# Patient Record
Sex: Female | Born: 1960 | Race: White | Marital: Single | Smoking: Current every day smoker
Health system: Southern US, Community
[De-identification: ages and names within clinical notes are randomized; demographics above are authoritative.]

## PROBLEM LIST (undated history)

## (undated) DIAGNOSIS — I252 Old myocardial infarction: Secondary | ICD-10-CM

## (undated) DIAGNOSIS — Z720 Tobacco use: Secondary | ICD-10-CM

## (undated) DIAGNOSIS — I351 Nonrheumatic aortic (valve) insufficiency: Secondary | ICD-10-CM

## (undated) DIAGNOSIS — E079 Disorder of thyroid, unspecified: Secondary | ICD-10-CM

## (undated) DIAGNOSIS — J439 Emphysema, unspecified: Secondary | ICD-10-CM

## (undated) DIAGNOSIS — M1990 Unspecified osteoarthritis, unspecified site: Secondary | ICD-10-CM

## (undated) DIAGNOSIS — I255 Ischemic cardiomyopathy: Secondary | ICD-10-CM

## (undated) DIAGNOSIS — R51 Headache: Secondary | ICD-10-CM

## (undated) DIAGNOSIS — E785 Hyperlipidemia, unspecified: Secondary | ICD-10-CM

## (undated) DIAGNOSIS — I739 Peripheral vascular disease, unspecified: Secondary | ICD-10-CM

## (undated) DIAGNOSIS — J189 Pneumonia, unspecified organism: Secondary | ICD-10-CM

## (undated) DIAGNOSIS — C3490 Malignant neoplasm of unspecified part of unspecified bronchus or lung: Secondary | ICD-10-CM

## (undated) DIAGNOSIS — R011 Cardiac murmur, unspecified: Secondary | ICD-10-CM

## (undated) DIAGNOSIS — J449 Chronic obstructive pulmonary disease, unspecified: Secondary | ICD-10-CM

## (undated) DIAGNOSIS — I2510 Atherosclerotic heart disease of native coronary artery without angina pectoris: Principal | ICD-10-CM

## (undated) HISTORY — DX: Atherosclerotic heart disease of native coronary artery without angina pectoris: I25.10

## (undated) HISTORY — DX: Cardiac murmur, unspecified: R01.1

## (undated) HISTORY — DX: Malignant neoplasm of unspecified part of unspecified bronchus or lung: C34.90

## (undated) HISTORY — PX: CORONARY ANGIOPLASTY WITH STENT PLACEMENT: SHX49

## (undated) HISTORY — PX: CHOLECYSTECTOMY: SHX55

## (undated) HISTORY — PX: SHOULDER SURGERY: SHX246

## (undated) HISTORY — DX: Peripheral vascular disease, unspecified: I73.9

## (undated) HISTORY — DX: Headache: R51

## (undated) HISTORY — DX: Tobacco use: Z72.0

## (undated) HISTORY — DX: Ischemic cardiomyopathy: I25.5

## (undated) HISTORY — PX: TUBAL LIGATION: SHX77

## (undated) HISTORY — DX: Old myocardial infarction: I25.2

## (undated) HISTORY — DX: Hyperlipidemia, unspecified: E78.5

## (undated) HISTORY — PX: OTHER SURGICAL HISTORY: SHX169

## (undated) HISTORY — DX: Disorder of thyroid, unspecified: E07.9

## (undated) HISTORY — DX: Nonrheumatic aortic (valve) insufficiency: I35.1

---

## 1998-11-10 ENCOUNTER — Other Ambulatory Visit: Admission: RE | Admit: 1998-11-10 | Discharge: 1998-11-10 | Payer: Self-pay | Admitting: Obstetrics & Gynecology

## 1998-12-30 ENCOUNTER — Other Ambulatory Visit: Admission: RE | Admit: 1998-12-30 | Discharge: 1998-12-30 | Payer: Self-pay | Admitting: Obstetrics & Gynecology

## 1999-06-03 ENCOUNTER — Emergency Department (HOSPITAL_COMMUNITY): Admission: EM | Admit: 1999-06-03 | Discharge: 1999-06-03 | Payer: Self-pay | Admitting: Emergency Medicine

## 2004-06-09 ENCOUNTER — Ambulatory Visit (HOSPITAL_BASED_OUTPATIENT_CLINIC_OR_DEPARTMENT_OTHER): Admission: RE | Admit: 2004-06-09 | Discharge: 2004-06-09 | Payer: Self-pay | Admitting: Orthopedic Surgery

## 2005-01-19 ENCOUNTER — Ambulatory Visit (HOSPITAL_COMMUNITY): Admission: RE | Admit: 2005-01-19 | Discharge: 2005-01-19 | Payer: Self-pay | Admitting: Orthopedic Surgery

## 2005-01-19 ENCOUNTER — Ambulatory Visit (HOSPITAL_BASED_OUTPATIENT_CLINIC_OR_DEPARTMENT_OTHER): Admission: RE | Admit: 2005-01-19 | Discharge: 2005-01-19 | Payer: Self-pay | Admitting: Orthopedic Surgery

## 2006-07-13 ENCOUNTER — Encounter: Admission: RE | Admit: 2006-07-13 | Discharge: 2006-07-13 | Payer: Self-pay | Admitting: Obstetrics and Gynecology

## 2007-10-15 ENCOUNTER — Inpatient Hospital Stay (HOSPITAL_COMMUNITY): Admission: EM | Admit: 2007-10-15 | Discharge: 2007-10-21 | Payer: Self-pay | Admitting: Family Medicine

## 2007-10-15 HISTORY — PX: CARDIAC CATHETERIZATION: SHX172

## 2007-10-17 ENCOUNTER — Encounter: Payer: Self-pay | Admitting: Cardiovascular Disease

## 2007-10-17 HISTORY — PX: TRANSTHORACIC ECHOCARDIOGRAM: SHX275

## 2007-12-29 DIAGNOSIS — I252 Old myocardial infarction: Secondary | ICD-10-CM

## 2007-12-29 HISTORY — DX: Old myocardial infarction: I25.2

## 2008-01-07 HISTORY — PX: US ECHOCARDIOGRAPHY: HXRAD669

## 2009-11-09 ENCOUNTER — Inpatient Hospital Stay (HOSPITAL_COMMUNITY): Admission: EM | Admit: 2009-11-09 | Discharge: 2009-11-09 | Payer: Self-pay | Admitting: Emergency Medicine

## 2009-11-09 ENCOUNTER — Ambulatory Visit: Payer: Self-pay | Admitting: Infectious Diseases

## 2009-11-09 LAB — CONVERTED CEMR LAB
Free T4: 0.92 ng/dL
HDL: 42 mg/dL
Hgb A1c MFr Bld: 6 %
TSH: 1.814 microintl units/mL

## 2009-11-23 ENCOUNTER — Encounter: Payer: Self-pay | Admitting: Internal Medicine

## 2009-11-24 ENCOUNTER — Ambulatory Visit: Payer: Self-pay | Admitting: Internal Medicine

## 2009-11-24 DIAGNOSIS — E785 Hyperlipidemia, unspecified: Secondary | ICD-10-CM | POA: Insufficient documentation

## 2009-11-24 DIAGNOSIS — I2589 Other forms of chronic ischemic heart disease: Secondary | ICD-10-CM

## 2009-11-24 DIAGNOSIS — F17200 Nicotine dependence, unspecified, uncomplicated: Secondary | ICD-10-CM | POA: Insufficient documentation

## 2009-11-24 DIAGNOSIS — R112 Nausea with vomiting, unspecified: Secondary | ICD-10-CM | POA: Insufficient documentation

## 2009-11-24 DIAGNOSIS — I2510 Atherosclerotic heart disease of native coronary artery without angina pectoris: Secondary | ICD-10-CM | POA: Insufficient documentation

## 2009-11-24 DIAGNOSIS — I5032 Chronic diastolic (congestive) heart failure: Secondary | ICD-10-CM | POA: Insufficient documentation

## 2009-11-24 DIAGNOSIS — R5382 Chronic fatigue, unspecified: Secondary | ICD-10-CM

## 2009-11-24 HISTORY — DX: Atherosclerotic heart disease of native coronary artery without angina pectoris: I25.10

## 2009-11-26 ENCOUNTER — Telehealth: Payer: Self-pay | Admitting: Licensed Clinical Social Worker

## 2009-12-25 ENCOUNTER — Ambulatory Visit: Payer: Self-pay | Admitting: Internal Medicine

## 2009-12-25 LAB — CONVERTED CEMR LAB
ALT: 15 units/L (ref 0–35)
AST: 23 units/L (ref 0–37)
Alkaline Phosphatase: 84 units/L (ref 39–117)
HDL: 46 mg/dL (ref >39)
Total CHOL/HDL Ratio: 3.6
Total Protein: 6.7 g/dL (ref 6.0–8.3)
Triglycerides: 58 mg/dL (ref <150)

## 2010-06-29 NOTE — Miscellaneous (Signed)
Summary: PATIENT CONSENT  PATIENT CONSENT   Imported By: Louretta Parma 12/17/2009 10:28:29  _____________________________________________________________________  External Attachment:    Type:   Image     Comment:   External Document

## 2010-06-29 NOTE — Progress Notes (Signed)
Summary: Soc. Work  Nurse, children's placed by: Soc. Work Call placed to: Patient Summary of Call: Called patient to schedule an appmt for smoking cessation counseling.  The patient told me that her mother had already purchased the 21 mg nicotine patches for her to start and she plans to quit by tomorrow.   She tells me that she thinks she can do this on her own and doesn't need to come in for counseling.  I shared several tips with her, explained how the patches are usually dosed (but instructed her to read the Equate directions carefully) and encouraged her to connect with the quitline.   I advised her that counseling and the quitline would double her chance of success. The patient was not very receptive to my help and doesn't plan to call the quitline.  She thanked me for calling and told me she has my card and information.

## 2010-06-29 NOTE — Assessment & Plan Note (Signed)
Summary: new hfu-per dr vega/cfb   Vital Signs:  Patient profile:   50 year old female Height:      65.5 inches (166.37 cm) Weight:      141.2 pounds (64.18 kg) BMI:     23.22 Temp:     97.3 degrees F (36.28 degrees C) oral Pulse rate:   77 / minute BP sitting:   118 / 69  (right arm)  Vitals Entered By: Stanton Kidney Ditzler RN (November 24, 2009 3:47 PM) Is Patient Diabetic? No Pain Assessment Patient in pain? yes     Location: chest Intensity: 3 Type: sharp Onset of pain  off and on since in hospital Nutritional Status BMI of 19 -24 = normal Nutritional Status Detail appetite good  Have you ever been in a relationship where you felt threatened, hurt or afraid?denies   Does patient need assistance? Functional Status Self care Ambulation Normal Comments New pt and HFU - legs feels like water, tired and n/v.   History of Present Illness: Sharon [name] is a 50 yo woman with PMH as outlined below.  She is here for HFU for chest pain.  She reports symptoms have been present for over 1 month.  Main symptom is tired/fatigue.  She works night shift 10pm-6am.  Also having nausea for the last week, it waxes and wanes.  She has not noticed any association with activities such as eating although she has vomited after eating in light of already having nausea.    Feels depressed most days of the week, doesn't sleep well (<6 hrs), decreased appetite, decreased energy.  Has interests and enjoys games on internet, able to concentrate, no guilt or SI/HI.    All other systems reviewed and negative.     Depression History:      The patient denies a depressed mood most of the day and a diminished interest in her usual daily activities.         Preventive Screening-Counseling & Management  Alcohol-Tobacco     Smoking Status: current     Smoking Cessation Counseling: yes     Packs/Day: 1 ppd      Drug Use:  no.    Current Medications (verified): 1)  Plavix 75 Mg Tabs (Clopidogrel Bisulfate) ....  Take 1 Tablet By Mouth Once A Day 2)  Simvastatin 40 Mg Tabs (Simvastatin) .... Take 1 Tab By Mouth At Bedtime 3)  Aspir-Low 81 Mg Tbec (Aspirin) .... Take 1 Tablet By Mouth Once A Day 4)  Fish Oil 1200 Mg Caps (Omega-3 Fatty Acids) .... Take 1 Tablet By Mouth Once A Day 5)  Niacin 500 Mg Tabs (Niacin) .... Take 1 Tablet By Mouth Once A Day 6)  Vitamin B-12 1000 Mcg Tabs (Cyanocobalamin) .... Take 1 Tablet By Mouth Once A Day  Allergies (verified): 1)  ! Codeine  Past History:  Past Medical History: Ischemic Cardiomyopathy      -EF 30-40% per LVgram 09/2007      -LVF moderately reduced per echo 09/2007      -LHC 09/2007:  s/p PCI with Promus DES x 2 and monorail to proximal LAD, totally occluded prox RCA with                                     collaterals.      -Dr. Elease Hashimoto:  cardiologist (f/u 12/03/09).  Hyperlipidemia      -LDL 201 10/2009  Tobacco  use      -interested in quiting 10/2009, going to attempt with nicotine patch  Past Surgical History: Laparoscopic cholecystectomy 2007 Multiple ectopic pregnancies requiring laparotomies Tubal ligation  Family History: Mother:  alive 21yo, DM, HTN, HLD Father:  deceased 77yo, esophageal CA 2/2 EtOH Older sister:  obesity with multiple med problems younger sister:  deceased 24yo, breast CA??? 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   Head:  normocephalic and atraumatic.   Eyes:  pupils equal, pupils round, and pupils reactive to light.  anicteric Neck:  supple, no thyromegaly, no JVD, and no carotid bruits.  audible systolic murmur bilaterally Lungs:  normal respiratory effort, no accessory muscle use,  normal breath sounds, no crackles, and no wheezes.   Heart:  normal rate, regular rhythm, no gallop, no rub, no JVD, and no HJR.  There is a grade 3/6 systolic murmur over outflow tract. Abdomen:  soft, non-tender, normal bowel sounds, no distention, no masses, no guarding, no hepatomegaly, and no splenomegaly.   Extremities:  no CCE Neurologic:  alert & oriented X3, cranial nerves II-XII intact, strength normal in all extremities, and gait normal.   Psych:  Oriented X3, memory intact for recent and remote, and normally interactive.  Easily tearful when talking about daughter moving to [place] 1 year ago.  Slightly depressed mood.   Impression & Recommendations:  Problem # 1:  CORONARY ARTERY DISEASE, S/P PTCA (ICD-414.9) See PMH On ASA and plavix.....s/p DES 2009 D/c summary states pravachol due to cost, however, pt taking simvastatin 40mg ....LDL 201 in setting of CAD....Marland KitchenMarland Kitchen will change to crestor 40mg  as pt will be able to aquire after lengthy discussion with little or no cost.....also advised to check with MAP. Will also start low dose B-blocker in setting of prior PTCA and totally occluded RCA  Her updated medication list for this problem includes:    Plavix 75 Mg Tabs (Clopidogrel bisulfate) .Marland Kitchen... Take 1 tablet by mouth once a day    Aspir-low 81 Mg Tbec (Aspirin) .Marland Kitchen... Take 1 tablet by mouth once a day    Metoprolol Tartrate 25 Mg Tabs (Metoprolol tartrate) .Marland Kitchen... Take 1/2 tablet by mouth twice a day  Problem # 2:  CARDIOMYOPATHY, ISCHEMIC (ICD-414.8) Currently euvolemic and well perfused physiologic state Will start low dose B-blocker To follow with Dr. Elease Hashimoto on 12/03/09 Assume she will need a new 2D echo Can consider titrating metoprolol up and/or addition of ACE-I if EF remains below 40%. Should also be considered for AICD if EF persists despite optimal medical treatment.  Her updated medication list for this problem includes:    Plavix 75 Mg Tabs (Clopidogrel bisulfate) .Marland Kitchen... Take 1  tablet by mouth once a day    Aspir-low 81 Mg Tbec (Aspirin) .Marland Kitchen... Take 1 tablet by mouth once a day    Metoprolol Tartrate 25 Mg Tabs (Metoprolol tartrate) .Marland Kitchen... Take 1/2 tablet by mouth twice a day  Problem # 3:  HYPERLIPIDEMIA (ICD-272.4) D/c summary states pravachol due to cost, however, pt taking simvastatin 40mg .Marland Kitchen..LDL 201 in setting of CAD....Marland KitchenMarland Kitchen will change to crestor 40mg  as pt will be able to aquire after lengthy discussion with little or  no cost.....also advised to check with MAP.  Her updated medication list for this problem includes:    Crestor 40 Mg Tabs (Rosuvastatin calcium) .Marland Kitchen... Take 1 tab by mouth at bedtime    Niacin 500 Mg Tabs (Niacin) .Marland Kitchen... Take 1 tablet by mouth once a day  Problem # 4:  TOBACCO ABUSE (ICD-305.1)  Pt willing to quit Will put in referral for SW to assist Willing to try nicotine patch Discussed medications available if need be  Orders: Social Work Referral (Social )  Problem # 5:  NAUSEA AND VOMITING (ICD-787.01) Mainly nausea with vomiting only if eating while nauseated CMP wnl in hospital s/p lap chole no other concerning findings including normal Hgb in hospital likely gastritis.Marland Kitchen...will try H2 blocker empirically  Problem # 6:  FATIGUE (ICD-780.79) Unclear etiology Hgb and TSH wnl in hospital Appears to have depressed mood but does not meet DSMIV criteria  Will refer to SW for further discussion  Complete Medication List: 1)  Plavix 75 Mg Tabs (Clopidogrel bisulfate) .... Take 1 tablet by mouth once a day 2)  Crestor 40 Mg Tabs (Rosuvastatin calcium) .... Take 1 tab by mouth at bedtime 3)  Aspir-low 81 Mg Tbec (Aspirin) .... Take 1 tablet by mouth once a day 4)  Fish Oil 1200 Mg Caps (Omega-3 fatty acids) .... Take 1 tablet by mouth once a day 5)  Niacin 500 Mg Tabs (Niacin) .... Take 1 tablet by mouth once a day 6)  Vitamin B-12 1000 Mcg Tabs (Cyanocobalamin) .... Take 1 tablet by mouth once a day 7)  Metoprolol Tartrate 25 Mg Tabs  (Metoprolol tartrate) .... Take 1/2 tablet by mouth twice a day 8)  Ranitidine Hcl 150 Mg Caps (Ranitidine hcl) .... Take 1 tablet by mouth two times a day  Patient Instructions: 1)  Please schedule a follow-up appointment in 1 month. 2)  Will change simvastatin to crestor 40mg  at bedtime as prescribed. 3)  Will start ranitidine 150mg  two times a day for nausea. 4)  Will start metoprolol as prescribed below for your heart. 5)  Make sure to follow up with Dr. Elease Hashimoto. 6)  Remember to call Dorothe Pea. 7)  Make sure to let us know if there is anything we can do to assist you in quiting tobacco use. 8)  If you have any problems before your next visit, call clinic.  Prescriptions: PLAVIX 75 MG TABS (CLOPIDOGREL BISULFATE) Take 1 tablet by mouth once a day  #30 x 3   Entered and Authorized by:   Mariea Stable MD   Signed by:   Mariea Stable MD on 11/24/2009   Method used:   Print then Give to Patient   RxID:   [id number] RANITIDINE HCL 150 MG CAPS (RANITIDINE HCL) Take 1 tablet by mouth two times a day  #60 x 3   Entered and Authorized by:   Mariea Stable MD   Signed by:   Mariea Stable MD on 11/24/2009   Method used:   Print then Give to Patient   RxID:   [id number] METOPROLOL TARTRATE 25 MG TABS (METOPROLOL TARTRATE) Take 1/2 tablet by mouth twice a day  #60 x 3   Entered and Authorized by:   Mariea Stable MD   Signed by:   Mariea Stable MD on 11/24/2009   Method used:   Print then Give to Patient   RxID:   [id number] CRESTOR 40 MG TABS (ROSUVASTATIN CALCIUM) Take 1 tab by mouth at bedtime  #30 x 3  Entered and Authorized by:   Mariea Stable MD   Signed by:   Mariea Stable MD on 11/24/2009   Method used:   Print then Give to Patient   RxID:   [id number]

## 2010-06-29 NOTE — Assessment & Plan Note (Signed)
Summary: RA/NEEDS 1 MONTH F/U VISIT/CH   Vital Signs:  Sharon [name] profile:   50 year old female Height:      65.5 inches (166.37 cm) Weight:      143.2 pounds (65.09 kg) BMI:     23.55 Temp:     97.5 degrees F (36.39 degrees C) oral Pulse rate:   68 / minute BP sitting:   118 / 73  (right arm)  Vitals Entered By: Stanton Kidney Ditzler RN (December 25, 2009 9:51 AM) Is Sharon [name] Diabetic? No Pain Assessment Sharon [name] in pain? no      Nutritional Status BMI of 19 -24 = normal Nutritional Status Detail appetite good  Have you ever been in a relationship where you felt threatened, hurt or afraid?denies   Does Sharon [name] need assistance? Functional Status Self care Ambulation Normal Comments FU - no money for Plavix - last one 12/23/09.   Primary Care Provider:  Leodis Sias MD   History of Present Illness: Sharon [name] is a 50 year old female who presents today for a follow up from her last appointment a month ago.  She was hospitalized in 11/09/09 for chest pain that was thought to possibly be unstable angina.  CE and EKG showed no signs of cardiac problems.  She has a history of CAD with DES placed in 09/2007.  Her main issue today is a problem with affording her Plavix.  She ran out on 12/23/09 and can not afford it because she does not have insurance.  She was seen by Dr. Shaune Pascal on 12/03/09 and he suggested continuing her Plavix indefinately.  She has not had any chest pain, nausea, vomiting, bleeding episodes, stool changes, or blood in the stool.    At her last visit she also was complaining of nausea and was started on Zantac 150 mg and since taking that she has had no nausea.  She denies any blood in the stool, regurgitation, or abdominal pain.  We also discussed smoking cessation and caffiene decrease.  She is down to 1/3 of a pack of cigarettes a day and also 1 cup of coffee per day.  She has had some increased irritability but denies racing thoughts, increased worry, sleep problems,  depressed mood, decreased interest, decreased concentration.  Depression History:      The Sharon [name] denies a depressed mood most of the day and a diminished interest in her usual daily activities.  Positive alarm features for depression include psychomotor agitation.  However, she denies significant weight loss, significant weight gain, insomnia, hypersomnia, psychomotor retardation, fatigue (loss of energy), feelings of worthlessness (guilt), impaired concentration (indecisiveness), and recurrent thoughts of death or suicide.        Psychosocial stress factors include major life changes.  The Sharon [name] denies that she feels like life is not worth living, denies that she wishes that she were dead, and denies that she has thought about ending her life.        Comments:  She is working to quit cigarettes and has noticed some agitation. .   Preventive Screening-Counseling & Management  Alcohol-Tobacco     Smoking Status: current     Smoking Cessation Counseling: yes     Packs/Day: 1/3 ppd  -  Date:  11/28/2006    DTP Given for Work as PCA  Current Medications (verified): 1)  Plavix 75 Mg Tabs (Clopidogrel Bisulfate) .... Take 1 Tablet By Mouth Once A Day 2)  Crestor 40 Mg Tabs (Rosuvastatin Calcium) .... Take 1 Tab By Mouth At  Bedtime 3)  Aspir-Low 81 Mg Tbec (Aspirin) .... Take 1 Tablet By Mouth Once A Day 4)  Fish Oil 1200 Mg Caps (Omega-3 Fatty Acids) .... Take 1 Tablet By Mouth Once A Day 5)  Niacin 500 Mg Tabs (Niacin) .... Take 1 Tablet By Mouth Once A Day 6)  Vitamin B-12 1000 Mcg Tabs (Cyanocobalamin) .... Take 1 Tablet By Mouth Once A Day 7)  Metoprolol Tartrate 25 Mg Tabs (Metoprolol Tartrate) .... Take 1/2 Tablet By Mouth Twice A Day 8)  Ranitidine Hcl 150 Mg Caps (Ranitidine Hcl) .... Take 1 Tablet By Mouth Two Times A Day  Allergies: 1)  ! Codeine  Past History:  Past medical, surgical, family and social histories (including risk factors) reviewed for relevance to current  acute and chronic problems.  Past Medical History: Reviewed history from 11/24/2009 and no changes required. Ischemic Cardiomyopathy      -EF 30-40% per LVgram 09/2007      -LVF moderately reduced per echo 09/2007      -LHC 09/2007:  s/p PCI with Promus DES x 2 and monorail to proximal LAD, totally occluded prox RCA with                                     collaterals.      -Dr. Elease Hashimoto:  cardiologist (f/u 12/03/09).  Hyperlipidemia      -LDL 201 10/2009  Tobacco use      -interested in quiting 10/2009, going to attempt with nicotine patch  Past Surgical History: Reviewed history from 11/24/2009 and no changes required. Laparoscopic cholecystectomy 2007 Multiple ectopic pregnancies requiring laparotomies Tubal ligation  Family History: Reviewed history from 11/24/2009 and no changes required. Mother:  alive 42yo, DM, HTN, HLD Father:  deceased 62yo, esophageal CA 2/2 EtOH Older sister:  obesity with multiple med problems younger sister:  deceased 14yo, breast CA??? 1 daughter:  healthy  Social History: Reviewed history from 11/24/2009 and no changes required. 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  Vital signs reviewed.  Head: Atraumatic, normocephalic with no signs of trauma  Eyes: PERRLA, EOM intact  Ears: TM intact  Nose: Nares patent, mucosa is pink and moist, no polyps noted  Mouth: Mucosa is pink and moist, dention,   Neck: Supple, full ROM, no thyromegaly or masses noted  Resp: Clear to ascultation bilaterally, no wheezes, rales, or rhonchi noted  CV: Regular rate and rhythm with no murmurs, rubs, or gallops noted  Abdomen: Soft, non-tender, non-distended with normal bowel sounds  Musculoskelatal: ROM full with intact strength, no  pain to palpation  Neurologic: Alert and oriented x3, Cranial nerves II-XII grossly intact, DTR normal and symmetric  Pulses: Radial, brachial, carotid, femoral, dorsal pedis, and posterior tibial pulses equal and symmetric     Impression & Recommendations:  Problem # 1:  CORONARY ARTERY DISEASE, S/P PTCA (ICD-414.9) We will recheck her FLP today since she was adjusted on the Crestor 4 weeks ago to see if she is more controlled.  We will give her a sample of Plavix and she will go to the MAP program and work on getting the prescription through them.  She  will also contact her Cardiologist office to see if they have samples to give her to get through until the MAP program gets the medicine in.  We will also request the records from Dr.Nasher's visit for our records.   Her updated medication list for this problem includes:    Plavix 75 Mg Tabs (Clopidogrel bisulfate) .Marland Kitchen... Take 1 tablet by mouth once a day    Aspir-low 81 Mg Tbec (Aspirin) .Marland Kitchen... Take 1 tablet by mouth once a day    Metoprolol Tartrate 25 Mg Tabs (Metoprolol tartrate) .Marland Kitchen... Take 1/2 tablet by mouth twice a day  Labs Reviewed: Chol: 255 (11/09/2009)   HDL: 42 (11/09/2009)   LDL: 201 (11/09/2009)   TG: 59 (11/09/2009)  Problem # 2:  NAUSEA AND VOMITING (ICD-787.01) This has improved on the Zantac.  We will continue that for now.  Problem # 3:  TOBACCO ABUSE (ICD-305.1) I encouraged her to continue her efforts and congratulated her on her start.  Several ideas and methods for cutting back even more were discussed.  Problem # 4:  HYPERLIPIDEMIA (ICD-272.4)  See Problem #1 for plan.    Her updated medication list for this problem includes:    Crestor 40 Mg Tabs (Rosuvastatin calcium) .Marland Kitchen... Take 1 tab by mouth at bedtime    Niacin 500 Mg Tabs (Niacin) .Marland Kitchen... Take 1 tablet by mouth once a day  Orders: T-Lipid Profile ([id number]) T-TSH [id number] [phone])  Problem # 5:  Preventive Health Care (ICD-V70.0) We will hold off her  preventative care items such as mammogram, pap, and Tdap etc. until she gets her orange card worked out from [employer].  Complete Medication List: 1)  Plavix 75 Mg Tabs (Clopidogrel bisulfate) .... Take 1 tablet by mouth once a day 2)  Crestor 40 Mg Tabs (Rosuvastatin calcium) .... Take 1 tab by mouth at bedtime 3)  Aspir-low 81 Mg Tbec (Aspirin) .... Take 1 tablet by mouth once a day 4)  Fish Oil 1200 Mg Caps (Omega-3 fatty acids) .... Take 1 tablet by mouth once a day 5)  Niacin 500 Mg Tabs (Niacin) .... Take 1 tablet by mouth once a day 6)  Vitamin B-12 1000 Mcg Tabs (Cyanocobalamin) .... Take 1 tablet by mouth once a day 7)  Metoprolol Tartrate 25 Mg Tabs (Metoprolol tartrate) .... Take 1/2 tablet by mouth twice a day 8)  Ranitidine Hcl 150 Mg Caps (Ranitidine hcl) .... Take 1 tablet by mouth two times a day  Other Orders: T-Hepatic Function ([id number])  Sharon [name] Instructions: 1)  Contact the MAP program for assistance obtaining your Plavix. 2)  Continue your other medications as prescribed.   3)  I will call with abnormal test results  4)  Please schedule a follow-up appointment in 3 months. Process Orders Check Orders Results:     [facility]: ABN not required for this insurance Tests Sent for requisitioning (December 25, 2009 10:37 PM):     12/25/2009: [facility] -- T-Hepatic Function [phone] (signed)     12/25/2009: [facility] -- T-Lipid Profile [phone] (signed)     12/25/2009: [facility] -- T-TSH [phone] (signed)     Prevention & Chronic Care Immunizations   Influenza vaccine: Not documented   Influenza vaccine deferral: Not available  (12/25/2009)    Tetanus booster: Not documented    Pneumococcal vaccine: Not documented  Other Screening   Pap smear: Not documented    Mammogram: Not documented   Smoking status: current  (12/25/2009)   Smoking  cessation counseling: yes   (12/25/2009)  Lipids   Total Cholesterol: 255  (11/09/2009)   Lipid panel action/deferral: Lipid Panel ordered   LDL: 201  (11/09/2009)   LDL Direct: Not documented   HDL: 42  (11/09/2009)   Triglycerides: 59  (11/09/2009)    SGOT (AST): Not documented   BMP action: Ordered   SGPT (ALT): Not documented   Alkaline phosphatase: Not documented   Total bilirubin: Not documented    Lipid flowsheet reviewed?: Yes   Progress toward LDL goal: Unchanged  Self-Management Support :   Personal Goals (by the next clinic visit) :      Personal LDL goal: 100  (12/25/2009)    Sharon [name] will work on the following items until the next clinic visit to reach self-care goals:     Medications and monitoring: take my medicines every day, check my blood pressure, bring all of my medications to every visit, weigh myself weekly  (12/25/2009)     Eating: eat more vegetables, use fresh or frozen vegetables, eat baked foods instead of fried foods, eat fruit for snacks and desserts, limit or avoid alcohol  (12/25/2009)     Activity: take a 30 minute walk every day  (12/25/2009)    Lipid self-management support: Written self-care plan, Education handout, Resources for patients handout  (12/25/2009)   Lipid self-care plan printed.   Lipid education handout printed      Resource handout printed.

## 2010-08-16 LAB — COMPREHENSIVE METABOLIC PANEL
AST: 21 U/L (ref 0–37)
Alkaline Phosphatase: 72 U/L (ref 39–117)
BUN: 9 mg/dL (ref 6–23)
Calcium: 8.7 mg/dL (ref 8.4–10.5)
Creatinine, Ser: 0.62 mg/dL (ref 0.4–1.2)
GFR calc Af Amer: >60 mL/min (ref >60)
Glucose, Bld: 95 mg/dL (ref 70–99)
Total Bilirubin: 0.3 mg/dL (ref 0.3–1.2)

## 2010-08-16 LAB — BASIC METABOLIC PANEL
CO2: 22 mEq/L (ref 19–32)
Chloride: 104 mEq/L (ref 96–112)
Creatinine, Ser: 0.66 mg/dL (ref 0.4–1.2)
GFR calc Af Amer: >60 mL/min (ref >60)
GFR calc non Af Amer: >60 mL/min (ref >60)
Potassium: 3.5 mEq/L (ref 3.5–5.1)

## 2010-08-16 LAB — TROPONIN I: Troponin I: 0.02 ng/mL (ref 0.00–0.06)

## 2010-08-16 LAB — HEMOGLOBIN A1C
Hgb A1c MFr Bld: 6 % — ABNORMAL HIGH (ref <5.7)
Mean Plasma Glucose: 126 mg/dL — ABNORMAL HIGH (ref <117)

## 2010-08-16 LAB — LIPID PANEL
LDL Cholesterol: 201 mg/dL — ABNORMAL HIGH (ref 0–99)
VLDL: 12 mg/dL (ref 0–40)

## 2010-08-16 LAB — RAPID URINE DRUG SCREEN, HOSP PERFORMED
Amphetamines: NOT DETECTED
Opiates: NOT DETECTED
Tetrahydrocannabinol: NOT DETECTED

## 2010-08-16 LAB — POCT CARDIAC MARKERS
CKMB, poc: 1.5 ng/mL (ref 1.0–8.0)
Myoglobin, poc: 46.6 ng/mL (ref 12–200)
Troponin i, poc: <0.05 ng/mL (ref 0.00–0.09)

## 2010-08-16 LAB — CBC
MCHC: 34.4 g/dL (ref 30.0–36.0)
MCV: 96.8 fL (ref 78.0–100.0)
Platelets: 187 10*3/uL (ref 150–400)
RBC: 4.36 MIL/uL (ref 3.87–5.11)

## 2010-08-16 LAB — CK TOTAL AND CKMB (NOT AT ARMC)
CK, MB: 2.8 ng/mL (ref 0.3–4.0)
Total CK: 214 U/L — ABNORMAL HIGH (ref 7–177)

## 2010-08-16 LAB — TSH: TSH: 1.814 u[IU]/mL (ref 0.350–4.500)

## 2010-08-16 LAB — DIFFERENTIAL
Basophils Relative: 1 % (ref 0–1)
Neutro Abs: 5.3 10*3/uL (ref 1.7–7.7)

## 2010-08-16 LAB — CARDIAC PANEL(CRET KIN+CKTOT+MB+TROPI)
CK, MB: 2.6 ng/mL (ref 0.3–4.0)
CK, MB: 2.7 ng/mL (ref 0.3–4.0)
Relative Index: 1.3 (ref 0.0–2.5)
Total CK: 157 U/L (ref 7–177)
Troponin I: 0.02 ng/mL (ref 0.00–0.06)

## 2010-10-12 NOTE — Cardiovascular Report (Signed)
Sharon [name] NO.:  [id number]   MEDICAL RECORD NO.:  [id number]          PATIENT TYPE:  OIB   LOCATION:  2807                         FACILITY:  [facility]   PHYSICIAN:  Vesta Mixer, M.D. DATE OF BIRTH:  1960-08-29   DATE OF PROCEDURE:  10/15/2007  DATE OF DISCHARGE:                            CARDIAC CATHETERIZATION   Sharon [name] is a 50 year old female with no significant past medical  history.  She has long history of cigarette smoke.  She is admitted with  acute anterior wall myocardial infarction.   The patient originally presented with a headache and diaphoresis and  severe weakness.  She told EMS she did have some chest tightness.  She  was picked up by [facility] and was found to have ST-segment  elevation.   PROCEDURE:  Left heart catheterization with coronary angiography with  PTCA and stenting of the left anterior descending artery.   The left femoral artery was cannulated using the assistance of a Smart  needle.  At the end of case, the left femoral artery angiogram revealed  that the vessel was satisfactory for closure.  We used an Angio-Seal  closure device in the standard technique.   Hemodynamic:  LV pressure is 105/30.  Aortic pressure is 108/73.   Angiography left main:  The left main is fairly normal.   The left anterior descending artery has a proximal 99% stenosis right at  the origin of first diagonal artery.  The first diagonal artery is a  branching vessel.  One of branch is occluded.  The other branch is  severely diseased.   The left circumflex artery is a large vessel.  The first obtuse marginal  artery has minor luminal irregularities and is somewhat tortuous.   Right coronary artery is occluded proximally.  This appears to be a  chronic occlusion.  There was collaterals from the left filling the  distal right.   The left ventriculogram was performed in 30 RAO position.  It revealed  anteroapical  dyskinesis.  There was mid anterior wall hypokinesis.  The  ejection fraction is around 30%-40%.   The left internal mammary artery was patent.  There was some minor  luminal irregularities in the left subclavian artery.   PTCA and stenting.  A Judkins 6-French left 3.5 guide was used to  cannulate the left main.  A Prowater angioplasty wire was used.  We give  her Angiomax and the ACT was 264.  She received 300 mg of p.o. Plavix.   The Prowater guide wire was placed down to the distal LAD.  A 3.0 x 12-  mm apex was positioned across the stenosis and was inflated on 2  occasions up to 6 atmospheres each.  A 3.0 x 18-mm Promus stent was  positioned across the stenosis and was inflated up to 10 atmospheres for  33 seconds.  This resulted in marked improvement of the vessel lumen but  with some distal disease.  A second stent (2.5 mm x 15-mm) Promus stent  was positioned just distal to the first and was deployed at 10  atmospheres.  A second balloon was pulled back after deployment and a  second inflation of 10 atmospheres was performed at the overlap site.   Poststent dilatation was achieved initially using a 2.75 x 15-mm Quantum  monorail.  It was positioned and the distal stent was inflated up to 12  atmospheres for 16 seconds.  It was then pulled back to the overlap  section and was inflated up to 18 atmospheres for 15 seconds.   This balloon was removed and a 3.25 x 15 mm Quantum monorail was  positioned in the proximal stent.  The stent was inflated up to 14  atmospheres for 20 seconds and then positioned in the overlap area and  again inflated up to 14 atmospheres for 20 seconds.  This resulted in a  very nice angiographic result.  There was brisk flow down the LAD.  The  diagonal vessel remained occluded.   The patient was stable leaving the lab.  She did complained of severe  headache.  The right femoral artery was sealed with Angio Seal because  of her coughing and inability  to stay still.           ______________________________  Vesta Mixer, M.D.     PJN/MEDQ  D:  10/15/2007  T:  10/16/2007  Job:  [id number]

## 2010-10-15 NOTE — Discharge Summary (Signed)
[name], [name] NO.:  [id number]   MEDICAL RECORD NO.:  [id number]          PATIENT TYPE:  INP   LOCATION:  2003                         FACILITY:  [facility]   PHYSICIAN:  Vesta Mixer, M.D. DATE OF BIRTH:  May 23, 1961   DATE OF ADMISSION:  10/15/2007  DATE OF DISCHARGE:  10/21/2007                               DISCHARGE SUMMARY   DISCHARGE DIAGNOSES:  1. Anterior wall myocardial infarction.  2. Congestive heart failure with apical dyskinesis and an ejection      fraction of around 30-35%.  3. Dyslipidemia.  4. Chronic obstructive pulmonary disease.   DISCHARGE MEDICATIONS:  1. Aspirin 81 mg a day.  2. Plavix 75 mg a day.  3. Crestor 10 mg a day.  4. Coumadin 5 mg a day and is otherwise directed by Dr. Elease Hashimoto.  5. Nitroglycerin as needed.   DISPOSITION:  The patient has been instructed not to smoke.  She will  see Dr. Elease Hashimoto in 1-2 weeks for followup visit.  She is to call us  sooner if she has any problems.   HISTORY:  [name] is a 50 year old female with a history of  cigarette smoking.  She was admitted on Oct 15, 2007, with acute onset  of chest pain and the EKG consistent with an anterior wall myocardial  infarction.  She was taken emergently to the cath lab for further  evaluation.   The patient had a heart catheterization, which revealed an occluded left  anterior descending artery.  She underwent successful PTCA using an Apex  balloon.  We later placed a 3.0 x 18-mm PROMUS stent deployed at 10  atmospheres for 3 seconds and then a 2.5 x 15 mm PROMUS stent inflated  up to 10 atmospheres.   Post-stent dilatation was achieved using a 2.75-mm x 15-mm Quantum  Monorail inflated up to 18 atmospheres within the 2.5-mm stent.  A 3.25-  mm x 15-mm Quantum Monorail was inflated up to 14 atmospheres in the 3-  mm stent.  This gave [place] a very nice angiographic result with nice flow.   The patient did fairly well.  She did have moderately reduced  left  ventricular systolic function with evidence of apical dyskinesis.  Ejection fraction was 30-35%.  Because of this, she will start on  Coumadin.  She was therapeutic on her Coumadin when she was discharged.   Her troponin peaked at 67.51 shortly after admission.   The patient has remained stable and was discharged home in satisfactory  condition.  She will follow up with Dr. Elease Hashimoto.  All of her other  medical problems are stable.      Vesta Mixer, M.D.  Electronically Signed     PJN/MEDQ  D:  02/27/2008  T:  02/27/2008  Job:  [id number]

## 2010-10-15 NOTE — Op Note (Signed)
NAMESORAYA, PAQUETTE               ACCOUNT NO.:  [id number]   MEDICAL RECORD NO.:  [id number]          PATIENT TYPE:  AMB   LOCATION:  DSC                          FACILITY:  [facility]   PHYSICIAN:  Feliberto Gottron. Turner Daniels, M.D.   DATE OF BIRTH:  [birth year]   DATE OF PROCEDURE:  01/19/2005  DATE OF DISCHARGE:                                 OPERATIVE REPORT   PREOPERATIVE DIAGNOSIS:  Left shoulder AC joint DJD.   POSTOPERATIVE DIAGNOSIS:  Left shoulder AC joint DJD, adhesive capsulitis.   PROCEDURE:  Left shoulder arthroscopic anterior inferior acromioplasty,  distal clavicle excision preceded by closed manipulation of the left  shoulder.   SURGEON:  Feliberto Gottron. Turner Daniels, M.D.   FIRST ASSISTANT:  None.   ANESTHETIC:  Interscalene block with general endotracheal.   ESTIMATED BLOOD LOSS:  Minimal.   FLUID REPLACEMENT:  Liter of crystalloid.   DRAINS PLACED:  None.   TOURNIQUET TIME:  None.   INDICATIONS FOR PROCEDURE:  The patient is a 50 year old woman who underwent  arthroscopic decompression of her left shoulder many months ago and has had  persistent pain only relieved by an [facility] joint injection. MRI scan repeat did  not show a rotator cuff tear and over the last few weeks it turns out she  has developed some adhesive capsulitis. Because of persistent pain over the  [facility] joint, she is taken for distal clavicle excision. Risks, benefits of  surgery discussed with the patient prior to surgery and all questions  answered.   DESCRIPTION OF PROCEDURE:  The patient identified by armband, taken to the  block room at [facility] day surgery center where the appropriate anesthetic  monitors were attached and left interscalene block anesthesia accomplished.  She was then taken to the operating room.  Again the appropriate anesthetic  monitors were attached and general endotracheal anesthesia induced. She was  then placed in the beach chair position and the left upper extremity prepped  and draped in the  usual sterile fashion from the wrist to the hemithorax.  Using a #11 blade standard portals were then made 1.5 cm anterior to the [facility]  joint, lateral to the junction of middle and posterior thirds acromion,  posterior to the posterolateral corner of the acromion process. The inflow  cannula was placed anteriorly. The arthroscope laterally and a 4.2 great  white sucker shaver posteriorly. Prior to placing the incisions we noted the  patient was quite stiff regarding flexion of only 100 degrees and external  rotation of 20. A closed manipulation was accomplished getting her flexion  up to 150 and external rotation to 60 and some soft gentle pops of scar  tissue were noted as the adhesions were released.  We then placed the scope  portals as previously dictated, inserted the inflow cannula anteriorly, the  arthroscope laterally and a 4.2 great white sucker shaver posteriorly and  removed recurrent bursal tissue. We identified the [facility] joint and removed the  inferior distal 1 cm with a 4.5 hooded vortex bur and then swapped portals  bringing the scope in posteriorly, the inflow laterally and a  4.5 hooded  vortex bur anteriorly completing the distal clavicle excision and also  revisiting the anterior inferior acromioplasty, making it coplanar with the  distal clavicle. We then examined the external leaflets of the rotator cuff  and found them to be intact. The arthroscope was then repositioned into the  glenohumeral joint using the posterior portal where diagnostic arthroscopy  revealed inflammation of the rotator cuff and biceps anchor, but overall the  structures were intact as was the articular cartilage of the glenohumeral  joint at this point the scope portals were irrigated out with normal saline  solution. The arthroscopic instruments were removed and a dressing of  Xeroform, 4 x 4 dressing sponges and Hypafix tape applied. The patient was  then laid supine, awakened and taken to the recovery  room without  difficulty.      Feliberto Gottron. Turner Daniels, M.D.  Electronically Signed     FJR/MEDQ  D:  01/19/2005  T:  01/19/2005  Job:  [id number]

## 2010-10-15 NOTE — Discharge Summary (Signed)
NAMESAGAN, WURZEL NO.:  [id number]   MEDICAL RECORD NO.:  [id number]          PATIENT TYPE:  INP   LOCATION:  2003                         FACILITY:  [facility]   PHYSICIAN:  Vesta Mixer, M.D. DATE OF BIRTH:  16-Dec-1960   DATE OF ADMISSION:  10/15/2007  DATE OF DISCHARGE:  10/21/2007                               DISCHARGE SUMMARY   DISCHARGE DIAGNOSES:  1. Acute anterior wall myocardial infarction - status post      percutaneous transluminal coronary angioplasty and stenting of the      proximal left anterior descending.  2. Left ventricular dysfunction with an ejection fraction of 30-40%.  3. Coumadin therapy for her segmental wall motion abnormalities.  4. Hypoglycemia.  5. Hyperlipidemia.   DISCHARGE MEDICATIONS:  1. Aspirin 81 mg daily.  2. Plavix 75 mg daily.  3. Crestor 10 mg daily.  4. Coumadin 5 mg daily.  5. Nitroglycerin 0.4 mg sublingually as needed.   DISPOSITION:  The patient will see Dr. Elease Hashimoto in approximately 1-2  weeks.  She has been instructed to stop smoking.   HISTORY:  Sharon [name] is a 50 year old female who was admitted with an  acute anterior wall myocardial infarction.  Please see dictated H&P for  further details.   HOSPITAL COURSE:  Coronary artery disease.  The patient was admitted  with an acute ST-segment elevation in the anterior leads.  She was taken  emergently to the cath lab where she was found to have an occluded left  anterior descending artery.  She underwent successful PTCA and stenting  of the proximal left anterior descending artery using a 3.0 x 18 mm  Promus, deployed at 10 atmospheres followed by a 2.5 mm x 15 mm Promus.  Post-stent dilatation was achieved using a 2.75 mm and a 3.25 mm Quantum  monorail and a 3.0 mm stent.  The patient tolerated the procedure quite  well and did not have any further episodes of chest pain.   The patient was seen by cardiac rehab and ambulated quite well.  She had  an  echocardiogram, which revealed akinesis of the anterior wall, which  was not significantly changed from left heart catheterization.  The  patient did well and did not have any further episodes of chest pain.  Her INR was 2.3 on the date of discharge.  She will follow up with Dr.  Elease Hashimoto.  All of her other medical problems remained stable.           ______________________________  Vesta Mixer, M.D.    PJN/MEDQ  D:  12/24/2007  T:  12/25/2007  Job:  [id number]

## 2010-10-15 NOTE — Op Note (Signed)
Sharon [name], Sharon [name]               ACCOUNT NO.:  [id number]   MEDICAL RECORD NO.:  [id number]          PATIENT TYPE:  AMB   LOCATION:  DSC                          FACILITY:  [facility]   PHYSICIAN:  Feliberto Gottron. Turner Daniels, M.D.   DATE OF BIRTH:  [birth year]   DATE OF PROCEDURE:  06/09/2004  DATE OF DISCHARGE:                                 OPERATIVE REPORT   PREOPERATIVE DIAGNOSIS:  Left shoulder impingement syndrome.   POSTOPERATIVE DIAGNOSIS:  Left shoulder impingement syndrome.   PROCEDURE:  Left shoulder arthroscopic anterior inferior acromioplasty.   SURGEON:  Feliberto Gottron. Turner Daniels, M.D.   ASSISTANTLaural Benes. Jannet Mantis.   ANESTHETIC:  General endotracheal.   ESTIMATED BLOOD LOSS:  Minimal.   FLUID REPLACEMENT:  800 cc crystalloid.   DRAINS PLACED:  None.   TOURNIQUET TIME:  None.   INDICATIONS FOR PROCEDURE:  A 50 year old woman injured at work, who has had  persistent the left shoulder pain.  She was treated by a physician in  [place], worked up with a cortisone injection that provided temporary  relief, had an ultrasound shoulder showing a partial thickness rotator cuff  tear and has now failed all conservative measures including physical  therapy. She desires elective arthroscopic evaluation and treatment of the  left shoulder, has a type 3 subacromial spur about a centimeter in size. The  [facility] joint while being somewhat prominent, has no overt arthritis although  cross chest adduction test is slightly positive, not near as positive as the  flexion internal rotation sign.   DESCRIPTION OF PROCEDURE:  The patient identified by armband, taken the  operating room at [facility].  Appropriate anesthetic  monitors were attached and general endotracheal anesthesia induced with the  patient in supine position. She was then placed in the beach chair position,  left upper extremity prepped and draped in a sterile fashion from the wrist  to the hemithorax.  Using a #11  blade, standard portals were then made 1.5  cm anterior to the [facility] joint, lateral to the junction of the middle and  posterior thirds of the acromion process and posterior the posterolateral  corner of the acromion process.  The inflow was placed anteriorly. The  arthroscope laterally a 4.2 Great White sucker shaver posteriorly allowing  subacromial bursectomy revealing a fairly large type 3 subacromial spur and  intact rotator cuff although it was irritated and inflamed.  Small bleeders  were identified and cauterized with the underwater electrocautery and hook  Bovie and the subacromial spur was then removed with a 4.5 hooded vortex bur  utilizing the posterior portal. The AC joint was carefully examined. There  was intact good looking cartilage proximally and distally and therefore no  AC joint resection was entertained. The arthroscope was then repositioned  into the glenohumeral joint using the posterior portal where we confirmed  good articular cartilage, good rotator cuff integrity, biceps tendon and  anchor integrity, and essentially a normal appearance of the glenohumeral  joint. The shoulder was then irrigated out with normal saline solution. The  arthroscopic instruments removed and  a dressing of Xeroform, 4x4 dressing  sponges, paper tape and a shoulder immobilizer applied. The patient was then  laid supine, awakened and taken to the recovery room without difficulty.      Emilio Aspen  D:  06/09/2004  T:  06/09/2004  Job:  [id number]

## 2010-10-15 NOTE — H&P (Signed)
Sharon [name], PENMAN NO.:  [id number]   MEDICAL RECORD NO.:  [id number]           PATIENT TYPE:   LOCATION:                                 FACILITY:   PHYSICIAN:  Vesta Mixer, M.D.      DATE OF BIRTH:   DATE OF ADMISSION:  10/15/2007  DATE OF DISCHARGE:                              HISTORY & PHYSICAL   Sharon [name] is a 50 year old female with a history of cigarette  smoking.  She has not had any past medical history.  She is admitted  with an acute ST-segment elevation myocardial infarction of the anterior  wall.   The patient presented originally with headache and diaphoresis as well  as severe weakness.  She told the EMS that she had some chest tightness.  She was picked up by [facility] and was found to have ST-  segment elevation in the anterior leads.   She was brought emergently to [facility] where she was found to  have findings consistent with an anterior wall myocardial infarction.   MEDICATIONS:  None.   ALLERGIES:  None.   PAST MEDICAL HISTORY:  None.   FAMILY HISTORY:  Unobtainable due to the urgency of the admission.   REVIEW OF SYSTEMS:  The patient denies any previous episodes of chest  pain or shortness of breath.  There is no heat or cold intolerance.  There is no weight gain or weight loss.  All other systems were  negative.   SOCIAL HISTORY:  The patient smokes a pack of cigarettes a day.   PHYSICAL EXAMINATION:  GENERAL:  She is a middle-aged female in mild  distress.  She continues to complain of some chest pain.  VITAL SIGNS:  Blood pressures is 108/73.  HEENT:  2+ carotids.  She has no bruits.  Sclerae are nonicteric.  Her  mucous membranes are moist.  NECK:  There is no JVD, no thyromegaly.  Neck is supple.  LUNGS:  Clear.  HEART:  Regular rate, S1 and S2.  PMI is nondisplaced.  ABDOMEN:  Good bowel sounds and is nontender.  EXTREMITIES:  She has no clubbing, cyanosis, or edema.  Pulses are  intact.  Her exam is nonfocal.   EKG reveals normal sinus rhythm.  She has ST-segment elevation in the  anterior leads.   IMPRESSION AND PLAN:  The patient presents with an anterior wall  myocardial infarction.  She will be taken emergently to the cath lab for  emergent heart catheterization.      Vesta Mixer, M.D.  Electronically Signed     PJN/MEDQ  D:  10/01/2008  T:  10/02/2008  Job:  [id number]

## 2010-11-03 ENCOUNTER — Encounter: Payer: Self-pay | Admitting: Internal Medicine

## 2011-01-21 ENCOUNTER — Telehealth: Payer: Self-pay | Admitting: Cardiovascular Disease

## 2011-01-21 NOTE — Telephone Encounter (Signed)
Called and left message to reschedule 02/28/11 appt with dr Elease Hashimoto

## 2011-01-24 ENCOUNTER — Telehealth: Payer: Self-pay | Admitting: Cardiovascular Disease

## 2011-01-24 NOTE — Telephone Encounter (Signed)
Pt would like a call back regarding a statement for a past visit.  Pt can speak in detail about statement.  Please call pt back.

## 2011-02-09 NOTE — Telephone Encounter (Signed)
SPOKE WITH PATIENT.  THIS IS ON OLD BALANCE.  She had questions about current appointment.  She does not have insurance and will be self pay.  Advised her of the prompt pay discount.  Advised of the [facility] Assistance and advised we can provide her this information.  She will keep appointment and get paperwork.

## 2011-02-22 ENCOUNTER — Encounter: Payer: Self-pay | Admitting: Cardiovascular Disease

## 2011-02-23 LAB — BASIC METABOLIC PANEL
BUN: 5 — ABNORMAL LOW
CO2: 20
CO2: 21
Chloride: 105
Creatinine, Ser: 0.55
Creatinine, Ser: 0.97
GFR calc Af Amer: >60
GFR calc non Af Amer: >60
GFR calc non Af Amer: >60
GFR calc non Af Amer: >60
Glucose, Bld: 164 — ABNORMAL HIGH
Glucose, Bld: 166 — ABNORMAL HIGH
Potassium: 3.4 — ABNORMAL LOW
Potassium: 4.3
Potassium: 4.4
Sodium: 137
Sodium: 138

## 2011-02-23 LAB — LIPID PANEL
HDL: 34 — ABNORMAL LOW
Triglycerides: 70
VLDL: 14

## 2011-02-23 LAB — COMPREHENSIVE METABOLIC PANEL
ALT: 52 — ABNORMAL HIGH
Alkaline Phosphatase: 88
CO2: 25
GFR calc non Af Amer: >60
Glucose, Bld: 100 — ABNORMAL HIGH
Potassium: 3.9
Sodium: 138
Total Bilirubin: 0.7

## 2011-02-23 LAB — CBC
HCT: 36.5
HCT: 41.1
HCT: 41.4
Hemoglobin: 14
Hemoglobin: 14.2
Hemoglobin: 14.5
MCHC: 34.2
MCHC: 35
MCV: 94.8
Platelets: 148 — ABNORMAL LOW
Platelets: 151
RBC: 3.85 — ABNORMAL LOW
RBC: 4.35
RBC: 4.4
RDW: 13.7
RDW: 13.7
RDW: 13.9
WBC: 7.8
WBC: 9.5

## 2011-02-23 LAB — CARDIAC PANEL(CRET KIN+CKTOT+MB+TROPI)
CK, MB: 236.6 — ABNORMAL HIGH
CK, MB: 403.2 — ABNORMAL HIGH
Relative Index: 12 — ABNORMAL HIGH
Relative Index: 13.4 — ABNORMAL HIGH
Total CK: 1977 — ABNORMAL HIGH
Troponin I: 61.55

## 2011-02-23 LAB — PROTIME-INR
INR: 1
INR: 1.1
INR: 1.1
Prothrombin Time: 14
Prothrombin Time: 17.8 — ABNORMAL HIGH

## 2011-02-23 LAB — B-NATRIURETIC PEPTIDE (CONVERTED LAB): Pro B Natriuretic peptide (BNP): 704 — ABNORMAL HIGH

## 2011-02-28 ENCOUNTER — Ambulatory Visit: Payer: Self-pay | Admitting: Cardiovascular Disease

## 2011-03-09 ENCOUNTER — Encounter: Payer: Self-pay | Admitting: Cardiovascular Disease

## 2011-03-09 ENCOUNTER — Ambulatory Visit (INDEPENDENT_AMBULATORY_CARE_PROVIDER_SITE_OTHER): Payer: Self-pay | Admitting: Cardiovascular Disease

## 2011-03-09 ENCOUNTER — Ambulatory Visit (INDEPENDENT_AMBULATORY_CARE_PROVIDER_SITE_OTHER)
Admission: RE | Admit: 2011-03-09 | Discharge: 2011-03-09 | Disposition: A | Discharge status: Home / Self Care | Payer: Self-pay | Source: Ambulatory Visit | Attending: Cardiovascular Disease | Admitting: Cardiovascular Disease

## 2011-03-09 DIAGNOSIS — I779 Disorder of arteries and arterioles, unspecified: Secondary | ICD-10-CM

## 2011-03-09 DIAGNOSIS — R634 Abnormal weight loss: Secondary | ICD-10-CM

## 2011-03-09 DIAGNOSIS — I2510 Atherosclerotic heart disease of native coronary artery without angina pectoris: Secondary | ICD-10-CM

## 2011-03-09 DIAGNOSIS — I259 Chronic ischemic heart disease, unspecified: Secondary | ICD-10-CM

## 2011-03-09 HISTORY — DX: Disorder of arteries and arterioles, unspecified: I77.9

## 2011-03-09 MED ORDER — NITROGLYCERIN 0.4 MG SL SUBL: 0.4000 mg | SUBLINGUAL_TABLET | SUBLINGUAL | Status: DC | PRN

## 2011-03-09 MED ORDER — METOPROLOL TARTRATE 25 MG PO TABS: 12.5000 mg | ORAL_TABLET | Freq: Two times a day (BID) | ORAL | Status: DC

## 2011-03-09 MED ORDER — CLOPIDOGREL BISULFATE 75 MG PO TABS: 75.0000 mg | ORAL_TABLET | Freq: Every day | ORAL | Status: DC

## 2011-03-09 MED ORDER — ATORVASTATIN CALCIUM 80 MG PO TABS: 80.0000 mg | ORAL_TABLET | Freq: Every day | ORAL | Status: DC

## 2011-03-09 NOTE — Assessment & Plan Note (Signed)
She's lost 12-15 pounds in the past year or so. She has a history of long-term cigarette smoking. We'll check some labs today including a CBC with differential, basic metabolic profile, HFP, lipid profile, and a TSH.  We will also get PA and lateral chest x-ray to look for any signs of cancer.  She's not having any GI symptoms. Specifically she denies any constipation or diarrhea or blood in her stool. I've encouraged her to get a general medical doctor for further evaluation of his weight loss.

## 2011-03-09 NOTE — Assessment & Plan Note (Signed)
She's doing well from a cardiac standpoint. She's not had any episodes of chest discomfort. We'll continue with her same medications. We'll check a fasting lipid profile today along with her other labs.

## 2011-03-09 NOTE — Assessment & Plan Note (Signed)
She has a left carotid bruit. She has a history of coronary artery disease and long history of cigarette smoking. We will get a carotid duplex scan. I've encouraged her to stop smoking

## 2011-03-09 NOTE — Patient Instructions (Addendum)
Your physician recommends that you continue on your current medications as directed. Please refer to the Current Medication list given to you today.  Your physician wants you to follow-up in:6 months* You will receive a reminder letter in the mail two months in advance. If you don't receive a letter, please call our office to schedule the follow-up appointment.   Your physician recommends that you return for a FASTING lipid profile: 6 months   Today you will get a chest xray for weight loss along with labs/ cbc bmet hfp lipids tsh. We will call you in 3-5 days with results.   Marland Kitchen

## 2011-03-09 NOTE — Progress Notes (Signed)
Sharon [name] Date of Birth  [birth year] [facility] [address]    [address] [place], [place]  [address] [phone]  Fax  [phone]  History of Present Illness:  Sharon [name] is a 50 year old female with a history of coronary artery disease. She status post PTCA and stenting of her left anterior descending artery in May of 2009. She's done well from a cardiac standpoint. She's not had any real episodes of chest pain or shortness of breath  she has occasional episodes of some chest twinges but they're not similar to her previous episodes of angina.  She has been losing weight. She thinks she might lost 12-15 pounds over the past year or so.  She denies any heat or cold intolerance.   Her diet is pretty good. She has not had any problems with eating. She denies any blood in her urine or blood in her stool.  She denies abdominal pain.  She denies any cough or sputum production. She denies any shortness breath. She denies any constipation or diarrhea.    Current Outpatient Prescriptions on File Prior to Visit  Medication Sig Dispense Refill  . aspirin (ASPIR-LOW) 81 MG EC tablet Take 81 mg by mouth daily.        . vitamin B-12 (CYANOCOBALAMIN) 1000 MCG tablet Take 1,000 mcg by mouth daily.        . clopidogrel (PLAVIX) 75 MG tablet Take 75 mg by mouth daily.        . metoprolol tartrate (LOPRESSOR) 25 MG tablet Take 12.5 mg by mouth 2 (two) times daily.       . niacin 500 MG tablet Take 500 mg by mouth daily.        . nitroGLYCERIN (NITROSTAT) 0.4 MG SL tablet Place 0.4 mg under the tongue every 5 (five) minutes as needed.        . Omega-3 Fatty Acids (FISH OIL) 1200 MG CAPS Take by mouth daily.        . ranitidine (ZANTAC) 150 MG capsule Take 150 mg by mouth 2 (two) times daily.        . rosuvastatin (CRESTOR) 40 MG tablet Take 40 mg by mouth at bedtime.          Allergies  Allergen Reactions  . Codeine     Past Medical History  Diagnosis Date  . Ischemic cardiomyopathy   .  Hyperlipidemia   . Tobacco user   . Coronary artery disease   . Occlusion of right coronary artery   . Aortic insufficiency   . Acute ST segment elevation MI     Past Surgical History  Procedure Date  . Cholecystectomy   . Tubal ligation   . Ectopic pregnancies requiring laparotomies   . Cardiac catheterization 10/15/2007    EF 30-40%  . Coronary angioplasty with stent placement     LAD  . US echocardiography 01/07/2008    EF 55-60%  . Transthoracic echocardiogram 10/17/2007    History  Smoking status  . Current Everyday Smoker -- 1.0 packs/day for 36 years  . Types: Cigarettes  Smokeless tobacco  . Not on file    History  Alcohol Use  . Yes    occasional    Family History  Problem Relation Age of Onset  . Diabetes Mother   . Hypertension Mother   . Cancer Father   . Obesity Sister   . Cancer Sister     Reviw of Systems:  Reviewed in the HPI.  All other  systems are negative.  Physical Exam: BP 131/78  Pulse 66  Ht 5\' 7"  (1.702 m)  Wt 127 lb 1.9 oz (57.661 kg)  BMI 19.91 kg/m2 The patient is alert and oriented x 3.  The mood and affect are normal.   Skin: warm and dry.  Color is normal.    HEENT:   the sclera are nonicteric.  The mucous membranes are moist.  The carotids are 2+ with a left carotid bruit.  There is no thyromegaly.  There is no JVD.    Lungs: clear.  The chest wall is non tender.    Heart: regular rate with a normal S1 and S2.  There is a 2/6 systolic murmur radiating to the axilla. The PMI is not displaced.     Abdomen: good bowel sounds.  There is no guarding or rebound.  There is no hepatosplenomegaly or tenderness.  There are no masses.   Extremities:  no clubbing, cyanosis, or edema.  The legs are without rashes.  The distal pulses are intact.   Neuro:  Cranial nerves II - XII are intact.  Motor and sensory functions are intact.    The gait is normal.  ECG: Normal sinus rhythm. She has an old septal myocardial infarction. There  are no acute changes.  Assessment / Plan:

## 2011-03-10 LAB — HEPATIC FUNCTION PANEL
ALT: 10 U/L (ref 0–35)
AST: 20 U/L (ref 0–37)
Total Bilirubin: 0.3 mg/dL (ref 0.3–1.2)

## 2011-03-10 LAB — BASIC METABOLIC PANEL
BUN: 10 mg/dL (ref 6–23)
CO2: 24 mEq/L (ref 19–32)
Calcium: 9.1 mg/dL (ref 8.4–10.5)
Creatinine, Ser: 0.6 mg/dL (ref 0.4–1.2)
Glucose, Bld: 82 mg/dL (ref 70–99)

## 2011-03-10 LAB — CBC WITH DIFFERENTIAL/PLATELET
Basophils Absolute: 0.2 10*3/uL — ABNORMAL HIGH (ref 0.0–0.1)
Eosinophils Absolute: 0.1 10*3/uL (ref 0.0–0.7)
Hemoglobin: 15.2 g/dL — ABNORMAL HIGH (ref 12.0–15.0)
Lymphocytes Relative: 31.6 % (ref 12.0–46.0)
MCHC: 34.1 g/dL (ref 30.0–36.0)
Neutro Abs: 5.1 10*3/uL (ref 1.4–7.7)
Neutrophils Relative %: 59.5 % (ref 43.0–77.0)
RDW: 14.3 % (ref 11.5–14.6)

## 2011-03-10 LAB — LIPID PANEL: Triglycerides: 61 mg/dL (ref 0.0–149.0)

## 2011-03-16 ENCOUNTER — Telehealth: Payer: Self-pay | Admitting: *Deleted

## 2011-03-16 NOTE — Telephone Encounter (Signed)
Advised of lab results,patient started back on Lipitor should she change to crestor or continue on lipitor?

## 2011-03-16 NOTE — Telephone Encounter (Signed)
Message copied by Eugenia Pancoast on Wed Mar 16, 2011 10:41 AM ------      Message from: [place], Deloris Ping      Created: Mon Mar 14, 2011  5:31 AM       Her cholesterol levels remain very elevated.  If she is really taking her lipator 80 mg a day, she needs to change to Crestor 40 a day and recheck fasting labs in 3 months.  If she was actually not taking her lipator, she should resume and recheck labs in 3 mon.

## 2011-03-16 NOTE — Telephone Encounter (Signed)
Message copied by Eugenia Pancoast on Wed Mar 16, 2011 10:40 AM ------      Message from: Vesta Mixer      Created: Mon Mar 14, 2011  5:36 AM       C/w copd.  Pt needs to stop smoking if she is still smoking.

## 2011-03-16 NOTE — Telephone Encounter (Signed)
Advised of CXR results 

## 2011-03-17 NOTE — Telephone Encounter (Signed)
Re-start Lipitor 

## 2011-06-06 ENCOUNTER — Other Ambulatory Visit: Payer: Self-pay | Admitting: Cardiovascular Disease

## 2011-06-06 ENCOUNTER — Other Ambulatory Visit (INDEPENDENT_AMBULATORY_CARE_PROVIDER_SITE_OTHER): Payer: Self-pay | Admitting: *Deleted

## 2011-06-06 DIAGNOSIS — I2510 Atherosclerotic heart disease of native coronary artery without angina pectoris: Secondary | ICD-10-CM

## 2011-06-06 LAB — BASIC METABOLIC PANEL
BUN: 10 mg/dL (ref 6–23)
CO2: 24 mEq/L (ref 19–32)
GFR: 100.36 mL/min (ref >60.00)
Glucose, Bld: 97 mg/dL (ref 70–99)
Potassium: 3.9 mEq/L (ref 3.5–5.1)

## 2011-06-06 LAB — LIPID PANEL
Total CHOL/HDL Ratio: 4
VLDL: 9.8 mg/dL (ref 0.0–40.0)

## 2011-06-06 LAB — LDL CHOLESTEROL, DIRECT: Direct LDL: 140.7 mg/dL

## 2011-06-07 ENCOUNTER — Telehealth: Payer: Self-pay | Admitting: *Deleted

## 2011-06-07 ENCOUNTER — Telehealth: Payer: Self-pay | Admitting: Cardiovascular Disease

## 2011-06-07 DIAGNOSIS — E785 Hyperlipidemia, unspecified: Secondary | ICD-10-CM

## 2011-06-07 LAB — HEPATIC FUNCTION PANEL
ALT: 16 U/L (ref 0–35)
Bilirubin, Direct: 0.1 mg/dL (ref 0.0–0.3)
Total Bilirubin: 0.9 mg/dL (ref 0.3–1.2)

## 2011-06-07 NOTE — Telephone Encounter (Signed)
Need to see if she will add zetia to her med regime. Dr Elease Hashimoto note on LDL result. msg left to call office back.

## 2011-06-07 NOTE — Telephone Encounter (Signed)
Message copied by Antony Odea on Tue Jun 07, 2011  5:20 PM ------      Message from: [place], Deloris Ping      Created: Mon Jun 06, 2011  5:40 PM       She is on max dose Lipitor.  Will she consider taking Zetia 10 mg a day.

## 2011-06-07 NOTE — Telephone Encounter (Signed)
Fu  Call Patient returning your call 

## 2011-06-07 NOTE — Telephone Encounter (Signed)
After last cholesterol check you wanted her to add zetia to her lipitor to lower her LDL, when I called she complained of leg muscle ache since starting lipitor. She requested a change. She tolerated crestor 40 mg prior per pt and was switched then due to cost, she is willing to try again if you feel this would be best selection for her. Please advise.

## 2011-06-07 NOTE — Telephone Encounter (Signed)
Message copied by Antony Odea on Tue Jun 07, 2011  5:28 PM ------      Message from: Vesta Mixer      Created: Mon Jun 06, 2011  5:40 PM       She is on max dose Lipitor.  Will she consider taking Zetia 10 mg a day.

## 2011-06-08 MED ORDER — ROSUVASTATIN CALCIUM 40 MG PO TABS: 40.0000 mg | ORAL_TABLET | Freq: Every day | ORAL | Status: DC

## 2011-06-08 NOTE — Telephone Encounter (Signed)
If she is willing to go back on Crestor, I would prefer that.  I have found that leg crestor causes more muscle aches than lipitor.  I would prefer that she go back to crestor

## 2011-06-08 NOTE — Telephone Encounter (Signed)
msg left to make switch from lipitor back to crestor and i set fasting lab app date left in msg and to call with any questions or concerns

## 2011-08-10 ENCOUNTER — Other Ambulatory Visit: Payer: Self-pay

## 2012-10-08 ENCOUNTER — Encounter: Payer: Self-pay | Admitting: Cardiovascular Disease

## 2012-11-22 ENCOUNTER — Encounter: Payer: Self-pay | Admitting: Cardiovascular Disease

## 2012-11-28 ENCOUNTER — Encounter: Payer: Self-pay | Admitting: Cardiovascular Disease

## 2012-11-28 ENCOUNTER — Ambulatory Visit (INDEPENDENT_AMBULATORY_CARE_PROVIDER_SITE_OTHER): Payer: BC Managed Care – PPO | Admitting: Cardiovascular Disease

## 2012-11-28 VITALS — BP 128/80 | HR 81 | Ht 67.0 in | Wt 137.8 lb

## 2012-11-28 DIAGNOSIS — I779 Disorder of arteries and arterioles, unspecified: Secondary | ICD-10-CM

## 2012-11-28 MED ORDER — ATORVASTATIN CALCIUM 40 MG PO TABS: 40.0000 mg | ORAL_TABLET | Freq: Every day | ORAL | Status: DC

## 2012-11-28 MED ORDER — CLOPIDOGREL BISULFATE 75 MG PO TABS: 75.0000 mg | ORAL_TABLET | Freq: Every day | ORAL | Status: DC

## 2012-11-28 NOTE — Patient Instructions (Addendum)
Your physician wants you to follow-up in: 1 YEAR You will receive a reminder letter in the mail two months in advance. If you don't receive a letter, please call our office to schedule the follow-up appointment.  Your physician has recommended you make the following change in your medication:  STOP CRESTOR DUE TO COST START ATORVASTATIN 40 MG DAILY, TRY AGAIN ALTHOUGH CAUSED LEG CRAMPS IN PAST,  TAKE OVER THE COUNTER COQ10 DAILY TO HELP REDUCE LEG CRAMPS  Your physician recommends that you return for a FASTING lipid profile:  YOU NEED LABS IN 3 MONTHS I HAVE GIVEN YOU A SCRIPT TO TAKE TO A LAB IN September PLEASE.

## 2012-11-28 NOTE — Assessment & Plan Note (Signed)
Sharon [name] presents today with generalized weakness and leg pain. I saw her approximately one and a half years ago. She stopped almost all of her medications-she still takes an aspirin a day. She's not been having any episodes of angina.  Her cholesterol levels drawn at [facility] office revealed a cholesterol is markedly elevated. She had stopped her Crestor because of cost. She has taken Lipitor in the past but it caused leg pains.  We'll try atorvastatin again. I've asked her to take some coenzyme Q10 to see if that helps with the leg discomfort.  Her distal leg pulses are 2+. She does not have peripheral arterial disease.    I've asked her to check back with Rene Kocher to see if she needs any further evaluation of her back or legs. I'll see her again in one year for followup visit. I recommended that she stop smoking which should help reduce the risk of further coronary disease.

## 2012-11-28 NOTE — Progress Notes (Signed)
Sharon [name] Date of Birth  03-Apr-1961 [facility] [address]    [address] [place], [place]  [address] [phone]  Fax  [phone]   Problem List: 1. CAD - stent 2009,  Proximal LAD - 3.0 x 18-mm Promus stent  2. Hyperlipidemia 3.    History of Present Illness:  Sharon [name] is a 52 year old female with a history of coronary artery disease. She status post PTCA and stenting of her left anterior descending artery in May of 2009. She's done well from a cardiac standpoint. She's not had any real episodes of chest pain or shortness of breath  she has occasional episodes of some chest twinges but they're not similar to her previous episodes of angina.  She has been losing weight. She thinks she might lost 12-15 pounds over the past year or so.  She denies any heat or cold intolerance.   Her diet is pretty good. She has not had any problems with eating. She denies any blood in her urine or blood in her stool.  She denies abdominal pain.  She denies any cough or sputum production. She denies any shortness breath. She denies any constipation or diarrhea.    July 2014:   Sharon [name] presents with episodes of dizziness and fatigue.  She has lots of leg aching - particularly in the left leg.    Current Outpatient Prescriptions on File Prior to Visit  Medication Sig Dispense Refill  . aspirin (ASPIR-LOW) 81 MG EC tablet Take 81 mg by mouth daily.        . nitroGLYCERIN (NITROSTAT) 0.4 MG SL tablet Place 1 tablet (0.4 mg total) under the tongue every 5 (five) minutes as needed.  25 tablet  5   No current facility-administered medications on file prior to visit.    Allergies  Allergen Reactions  . Codeine   . Lipitor (Atorvastatin Calcium) Other (See Comments)    Muscle ache    Past Medical History  Diagnosis Date  . Ischemic cardiomyopathy   . Hyperlipidemia   . Tobacco user   . Coronary artery disease   . Occlusion of right coronary artery   . Aortic insufficiency   . Acute ST  segment elevation MI     Past Surgical History  Procedure Laterality Date  . Cholecystectomy    . Tubal ligation    . Ectopic pregnancies requiring laparotomies    . Cardiac catheterization  10/15/2007    EF 30-40%  . Coronary angioplasty with stent placement      LAD  . US echocardiography  01/07/2008    EF 55-60%  . Transthoracic echocardiogram  10/17/2007    History  Smoking status  . Current Every Day Smoker -- 1.00 packs/day for 36 years  . Types: Cigarettes  Smokeless tobacco  . Not on file    History  Alcohol Use  . Yes    Comment: occasional    Family History  Problem Relation Age of Onset  . Diabetes Mother   . Hypertension Mother   . Cancer Father   . Obesity Sister   . Cancer Sister     Reviw of Systems:  Reviewed in the HPI.  All other systems are negative.  Physical Exam: BP 128/80  Pulse 81  Ht 5\' 7"  (1.702 m)  Wt 137 lb 12.8 oz (62.506 kg)  BMI 21.58 kg/m2  SpO2 96% The patient is alert and oriented x 3.  The mood and affect are normal.   Skin: warm and dry.  Color is normal.    HEENT:   the sclera are nonicteric.  The mucous membranes are moist.  The carotids are 2+ with a left carotid bruit.  There is no thyromegaly.  There is no JVD.    Lungs: clear.  The chest wall is non tender.    Heart: regular rate with a normal S1 and S2.  There is a 2/6 systolic murmur radiating to the axilla. The PMI is not displaced.     Abdomen: good bowel sounds.  There is no guarding or rebound.  There is no hepatosplenomegaly or tenderness.  There are no masses.   Extremities:  no clubbing, cyanosis, or edema.  The legs are without rashes.  The distal pulses are intact ad are 2 +    Neuro:  Cranial nerves II - XII are intact.  Motor and sensory functions are intact.    The gait is normal.  ECG: Oct 09, 2012 [facility], NP)  NSR no st or T wave changes.   Assessment / Plan:

## 2013-12-13 ENCOUNTER — Telehealth: Payer: Self-pay | Admitting: Cardiovascular Disease

## 2013-12-13 NOTE — Telephone Encounter (Signed)
New message  Pt called states that she is having legs pains mostly her left leg.. Believe that its heart related.. Please call back to discuss.

## 2013-12-13 NOTE — Telephone Encounter (Signed)
Patient calling today to report onset of leg pain again (ongoing problem in 2014). She states that it started this month and it is a "stinging and knotting like" pain but not a cramp. It is bilaterally, as of last night, but previously was in left leg.  It is mostly centered in her thigh and extending up to her groin area. She is also having soreness/pain to the lower left leg too.  She states she has not had any recent injuries or taken new medications, but she did stop her medications on 12/07/13 to see if it would alleviate the leg pain. The medications she stopped were: Plavix, ASA and Lipitor.  She said she is restarting them today though since her pain has not improved at all. She says that last year, she did try the Coenzyme Q10 but it only helped briefly and then not at all. She is wondering if she needs to come see Dr. Acie Fredrickson or what should she do at this time. Message routed to Dr. Acie Fredrickson.  Patient knows to call back for any worsening or new symptoms or concerns.

## 2013-12-14 NOTE — Telephone Encounter (Signed)
The lipitor may be causing some muscle aches.  She should stop that for a month or so to see if that helps. She has been a long time smoker and may have PAD.  Can she tell if the circulation is ok in that leg.?   Does it hurt worse with walking?  She should see her medical doctor . Unless it is related to her lipitor or is due to leg ischemia, its probably not a cardiac or vascular problem

## 2013-12-16 NOTE — Telephone Encounter (Signed)
Called and spoke with patient who states that she has bilateral leg swelling due to standing at work for 12 hours at the time.  Patient states pain is worse with walking.  I advised patient that Dr. Acie Fredrickson advises patient may remain off Lipitor x 1 month to see if this helps.  Patient states she has resumed taking the Lipitor, ASA, and Plavix.  I advised patient to stop the Lipitor until f/u appointment on 8/24.  I advised patient not to stop Plavix and ASA again without discussing with Dr. Acie Fredrickson. Patient states she does not know if her circulation in her lower extremities is poor - states she will keep an eye on this.  States bilateral lower extremity swelling improves when she elevates her legs.  I advised patient to call back prior to appointment if she has questions or concerns.  Patient verbalized understanding and agreement.

## 2014-01-04 ENCOUNTER — Emergency Department (HOSPITAL_COMMUNITY)
Admission: EM | Admit: 2014-01-04 | Discharge: 2014-01-05 | Disposition: A | Discharge status: Home / Self Care | Payer: BC Managed Care – PPO | Attending: Emergency Medicine | Admitting: Emergency Medicine

## 2014-01-04 ENCOUNTER — Encounter (HOSPITAL_COMMUNITY): Payer: Self-pay | Admitting: Emergency Medicine

## 2014-01-04 DIAGNOSIS — Z862 Personal history of diseases of the blood and blood-forming organs and certain disorders involving the immune mechanism: Secondary | ICD-10-CM | POA: Insufficient documentation

## 2014-01-04 DIAGNOSIS — Z7902 Long term (current) use of antithrombotics/antiplatelets: Secondary | ICD-10-CM | POA: Insufficient documentation

## 2014-01-04 DIAGNOSIS — I252 Old myocardial infarction: Secondary | ICD-10-CM | POA: Insufficient documentation

## 2014-01-04 DIAGNOSIS — I2510 Atherosclerotic heart disease of native coronary artery without angina pectoris: Secondary | ICD-10-CM | POA: Insufficient documentation

## 2014-01-04 DIAGNOSIS — F17200 Nicotine dependence, unspecified, uncomplicated: Secondary | ICD-10-CM | POA: Insufficient documentation

## 2014-01-04 DIAGNOSIS — S0083XA Contusion of other part of head, initial encounter: Secondary | ICD-10-CM | POA: Insufficient documentation

## 2014-01-04 DIAGNOSIS — Z8639 Personal history of other endocrine, nutritional and metabolic disease: Secondary | ICD-10-CM | POA: Insufficient documentation

## 2014-01-04 DIAGNOSIS — S199XXA Unspecified injury of neck, initial encounter: Secondary | ICD-10-CM

## 2014-01-04 DIAGNOSIS — S2249XA Multiple fractures of ribs, unspecified side, initial encounter for closed fracture: Secondary | ICD-10-CM | POA: Insufficient documentation

## 2014-01-04 DIAGNOSIS — S0003XA Contusion of scalp, initial encounter: Secondary | ICD-10-CM | POA: Insufficient documentation

## 2014-01-04 DIAGNOSIS — Z9861 Coronary angioplasty status: Secondary | ICD-10-CM | POA: Insufficient documentation

## 2014-01-04 DIAGNOSIS — S0993XA Unspecified injury of face, initial encounter: Secondary | ICD-10-CM | POA: Insufficient documentation

## 2014-01-04 DIAGNOSIS — S298XXA Other specified injuries of thorax, initial encounter: Secondary | ICD-10-CM | POA: Insufficient documentation

## 2014-01-04 DIAGNOSIS — S1093XA Contusion of unspecified part of neck, initial encounter: Secondary | ICD-10-CM

## 2014-01-04 DIAGNOSIS — S2232XA Fracture of one rib, left side, initial encounter for closed fracture: Secondary | ICD-10-CM

## 2014-01-04 DIAGNOSIS — Z7982 Long term (current) use of aspirin: Secondary | ICD-10-CM | POA: Insufficient documentation

## 2014-01-04 MED ORDER — OXYCODONE-ACETAMINOPHEN 5-325 MG PO TABS
1.0000 | ORAL_TABLET | Freq: Once | ORAL | Status: AC
  Administered 2014-01-04: 1 via ORAL
  Filled 2014-01-04: qty 1
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
## 2014-01-05 ENCOUNTER — Emergency Department (HOSPITAL_COMMUNITY): Payer: BC Managed Care – PPO

## 2014-01-05 MED ORDER — OXYCODONE-ACETAMINOPHEN 5-325 MG PO TABS: 1.0000 | ORAL_TABLET | ORAL | Status: DC | PRN

## 2014-01-05 MED ORDER — IBUPROFEN 800 MG PO TABS: 800.0000 mg | ORAL_TABLET | Freq: Three times a day (TID) | ORAL | Status: DC | PRN

## 2014-01-05 NOTE — Discharge Instructions (Signed)
You have 2 fractured ribs on the left.  Return here as needed use ice and heat on her ribs.  Followup with your primary care Dr.

## 2014-01-05 NOTE — ED Provider Notes (Signed)
Ct scan negative for fracture discharged home per C. Lawyers previous plan   Garald Balding, NP 01/05/14 [phone]
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
## 2014-01-05 NOTE — ED Provider Notes (Signed)
Medical screening examination/treatment/procedure(s) were performed by non-physician practitioner and as supervising physician I was immediately available for consultation/collaboration.  Richarda Blade, MD 01/05/14 [phone]

## 2014-01-06 NOTE — ED Provider Notes (Signed)
Medical screening examination/treatment/procedure(s) were performed by non-physician practitioner and as supervising physician I was immediately available for consultation/collaboration.   EKG Interpretation None        Julianne Rice, MD 01/06/14 [phone]

## 2014-01-20 ENCOUNTER — Encounter: Payer: Self-pay | Admitting: Cardiovascular Disease

## 2014-01-20 ENCOUNTER — Ambulatory Visit (INDEPENDENT_AMBULATORY_CARE_PROVIDER_SITE_OTHER): Payer: BC Managed Care – PPO | Admitting: Cardiovascular Disease

## 2014-01-20 VITALS — BP 100/74 | HR 75 | Ht 67.0 in | Wt 138.8 lb

## 2014-01-20 DIAGNOSIS — I779 Disorder of arteries and arterioles, unspecified: Secondary | ICD-10-CM

## 2014-01-20 DIAGNOSIS — I2510 Atherosclerotic heart disease of native coronary artery without angina pectoris: Secondary | ICD-10-CM

## 2014-01-20 DIAGNOSIS — I259 Chronic ischemic heart disease, unspecified: Secondary | ICD-10-CM

## 2014-01-20 DIAGNOSIS — E785 Hyperlipidemia, unspecified: Secondary | ICD-10-CM

## 2014-01-20 DIAGNOSIS — I739 Peripheral vascular disease, unspecified: Secondary | ICD-10-CM

## 2014-01-20 LAB — BASIC METABOLIC PANEL
BUN: 7 mg/dL (ref 6–23)
CO2: 24 mEq/L (ref 19–32)
CREATININE: 0.8 mg/dL (ref 0.4–1.2)
Calcium: 9.6 mg/dL (ref 8.4–10.5)
Chloride: 103 mEq/L (ref 96–112)
GFR: 78.43 mL/min (ref >60.00)
Glucose, Bld: 92 mg/dL (ref 70–99)
Potassium: 4.5 mEq/L (ref 3.5–5.1)
Sodium: 138 mEq/L (ref 135–145)

## 2014-01-20 LAB — HEPATIC FUNCTION PANEL
ALT: 14 U/L (ref 0–35)
AST: 20 U/L (ref 0–37)
Albumin: 3.9 g/dL (ref 3.5–5.2)
Alkaline Phosphatase: 98 U/L (ref 39–117)
BILIRUBIN DIRECT: 0 mg/dL (ref 0.0–0.3)
Total Bilirubin: 0.3 mg/dL (ref 0.2–1.2)
Total Protein: 7.7 g/dL (ref 6.0–8.3)

## 2014-01-20 LAB — LIPID PANEL
CHOLESTEROL: 275 mg/dL — AB (ref 0–200)
HDL: 50.5 mg/dL (ref >39.00)
LDL Cholesterol: 204 mg/dL — ABNORMAL HIGH (ref 0–99)
NonHDL: 224.5
Total CHOL/HDL Ratio: 5
Triglycerides: 102 mg/dL (ref 0.0–149.0)
VLDL: 20.4 mg/dL (ref 0.0–40.0)

## 2014-01-20 MED ORDER — EZETIMIBE 10 MG PO TABS: 10.0000 mg | ORAL_TABLET | Freq: Every day | ORAL | Status: DC
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
## 2014-01-20 NOTE — Assessment & Plan Note (Signed)
Sharon [name] has hx of  coronary artery disease and PCI. She did not tolerate the atorvastatin. We'll check her lipids again today. We'll start her on Zetia 10 mg a day and get repeat labs in 3 months. She may stop by her general medical doctor's office and get a repeat labs at that time.

## 2014-01-20 NOTE — Assessment & Plan Note (Signed)
Chest bilateral carotid bruits. She's had a carotid duplex in the remote past but none recently. We'll repeat the study.

## 2014-01-20 NOTE — Assessment & Plan Note (Signed)
I encouraged her to stop smoking

## 2014-01-20 NOTE — Patient Instructions (Addendum)
Your physician has requested that you have a carotid duplex. This test is an ultrasound of the carotid arteries in your neck. It looks at blood flow through these arteries that supply the brain with blood. Allow one hour for this exam. There are no restrictions or special instructions.  Your physician recommends that you have for lab work today:  Cholesterol, liver, basic metabolic panel  Your physician recommends that you return for lab work in: 3 months for repeat cholesterol, liver, basic metabolic panel You will need to FAST for this appointment - nothing to eat or drink after midnight the night before except water. - Patient states she will f/u with PCP for these labs  Your physician wants you to follow-up in: 1 year with Dr. Acie Fredrickson.  You will receive a reminder letter in the mail two months in advance. If you don't receive a letter, please call our office to schedule the follow-up appointment.  Your physician has recommended you make the following change in your medication:  START Zetia 10 mg once daily DO NOT RESUME LIPITOR

## 2014-01-21 ENCOUNTER — Telehealth: Payer: Self-pay | Admitting: Nurse Practitioner

## 2014-01-21 NOTE — Telephone Encounter (Signed)
Message copied by Emmaline Life on Tue Jan 21, 2014 12:47 PM ------      Message from: Thayer Headings      Created: Mon Jan 20, 2014  5:39 PM       She did not tolerated the atorvastatin. We had started her on Zetia 10 mg a day ( already ordered from the office visit) ------
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
## 2014-01-21 NOTE — Telephone Encounter (Signed)
i do not have any other suggestions - she does not tolerate statins. Refer to lipid clinic Restart plavix now

## 2014-01-21 NOTE — Telephone Encounter (Signed)
Spoke with patient and advised her to restart Plavix; patient verbalized understanding and agreement.  Patient scheduled for lipid clinic appointment on 8/27.

## 2014-01-22 ENCOUNTER — Ambulatory Visit (HOSPITAL_COMMUNITY): Payer: BC Managed Care – PPO | Attending: Cardiovascular Disease | Admitting: *Deleted

## 2014-01-22 DIAGNOSIS — F17200 Nicotine dependence, unspecified, uncomplicated: Secondary | ICD-10-CM | POA: Insufficient documentation

## 2014-01-22 DIAGNOSIS — E785 Hyperlipidemia, unspecified: Secondary | ICD-10-CM | POA: Insufficient documentation

## 2014-01-22 DIAGNOSIS — R0989 Other specified symptoms and signs involving the circulatory and respiratory systems: Secondary | ICD-10-CM

## 2014-01-22 DIAGNOSIS — I2510 Atherosclerotic heart disease of native coronary artery without angina pectoris: Secondary | ICD-10-CM | POA: Diagnosis not present

## 2014-01-22 DIAGNOSIS — I6529 Occlusion and stenosis of unspecified carotid artery: Secondary | ICD-10-CM | POA: Insufficient documentation

## 2014-01-22 DIAGNOSIS — I739 Peripheral vascular disease, unspecified: Secondary | ICD-10-CM

## 2014-01-22 DIAGNOSIS — I779 Disorder of arteries and arterioles, unspecified: Secondary | ICD-10-CM

## 2014-01-22 NOTE — Progress Notes (Signed)
Carotid duplex complete 

## 2014-01-23 ENCOUNTER — Ambulatory Visit (INDEPENDENT_AMBULATORY_CARE_PROVIDER_SITE_OTHER): Payer: BC Managed Care – PPO | Admitting: Pharmacist

## 2014-01-23 VITALS — Wt 139.0 lb

## 2014-01-23 DIAGNOSIS — Z79899 Other long term (current) drug therapy: Secondary | ICD-10-CM

## 2014-01-23 DIAGNOSIS — E785 Hyperlipidemia, unspecified: Secondary | ICD-10-CM

## 2014-01-23 MED ORDER — SIMVASTATIN 20 MG PO TABS: 20.0000 mg | ORAL_TABLET | Freq: Every day | ORAL | Status: DC

## 2014-01-23 MED ORDER — CLOPIDOGREL BISULFATE 75 MG PO TABS: 75.0000 mg | ORAL_TABLET | Freq: Every day | ORAL | Status: DC

## 2014-01-23 NOTE — Assessment & Plan Note (Signed)
Patient and I discussed her lipid lowering options.  Given her h/o CAD and LDL > 200 mg/dL, patient needs to be on a daily moderate-high potency statin if possible.  Her insurance is limiting her options as Crestor and Zetia aren't affordable for her.  Will have patient restart simvastatin at dose of 20 mg qd today and recheck panel in 3 months.  Could increase dose at that time if needed, or possibly consider PCSK-9 inhibitor.  Need to get on daily statin first.  If she has trouble tolerating simvastatin 20 mg qd, she is to cut it down to 10 mg qd.  If she still has trouble with this, she agrees to call me, and will likely start pravastatin at that time.

## 2014-01-23 NOTE — Progress Notes (Signed)
Patient is a pleasant 53 y.o. WF referred to lipid clinic by Dr. Acie Fredrickson given h/o CAD (PCI 2009) and trouble tolerating, or affording lipid lowering medications.  Patient tolerated Crestor 40 mg in the past, but had to stop it due to cost.  Zetia was recently prescribed, but was going to cost $600 for a 3 month supply which patient couldn't afford.  She tried atorvastatin in the past, but had to d/c due to severe muscle aches.  She tells me she tried simvastatin in the past, but thinks it was stopped due to lack of potency, and that is when Crestor was started (then stopped due to cost).  Never tried simvastatin again since.  Patient states she never tried pravastatin either.    RF:  CAD, tobacco use (1 ppd), age - LDL goal < 70, non-HDL goal < 100 Meds:  Not on lipid lowering therapy currently. Intolerant:  Lipitor (severe muscle aches) Couldn't afford:  Crestor, Zetia.  Patient works at the [employer], and states she walks b/t 4-5 miles daily.  Her diet consists of coffee only for breakfast, crackers for lunch (at work), and a meat, starch, and vegetable for dinner.  She hopes to quit smoking next month as she was recently put on an antidepressant to help with her depression and anxiety.  Family history is strong for cancer, but not significant for premature CAD.  Labs: 12/2013:  TC 275, LDL 204, TG 102, HDL 51, non-HDL 225 (not on lipid lowering meds)  Current Outpatient Prescriptions  Medication Sig Dispense Refill  . aspirin (ASPIR-LOW) 81 MG EC tablet Take 81 mg by mouth daily. On hold      . Aspirin-Acetaminophen-Caffeine (GOODYS EXTRA STRENGTH) 500-325-65 MG PACK Take 1 packet by mouth every 6 (six) hours as needed (for pain.).      Marland Kitchen clopidogrel (PLAVIX) 75 MG tablet Take 1 tablet (75 mg total) by mouth daily.  90 tablet  3  . ibuprofen (ADVIL,MOTRIN) 800 MG tablet Take 1 tablet (800 mg total) by mouth every 8 (eight) hours as needed.  21 tablet  0  . oxyCODONE-acetaminophen  (PERCOCET/ROXICET) 5-325 MG per tablet Take 1 tablet by mouth every 4 (four) hours as needed for severe pain (10-325MG ).       No current facility-administered medications for this visit.   Allergies  Allergen Reactions  . Codeine Shortness Of Breath, Nausea Only and Swelling    Tongue swells  . Lipitor [Atorvastatin Calcium] Other (See Comments)    Muscle ache   Family History  Problem Relation Age of Onset  . Diabetes Mother   . Hypertension Mother   . Cancer Father   . Obesity Sister   . Cancer Sister

## 2014-01-23 NOTE — Patient Instructions (Signed)
1.  Start simvastatin 20 mg once daily.  Take it in the evening.   If muscle aches occur, cut it in 1/2, and take 1/2 tablet daily. 2.  Recheck cholesterol / liver in 3 months on 04/28/14 (fasting lab  -lab opens at 7:30 am), and see Ysidro Evert next day on 04/29/14 at 11:00 am to review  Phone:  [place]

## 2014-02-19 ENCOUNTER — Telehealth: Payer: Self-pay | Admitting: Pharmacist

## 2014-02-19 MED ORDER — SIMVASTATIN 20 MG PO TABS: 10.0000 mg | ORAL_TABLET | Freq: Every day | ORAL | Status: DC

## 2014-02-19 NOTE — Telephone Encounter (Signed)
Patient called to inform us that she had to cut her simvastatin 20 mg dose in half due to side effects, and that she will continue simvastatin 10 mg qhs until her appointment on 04/29/14.

## 2014-04-28 ENCOUNTER — Other Ambulatory Visit (INDEPENDENT_AMBULATORY_CARE_PROVIDER_SITE_OTHER): Payer: BC Managed Care – PPO | Admitting: *Deleted

## 2014-04-28 DIAGNOSIS — E785 Hyperlipidemia, unspecified: Secondary | ICD-10-CM

## 2014-04-28 DIAGNOSIS — Z79899 Other long term (current) drug therapy: Secondary | ICD-10-CM

## 2014-04-28 LAB — LIPID PANEL
CHOL/HDL RATIO: 6
Cholesterol: 243 mg/dL — ABNORMAL HIGH (ref 0–200)
HDL: 41.2 mg/dL (ref >39.00)
LDL CALC: 190 mg/dL — AB (ref 0–99)
NonHDL: 201.8
TRIGLYCERIDES: 61 mg/dL (ref 0.0–149.0)
VLDL: 12.2 mg/dL (ref 0.0–40.0)

## 2014-04-28 LAB — HEPATIC FUNCTION PANEL
ALT: 15 U/L (ref 0–35)
AST: 23 U/L (ref 0–37)
Albumin: 3.6 g/dL (ref 3.5–5.2)
Alkaline Phosphatase: 88 U/L (ref 39–117)
BILIRUBIN DIRECT: 0 mg/dL (ref 0.0–0.3)
TOTAL PROTEIN: 6.7 g/dL (ref 6.0–8.3)
Total Bilirubin: 0.8 mg/dL (ref 0.2–1.2)

## 2014-04-29 ENCOUNTER — Ambulatory Visit (INDEPENDENT_AMBULATORY_CARE_PROVIDER_SITE_OTHER): Payer: BC Managed Care – PPO | Admitting: Pharmacist Clinician (PhC)/ Clinical Pharmacy Specialist

## 2014-04-29 DIAGNOSIS — E785 Hyperlipidemia, unspecified: Secondary | ICD-10-CM

## 2014-04-29 NOTE — Patient Instructions (Signed)
We will call in new prescriptions for Crestor 10 mg and Zetia 10 mg after January 1.  Please call me Erasmo Downer at [phone] once you get the new insurance cards and have taken them to [facility].  [facility] your current prescription of simvastatin but do not refill it.    We will repeat blood work in March, after you have been on the new medications for at least 2 months.

## 2014-04-30 ENCOUNTER — Encounter: Payer: Self-pay | Admitting: Pharmacist Clinician (PhC)/ Clinical Pharmacy Specialist

## 2014-04-30 NOTE — Assessment & Plan Note (Signed)
The simvastatin has had very little impact on her cholesterol labs.  Because she has a high deductible plan for the remainder of 2015, I have asked her to continue with her simvastatin for the remainder of the month.  Once she gets her new [employer], we will see if the Crestor and Zetia are more affordable for her.  I plan to start her on Crestor 10mg  three times per week.  If she tolerates this for 3-4 weeks we will add Zetia 10mg  daily.  Once we get her taking the medications for 2-3 months we can repeat lipid panel and determine further course of action.

## 2014-04-30 NOTE — Progress Notes (Signed)
Patient is a pleasant 53 y.o. WF referred to lipid clinic by Dr. Acie Fredrickson given h/o CAD (PCI 2009) and trouble tolerating, or affording lipid lowering medications.  Patient tolerated Crestor 40 mg in the past, but had to stop it due to cost.  Zetia was recently prescribed, but was going to cost $600 for a 3 month supply which patient couldn't afford.  She tried atorvastatin in the past, but had to d/c due to severe muscle aches.  She was restarted on simvastatin 20mg , but decreased to 10mg  daily after several weeks due to muscle aches.  She has been tolerating this dose with some leg pain.      RF:  CAD, tobacco use (1 ppd), age - LDL goal < 70, non-HDL goal < 100 Meds:  Not on lipid lowering therapy currently. Intolerant:  Lipitor (severe muscle aches) Couldn't afford:  Crestor, Zetia. - she has self purchased insurance and will be switching to new plan in 2016.  Her deductible from [employer] was increased this past year to $11,000, so even with the manufacturer copay cards, her cost of Crestor and Zetia are easily > $200 month each.  She will switch to a new [employer] plan in January.  Patient works 12 hour night shifts at the [employer], and states she walks b/t 4-5 miles daily.  Her diet consists of coffee only for breakfast, crackers for lunch (at work), and a meat, starch, and vegetable for dinner.  .  Family history is strong for cancer, but not significant for premature CAD.  Labs: 03/2014: TC 243, LDL 190, TG 61, HDL 41.2, non HDL 201.8 (on simvastatin 10) 12/2013:  TC 275, LDL 204, TG 102, HDL 51, non-HDL 225 (not on lipid lowering meds)  Current Outpatient Prescriptions  Medication Sig Dispense Refill  . aspirin (ASPIR-LOW) 81 MG EC tablet Take 81 mg by mouth daily. On hold    . clopidogrel (PLAVIX) 75 MG tablet Take 1 tablet (75 mg total) by mouth daily. 90 tablet 3  . escitalopram (LEXAPRO) 10 MG tablet Take 10 mg by mouth daily.  0  . simvastatin (ZOCOR) 20 MG tablet Take 0.5 tablets (10 mg  total) by mouth at bedtime. 30 tablet 5   No current facility-administered medications for this visit.   Allergies  Allergen Reactions  . Codeine Shortness Of Breath, Nausea Only and Swelling    Tongue swells  . Lipitor [Atorvastatin Calcium] Other (See Comments)    Muscle ache   Family History  Problem Relation Age of Onset  . Diabetes Mother   . Hypertension Mother   . Cancer Father   . Obesity Sister   . Cancer Sister

## 2014-06-03 ENCOUNTER — Other Ambulatory Visit: Payer: Self-pay | Admitting: Pharmacist Clinician (PhC)/ Clinical Pharmacy Specialist

## 2014-06-03 ENCOUNTER — Telehealth: Payer: Self-pay | Admitting: Pharmacist Clinician (PhC)/ Clinical Pharmacy Specialist

## 2014-06-03 MED ORDER — ROSUVASTATIN CALCIUM 10 MG PO TABS: 10.0000 mg | ORAL_TABLET | Freq: Every day | ORAL | Status: DC

## 2014-06-03 NOTE — Telephone Encounter (Signed)
-----   Message from Tommy Medal, [place] sent at 04/30/2014  4:30 PM EST ----- Call pt to see if ready for Crestor.  Start 10mg  tiw, then after 3-4 weeks start zetia 10mg  qd.

## 2014-06-03 NOTE — Telephone Encounter (Signed)
Spoke with patient, she is waiting for new insurance cards.  Once she receives them will try to fill Crestor.  Pt is to call if still unaffordable.

## 2014-06-30 ENCOUNTER — Telehealth: Payer: Self-pay | Admitting: Cardiovascular Disease

## 2014-06-30 NOTE — Telephone Encounter (Signed)
Spoke with Sharon [name] at [facility] who called to verify patient's ICD-10 codes since patient was referred by their office.  I reviewed that information with Louie Casa who verbalized understanding and agreement.

## 2014-06-30 NOTE — Telephone Encounter (Signed)
New Message  Randy from [facility] called to make f/u for pt and also wanted to know the diagnostic code we used for pt for her last visit. Please call back and discuss.

## 2014-07-10 ENCOUNTER — Ambulatory Visit: Payer: Self-pay | Admitting: Cardiovascular Disease

## 2014-07-24 LAB — PULMONARY FUNCTION TEST

## 2014-07-25 ENCOUNTER — Ambulatory Visit (INDEPENDENT_AMBULATORY_CARE_PROVIDER_SITE_OTHER): Payer: 59 | Admitting: Cardiovascular Disease

## 2014-07-25 ENCOUNTER — Encounter: Payer: Self-pay | Admitting: Cardiovascular Disease

## 2014-07-25 ENCOUNTER — Other Ambulatory Visit (HOSPITAL_COMMUNITY)
Admission: RE | Admit: 2014-07-25 | Discharge: 2014-07-25 | Disposition: A | Discharge status: Home / Self Care | Payer: 59 | Source: Ambulatory Visit | Attending: Cardiovascular Disease | Admitting: Cardiovascular Disease

## 2014-07-25 VITALS — BP 118/82 | HR 78 | Ht 64.0 in | Wt 140.8 lb

## 2014-07-25 DIAGNOSIS — Z7901 Long term (current) use of anticoagulants: Secondary | ICD-10-CM | POA: Diagnosis not present

## 2014-07-25 DIAGNOSIS — I25119 Atherosclerotic heart disease of native coronary artery with unspecified angina pectoris: Secondary | ICD-10-CM

## 2014-07-25 LAB — PLATELET INHIBITION P2Y12: PLATELET FUNCTION P2Y12: 156 [PRU] — AB (ref 194–418)

## 2014-07-25 MED ORDER — ROSUVASTATIN CALCIUM 10 MG PO TABS: 10.0000 mg | ORAL_TABLET | Freq: Every day | ORAL | Status: DC

## 2014-07-25 NOTE — Patient Instructions (Addendum)
Your physician has recommended you make the following change in your medication:  START Crestor 10 mg once daily   Your physician recommends that you go to [facility] for a P2Y12 assay to determine effectiveness of Plavix  Your physician wants you to follow-up in: 6 months with Dr. Acie Fredrickson.  You will receive a reminder letter in the mail two months in advance. If you don't receive a letter, please call our office to schedule the follow-up appointment.
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
## 2014-08-05 ENCOUNTER — Encounter: Payer: Self-pay | Admitting: *Deleted

## 2014-08-05 DIAGNOSIS — D381 Neoplasm of uncertain behavior of trachea, bronchus and lung: Secondary | ICD-10-CM | POA: Insufficient documentation

## 2014-08-06 ENCOUNTER — Institutional Professional Consult (permissible substitution) (INDEPENDENT_AMBULATORY_CARE_PROVIDER_SITE_OTHER): Payer: 59 | Admitting: Thoracic Surgery (Cardiothoracic Vascular Surgery)

## 2014-08-06 ENCOUNTER — Encounter: Payer: Self-pay | Admitting: Thoracic Surgery (Cardiothoracic Vascular Surgery)

## 2014-08-06 ENCOUNTER — Other Ambulatory Visit: Payer: Self-pay | Admitting: *Deleted

## 2014-08-06 VITALS — BP 123/72 | HR 80 | Resp 20 | Ht 64.0 in | Wt 139.0 lb

## 2014-08-06 DIAGNOSIS — R911 Solitary pulmonary nodule: Secondary | ICD-10-CM

## 2014-08-06 NOTE — Progress Notes (Signed)
[address]       [place] [address]             [phone]       PCP is No primary care provider on file. Referring Provider is Gardiner Rhyme, [facility]: [place]  Chief Complaint  Patient presents with  . Lung Lesion    Surgical eval, PET Scan 07/22/14, Chest CT 07/03/14     HPI: 54 yo woman with a history of tobacco abuse, CAD with prior LAD stent and aortic insufficiency. She has smoked 1- 1.5 ppd since age 26. She was ill in January with fever, congestion and other symptoms suggestive of a respiratory infection. She was treated with antibiotics and a chest x-ray was obtained. It showed a 12 mm nodule in the left lung. A CT showed an 11 mm spiculated nodule in the left lower lobe adjacent to the fissure. She had a PET on 07/18/14 which showed the nodule was hypermetabolic with an SUV of 8.67. There was no evidence of regional or distant metastases.  She gets SOB with exertion- walking a mile or two flights of stairs. She has a chronic cough. She denies hemoptysis. She has lost 8 pounds over the past 3 months due to a poor appetite. She has right sided CW pain due to a rib fracture. She is down to about 1/2 ppd smoking. She does have "tension" headaches, but these have been going on for years.   Zubrod Score: At the time of surgery this patient's most appropriate activity status/level should be described as: []     0    Normal activity, no symptoms [x]     1    Restricted in physical strenuous activity but ambulatory, able to do out light work []     2    Ambulatory and capable of self care, unable to do work activities, up and about >50 % of waking hours                              []     3    Only limited self care, in bed greater than 50% of waking hours []     4    Completely disabled, no self care, confined to bed or chair []     5    Moribund   Past Medical History  Diagnosis Date  . Ischemic cardiomyopathy   . Hyperlipidemia   . Tobacco  user   . Coronary artery disease   . Occlusion of right coronary artery   . Aortic insufficiency     moderate by echo in 2009  . Acute ST segment elevation MI   . 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 S/P coronary artery stent placement     2009    Past Surgical History  Procedure Laterality Date  . Cholecystectomy    . Tubal ligation    . Ectopic pregnancies requiring laparotomies    . Cardiac catheterization  10/15/2007    EF 30-40%  . Coronary angioplasty with stent placement      LAD  . US echocardiography  01/07/2008    EF 55-60%  . Transthoracic echocardiogram  10/17/2007  . Cesarean section      Family History  Problem Relation Age of Onset  . Diabetes Mother   . Hypertension  Mother   . Cancer Father     pancreatic and esophageal  . Obesity Sister   . Cancer Sister     breast    Social History History  Substance Use Topics  . Smoking status: Current Every Day Smoker -- 1.00 packs/day for 36 years    Types: Cigarettes  . Smokeless tobacco: Not on file  . Alcohol Use: Yes     Comment: occasional    Current Outpatient Prescriptions  Medication Sig Dispense Refill  . alendronate (FOSAMAX) 70 MG tablet Take 70 mg by mouth once a week.  0  . aspirin (ASPIR-LOW) 81 MG EC tablet Take 81 mg by mouth daily. On hold    . buPROPion (WELLBUTRIN SR) 150 MG 12 hr tablet Take 150 mg by mouth 2 (two) times daily.   0  . Cholecalciferol (VITAMIN D-3) 1000 UNITS CAPS Take 1 capsule by mouth daily.    . clopidogrel (PLAVIX) 75 MG tablet Take 1 tablet (75 mg total) by mouth daily. 90 tablet 3  . Cyanocobalamin (VITAMIN B 12 PO) Take by mouth daily.    . nicotine (NICODERM CQ - DOSED IN MG/24 HOURS) 21 mg/24hr patch Place 21 mg onto the skin daily.    . rosuvastatin (CRESTOR) 10 MG tablet Take 1 tablet (10 mg total) by mouth daily. 90 tablet 3  . vitamin E 400 UNIT capsule Take 400 Units by mouth daily.       No current facility-administered medications for this visit.    Allergies  Allergen Reactions  . Codeine Shortness Of Breath, Nausea Only and Swelling    Tongue swells  . Lipitor [Atorvastatin Calcium] Other (See Comments)    Muscle ache    Review of Systems  Constitutional: Positive for appetite change, fatigue and unexpected weight change (has lost 8 pounds in 3 months, 13 pounds in 6 months). Negative for fever.  Respiratory: Positive for cough (productive, no hemoptysis).   Cardiovascular: Positive for palpitations and leg swelling. Negative for chest pain.       Varicose veins and poor circulation in legs. Carotid stensosis- asymptomatic  Gastrointestinal: Negative.   Musculoskeletal: Positive for back pain and arthralgias.       Pain in leg with walking- "in the bones"- better after starting fosamax  Neurological: Positive for headaches ("tension").  Hematological: Bruises/bleeds easily (plavix).  Psychiatric/Behavioral: Positive for dysphoric mood. The patient is nervous/anxious.   All other systems reviewed and are negative.   BP 123/72 mmHg  Pulse 80  Resp 20  Ht 5\' 4"  (1.626 m)  Wt 139 lb (63.05 kg)  BMI 23.85 kg/m2  SpO2 94% Physical Exam  Constitutional: She is oriented to person, place, and time. She appears well-developed and well-nourished. No distress.  HENT:  Head: Normocephalic and atraumatic.  Eyes: EOM are normal. Pupils are equal, round, and reactive to light.  Neck: No thyromegaly present.  Bilateral carotid bruits  Cardiovascular: Normal rate, regular rhythm and intact distal pulses.   Murmur (systolic and diastolic components at RUSB) heard. Pulmonary/Chest: Effort normal and breath sounds normal. She has no wheezes. She has no rales.  Abdominal: Soft. There is no tenderness.  Musculoskeletal: She exhibits no edema.  Lymphadenopathy:    She has no cervical adenopathy.  Neurological: She is alert and oriented to person, place, and time. No  cranial nerve deficit.  Skin: Skin is warm and dry.  Psychiatric:  Anxious/ tearful  Vitals reviewed.    Diagnostic Tests: I personally reviewed the CXR, chest  CT and PET films. There is a spiculated nodule in the left lower lobe ~ 11 mm in diameter. It is hypermetabolic.   FVC= 3.16(82%) FEV1= 2.45(82%) No significant improvement with bronchodilators  Impression: 54 yo smoker with a new 11 mm left lower lobe nodule. This is highly suspicious for a primary bronchogenic carcinoma and has to be considered that unless it can be proven otherwise.  I reviewed the scans with Sharon [name] and her daughter. We discussed the differential as well as diagnostic and treatment algorithms. We discussed bronchoscopy, CT guided biopsy and surgical biopsy. The only one of those I would trust is an excisional biopsy.  I recommended that we proceed with a left VATS, wedge resection and possible left lower lobectomy for definitive diagnosis and treatment.  I have discussed the general nature of the procedure, the need for general anesthesia, and the incisions to be used with Sharon [name] and her daughter. I discussed the expected hospital stay, overall recovery and short and long term outcomes. I reviewed the indications, risks, benefits adn alternatives. They understand the risks include but are not limited to death, stroke, MI, DVT/PE, bleeding, possible need for transfusion, infections, prolonged air leaks, cardiac arrhythmias, as well as the possibility of unforeseeable complications.  She accept the risks and agrees to proceed.  We also discussed the use of cryo-analgesia and it's risks/ benefits. She wishes to have that done at the time of surgery.  She will hold her plavix for 4 days prior to surgery as approved by Dr. Acie Fredrickson.  Plan: Hold Plavix after dose this Saturday  Left VATS, wedge resection, possible left lower lobectomy, cryoanalgesia of intercostal nerves on Thursday 08/14/14  Melrose Nakayama, MD [facility] [phone]

## 2014-08-07 ENCOUNTER — Encounter: Payer: Self-pay | Admitting: *Deleted

## 2014-08-07 NOTE — [facility] Oncology Navigator Note (Unsigned)
Received referral on Ms. Sharon [name].  I see that she is already set up to see thoracic surgery.  I spoke with Ebony Hail at [facility] and she stated patient is having surgery on 08/14/14.  There is no need for med onc at this time.

## 2014-08-12 ENCOUNTER — Encounter (HOSPITAL_COMMUNITY): Payer: Self-pay

## 2014-08-12 ENCOUNTER — Other Ambulatory Visit: Payer: Self-pay

## 2014-08-12 ENCOUNTER — Encounter (HOSPITAL_COMMUNITY)
Admission: RE | Admit: 2014-08-12 | Discharge: 2014-08-12 | Disposition: A | Discharge status: Home / Self Care | Payer: 59 | Source: Ambulatory Visit | Attending: Thoracic Surgery (Cardiothoracic Vascular Surgery) | Admitting: Thoracic Surgery (Cardiothoracic Vascular Surgery)

## 2014-08-12 VITALS — BP 107/65 | HR 66 | Temp 98.5°F | Resp 20 | Ht 64.0 in | Wt 139.1 lb

## 2014-08-12 DIAGNOSIS — Z01818 Encounter for other preprocedural examination: Secondary | ICD-10-CM | POA: Insufficient documentation

## 2014-08-12 DIAGNOSIS — Z01812 Encounter for preprocedural laboratory examination: Secondary | ICD-10-CM

## 2014-08-12 DIAGNOSIS — R911 Solitary pulmonary nodule: Secondary | ICD-10-CM

## 2014-08-12 HISTORY — DX: Pneumonia, unspecified organism: J18.9

## 2014-08-12 HISTORY — DX: Emphysema, unspecified: J43.9

## 2014-08-12 HISTORY — DX: Unspecified osteoarthritis, unspecified site: M19.90

## 2014-08-12 HISTORY — DX: Chronic obstructive pulmonary disease, unspecified: J44.9

## 2014-08-12 LAB — COMPREHENSIVE METABOLIC PANEL
ALT: 17 U/L (ref 0–35)
ANION GAP: 9 (ref 5–15)
AST: 27 U/L (ref 0–37)
Albumin: 4.1 g/dL (ref 3.5–5.2)
Alkaline Phosphatase: 77 U/L (ref 39–117)
BUN: 13 mg/dL (ref 6–23)
CALCIUM: 9.4 mg/dL (ref 8.4–10.5)
CO2: 21 mmol/L (ref 19–32)
Chloride: 107 mmol/L (ref 96–112)
Creatinine, Ser: 0.72 mg/dL (ref 0.50–1.10)
GFR calc Af Amer: >90 mL/min (ref >90)
GFR calc non Af Amer: >90 mL/min (ref >90)
GLUCOSE: 103 mg/dL — AB (ref 70–99)
Potassium: 4.3 mmol/L (ref 3.5–5.1)
Sodium: 137 mmol/L (ref 135–145)
Total Bilirubin: 0.7 mg/dL (ref 0.3–1.2)
Total Protein: 6.9 g/dL (ref 6.0–8.3)

## 2014-08-12 LAB — URINALYSIS, ROUTINE W REFLEX MICROSCOPIC
BILIRUBIN URINE: NEGATIVE
Glucose, UA: NEGATIVE mg/dL
Hgb urine dipstick: NEGATIVE
KETONES UR: NEGATIVE mg/dL
Leukocytes, UA: NEGATIVE
Nitrite: NEGATIVE
PH: 5.5 (ref 5.0–8.0)
Protein, ur: NEGATIVE mg/dL
Specific Gravity, Urine: 1.009 (ref 1.005–1.030)
UROBILINOGEN UA: 0.2 mg/dL (ref 0.0–1.0)

## 2014-08-12 LAB — ABO/RH: ABO/RH(D): A POS

## 2014-08-12 LAB — SURGICAL PCR SCREEN
MRSA, PCR: NEGATIVE
Staphylococcus aureus: NEGATIVE

## 2014-08-12 LAB — BLOOD GAS, ARTERIAL
Acid-base deficit: 0.8 mmol/L (ref 0.0–2.0)
Bicarbonate: 22.5 mEq/L (ref 20.0–24.0)
FIO2: 0.21 %
O2 SAT: 97.5 %
PCO2 ART: 31.6 mmHg — AB (ref 35.0–45.0)
Patient temperature: 98.6
TCO2: 23.5 mmol/L (ref 0–100)
pH, Arterial: 7.467 — ABNORMAL HIGH (ref 7.350–7.450)
pO2, Arterial: 94.1 mmHg (ref 80.0–100.0)

## 2014-08-12 LAB — PROTIME-INR
INR: 0.99 (ref 0.00–1.49)
Prothrombin Time: 13.1 seconds (ref 11.6–15.2)

## 2014-08-12 LAB — CBC
HCT: 45.5 % (ref 36.0–46.0)
Hemoglobin: 15.4 g/dL — ABNORMAL HIGH (ref 12.0–15.0)
MCH: 32.6 pg (ref 26.0–34.0)
MCHC: 33.8 g/dL (ref 30.0–36.0)
MCV: 96.2 fL (ref 78.0–100.0)
PLATELETS: 182 10*3/uL (ref 150–400)
RBC: 4.73 MIL/uL (ref 3.87–5.11)
RDW: 13.7 % (ref 11.5–15.5)
WBC: 6.4 10*3/uL (ref 4.0–10.5)

## 2014-08-12 LAB — TYPE AND SCREEN
ABO/RH(D): A POS
ANTIBODY SCREEN: NEGATIVE

## 2014-08-12 LAB — APTT: aPTT: 31 seconds (ref 24–37)

## 2014-08-12 NOTE — Pre-Procedure Instructions (Signed)
Sharon [name]  08/12/2014   Your procedure is scheduled on:  Thursday  08/14/14  Report to [facility] Admitting at 600 AM.  Call this number if you have problems the morning of surgery: [phone]   Remember:   Do not eat food or drink liquids after midnight.   Take these medicines the morning of surgery with A SIP OF WATER:  BUPROPION (WELLBUTRIN)     (STOP PLAVIX, VITAMIN E )   Do not wear jewelry, make-up or nail polish.  Do not wear lotions, powders, or perfumes. You may wear deodorant.  Do not shave 48 hours prior to surgery. Men may shave face and neck.  Do not bring valuables to the hospital.  [facility] is not responsible                  for any belongings or valuables.               Contacts, dentures or bridgework may not be worn into surgery.  Leave suitcase in the car. After surgery it may be brought to your room.  For patients admitted to the hospital, discharge time is determined by your                treatment team.               Patients discharged the day of surgery will not be allowed to drive  home.  Name and phone number of your driver:   Special Instructions: Sharon [name] - Preparing for Surgery  Before surgery, you can play an important role.  Because skin is not sterile, your skin needs to be as free of germs as possible.  You can reduce the number of germs on you skin by washing with CHG (chlorahexidine gluconate) soap before surgery.  CHG is an antiseptic cleaner which kills germs and bonds with the skin to continue killing germs even after washing.  Please DO NOT use if you have an allergy to CHG or antibacterial soaps.  If your skin becomes reddened/irritated stop using the CHG and inform your nurse when you arrive at Short Stay.  Do not shave (including legs and underarms) for at least 48 hours prior to the first CHG shower.  You may shave your face.  Please follow these instructions carefully:   1.  Shower with CHG Soap the night before  surgery and the                                morning of Surgery.  2.  If you choose to wash your hair, wash your hair first as usual with your       normal shampoo.  3.  After you shampoo, rinse your hair and body thoroughly to remove the                      Shampoo.  4.  Use CHG as you would any other liquid soap.  You can apply chg directly       to the skin and wash gently with scrungie or a clean washcloth.  5.  Apply the CHG Soap to your body ONLY FROM THE NECK DOWN.        Do not use on open wounds or open sores.  Avoid contact with your eyes,       ears, mouth and genitals (private parts).  Wash  genitals (private parts)       with your normal soap.  6.  Wash thoroughly, paying special attention to the area where your surgery        will be performed.  7.  Thoroughly rinse your body with warm water from the neck down.  8.  DO NOT shower/wash with your normal soap after using and rinsing off       the CHG Soap.  9.  Pat yourself dry with a clean towel.            10.  Wear clean pajamas.            11.  Place clean sheets on your bed the night of your first shower and do not        sleep with pets.  Day of Surgery  Do not apply any lotions/deoderants the morning of surgery.  Please wear clean clothes to the hospital/surgery center.     Please read over the following fact sheets that you were given: Pain Booklet, Coughing and Deep Breathing, Blood Transfusion Information, MRSA Information and Surgical Site Infection Prevention

## 2014-08-13 MED ORDER — DEXTROSE 5 % IV SOLN
1.5000 g | INTRAVENOUS | Status: AC
  Administered 2014-08-14: 1.5 g via INTRAVENOUS
  Filled 2014-08-13: qty 1.5

## 2014-08-13 NOTE — Progress Notes (Addendum)
Anesthesia Chart Review:  Patient is a 54 year old female scheduled for left VATS, wedge resection, possible LL lobectomy, cryoanalgesia of intercostal nerves on 08/14/14 by Dr. Roxan Hockey. She has a suspicious hypermetabolic lung lesion for primary bronchogenic carcinoma and needs surgery for definitive diagnosis.  History includes recent former smoker, COPD/emphysema, CAD/STEMI s/p Promus stent proximal LAD '09 with known RCA occlusion with collaterals from the left filling the distal right, ischemic CM, AR, HLD. Cardiologist is Dr. Acie Fredrickson, with recent visit on 07/25/14. He felt she would be low risk for cardiac complications during surgery and gave permission to hold Plavix for 7 days.  Of note, she has been found to be an "intermediate metabolizer of Plavix."  01/20/14 EKG: NSR. Septal infarct (old).  01/07/08 Echo: Normal LV systolic function. Anterior wall akinesis has improved. LVEF 55-60%. Impaired LV relaxation. Calcified AV, mild AS, moderate AI. Trace MR/TR.   10/16/07 Cardiac cath: LM fairly normal. 99% proximal LAD s/p Promus stent. D1 is a branching vessel with one occlude and the other severely diseased.  LCX is large. OM1 minor luminal irregularities. RCA chronically occluded proximally with collaterals from the left filling the distal right. Anteroapical dyskinesis. Mid anterior wall hypokinesis. EF 30-40%.    01/22/14 carotid duplex: heterogeneous plaque bilterally. 40-59% RICA stenosis. 6-33% LICA stenosis. Normal SCA, bilaterally. Patent vertebral arteries with antegrade flow.   2/2/516 PFTs: FVC= 3.16(82%) FEV1= 2.45(82%) No significant improvement with bronchodilators.  Preoperative CXR and labs noted.   If no acute changes then I would anticipate that she could proceed as planned.  George Hugh [facility] Short Stay Center/Anesthesiology Phone [phone] 08/13/2014 11:39 AM

## 2014-08-14 ENCOUNTER — Inpatient Hospital Stay (HOSPITAL_COMMUNITY): Payer: 59

## 2014-08-14 ENCOUNTER — Inpatient Hospital Stay (HOSPITAL_COMMUNITY): Payer: 59 | Admitting: Vascular Surgery

## 2014-08-14 ENCOUNTER — Encounter (HOSPITAL_COMMUNITY): Payer: Self-pay | Admitting: *Deleted

## 2014-08-14 ENCOUNTER — Inpatient Hospital Stay (HOSPITAL_COMMUNITY)
Admission: RE | Admit: 2014-08-14 | Discharge: 2014-08-18 | DRG: 164 | Disposition: A | Discharge status: Home / Self Care | Payer: 59 | Source: Ambulatory Visit | Attending: Thoracic Surgery (Cardiothoracic Vascular Surgery) | Admitting: Thoracic Surgery (Cardiothoracic Vascular Surgery)

## 2014-08-14 ENCOUNTER — Encounter (HOSPITAL_COMMUNITY)
Admission: RE | Disposition: A | Discharge status: Home / Self Care | Payer: Self-pay | Source: Ambulatory Visit | Attending: Thoracic Surgery (Cardiothoracic Vascular Surgery)

## 2014-08-14 ENCOUNTER — Inpatient Hospital Stay (HOSPITAL_COMMUNITY): Payer: 59 | Admitting: Critical Care Medicine

## 2014-08-14 DIAGNOSIS — M1990 Unspecified osteoarthritis, unspecified site: Secondary | ICD-10-CM | POA: Diagnosis present

## 2014-08-14 DIAGNOSIS — Z888 Allergy status to other drugs, medicaments and biological substances status: Secondary | ICD-10-CM

## 2014-08-14 DIAGNOSIS — F17210 Nicotine dependence, cigarettes, uncomplicated: Secondary | ICD-10-CM | POA: Diagnosis present

## 2014-08-14 DIAGNOSIS — J9382 Other air leak: Secondary | ICD-10-CM | POA: Diagnosis not present

## 2014-08-14 DIAGNOSIS — R112 Nausea with vomiting, unspecified: Secondary | ICD-10-CM | POA: Diagnosis not present

## 2014-08-14 DIAGNOSIS — Z09 Encounter for follow-up examination after completed treatment for conditions other than malignant neoplasm: Secondary | ICD-10-CM

## 2014-08-14 DIAGNOSIS — S2239XA Fracture of one rib, unspecified side, initial encounter for closed fracture: Secondary | ICD-10-CM | POA: Diagnosis present

## 2014-08-14 DIAGNOSIS — J939 Pneumothorax, unspecified: Secondary | ICD-10-CM | POA: Diagnosis not present

## 2014-08-14 DIAGNOSIS — C3432 Malignant neoplasm of lower lobe, left bronchus or lung: Secondary | ICD-10-CM | POA: Diagnosis present

## 2014-08-14 DIAGNOSIS — R21 Rash and other nonspecific skin eruption: Secondary | ICD-10-CM | POA: Diagnosis not present

## 2014-08-14 DIAGNOSIS — J449 Chronic obstructive pulmonary disease, unspecified: Secondary | ICD-10-CM | POA: Diagnosis present

## 2014-08-14 DIAGNOSIS — I351 Nonrheumatic aortic (valve) insufficiency: Secondary | ICD-10-CM | POA: Diagnosis present

## 2014-08-14 DIAGNOSIS — R630 Anorexia: Secondary | ICD-10-CM | POA: Diagnosis present

## 2014-08-14 DIAGNOSIS — Z79899 Other long term (current) drug therapy: Secondary | ICD-10-CM

## 2014-08-14 DIAGNOSIS — Z7982 Long term (current) use of aspirin: Secondary | ICD-10-CM

## 2014-08-14 DIAGNOSIS — R911 Solitary pulmonary nodule: Secondary | ICD-10-CM | POA: Diagnosis present

## 2014-08-14 DIAGNOSIS — Z955 Presence of coronary angioplasty implant and graft: Secondary | ICD-10-CM | POA: Diagnosis not present

## 2014-08-14 DIAGNOSIS — D649 Anemia, unspecified: Secondary | ICD-10-CM | POA: Diagnosis present

## 2014-08-14 DIAGNOSIS — J9811 Atelectasis: Secondary | ICD-10-CM | POA: Diagnosis not present

## 2014-08-14 DIAGNOSIS — Z885 Allergy status to narcotic agent status: Secondary | ICD-10-CM

## 2014-08-14 DIAGNOSIS — Z902 Acquired absence of lung [part of]: Secondary | ICD-10-CM

## 2014-08-14 DIAGNOSIS — E785 Hyperlipidemia, unspecified: Secondary | ICD-10-CM | POA: Diagnosis present

## 2014-08-14 DIAGNOSIS — Z7983 Long term (current) use of bisphosphonates: Secondary | ICD-10-CM | POA: Diagnosis not present

## 2014-08-14 DIAGNOSIS — Z7902 Long term (current) use of antithrombotics/antiplatelets: Secondary | ICD-10-CM

## 2014-08-14 DIAGNOSIS — C3490 Malignant neoplasm of unspecified part of unspecified bronchus or lung: Secondary | ICD-10-CM

## 2014-08-14 DIAGNOSIS — R51 Headache: Secondary | ICD-10-CM | POA: Diagnosis present

## 2014-08-14 DIAGNOSIS — I2510 Atherosclerotic heart disease of native coronary artery without angina pectoris: Secondary | ICD-10-CM | POA: Diagnosis present

## 2014-08-14 HISTORY — PX: LOBECTOMY: SHX5089

## 2014-08-14 HISTORY — PX: VIDEO ASSISTED THORACOSCOPY (VATS)/WEDGE RESECTION: SHX6174

## 2014-08-14 HISTORY — PX: NODE DISSECTION: SHX5269

## 2014-08-14 HISTORY — PX: LEAD REMOVAL: SHX5944

## 2014-08-14 LAB — GLUCOSE, CAPILLARY
Glucose-Capillary: 145 mg/dL — ABNORMAL HIGH (ref 70–99)
Glucose-Capillary: 117 mg/dL — ABNORMAL HIGH (ref 70–99)

## 2014-08-14 SURGERY — VIDEO ASSISTED THORACOSCOPY (VATS)/WEDGE RESECTION
Anesthesia: General | Site: Chest | Laterality: Left

## 2014-08-14 MED ORDER — INSULIN ASPART 100 UNIT/ML ~~LOC~~ SOLN
0.0000 [IU] | Freq: Three times a day (TID) | SUBCUTANEOUS | Status: DC
  Administered 2014-08-14: 2 [IU] via SUBCUTANEOUS

## 2014-08-14 MED ORDER — ONDANSETRON HCL 4 MG/2ML IJ SOLN
4.0000 mg | Freq: Four times a day (QID) | INTRAMUSCULAR | Status: DC | PRN
  Administered 2014-08-14 – 2014-08-15 (×3): 4 mg via INTRAVENOUS
  Filled 2014-08-14 (×2): qty 2

## 2014-08-14 MED ORDER — ROCURONIUM BROMIDE 100 MG/10ML IV SOLN
INTRAVENOUS | Status: DC | PRN
  Administered 2014-08-14 (×3): 10 mg via INTRAVENOUS
  Administered 2014-08-14: 40 mg via INTRAVENOUS

## 2014-08-14 MED ORDER — HYDROMORPHONE HCL 1 MG/ML IJ SOLN
INTRAMUSCULAR | Status: AC
  Filled 2014-08-14: qty 2

## 2014-08-14 MED ORDER — ROCURONIUM BROMIDE 50 MG/5ML IV SOLN
INTRAVENOUS | Status: AC
  Filled 2014-08-14: qty 1

## 2014-08-14 MED ORDER — PHENYLEPHRINE HCL 10 MG/ML IJ SOLN
10.0000 mg | INTRAVENOUS | Status: DC | PRN
  Administered 2014-08-14: 10 ug/min via INTRAVENOUS

## 2014-08-14 MED ORDER — PROPOFOL 10 MG/ML IV BOLUS
INTRAVENOUS | Status: AC
  Filled 2014-08-14: qty 20

## 2014-08-14 MED ORDER — POTASSIUM CHLORIDE IN NACL 20-0.9 MEQ/L-% IV SOLN
INTRAVENOUS | Status: DC
  Administered 2014-08-14 – 2014-08-15 (×3): via INTRAVENOUS
  Filled 2014-08-14 (×7): qty 1000

## 2014-08-14 MED ORDER — POTASSIUM CHLORIDE 10 MEQ/50ML IV SOLN: 10.0000 meq | Freq: Every day | INTRAVENOUS | Status: DC | PRN

## 2014-08-14 MED ORDER — SODIUM CHLORIDE 0.9 % IJ SOLN
INTRAMUSCULAR | Status: AC
  Filled 2014-08-14: qty 10

## 2014-08-14 MED ORDER — MIDAZOLAM HCL 5 MG/5ML IJ SOLN
INTRAMUSCULAR | Status: DC | PRN
  Administered 2014-08-14: 2 mg via INTRAVENOUS

## 2014-08-14 MED ORDER — FENTANYL CITRATE 0.05 MG/ML IJ SOLN
INTRAMUSCULAR | Status: DC | PRN
  Administered 2014-08-14 (×3): 50 ug via INTRAVENOUS
  Administered 2014-08-14: 25 ug via INTRAVENOUS
  Administered 2014-08-14: 50 ug via INTRAVENOUS
  Administered 2014-08-14: 25 ug via INTRAVENOUS
  Administered 2014-08-14: 50 ug via INTRAVENOUS
  Administered 2014-08-14 (×3): 25 ug via INTRAVENOUS
  Administered 2014-08-14: 50 ug via INTRAVENOUS
  Administered 2014-08-14 (×3): 25 ug via INTRAVENOUS

## 2014-08-14 MED ORDER — DEXAMETHASONE SODIUM PHOSPHATE 4 MG/ML IJ SOLN
INTRAMUSCULAR | Status: DC | PRN
  Administered 2014-08-14: 4 mg via INTRAVENOUS

## 2014-08-14 MED ORDER — PROMETHAZINE HCL 25 MG/ML IJ SOLN
6.2500 mg | INTRAMUSCULAR | Status: DC | PRN
Start: 2014-08-14 — End: 2014-08-14

## 2014-08-14 MED ORDER — LIDOCAINE HCL (CARDIAC) 20 MG/ML IV SOLN
INTRAVENOUS | Status: AC
  Filled 2014-08-14: qty 5

## 2014-08-14 MED ORDER — EPHEDRINE SULFATE 50 MG/ML IJ SOLN
INTRAMUSCULAR | Status: AC
  Filled 2014-08-14: qty 1

## 2014-08-14 MED ORDER — PROPOFOL 10 MG/ML IV BOLUS
INTRAVENOUS | Status: DC | PRN
  Administered 2014-08-14: 140 mg via INTRAVENOUS

## 2014-08-14 MED ORDER — DEXAMETHASONE SODIUM PHOSPHATE 4 MG/ML IJ SOLN
INTRAMUSCULAR | Status: AC
  Filled 2014-08-14: qty 1

## 2014-08-14 MED ORDER — NICOTINE 21 MG/24HR TD PT24
21.0000 mg | MEDICATED_PATCH | Freq: Every day | TRANSDERMAL | Status: DC
  Administered 2014-08-14 – 2014-08-18 (×5): 21 mg via TRANSDERMAL
  Filled 2014-08-14 (×5): qty 1

## 2014-08-14 MED ORDER — DEXTROSE 5 % IV SOLN
1.5000 g | Freq: Two times a day (BID) | INTRAVENOUS | Status: AC
  Administered 2014-08-14 – 2014-08-15 (×2): 1.5 g via INTRAVENOUS
  Filled 2014-08-14 (×2): qty 1.5

## 2014-08-14 MED ORDER — MIDAZOLAM HCL 2 MG/2ML IJ SOLN
INTRAMUSCULAR | Status: AC
  Filled 2014-08-14: qty 2

## 2014-08-14 MED ORDER — DIPHENHYDRAMINE HCL 50 MG/ML IJ SOLN: 12.5000 mg | Freq: Four times a day (QID) | INTRAMUSCULAR | Status: DC | PRN

## 2014-08-14 MED ORDER — HYDROMORPHONE 0.3 MG/ML IV SOLN
INTRAVENOUS | Status: DC
Start: 2014-08-14 — End: 2014-08-15
  Administered 2014-08-14: 0.3 mg via INTRAVENOUS
  Administered 2014-08-14: 13:00:00 via INTRAVENOUS
  Administered 2014-08-15: 1.2 mg via INTRAVENOUS
  Administered 2014-08-15: 0.3 mg via INTRAVENOUS

## 2014-08-14 MED ORDER — ALBUTEROL SULFATE (2.5 MG/3ML) 0.083% IN NEBU: 2.5000 mg | INHALATION_SOLUTION | RESPIRATORY_TRACT | Status: DC | PRN

## 2014-08-14 MED ORDER — FENTANYL CITRATE 0.05 MG/ML IJ SOLN
INTRAMUSCULAR | Status: AC
  Filled 2014-08-14: qty 5

## 2014-08-14 MED ORDER — GLYCOPYRROLATE 0.2 MG/ML IJ SOLN
INTRAMUSCULAR | Status: DC | PRN
  Administered 2014-08-14: 0.4 mg via INTRAVENOUS

## 2014-08-14 MED ORDER — LACTATED RINGERS IV SOLN
INTRAVENOUS | Status: DC | PRN
  Administered 2014-08-14 (×2): via INTRAVENOUS

## 2014-08-14 MED ORDER — HEMOSTATIC AGENTS (NO CHARGE) OPTIME
TOPICAL | Status: DC | PRN
  Administered 2014-08-14: 1 via TOPICAL

## 2014-08-14 MED ORDER — ASPIRIN 81 MG PO CHEW: 81.0000 mg | CHEWABLE_TABLET | Freq: Every day | ORAL | Status: DC

## 2014-08-14 MED ORDER — TRAMADOL HCL 50 MG PO TABS: 50.0000 mg | ORAL_TABLET | Freq: Four times a day (QID) | ORAL | Status: DC | PRN

## 2014-08-14 MED ORDER — GLYCOPYRROLATE 0.2 MG/ML IJ SOLN
INTRAMUSCULAR | Status: AC
  Filled 2014-08-14: qty 2

## 2014-08-14 MED ORDER — KETOROLAC TROMETHAMINE 30 MG/ML IJ SOLN
30.0000 mg | Freq: Four times a day (QID) | INTRAMUSCULAR | Status: AC
  Administered 2014-08-14 – 2014-08-16 (×7): 30 mg via INTRAVENOUS
  Filled 2014-08-14 (×7): qty 1

## 2014-08-14 MED ORDER — DIPHENHYDRAMINE HCL 12.5 MG/5ML PO ELIX
12.5000 mg | ORAL_SOLUTION | Freq: Four times a day (QID) | ORAL | Status: DC | PRN
  Filled 2014-08-14: qty 5

## 2014-08-14 MED ORDER — HYDROMORPHONE 0.3 MG/ML IV SOLN
INTRAVENOUS | Status: AC
  Filled 2014-08-14: qty 25

## 2014-08-14 MED ORDER — ROSUVASTATIN CALCIUM 10 MG PO TABS
10.0000 mg | ORAL_TABLET | Freq: Every day | ORAL | Status: DC
  Administered 2014-08-14 – 2014-08-16 (×3): 10 mg via ORAL
  Filled 2014-08-14 (×5): qty 1

## 2014-08-14 MED ORDER — NALOXONE HCL 0.4 MG/ML IJ SOLN: 0.4000 mg | INTRAMUSCULAR | Status: DC | PRN

## 2014-08-14 MED ORDER — ENOXAPARIN SODIUM 40 MG/0.4ML ~~LOC~~ SOLN
40.0000 mg | Freq: Every day | SUBCUTANEOUS | Status: DC
  Administered 2014-08-15 – 2014-08-18 (×4): 40 mg via SUBCUTANEOUS
  Filled 2014-08-14 (×4): qty 0.4

## 2014-08-14 MED ORDER — ACETAMINOPHEN 160 MG/5ML PO SOLN
1000.0000 mg | Freq: Four times a day (QID) | ORAL | Status: DC
  Administered 2014-08-14: 1000 mg via ORAL
  Filled 2014-08-14 (×19): qty 40

## 2014-08-14 MED ORDER — NEOSTIGMINE METHYLSULFATE 10 MG/10ML IV SOLN
INTRAVENOUS | Status: DC | PRN
  Administered 2014-08-14: 3 mg via INTRAVENOUS

## 2014-08-14 MED ORDER — LACTATED RINGERS IV SOLN
INTRAVENOUS | Status: DC | PRN
  Administered 2014-08-14: 07:00:00 via INTRAVENOUS

## 2014-08-14 MED ORDER — ASPIRIN EC 81 MG PO TBEC
81.0000 mg | DELAYED_RELEASE_TABLET | Freq: Every day | ORAL | Status: DC
  Administered 2014-08-14 – 2014-08-18 (×5): 81 mg via ORAL
  Filled 2014-08-14 (×5): qty 1

## 2014-08-14 MED ORDER — ACETAMINOPHEN 500 MG PO TABS
1000.0000 mg | ORAL_TABLET | Freq: Four times a day (QID) | ORAL | Status: DC
  Administered 2014-08-14 – 2014-08-17 (×11): 1000 mg via ORAL
  Filled 2014-08-14 (×19): qty 2

## 2014-08-14 MED ORDER — SENNOSIDES-DOCUSATE SODIUM 8.6-50 MG PO TABS
1.0000 | ORAL_TABLET | Freq: Every day | ORAL | Status: DC
  Administered 2014-08-14 – 2014-08-17 (×3): 1 via ORAL
  Filled 2014-08-14 (×5): qty 1

## 2014-08-14 MED ORDER — HYDROMORPHONE HCL 1 MG/ML IJ SOLN
0.2500 mg | INTRAMUSCULAR | Status: DC | PRN
  Administered 2014-08-14 (×4): 0.5 mg via INTRAVENOUS

## 2014-08-14 MED ORDER — ARTIFICIAL TEARS OP OINT
TOPICAL_OINTMENT | OPHTHALMIC | Status: AC
  Filled 2014-08-14: qty 3.5

## 2014-08-14 MED ORDER — LIDOCAINE HCL (CARDIAC) 20 MG/ML IV SOLN
INTRAVENOUS | Status: DC | PRN
  Administered 2014-08-14: 60 mg via INTRAVENOUS

## 2014-08-14 MED ORDER — BISACODYL 5 MG PO TBEC
10.0000 mg | DELAYED_RELEASE_TABLET | Freq: Every day | ORAL | Status: DC
  Administered 2014-08-15 – 2014-08-16 (×2): 10 mg via ORAL
  Filled 2014-08-14 (×3): qty 2

## 2014-08-14 MED ORDER — SODIUM CHLORIDE 0.9 % IJ SOLN: 9.0000 mL | INTRAMUSCULAR | Status: DC | PRN

## 2014-08-14 MED ORDER — ONDANSETRON HCL 4 MG/2ML IJ SOLN
INTRAMUSCULAR | Status: DC | PRN
  Administered 2014-08-14: 4 mg via INTRAVENOUS

## 2014-08-14 MED ORDER — CLOPIDOGREL BISULFATE 75 MG PO TABS
75.0000 mg | ORAL_TABLET | Freq: Every day | ORAL | Status: DC
  Administered 2014-08-15 – 2014-08-18 (×4): 75 mg via ORAL
  Filled 2014-08-14 (×4): qty 1

## 2014-08-14 MED ORDER — BUPROPION HCL ER (SR) 150 MG PO TB12
150.0000 mg | ORAL_TABLET | Freq: Two times a day (BID) | ORAL | Status: DC
  Administered 2014-08-14 – 2014-08-18 (×8): 150 mg via ORAL
  Filled 2014-08-14 (×9): qty 1

## 2014-08-14 MED ORDER — 0.9 % SODIUM CHLORIDE (POUR BTL) OPTIME
TOPICAL | Status: DC | PRN
  Administered 2014-08-14: 3000 mL

## 2014-08-14 MED ORDER — SUCCINYLCHOLINE CHLORIDE 20 MG/ML IJ SOLN
INTRAMUSCULAR | Status: AC
  Filled 2014-08-14: qty 1

## 2014-08-14 SURGICAL SUPPLY — 91 items
ADH SKN CLS APL DERMABOND .7 (GAUZE/BANDAGES/DRESSINGS)
BAG SPEC RTRVL LRG 6X4 10 (ENDOMECHANICALS) ×2
CANISTER SUCTION 2500CC (MISCELLANEOUS) ×8 IMPLANT
CATH THORACIC 28FR (CATHETERS) ×2 IMPLANT
CATH THORACIC 36FR (CATHETERS) IMPLANT
CATH THORACIC 36FR RT ANG (CATHETERS) IMPLANT
CLIP TI MEDIUM 6 (CLIP) ×4 IMPLANT
CONN ST 1/4X3/8  BEN (MISCELLANEOUS) ×2
CONN ST 1/4X3/8 BEN (MISCELLANEOUS) IMPLANT
CONN Y 3/8X3/8X3/8  BEN (MISCELLANEOUS)
CONN Y 3/8X3/8X3/8 BEN (MISCELLANEOUS) IMPLANT
CONT SPEC 4OZ CLIKSEAL STRL BL (MISCELLANEOUS) ×22 IMPLANT
DERMABOND ADVANCED (GAUZE/BANDAGES/DRESSINGS)
DERMABOND ADVANCED .7 DNX12 (GAUZE/BANDAGES/DRESSINGS) IMPLANT
DRAIN CHANNEL 28F RND 3/8 FF (WOUND CARE) ×2 IMPLANT
DRAIN CHANNEL 32F RND 10.7 FF (WOUND CARE) IMPLANT
DRAPE LAPAROSCOPIC ABDOMINAL (DRAPES) ×4 IMPLANT
DRAPE WARM FLUID 44X44 (DRAPE) ×4 IMPLANT
ELECT BLADE 6.5 EXT (BLADE) ×4 IMPLANT
ELECT REM PT RETURN 9FT ADLT (ELECTROSURGICAL) ×4
ELECTRODE REM PT RTRN 9FT ADLT (ELECTROSURGICAL) ×2 IMPLANT
GAUZE SPONGE 4X4 12PLY STRL (GAUZE/BANDAGES/DRESSINGS) ×4 IMPLANT
GLOVE SURG SIGNA 7.5 PF LTX (GLOVE) ×8 IMPLANT
GLOVE SURG SS PI 7.0 STRL IVOR (GLOVE) ×6 IMPLANT
GOWN STRL REUS W/ TWL LRG LVL3 (GOWN DISPOSABLE) ×4 IMPLANT
GOWN STRL REUS W/ TWL XL LVL3 (GOWN DISPOSABLE) ×4 IMPLANT
GOWN STRL REUS W/TWL LRG LVL3 (GOWN DISPOSABLE) ×8
GOWN STRL REUS W/TWL XL LVL3 (GOWN DISPOSABLE) ×8
HANDLE STAPLE ENDO GIA SHORT (STAPLE) ×2
HEMOSTAT SURGICEL 2X14 (HEMOSTASIS) ×2 IMPLANT
KIT BASIN OR (CUSTOM PROCEDURE TRAY) ×4 IMPLANT
KIT ROOM TURNOVER OR (KITS) ×4 IMPLANT
KIT SUCTION CATH 14FR (SUCTIONS) ×4 IMPLANT
LIQUID BAND (GAUZE/BANDAGES/DRESSINGS) ×2 IMPLANT
NS IRRIG 1000ML POUR BTL (IV SOLUTION) ×14 IMPLANT
PACK CHEST (CUSTOM PROCEDURE TRAY) ×4 IMPLANT
PAD ARMBOARD 7.5X6 YLW CONV (MISCELLANEOUS) ×8 IMPLANT
POUCH ENDO CATCH II 15MM (MISCELLANEOUS) ×2 IMPLANT
POUCH SPECIMEN RETRIEVAL 10MM (ENDOMECHANICALS) ×2 IMPLANT
PROBE CRYO2-ABLATION MALLABLE (MISCELLANEOUS) ×2 IMPLANT
RELOAD EGIA 45 MED/THCK PURPLE (STAPLE) ×8 IMPLANT
RELOAD EGIA 45 TAN VASC (STAPLE) ×4 IMPLANT
RELOAD EGIA 60 MED/THCK PURPLE (STAPLE) ×12 IMPLANT
RELOAD EGIA TRIS TAN 45 CVD (STAPLE) ×16 IMPLANT
RELOAD STAPLE 45 TAN MED CVD (STAPLE) IMPLANT
RELOAD STAPLE 60 BLK XTHK ART (STAPLE) IMPLANT
RELOAD STAPLE 60 MED/THCK ART (STAPLE) IMPLANT
RELOAD TRI 2.0 60 XTHK VAS SUL (STAPLE) ×4 IMPLANT
SCISSORS ENDO CVD 5DCS (MISCELLANEOUS) IMPLANT
SEALANT PROGEL (MISCELLANEOUS) IMPLANT
SEALANT SURG COSEAL 4ML (VASCULAR PRODUCTS) IMPLANT
SEALANT SURG COSEAL 8ML (VASCULAR PRODUCTS) IMPLANT
SOLUTION ANTI FOG 6CC (MISCELLANEOUS) ×4 IMPLANT
SPECIMEN JAR MEDIUM (MISCELLANEOUS) ×4 IMPLANT
SPONGE GAUZE 4X4 12PLY STER LF (GAUZE/BANDAGES/DRESSINGS) ×2 IMPLANT
SPONGE TONSIL 1.25 RF SGL STRG (GAUZE/BANDAGES/DRESSINGS) ×2 IMPLANT
STAPLER ENDO GIA 12 SHRT THIN (STAPLE) IMPLANT
STAPLER ENDO GIA 12MM SHORT (STAPLE) ×2 IMPLANT
SUT PROLENE 4 0 RB 1 (SUTURE)
SUT PROLENE 4-0 RB1 .5 CRCL 36 (SUTURE) IMPLANT
SUT SILK  1 MH (SUTURE) ×4
SUT SILK 1 MH (SUTURE) ×2 IMPLANT
SUT SILK 1 TIES 10X30 (SUTURE) ×4 IMPLANT
SUT SILK 2 0 SH (SUTURE) IMPLANT
SUT SILK 2 0SH CR/8 30 (SUTURE) IMPLANT
SUT SILK 3 0 SH 30 (SUTURE) IMPLANT
SUT SILK 3 0SH CR/8 30 (SUTURE) IMPLANT
SUT VIC AB 0 CTX 27 (SUTURE) IMPLANT
SUT VIC AB 1 CTX 27 (SUTURE) ×2 IMPLANT
SUT VIC AB 2-0 CT1 27 (SUTURE)
SUT VIC AB 2-0 CT1 TAPERPNT 27 (SUTURE) IMPLANT
SUT VIC AB 2-0 CTX 36 (SUTURE) ×4 IMPLANT
SUT VIC AB 3-0 MH 27 (SUTURE) IMPLANT
SUT VIC AB 3-0 SH 27 (SUTURE)
SUT VIC AB 3-0 SH 27X BRD (SUTURE) IMPLANT
SUT VIC AB 3-0 X1 27 (SUTURE) ×6 IMPLANT
SUT VICRYL 0 UR6 27IN ABS (SUTURE) ×4 IMPLANT
SUT VICRYL 2 TP 1 (SUTURE) IMPLANT
SWAB COLLECTION DEVICE MRSA (MISCELLANEOUS) IMPLANT
SYSTEM SAHARA CHEST DRAIN ATS (WOUND CARE) ×4 IMPLANT
TAPE CLOTH SURG 6X10 WHT LF (GAUZE/BANDAGES/DRESSINGS) ×2 IMPLANT
TIP APPLICATOR SPRAY EXTEND 16 (VASCULAR PRODUCTS) IMPLANT
TOWEL OR 17X24 6PK STRL BLUE (TOWEL DISPOSABLE) ×4 IMPLANT
TOWEL OR 17X26 10 PK STRL BLUE (TOWEL DISPOSABLE) ×8 IMPLANT
TRAP SPECIMEN MUCOUS 40CC (MISCELLANEOUS) IMPLANT
TRAY FOLEY CATH 16FRSI W/METER (SET/KITS/TRAYS/PACK) ×4 IMPLANT
TROCAR XCEL BLADELESS 5X75MML (TROCAR) ×4 IMPLANT
TROCAR XCEL NON-BLD 5MMX100MML (ENDOMECHANICALS) IMPLANT
TUBE ANAEROBIC SPECIMEN COL (MISCELLANEOUS) IMPLANT
TUNNELER SHEATH ON-Q 11GX8 DSP (PAIN MANAGEMENT) IMPLANT
WATER STERILE IRR 1000ML POUR (IV SOLUTION) ×6 IMPLANT

## 2014-08-14 NOTE — OR Nursing (Signed)
Pt dangled, tolerated well.

## 2014-08-14 NOTE — Anesthesia Procedure Notes (Addendum)
Procedure Name: Intubation Date/Time: 08/14/2014 8:14 AM Performed by: Merrilyn Puma B Pre-anesthesia Checklist: Patient identified, Timeout performed, Emergency Drugs available, Suction available and Patient being monitored Patient Re-evaluated:Patient Re-evaluated prior to inductionOxygen Delivery Method: Circle system utilized Preoxygenation: Pre-oxygenation with 100% oxygen Intubation Type: IV induction Ventilation: Mask ventilation without difficulty Laryngoscope Size: Mac and 3 Grade View: Grade I Endobronchial tube: Left, Double lumen EBT, EBT position confirmed by auscultation and EBT position confirmed by fiberoptic bronchoscope and 37 Fr Number of attempts: 1 Airway Equipment and Method: Stylet Placement Confirmation: positive ETCO2,  ETT inserted through vocal cords under direct vision,  breath sounds checked- equal and bilateral and CO2 detector Secured at: 29 cm Tube secured with: Tape Dental Injury: Teeth and Oropharynx as per pre-operative assessment    RIJ CVP Dual Lumen: 5750-5183: The patient was identified and consent obtained.  TO was performed, and full barrier precautions were used.  The skin was anesthetized with lidocaine.  Once the vein was located with the 22 ga. needle using ultrasound guidance , the wire was inserted into the vein.  The wire location was confirmed with ultrasound.  The tissue was dilated and the catheter was carefully inserted, then sutured in place. A dressing was applied. The patient tolerated the procedure well.  CXR was ordered for PACU.

## 2014-08-14 NOTE — Interval H&P Note (Signed)
History and Physical Interval Note:  08/14/2014 8:00 AM  Sharon [name]  has presented today for surgery, with the diagnosis of Left Lower Lobe Lung Nodule  The various methods of treatment have been discussed with the patient and family. After consideration of risks, benefits and other options for treatment, the patient has consented to  Procedure(s): VIDEO ASSISTED THORACOSCOPY (VATS)/WEDGE RESECTION (Left) POSSIBLE LOWER LOBECTOMY (Left) CRYO INTERCOSTAL NERVE BLOCK (Left) as a surgical intervention .  The patient's history has been reviewed, patient examined, no change in status, stable for surgery.  I have reviewed the patient's chart and labs.  Questions were answered to the patient's satisfaction.     Melrose Nakayama

## 2014-08-14 NOTE — Anesthesia Postprocedure Evaluation (Signed)
  Anesthesia Post-op Note  Patient: Sharon [name]  Procedure(s) Performed: Procedure(s): VIDEO ASSISTED THORACOSCOPY (VATS)/WEDGE RESECTION (Left) LEFT LOWER LOBECTOMY (Left) CRYO INTERCOSTAL NERVE BLOCK (Left) NODE DISSECTION, LEFT LOWER LOBE LUNG (Left)  Patient Location: PACU  Anesthesia Type:General  Level of Consciousness: awake  Airway and Oxygen Therapy: Patient Spontanous Breathing  Post-op Pain: mild  Post-op Assessment: Post-op Vital signs reviewed  Post-op Vital Signs: Reviewed  Last Vitals:  Filed Vitals:   08/14/14 1200  BP: 99/85  Pulse: 55  Temp: 36.4 C  Resp: 18    Complications: No apparent anesthesia complications

## 2014-08-14 NOTE — Transfer of Care (Signed)
Immediate Anesthesia Transfer of Care Note  Patient: Sharon [name]  Procedure(s) Performed: Procedure(s): VIDEO ASSISTED THORACOSCOPY (VATS)/WEDGE RESECTION (Left) LEFT LOWER LOBECTOMY (Left) CRYO INTERCOSTAL NERVE BLOCK (Left) NODE DISSECTION, LEFT LOWER LOBE LUNG (Left)  Patient Location: PACU  Anesthesia Type:General  Level of Consciousness: awake, alert  and oriented  Airway & Oxygen Therapy: Patient Spontanous Breathing and Patient connected to face mask oxygen  Post-op Assessment: Report given to RN, Post -op Vital signs reviewed and stable and Patient moving all extremities X 4  Post vital signs: Reviewed and stable  Last Vitals:  Filed Vitals:   08/14/14 0555  BP: 135/60  Pulse: 63  Temp: 36.7 C  Resp: 20    Complications: No apparent anesthesia complications

## 2014-08-14 NOTE — Anesthesia Preprocedure Evaluation (Addendum)
Anesthesia Evaluation  Patient identified by MRN, date of birth, ID band Patient awake    Reviewed: Allergy & Precautions, NPO status , Patient's Chart, lab work & pertinent test results  Airway Mallampati: II   Neck ROM: Full    Dental  (+) Dental Advisory Given   Pulmonary shortness of breath and with exertion, pneumonia -, COPDformer smoker,  breath sounds clear to auscultation        Cardiovascular + CAD, + Past MI, + Cardiac Stents (2009) and + Peripheral Vascular Disease Rhythm:Regular  01/07/08 Echo: Normal LV systolic function. Anterior wall akinesis has improved. LVEF 55-60%. Impaired LV relaxation. Calcified AV, mild AS, moderate AI. Trace MR/TR   Neuro/Psych    GI/Hepatic negative GI ROS, Neg liver ROS,   Endo/Other    Renal/GU negative Renal ROS     Musculoskeletal  (+) Arthritis -,   Abdominal   Peds  Hematology   Anesthesia Other Findings   Reproductive/Obstetrics                            Anesthesia Physical Anesthesia Plan  ASA: III  Anesthesia Plan: General   Post-op Pain Management:    Induction: Intravenous  Airway Management Planned: Double Lumen EBT  Additional Equipment: CVP and Arterial line  Intra-op Plan:   Post-operative Plan: Extubation in OR  Informed Consent: I have reviewed the patients History and Physical, chart, labs and discussed the procedure including the risks, benefits and alternatives for the proposed anesthesia with the patient or authorized representative who has indicated his/her understanding and acceptance.   Dental advisory given  Plan Discussed with: Anesthesiologist and Surgeon  Anesthesia Plan Comments:         Anesthesia Quick Evaluation

## 2014-08-14 NOTE — Brief Op Note (Addendum)
08/14/2014  11:53 AM  PATIENT:  Sharon [name]  54 y.o. female  PRE-OPERATIVE DIAGNOSIS:  Left Lower Lobe Lung Nodule  POST-OPERATIVE DIAGNOSIS:  Non-small Cell Carcinoma Left Lower Lobe, Clinical stage IA  PROCEDURE:   LEFT VATS WEDGE RESECTION LEFT LOWER LOBE THORACOSCOPIC LEFT LOWER LOBECTOMY MEDIASTINAL LYMPH NODE DISSECTION CRYOANALGESIA OF INTERCOSTAL NERVES  SURGEON:  Surgeon(s) and Role:    * Melrose Nakayama, MD - Primary  ASSISTANT: Carlyn Reichert, RNFA  ANESTHESIA:   general  EBL:  Total I/O In: 1500 [I.V.:1500] Out: 625 [Urine:475; Blood:150]  BLOOD ADMINISTERED:none  DRAINS: 2 Chest Tube(s) in the LEFT   LOCAL MEDICATIONS USED:  NONE  SPECIMEN:  Source of Specimen:  LLL wedge, Left lower lobe, lymph nodes  DISPOSITION OF SPECIMEN:  PATHOLOGY  COUNTS:  YES  PLAN OF CARE: Admit to inpatient   PATIENT DISPOSITION:  PACU - hemodynamically stable.   Delay start of Pharmacological VTE agent (>24hrs) due to surgical blood loss or risk of bleeding: yes  FINDINGS- 1 cm left lower lobe nodule- Frozen showed non-small cell carcinoma

## 2014-08-14 NOTE — H&P (View-Only) (Signed)
[address]       [place] [address]             [phone]       PCP is No primary care provider on file. Referring Provider is Gardiner Rhyme, [facility]: [place]  Chief Complaint  Patient presents with  . Lung Lesion    Surgical eval, PET Scan 07/22/14, Chest CT 07/03/14     HPI: 54 yo woman with a history of tobacco abuse, CAD with prior LAD stent and aortic insufficiency. She has smoked 1- 1.5 ppd since age 64. She was ill in January with fever, congestion and other symptoms suggestive of a respiratory infection. She was treated with antibiotics and a chest x-ray was obtained. It showed a 12 mm nodule in the left lung. A CT showed an 11 mm spiculated nodule in the left lower lobe adjacent to the fissure. She had a PET on 07/18/14 which showed the nodule was hypermetabolic with an SUV of 0.10. There was no evidence of regional or distant metastases.  She gets SOB with exertion- walking a mile or two flights of stairs. She has a chronic cough. She denies hemoptysis. She has lost 8 pounds over the past 3 months due to a poor appetite. She has right sided CW pain due to a rib fracture. She is down to about 1/2 ppd smoking. She does have "tension" headaches, but these have been going on for years.   Zubrod Score: At the time of surgery this patient's most appropriate activity status/level should be described as: []     0    Normal activity, no symptoms [x]     1    Restricted in physical strenuous activity but ambulatory, able to do out light work []     2    Ambulatory and capable of self care, unable to do work activities, up and about >50 % of waking hours                              []     3    Only limited self care, in bed greater than 50% of waking hours []     4    Completely disabled, no self care, confined to bed or chair []     5    Moribund   Past Medical History  Diagnosis Date  . Ischemic cardiomyopathy   . Hyperlipidemia   . Tobacco  user   . Coronary artery disease   . Occlusion of right coronary artery   . Aortic insufficiency     moderate by echo in 2009  . Acute ST segment elevation MI   . 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 S/P coronary artery stent placement     2009    Past Surgical History  Procedure Laterality Date  . Cholecystectomy    . Tubal ligation    . Ectopic pregnancies requiring laparotomies    . Cardiac catheterization  10/15/2007    EF 30-40%  . Coronary angioplasty with stent placement      LAD  . US echocardiography  01/07/2008    EF 55-60%  . Transthoracic echocardiogram  10/17/2007  . Cesarean section      Family History  Problem Relation Age of Onset  . Diabetes Mother   . Hypertension  Mother   . Cancer Father     pancreatic and esophageal  . Obesity Sister   . Cancer Sister     breast    Social History History  Substance Use Topics  . Smoking status: Current Every Day Smoker -- 1.00 packs/day for 36 years    Types: Cigarettes  . Smokeless tobacco: Not on file  . Alcohol Use: Yes     Comment: occasional    Current Outpatient Prescriptions  Medication Sig Dispense Refill  . alendronate (FOSAMAX) 70 MG tablet Take 70 mg by mouth once a week.  0  . aspirin (ASPIR-LOW) 81 MG EC tablet Take 81 mg by mouth daily. On hold    . buPROPion (WELLBUTRIN SR) 150 MG 12 hr tablet Take 150 mg by mouth 2 (two) times daily.   0  . Cholecalciferol (VITAMIN D-3) 1000 UNITS CAPS Take 1 capsule by mouth daily.    . clopidogrel (PLAVIX) 75 MG tablet Take 1 tablet (75 mg total) by mouth daily. 90 tablet 3  . Cyanocobalamin (VITAMIN B 12 PO) Take by mouth daily.    . nicotine (NICODERM CQ - DOSED IN MG/24 HOURS) 21 mg/24hr patch Place 21 mg onto the skin daily.    . rosuvastatin (CRESTOR) 10 MG tablet Take 1 tablet (10 mg total) by mouth daily. 90 tablet 3  . vitamin E 400 UNIT capsule Take 400 Units by mouth daily.       No current facility-administered medications for this visit.    Allergies  Allergen Reactions  . Codeine Shortness Of Breath, Nausea Only and Swelling    Tongue swells  . Lipitor [Atorvastatin Calcium] Other (See Comments)    Muscle ache    Review of Systems  Constitutional: Positive for appetite change, fatigue and unexpected weight change (has lost 8 pounds in 3 months, 13 pounds in 6 months). Negative for fever.  Respiratory: Positive for cough (productive, no hemoptysis).   Cardiovascular: Positive for palpitations and leg swelling. Negative for chest pain.       Varicose veins and poor circulation in legs. Carotid stensosis- asymptomatic  Gastrointestinal: Negative.   Musculoskeletal: Positive for back pain and arthralgias.       Pain in leg with walking- "in the bones"- better after starting fosamax  Neurological: Positive for headaches ("tension").  Hematological: Bruises/bleeds easily (plavix).  Psychiatric/Behavioral: Positive for dysphoric mood. The patient is nervous/anxious.   All other systems reviewed and are negative.   BP 123/72 mmHg  Pulse 80  Resp 20  Ht 5\' 4"  (1.626 m)  Wt 139 lb (63.05 kg)  BMI 23.85 kg/m2  SpO2 94% Physical Exam  Constitutional: She is oriented to person, place, and time. She appears well-developed and well-nourished. No distress.  HENT:  Head: Normocephalic and atraumatic.  Eyes: EOM are normal. Pupils are equal, round, and reactive to light.  Neck: No thyromegaly present.  Bilateral carotid bruits  Cardiovascular: Normal rate, regular rhythm and intact distal pulses.   Murmur (systolic and diastolic components at RUSB) heard. Pulmonary/Chest: Effort normal and breath sounds normal. She has no wheezes. She has no rales.  Abdominal: Soft. There is no tenderness.  Musculoskeletal: She exhibits no edema.  Lymphadenopathy:    She has no cervical adenopathy.  Neurological: She is alert and oriented to person, place, and time. No  cranial nerve deficit.  Skin: Skin is warm and dry.  Psychiatric:  Anxious/ tearful  Vitals reviewed.    Diagnostic Tests: I personally reviewed the CXR, chest  CT and PET films. There is a spiculated nodule in the left lower lobe ~ 11 mm in diameter. It is hypermetabolic.   FVC= 3.16(82%) FEV1= 2.45(82%) No significant improvement with bronchodilators  Impression: 54 yo smoker with a new 11 mm left lower lobe nodule. This is highly suspicious for a primary bronchogenic carcinoma and has to be considered that unless it can be proven otherwise.  I reviewed the scans with Mrs. Kilgore and her daughter. We discussed the differential as well as diagnostic and treatment algorithms. We discussed bronchoscopy, CT guided biopsy and surgical biopsy. The only one of those I would trust is an excisional biopsy.  I recommended that we proceed with a left VATS, wedge resection and possible left lower lobectomy for definitive diagnosis and treatment.  I have discussed the general nature of the procedure, the need for general anesthesia, and the incisions to be used with Mrs. Palla and her daughter. I discussed the expected hospital stay, overall recovery and short and long term outcomes. I reviewed the indications, risks, benefits adn alternatives. They understand the risks include but are not limited to death, stroke, MI, DVT/PE, bleeding, possible need for transfusion, infections, prolonged air leaks, cardiac arrhythmias, as well as the possibility of unforeseeable complications.  She accept the risks and agrees to proceed.  We also discussed the use of cryo-analgesia and it's risks/ benefits. She wishes to have that done at the time of surgery.  She will hold her plavix for 4 days prior to surgery as approved by Dr. Acie Fredrickson.  Plan: Hold Plavix after dose this Saturday  Left VATS, wedge resection, possible left lower lobectomy, cryoanalgesia of intercostal nerves on Thursday 08/14/14  Melrose Nakayama, MD [facility] [phone]

## 2014-08-15 ENCOUNTER — Encounter (HOSPITAL_COMMUNITY): Payer: Self-pay | Admitting: Thoracic Surgery (Cardiothoracic Vascular Surgery)

## 2014-08-15 ENCOUNTER — Inpatient Hospital Stay (HOSPITAL_COMMUNITY): Payer: 59

## 2014-08-15 LAB — BLOOD GAS, ARTERIAL
Acid-base deficit: 1.7 mmol/L (ref 0.0–2.0)
BICARBONATE: 23 meq/L (ref 20.0–24.0)
Drawn by: 39898
O2 Content: 3 L/min
O2 Saturation: 95.8 %
PH ART: 7.357 (ref 7.350–7.450)
Patient temperature: 98.6
TCO2: 24.3 mmol/L (ref 0–100)
pCO2 arterial: 42.1 mmHg (ref 35.0–45.0)
pO2, Arterial: 81.1 mmHg (ref 80.0–100.0)

## 2014-08-15 LAB — BASIC METABOLIC PANEL
Anion gap: 3 — ABNORMAL LOW (ref 5–15)
BUN: 10 mg/dL (ref 6–23)
CO2: 27 mmol/L (ref 19–32)
Calcium: 8 mg/dL — ABNORMAL LOW (ref 8.4–10.5)
Chloride: 111 mmol/L (ref 96–112)
Creatinine, Ser: 0.66 mg/dL (ref 0.50–1.10)
GFR calc Af Amer: >90 mL/min (ref >90)
GFR calc non Af Amer: >90 mL/min (ref >90)
GLUCOSE: 103 mg/dL — AB (ref 70–99)
POTASSIUM: 4.5 mmol/L (ref 3.5–5.1)
Sodium: 141 mmol/L (ref 135–145)

## 2014-08-15 LAB — GLUCOSE, CAPILLARY
Glucose-Capillary: 97 mg/dL (ref 70–99)
Glucose-Capillary: 91 mg/dL (ref 70–99)
Glucose-Capillary: 82 mg/dL (ref 70–99)
Glucose-Capillary: 105 mg/dL — ABNORMAL HIGH (ref 70–99)

## 2014-08-15 LAB — CBC
HCT: 37.8 % (ref 36.0–46.0)
HEMOGLOBIN: 12.4 g/dL (ref 12.0–15.0)
MCH: 33.1 pg (ref 26.0–34.0)
MCHC: 32.8 g/dL (ref 30.0–36.0)
MCV: 100.8 fL — ABNORMAL HIGH (ref 78.0–100.0)
Platelets: 155 10*3/uL (ref 150–400)
RBC: 3.75 MIL/uL — ABNORMAL LOW (ref 3.87–5.11)
RDW: 13.8 % (ref 11.5–15.5)
WBC: 12.1 10*3/uL — AB (ref 4.0–10.5)

## 2014-08-15 MED ORDER — SODIUM CHLORIDE 0.9 % IJ SOLN: 9.0000 mL | INTRAMUSCULAR | Status: DC | PRN

## 2014-08-15 MED ORDER — DIPHENHYDRAMINE HCL 50 MG/ML IJ SOLN: 12.5000 mg | Freq: Four times a day (QID) | INTRAMUSCULAR | Status: DC | PRN

## 2014-08-15 MED ORDER — METOCLOPRAMIDE HCL 5 MG/ML IJ SOLN
10.0000 mg | Freq: Four times a day (QID) | INTRAMUSCULAR | Status: AC
  Filled 2014-08-15 (×4): qty 2

## 2014-08-15 MED ORDER — DIPHENHYDRAMINE HCL 12.5 MG/5ML PO ELIX
12.5000 mg | ORAL_SOLUTION | Freq: Four times a day (QID) | ORAL | Status: DC | PRN
  Filled 2014-08-15: qty 5

## 2014-08-15 MED ORDER — FENTANYL 10 MCG/ML IV SOLN
INTRAVENOUS | Status: AC
  Administered 2014-08-15: 15 ug via INTRAVENOUS
  Administered 2014-08-15: 20:00:00 via INTRAVENOUS
  Administered 2014-08-16 (×3): 15 ug via INTRAVENOUS
  Administered 2014-08-16: 45 ug via INTRAVENOUS
  Administered 2014-08-16 (×2): 30 ug via INTRAVENOUS
  Administered 2014-08-16: 45 ug via INTRAVENOUS
  Administered 2014-08-17 (×2): 30 ug via INTRAVENOUS
  Administered 2014-08-17: 45 ug via INTRAVENOUS
  Administered 2014-08-18: 15 ug via INTRAVENOUS
  Administered 2014-08-18: 60 ug via INTRAVENOUS
  Filled 2014-08-15: qty 50

## 2014-08-15 MED ORDER — ONDANSETRON HCL 4 MG/2ML IJ SOLN
4.0000 mg | Freq: Four times a day (QID) | INTRAMUSCULAR | Status: DC | PRN
Start: 2014-08-15 — End: 2014-08-18

## 2014-08-15 MED ORDER — NALOXONE HCL 0.4 MG/ML IJ SOLN: 0.4000 mg | INTRAMUSCULAR | Status: DC | PRN

## 2014-08-15 NOTE — Progress Notes (Signed)
Utilization review completed.  

## 2014-08-15 NOTE — Op Note (Signed)
NAMEMESCAL, FLINCHBAUGH NO.:  [id number]  MEDICAL RECORD NO.:  [id number]  LOCATION:  3S05C                        FACILITY:  [facility]  PHYSICIAN:  Revonda Standard. Roxan Hockey, M.D.DATE OF BIRTH:  1960/09/13  DATE OF PROCEDURE:  08/14/2014 DATE OF DISCHARGE:                              OPERATIVE REPORT   PREOPERATIVE DIAGNOSIS:  Left lower lobe lung nodule.  POSTOPERATIVE DIAGNOSIS:  Poorly-differentiated carcinoma, left lower lobe.  Clinical stage IA.  PROCEDURE:   Left video-assisted thoracoscopy Wedge resection left lower lobe Thoracoscopic left lower lobectomy Mediastinal lymph node dissection Cryoanalgesia of intercostal nerves.  SURGEON:  Revonda Standard. Roxan Hockey, M.D.  ASSISTANT:  Carlyn Reichert, RNFA.  ANESTHESIA:  General.  FINDINGS:  A 1 cm mass in the mid portion of left lower lobe along the major fissure.  Did not cross the fissure, but did involve the visceral pleura.  Wedge resection showed poorly-differentiated non-small cell carcinoma.  Multiple enlarged, but otherwise benign-appearing, lymph nodes.  CLINICAL NOTE:  Ms. Hild is a 54 year old woman with a history of tobacco abuse who recently was suffering from a respiratory infection. A chest x-ray was obtained that showed a nodule in the left lung. CT confirmed an 11 mm spiculated nodule in the left lower lobe adjacent to the major fissure.  A PET/CT showed the lesion was hypermetabolic with an SUV of 3.97.  There was no evidence of regional or distant metastases.  The patient was advised to undergo surgical resection and definitive surgical treatment, if it was in fact a lung cancer.  The indications, risks, benefits, and alternatives were discussed in detail with the patient.  She understood and accepted the risks and agreed to proceed.  OPERATIVE NOTE:  Ms. Apsey was brought to the preoperative holding area on August 14, 2014. Anesthesia placed a central line and an arterial blood  pressure monitoring line.  She was taken to the operating room, anesthetized, and intubated.  Intravenous antibiotics were administered.  Sequential compressive devices were placed on the calves for DVT prophylaxis and a Foley catheter was placed.  She was placed in a right lateral decubitus position, and the left chest was prepped and draped in usual sterile fashion.  Selective ventilation of the right lung was initiated and was tolerated well throughout the procedure.  An incision was made in the midaxillary line in the seventh intercostal space.  A 5-mm port was inserted and a thoracoscope was advanced into the chest.  There was good isolation of the left lung, although it was relatively slow to deflate initially.  A small working incision was made in the fourth interspace anterolaterally.  No rib spreading was performed during the procedure.  A second small port incision was made anterior to the first for instrumentation.  After applying suction to the left lung, it did deflate. There was a visible abnormality on the visceral pleura on the left lower lobe along the minor fissure.  The fissure was complete.  The nodule was palpated and then a wedge resection was performed using sequential firings of endoscopic GIA stapler with purple cartridges.  The specimen was placed into an endoscopic retrieval bag, removed, and sent to Pathology for frozen section.  While awaiting the results of the frozen section to return, cryoanalgesia was performed of the intercostal nerves beginning at the third interspace and freezing each level down to the 8th interspace.  A 2 cm segment of the cryoprobe was placed over the nerve and taken to -70 degrees Celsius for 2 minutes at each level.  The frozen section subsequently returned showing poorly-differentiated non-small cell carcinoma.  As discussed with the patient preoperatively, we decided to proceed with the left lower lobectomy for  definitive surgical treatment for clinical stage IA disease.  The inferior ligament was divided with electrocautery.  No significant lymph node was encountered.  The pleural reflection then was divided anteriorly and posteriorly at the hilum.  There was a large node adjacent to the inferior pulmonary vein.  This node was taken. All lymph nodes encountered during the dissection were removed and sent as separate specimens for permanent pathology.  The inferior vein was encircled and divided with an endoscopic vascular stapler.  The pleura overlying the PA in the fissure was divided with electrocautery. There was a large node between the basilar pulmonary artery branches and the left lower lobe bronchus at the bifurcation of the left mainstem bronchus into the upper and lower lobe bronchi, this node was dissected out.  It was approximately 1.5 cm in diameter, but otherwise appeared benign.  There was no invasion of surrounding structures.  After doing so, the basilar segmental branches of the artery were easily visible.  The dissection was then continued into the fissure out laterally.  In retracting the upper lobe to expose the final portion of the fissure between the superior segment and the upper lobe, there was an adhesion to an area of multiple small blebs in the upper lobe that was divided.  The fissure was completed posteriorly with endoscopic stapler.  The superior segmental PA branch now was visible. It was dissected out, encircled, and then divided with an endoscopic vascular stapler.  Next, the basilar segmental branches were divided at their common trunk with an endoscopic stapler.  There was good hemostasis at the staple lines.  Another enlarged node was removed and the bronchus was clearly visible.  A 45 mm Endo-GIA stapler with a purple cartridge was placed across the bronchus and closed, but not fired.  A test inflation showed good aeration of the upper lobe.  The stapler then was  fired transecting the lower lobe bronchus.  The lower lobe was placed into an endoscopic retrieval bag, removed, and sent for permanent pathology.  The pleura overlying the aortopulmonary window was incised. There was a large, but otherwise benign-appearing lymph node in this area.  This node was taken. Finally, the upper lobe was retracted Posteriorly. The subcarinal space was explored, and again there was an enlarged, but relatively benign-appearing lymph node in this area as well.  Hemostasis was achieved with electrocautery.  In viewing the upper lobe after it had been inflated, the area of blebs at the apex was more prominent than previously appreciated.  This was removed with sequential firings of an endoscopic GIA stapler to prevent later pneumothorax.  The chest was copiously irrigated with warm saline.  A test inflation to 30 cm of water showed no air leakage.  A 28-French Blake drain was placed through the original port incision and directed posteriorly.  A 28-French chest tube was placed through the second port incision and directed anteriorly.  Both were secured with #1 silk sutures.  The left upper lobe was reinflated.  The  working incision was closed with a running #1 Vicryl fascial suture.  The subcutaneous tissue and skin were closed in standard fashion.  The chest tubes were placed to suction.  The patient was placed back in a supine position.  She was extubated in the operating room and taken to the postanesthetic care unit in good condition.  All sponge, needle, and instrument counts were correct at the end of the procedure.     Revonda Standard Roxan Hockey, M.D.    SCH/MEDQ  D:  08/14/2014  T:  08/15/2014  Job:  [id number]

## 2014-08-15 NOTE — Progress Notes (Addendum)
[address]       [place] [address]             [phone]          1 Day Post-Op Procedure(s) (LRB): VIDEO ASSISTED THORACOSCOPY (VATS)/WEDGE RESECTION (Left) LEFT LOWER LOBECTOMY (Left) CRYO INTERCOSTAL NERVE BLOCK (Left) NODE DISSECTION, LEFT LOWER LOBE LUNG (Left)  Subjective: Complaining of nausea related to Dilaudid.  Not using PCA now and hurting quite a bit.   Objective: Vital signs in last 24 hours: Patient Vitals for the past 24 hrs:  BP Temp Temp src Pulse Resp SpO2 Height Weight  08/15/14 0801 109/62 mmHg 97.2 F (36.2 C) Oral 72 17 98 % - -  08/15/14 0800 - 97.2 F (36.2 C) Oral 74 13 94 % - -  08/15/14 0358 (!) 111/56 mmHg 97.5 F (36.4 C) Oral 81 13 93 % - -  08/14/14 2322 (!) 113/56 mmHg 97.7 F (36.5 C) Oral 81 (!) 25 95 % - -  08/14/14 2200 (!) 94/52 mmHg - - 64 11 97 % - -  08/14/14 2000 (!) 112/59 mmHg - - 73 14 96 % - -  08/14/14 1944 104/67 mmHg 97.5 F (36.4 C) Oral 76 11 96 % - -  08/14/14 1823 - - - - 11 - - -  08/14/14 1800 137/64 mmHg - - 79 10 95 % - -  08/14/14 1615 - - - 71 (!) 8 96 % - -  08/14/14 1600 - 97.6 F (36.4 C) Oral 76 16 96 % 5\' 4"  (1.626 m) 145 lb 4.5 oz (65.9 kg)  08/14/14 1545 - - - 70 (!) 4 95 % - -  08/14/14 1530 - - - 72 17 94 % - -  08/14/14 1515 - - - 83 (!) 22 93 % - -  08/14/14 1500 - - - 69 13 94 % - -  08/14/14 1445 - - - 69 16 92 % - -  08/14/14 1430 - - - 66 10 90 % - -  08/14/14 1415 - - - 80 19 92 % - -  08/14/14 1400 - - - 64 13 93 % - -  08/14/14 1345 - - - 74 13 94 % - -  08/14/14 1330 - - - 66 15 92 % - -  08/14/14 1315 - - - 66 15 90 % - -  08/14/14 1300 - - - 67 16 93 % - -  08/14/14 1245 - - - 62 12 92 % - -  08/14/14 1230 - - - 72 (!) 21 (!) 89 % - -  08/14/14 1215 - - - 68 (!) 24 93 % - -  08/14/14 1200 99/85 mmHg 97.5 F (36.4 C) - (!) 55 18 100 % - -   Current Weight  08/14/14 145 lb 4.5 oz (65.9 kg)     Intake/Output from previous day: 03/17 0701 - 03/18  0700 In: 2826.7 [I.V.:2776.7; IV Piggyback:50] Out: 6222 [Urine:1185; Emesis/NG output:20; Blood:150; Chest Tube:440]    PHYSICAL EXAM:  Heart: RRR Lungs: Decreased BS bilaterally Wound: Dressed and dry Chest tube: Intermittent 1/7 air leak with cough    Lab Results: CBC: Recent Labs  08/12/14 1248 08/15/14 0420  WBC 6.4 12.1*  HGB 15.4* 12.4  HCT 45.5 37.8  PLT 182 155   BMET:  Recent Labs  08/12/14 1248 08/15/14 0420  NA 137 141  K 4.3 4.5  CL 107 111  CO2  21 27  GLUCOSE 103* 103*  BUN 13 10  CREATININE 0.72 0.66  CALCIUM 9.4 8.0*    PT/INR:  Recent Labs  08/12/14 1248  LABPROT 13.1  INR 0.99   CXR: FINDINGS: Left chest tubes remain unchanged. There is a trace apical pneumothorax on the left, unchanged. Right jugular central line extends into the SVC. Mild linear basilar opacities persist bilaterally without significant interval change.  IMPRESSION: Unchanged trace left pneumothorax. Unchanged left chest tubes. Unchanged mild linear basilar opacities.   Assessment/Plan: S/P Procedure(s) (LRB): VIDEO ASSISTED THORACOSCOPY (VATS)/WEDGE RESECTION (Left) LEFT LOWER LOBECTOMY (Left) CRYO INTERCOSTAL NERVE BLOCK (Left) NODE DISSECTION, LEFT LOWER LOBE LUNG (Left) Pain control seems to be the main issue. Dilaudid makes her nauseated, so will change PCA to Fentanyl and watch.  CT with small intermittent air leak. Will leave CT to suction for now.  CXR stable. Mobilize, continue pulm toilet, d/c a-line.   LOS: 1 day    COLLINS,GINA H 08/15/2014  Patient seen and examined.  She is doing better pain wise currently than she was before  She has a tiny air leak- I think we can try her on water seal, hopefully can start getting tubes out tomorrow.  Resume plavix  Revonda Standard. Roxan Hockey, MD [facility] [phone]

## 2014-08-15 NOTE — Progress Notes (Addendum)
0.21 mg of dilaudid PCA wasted WIS with [employer], Therapist, sports.

## 2014-08-16 ENCOUNTER — Inpatient Hospital Stay (HOSPITAL_COMMUNITY): Payer: 59

## 2014-08-16 LAB — CBC
HCT: 35.1 % — ABNORMAL LOW (ref 36.0–46.0)
Hemoglobin: 11.3 g/dL — ABNORMAL LOW (ref 12.0–15.0)
MCH: 32.3 pg (ref 26.0–34.0)
MCHC: 32.2 g/dL (ref 30.0–36.0)
MCV: 100.3 fL — ABNORMAL HIGH (ref 78.0–100.0)
Platelets: 132 10*3/uL — ABNORMAL LOW (ref 150–400)
RBC: 3.5 MIL/uL — ABNORMAL LOW (ref 3.87–5.11)
RDW: 13.9 % (ref 11.5–15.5)
WBC: 7.3 10*3/uL (ref 4.0–10.5)

## 2014-08-16 LAB — GLUCOSE, CAPILLARY: Glucose-Capillary: 91 mg/dL (ref 70–99)

## 2014-08-16 LAB — COMPREHENSIVE METABOLIC PANEL
ALT: 20 U/L (ref 0–35)
AST: 27 U/L (ref 0–37)
Albumin: 2.7 g/dL — ABNORMAL LOW (ref 3.5–5.2)
Alkaline Phosphatase: 53 U/L (ref 39–117)
Anion gap: 4 — ABNORMAL LOW (ref 5–15)
BUN: 8 mg/dL (ref 6–23)
CO2: 25 mmol/L (ref 19–32)
CREATININE: 0.59 mg/dL (ref 0.50–1.10)
Calcium: 8.1 mg/dL — ABNORMAL LOW (ref 8.4–10.5)
Chloride: 109 mmol/L (ref 96–112)
Glucose, Bld: 92 mg/dL (ref 70–99)
Potassium: 3.9 mmol/L (ref 3.5–5.1)
Sodium: 138 mmol/L (ref 135–145)
TOTAL PROTEIN: 5.1 g/dL — AB (ref 6.0–8.3)
Total Bilirubin: 0.7 mg/dL (ref 0.3–1.2)

## 2014-08-16 NOTE — Progress Notes (Addendum)
      [address]       [place] [address]             [phone]       2 Days Post-Op Procedure(s) (LRB): VIDEO ASSISTED THORACOSCOPY (VATS)/WEDGE RESECTION (Left) LEFT LOWER LOBECTOMY (Left) CRYO INTERCOSTAL NERVE BLOCK (Left) NODE DISSECTION, LEFT LOWER LOBE LUNG (Left)  Subjective: Patient no longer has nausea since Dilaudid was switched to Fentanyl  Objective: Vital signs in last 24 hours: Temp:  [97.4 F (36.3 C)-98.5 F (36.9 C)] 98 F (36.7 C) (03/19 0900) Pulse Rate:  [63-89] 63 (03/19 0800) Cardiac Rhythm:  [-] Normal sinus rhythm (03/19 0800) Resp:  [15-23] 18 (03/19 0822) BP: (106-133)/(56-83) 133/63 mmHg (03/19 0800) SpO2:  [90 %-94 %] 92 % (03/19 0822)     Intake/Output from previous day: 03/18 0701 - 03/19 0700 In: 3377.1 [P.O.:1440; I.V.:1887.1; IV Piggyback:50] Out: 1900 [Urine:1200; Chest Tube:700]   Physical Exam:  Cardiovascular: RRR Pulmonary: Coarse on left and clear on right; no rales, wheezes, or rhonchi. Abdomen: Soft, non tender, bowel sounds present. Extremities: Mild bilateral lower extremity edema. Wounds: Clean and dry.  No erythema or signs of infection. Chest Tubes:to water seal and NO air leak  Lab Results: CBC: Recent Labs  08/15/14 0420 08/16/14 0520  WBC 12.1* 7.3  HGB 12.4 11.3*  HCT 37.8 35.1*  PLT 155 132*   BMET:  Recent Labs  08/15/14 0420 08/16/14 0520  NA 141 138  K 4.5 3.9  CL 111 109  CO2 27 25  GLUCOSE 103* 92  BUN 10 8  CREATININE 0.66 0.59  CALCIUM 8.0* 8.1*    PT/INR: No results for input(s): LABPROT, INR in the last 72 hours. ABG:  INR: Will add last result for INR, ABG once components are confirmed Will add last 4 CBG results once components are confirmed  Assessment/Plan:  1. CV - SR in the 60's. 2.  Pulmonary - Chest tubes with 700 cc last 24 ours. Chest tubes to remain for now. Chest tubes are to water seal and there is no air leak. CXR this am shows low lung  volumes,small left apical pneumothorax, bibasilar atelectasis. Encourage incentive spirometer. 3. Anemia- H and H 11.3 and 35.1 4. Rash under central line dressing likely from adhesive.  Patient doesn't want anything for it. Monitor.   ZIMMERMAN,DONIELLE MPA-C 08/16/2014,10:27 AM  Chart reviewed, patient examined, agree with above.

## 2014-08-17 ENCOUNTER — Inpatient Hospital Stay (HOSPITAL_COMMUNITY): Payer: 59

## 2014-08-17 NOTE — Progress Notes (Addendum)
      [address]       [place], [address]             [phone]       3 Days Post-Op Procedure(s) (LRB): VIDEO ASSISTED THORACOSCOPY (VATS)/WEDGE RESECTION (Left) LEFT LOWER LOBECTOMY (Left) CRYO INTERCOSTAL NERVE BLOCK (Left) NODE DISSECTION, LEFT LOWER LOBE LUNG (Left)  Subjective: Patient without complaints this am  Objective: Vital signs in last 24 hours: Temp:  [97.3 F (36.3 C)-98.4 F (36.9 C)] 97.4 F (36.3 C) (03/20 0752) Pulse Rate:  [62-71] 68 (03/20 0740) Cardiac Rhythm:  [-] Normal sinus rhythm (03/20 0756) Resp:  [16-22] 18 (03/20 0747) BP: (133-143)/(59-83) 134/69 mmHg (03/20 0740) SpO2:  [90 %-99 %] 96 % (03/20 0747)     Intake/Output from previous day: 03/19 0701 - 03/20 0700 In: 795 [I.V.:795] Out: 1451 [Urine:1200; Stool:1; Chest Tube:250]   Physical Exam:  Cardiovascular: RRR Pulmonary: Coarse on left and clear on right; no rales, wheezes, or rhonchi. Abdomen: Soft, non tender, bowel sounds present. Extremities: Mild bilateral lower extremity edema. Wounds: Clean and dry.  No erythema or signs of infection. Chest Tubes:to water seal and NO air leak  Lab Results: CBC:  Recent Labs  08/15/14 0420 08/16/14 0520  WBC 12.1* 7.3  HGB 12.4 11.3*  HCT 37.8 35.1*  PLT 155 132*   BMET:   Recent Labs  08/15/14 0420 08/16/14 0520  NA 141 138  K 4.5 3.9  CL 111 109  CO2 27 25  GLUCOSE 103* 92  BUN 10 8  CREATININE 0.66 0.59  CALCIUM 8.0* 8.1*    PT/INR: No results for input(s): LABPROT, INR in the last 72 hours. ABG:  INR: Will add last result for INR, ABG once components are confirmed Will add last 4 CBG results once components are confirmed  Assessment/Plan:  1. CV - SR in the 60's. 2.  Pulmonary - Chest tubes with 230 cc last 24 ours. Possibly remove one chest tube. Chest tubes are to water seal and there is no air leak. Remove anterior chest tube.CXR this am shows low lung volumes,persistent small left  apical pneumothorax, bibasilar atelectasis. Encourage incentive spirometer. 3. Anemia- H and H 11.3 and 35.1 4. Rash under central line dressing likely from adhesive.  Patient doesn't want anything for it. Monitor.   ZIMMERMAN,DONIELLE MPA-C 08/17/2014,9:51 AM   Chart reviewed, patient examined, agree with above. Remove one chest tube today.

## 2014-08-18 ENCOUNTER — Inpatient Hospital Stay (HOSPITAL_COMMUNITY): Payer: 59

## 2014-08-18 MED ORDER — ONDANSETRON HCL 4 MG PO TABS: 4.0000 mg | ORAL_TABLET | Freq: Three times a day (TID) | ORAL | Status: DC | PRN

## 2014-08-18 MED ORDER — HYDROCODONE-ACETAMINOPHEN 5-500 MG PO TABS: 1.0000 | ORAL_TABLET | Freq: Four times a day (QID) | ORAL | Status: DC | PRN

## 2014-08-18 NOTE — Progress Notes (Signed)
Wasted 19mls of fentanyl from PCA in sharps with Brien Mates, RN.

## 2014-08-18 NOTE — Discharge Instructions (Signed)
Lung Resection, Care After Refer to this sheet in the next few weeks. These instructions provide you with information on caring for yourself after your procedure. Your health care provider may also give you more specific instructions. Your treatment has been planned according to current medical practices, but problems sometimes occur. Call your health care provider if you have any problems or questions after your procedure. WHAT TO EXPECT AFTER THE PROCEDURE After your procedure, it is typical to have the following:   You may feel pain in your chest and throat.  Patients may sometimes shiver or feel nauseous during recovery. HOME CARE INSTRUCTIONS  You may resume a normal diet and activities as directed by your health care provider.  Do not use any tobacco products, including cigarettes, chewing tobacco, or electronic cigarettes. If you need help quitting, ask your health care provider.  There are many different ways to close and cover an incision, including stitches, skin glue, and adhesive strips. Follow your health care provider's instructions on:  Incision care.  Bandage (dressing) changes and removal.  Incision closure removal.  Take medicines only as directed by your health care provider.  Keep all follow-up visits as directed by your health care provider. This is important.  Try to breathe deeply and cough as directed. Holding a pillow firmly over your ribs may help with discomfort.  If you were given an incentive spirometer in the hospital, continue to use it as directed by your health care provider.  Walk as directed by your health care provider.  You may take a shower and gently wash the area of your incision with water and soap as directed by your health care provider. Do not use anything else to clean your incision except as directed by your health care provider. Do not take baths, swim, or use a hot tub until your health care provider approves. SEEK MEDICAL CARE  IF:  You notice redness, swelling, or increasing pain at the incision site.  You are bleeding at the incision site.  You see pus coming from the incision site.  You notice a bad smell coming from the incision site or bandage.  Your incision breaks open.  You cough up blood or pus, or you develop a cough that produces bad-smelling sputum.  You have pain or swelling in your legs.  You have increasing pain that is not controlled with medicine.  You have trouble managing any of the tubes that have been left in place after surgery.  You have fever or chills. SEEK IMMEDIATE MEDICAL CARE IF:   You have chest pain or an irregular or rapid heartbeat.  You have dizzy episodes or faint.  You have shortness of breath or difficulty breathing.  You have persistent nausea or vomiting.  You have a rash. Document Released: 12/03/2004 Document Revised: 09/30/2013 Document Reviewed: 07/05/2013 [facility] Patient Information 2015 [place], [place]. This information is not intended to replace advice given to you by your health care provider. Make sure you discuss any questions you have with your health care provider.

## 2014-08-18 NOTE — Progress Notes (Addendum)
[facility] DAILY ICU PROGRESS NOTE                   [address]            [place] [address]          [phone]   4 Days Post-Op Procedure(s) (LRB): VIDEO ASSISTED THORACOSCOPY (VATS)/WEDGE RESECTION (Left) LEFT LOWER LOBECTOMY (Left) CRYO INTERCOSTAL NERVE BLOCK (Left) NODE DISSECTION, LEFT LOWER LOBE LUNG (Left)  Total Length of Stay:  LOS: 4 days   Subjective: Feels pretty well overall  Objective: Vital signs in last 24 hours: Temp:  [97.2 F (36.2 C)-98 F (36.7 C)] 97.8 F (36.6 C) (03/21 0410) Pulse Rate:  [62-72] 72 (03/21 0809) Cardiac Rhythm:  [-] Normal sinus rhythm (03/21 0410) Resp:  [16-24] 16 (03/21 0809) BP: (124-140)/(61-85) 124/68 mmHg (03/21 0809) SpO2:  [93 %-99 %] 93 % (03/21 0809)  Filed Weights   08/14/14 0555 08/14/14 1600  Weight: 139 lb (63.05 kg) 145 lb 4.5 oz (65.9 kg)    Weight change:    Hemodynamic parameters for last 24 hours:    Intake/Output from previous day: 03/20 0701 - 03/21 0700 In: 220 [I.V.:220] Out: 2700 [Urine:2400; Chest Tube:300]  Intake/Output this shift: Total I/O In: -  Out: 650 [Urine:650]  Current Meds: Scheduled Meds: . acetaminophen  1,000 mg Oral 4 times per day   Or  . acetaminophen (TYLENOL) oral liquid 160 mg/5 mL  1,000 mg Oral 4 times per day  . aspirin EC  81 mg Oral Daily  . bisacodyl  10 mg Oral Daily  . buPROPion  150 mg Oral BID  . clopidogrel  75 mg Oral Daily  . enoxaparin (LOVENOX) injection  40 mg Subcutaneous Daily  . fentaNYL   Intravenous 6 times per day  . nicotine  21 mg Transdermal Daily  . rosuvastatin  10 mg Oral q1800  . senna-docusate  1 tablet Oral QHS   Continuous Infusions: . 0.9 % NaCl with KCl 20 mEq / L 10 mL/hr at 08/17/14 1900   PRN Meds:.albuterol, diphenhydrAMINE **OR** diphenhydrAMINE, naloxone **AND** sodium chloride, ondansetron (ZOFRAN) IV, potassium chloride, traMADol  General appearance: no distress Heart: regular rate and rhythm Lungs:  fair air exchange throughout Abdomen: benign Extremities: no edemaa Wound: incis healing well  Lab Results: CBC: Recent Labs  08/16/14 0520  WBC 7.3  HGB 11.3*  HCT 35.1*  PLT 132*   BMET:  Recent Labs  08/16/14 0520  NA 138  K 3.9  CL 109  CO2 25  GLUCOSE 92  BUN 8  CREATININE 0.59  CALCIUM 8.1*    PT/INR: No results for input(s): LABPROT, INR in the last 72 hours. Radiology: Dg Chest Port 1 View  08/17/2014   CLINICAL DATA:  Status post left lobectomy.  Follow-up exam.  EXAM: PORTABLE CHEST - 1 VIEW  COMPARISON:  08/16/2014  FINDINGS: Minimal left apical pneumothorax is without substantial change from the previous day's study.  There is persistent lung base opacity most consistent with atelectasis. No pulmonary edema.  Two left-sided chest tubes are stable. Right internal jugular central venous line is stable with its tip in the lower superior vena cava.  IMPRESSION: 1. No significant change from the previous day's study. 2. Persistent minimal left apical pneumothorax. 3. Persistent lung base opacity most consistent with atelectasis. No pulmonary edema. 4. Left-sided chest tubes and right internal jugular central venous line are stable and well positioned.   Electronically Signed  By: Lajean Manes M.D.   On: 08/17/2014 08:31     Assessment/Plan: S/P Procedure(s) (LRB): VIDEO ASSISTED THORACOSCOPY (VATS)/WEDGE RESECTION (Left) LEFT LOWER LOBECTOMY (Left) CRYO INTERCOSTAL NERVE BLOCK (Left) NODE DISSECTION, LEFT LOWER LOBE LUNG (Left)  Doing well Small left apical pntx, no air leak in H20 seal -chest tube to be d/c'd Push rehab and pulm toilet Home soon    GOLD,WAYNE E 08/18/2014 8:29 AM Patient seen and examined, agree with above CT has been removed and chest x-ray is OK Home later today or tomorrow depending on progress  Remo Lipps C. Roxan Hockey, MD [facility] [phone]

## 2014-08-18 NOTE — Discharge Summary (Signed)
[address]       [place] [address]             [phone]              Discharge Summary  Name: Sharon [name] DOB: Apr 30, 1961 54 y.o. MRN: [id number]   Admission Date: 08/14/2014 Discharge Date:     Admitting Diagnosis: Left lower lobe lung lesion   Discharge Diagnosis:  Poorly differentiated non-small cell carcinoma, left lower lobe  Past Medical History  Diagnosis Date  . Ischemic cardiomyopathy   . Hyperlipidemia   . Tobacco user   . Coronary artery disease   . Occlusion of right coronary artery   . Aortic insufficiency     moderate by echo in 2009  . Acute ST segment elevation MI   . Neoplasm of uncertain behavior of left lower lobe of lung     per CT CHEST/PET 2/4 and 07/18/14 @ [facility]  . S/P coronary artery stent placement     2009  . COPD (chronic obstructive pulmonary disease)   . Emphysema of lung   . Pneumonia     2015   HX BRONCHITIS    . Shortness of breath dyspnea     WITH EXEERTION   . Arthritis   . Cancer     LUNGS     Procedures: LEFT VIDEO ASSISTED THORACOSCOPY -  08/14/2014  LEFT LOWER LOBE WEDGE RESECTION  THORACOSCOPIC LEFT LOWER LOBECTOMY  MEDIASTINAL LYMPH NODE DISSECTION   CRYOANALGESIA OF  INTERCOSTAL NERVES    HPI:  The patient is a 54 y.o. female with a history of tobacco abuse, CAD with prior LAD stent and aortic insufficiency. She has smoked 1- 1.5 ppd since age 37. She was ill in January with fever, congestion and other symptoms suggestive of a respiratory infection. She was treated with antibiotics and a chest x-ray was obtained. It showed a 12 mm nodule in the left lung. A CT showed an 11 mm spiculated nodule in the left lower lobe adjacent to the fissure. She had a PET scan on 07/18/14 which showed the nodule was hypermetabolic with an SUV of 5.68. There was no evidence of regional or distant metastases.  The patient gets short of breath with exertion, walking a mile or two flights of  stairs. She has a chronic cough. She denies hemoptysis. She has lost 8 pounds over the past 3 months due to a poor appetite. She has right sided chest wall pain due to a rib fracture. She is down to about 1/2 ppd smoking. She does have "tension" headaches, but these have been going on for years.   The patient was referred to Dr. Roxan Hockey for thoracic surgical evaluation. After he saw the patient and reviewed her films, he felt that the lesion was highly suspicious for a primary bronchogenic carcinoma.  He recommended proceeding with a VATS resection at this time. All risks, benefits and alternatives of surgery were explained in detail, and the patient agreed to proceed.  She was asked to hold her Plavix in preparation for the surgery.    Hospital Course:  The patient was admitted to [facility] on 08/14/2014. The patient was taken to the operating room and underwent the above procedure.    The postoperative course was notable initially for nausea related to Dilaudid PCA.  This was switched to Fentanyl and her nausea resolved. Otherwise she has remained stable.  Chest tubes have been removed in the standard  fashion and follow up chest x-rays have remained stable, with a small apical pneumothorax.  Incisions are healing well.  The patient is ambulating in the halls without difficulty and is tolerating a regular diet.  Final pathology is pending, but the intraoperative frozen section was positive for poorly differentiated non-small cell carcinoma.  She has been evaluated on today's date and is medically stable for discharge home.     Recent vital signs:  Filed Vitals:   08/18/14 0809  BP: 124/68  Pulse: 72  Temp: 97.4 F (36.3 C)  Resp: 16    Recent laboratory studies:  CBC: Recent Labs  08/16/14 0520  WBC 7.3  HGB 11.3*  HCT 35.1*  PLT 132*   BMET:  Recent Labs  08/16/14 0520  NA 138  K 3.9  CL 109  CO2 25  GLUCOSE 92  BUN 8  CREATININE 0.59  CALCIUM 8.1*    PT/INR: No  results for input(s): LABPROT, INR in the last 72 hours.    Medication List    TAKE these medications        alendronate 70 MG tablet  Commonly known as:  FOSAMAX  Take 70 mg by mouth once a week.     ASPIR-LOW 81 MG EC tablet  Generic drug:  aspirin  Take 81 mg by mouth daily. On hold     buPROPion 150 MG 12 hr tablet  Commonly known as:  WELLBUTRIN SR  Take 150 mg by mouth 2 (two) times daily.     clopidogrel 75 MG tablet  Commonly known as:  PLAVIX  Take 1 tablet (75 mg total) by mouth daily.     HYDROcodone-acetaminophen 5-500 MG per tablet  Commonly known as:  VICODIN  Take 1 tablet by mouth every 6 (six) hours as needed for pain.     nicotine 21 mg/24hr patch  Commonly known as:  NICODERM CQ - dosed in mg/24 hours  Place 21 mg onto the skin daily.     ondansetron 4 MG tablet  Commonly known as:  ZOFRAN  Take 1 tablet (4 mg total) by mouth every 8 (eight) hours as needed for nausea or vomiting.     rosuvastatin 10 MG tablet  Commonly known as:  CRESTOR  Take 1 tablet (10 mg total) by mouth daily.     VITAMIN B 12 PO  Take by mouth daily.     Vitamin D-3 1000 UNITS Caps  Take 1 capsule by mouth daily.     vitamin E 400 UNIT capsule  Take 400 Units by mouth daily.         Discharge Instructions:  The patient is to refrain from driving, heavy lifting or strenuous activity.  May shower daily and clean incisions with soap and water.  May resume regular diet.   Follow Up: Follow-up Information    Follow up with Melrose Nakayama, MD On 09/09/2014.   Specialty:  Cardiothoracic Surgery   Why:  Have a chest x-ray at [facility] at 12:45, then see MD at 1:45   Contact information:   [place] [address] [phone]       Follow up with [facility] On 08/25/2014.   Why:  For suture removal at 10:30 with the nurse   Contact information:   [address] [place] [phone]             Burke Keels 08/18/2014, 12:55 PM

## 2014-08-18 NOTE — Progress Notes (Signed)
Pt given discharge packet, education and medication regimen reviewed with patient. Patient and daughter state no further questions.

## 2014-08-25 ENCOUNTER — Other Ambulatory Visit: Payer: Self-pay | Admitting: *Deleted

## 2014-08-25 ENCOUNTER — Encounter (INDEPENDENT_AMBULATORY_CARE_PROVIDER_SITE_OTHER): Payer: Self-pay | Admitting: *Deleted

## 2014-08-25 DIAGNOSIS — C3432 Malignant neoplasm of lower lobe, left bronchus or lung: Secondary | ICD-10-CM

## 2014-08-25 DIAGNOSIS — Z4802 Encounter for removal of sutures: Secondary | ICD-10-CM

## 2014-08-25 DIAGNOSIS — Z902 Acquired absence of lung [part of]: Secondary | ICD-10-CM

## 2014-08-25 DIAGNOSIS — G8918 Other acute postprocedural pain: Secondary | ICD-10-CM

## 2014-08-25 DIAGNOSIS — R911 Solitary pulmonary nodule: Secondary | ICD-10-CM

## 2014-08-25 MED ORDER — TRAMADOL HCL 50 MG PO TABS: 50.0000 mg | ORAL_TABLET | Freq: Four times a day (QID) | ORAL | Status: DC | PRN

## 2014-08-25 NOTE — Progress Notes (Signed)
Sharon [name] returns for suture removal  of two previous chest tube sites.  These were easily removed.  These sites asa well as the mini thoracotomy incision are all very well healed.  Appetite and bowels are good.  She is having the usual tingling, discomfort associated with this surgery.  She had an unfortunate experience with her pharmacy and the pain medication that was prescribed for her on discharge....  Hydrocodone 5/500.  She was informed that this med was not made anymore but no effort was made to help her get a new med. Our doctors were not notified.  I said that I would get a Tramadol script signed today and faxed to her pharmacy.  She will return as scheduled with a chest xray as scheduled.

## 2014-09-08 ENCOUNTER — Other Ambulatory Visit: Payer: Self-pay | Admitting: Thoracic Surgery (Cardiothoracic Vascular Surgery)

## 2014-09-08 DIAGNOSIS — D381 Neoplasm of uncertain behavior of trachea, bronchus and lung: Secondary | ICD-10-CM

## 2014-09-09 ENCOUNTER — Encounter: Payer: Self-pay | Admitting: Thoracic Surgery (Cardiothoracic Vascular Surgery)

## 2014-09-09 ENCOUNTER — Ambulatory Visit (INDEPENDENT_AMBULATORY_CARE_PROVIDER_SITE_OTHER): Payer: Self-pay | Admitting: Thoracic Surgery (Cardiothoracic Vascular Surgery)

## 2014-09-09 ENCOUNTER — Ambulatory Visit
Admission: RE | Admit: 2014-09-09 | Discharge: 2014-09-09 | Disposition: A | Discharge status: Home / Self Care | Payer: 59 | Source: Ambulatory Visit | Attending: Thoracic Surgery (Cardiothoracic Vascular Surgery) | Admitting: Thoracic Surgery (Cardiothoracic Vascular Surgery)

## 2014-09-09 VITALS — BP 124/80 | HR 90 | Resp 20 | Ht 64.0 in | Wt 138.0 lb

## 2014-09-09 DIAGNOSIS — D381 Neoplasm of uncertain behavior of trachea, bronchus and lung: Secondary | ICD-10-CM

## 2014-09-09 DIAGNOSIS — Z902 Acquired absence of lung [part of]: Secondary | ICD-10-CM | POA: Insufficient documentation

## 2014-09-09 DIAGNOSIS — G8918 Other acute postprocedural pain: Secondary | ICD-10-CM

## 2014-09-09 DIAGNOSIS — Z9889 Other specified postprocedural states: Secondary | ICD-10-CM

## 2014-09-09 DIAGNOSIS — C3432 Malignant neoplasm of lower lobe, left bronchus or lung: Secondary | ICD-10-CM | POA: Insufficient documentation

## 2014-09-09 MED ORDER — TRAMADOL HCL 50 MG PO TABS: 50.0000 mg | ORAL_TABLET | Freq: Four times a day (QID) | ORAL | Status: DC | PRN

## 2014-09-09 NOTE — Progress Notes (Signed)
[address]       [place] [address]             [phone]       HPI: Sharon [name] returns today for scheduled postoperative follow-up visit.  She is a 54 year old woman with a history of tobacco abuse who underwent a thoracoscopic left lower lobectomy on 08/14/2014 for a stage IB (T2a, N0) non-small cell carcinoma. Her postoperative course was uncomplicated and she was discharged on day 4.  Since discharge she's been doing well. She does complain of some numbness and mild tingling in her left anterior chest wall. She says the actual pain has improved. She is still taking tramadol 50 mg about 2 times daily for pain. She's also using ibuprofen. She has not smoked since a week prior to surgery. She does still have cravings.  Past Medical History  Diagnosis Date  . Ischemic cardiomyopathy   . Hyperlipidemia   . Tobacco user   . Coronary artery disease   . Occlusion of right coronary artery   . Aortic insufficiency     moderate by echo in 2009  . Acute ST segment elevation MI   . Neoplasm of uncertain behavior of left lower lobe of lung     per CT CHEST/PET 2/4 and 07/18/14 @ [facility]  . S/P coronary artery stent placement     2009  . COPD (chronic obstructive pulmonary disease)   . Emphysema of lung   . Pneumonia     2015   HX BRONCHITIS    . Shortness of breath dyspnea     WITH EXEERTION   . Arthritis   . Cancer     LUNGS    Current Outpatient Prescriptions  Medication Sig Dispense Refill  . alendronate (FOSAMAX) 70 MG tablet Take 70 mg by mouth once a week.  0  . aspirin (ASPIR-LOW) 81 MG EC tablet Take 81 mg by mouth daily. On hold    . buPROPion (WELLBUTRIN SR) 150 MG 12 hr tablet Take 150 mg by mouth 2 (two) times daily.   0  . Cholecalciferol (VITAMIN D-3) 1000 UNITS CAPS Take 1 capsule by mouth daily.    . clopidogrel (PLAVIX) 75 MG tablet Take 1 tablet (75 mg total) by mouth daily. 90 tablet 3  . Cyanocobalamin (VITAMIN B 12 PO) Take by  mouth daily.    . ondansetron (ZOFRAN) 4 MG tablet Take 1 tablet (4 mg total) by mouth every 8 (eight) hours as needed for nausea or vomiting. 20 tablet 0  . rosuvastatin (CRESTOR) 10 MG tablet Take 1 tablet (10 mg total) by mouth daily. 90 tablet 3  . traMADol (ULTRAM) 50 MG tablet Take 1 tablet (50 mg total) by mouth every 6 (six) hours as needed. 30 tablet 0  . vitamin E 400 UNIT capsule Take 400 Units by mouth daily.     No current facility-administered medications for this visit.    Physical Exam BP 124/80 mmHg  Pulse 90  Resp 20  Ht 5\' 4"  (1.626 m)  Wt 138 lb (62.596 kg)  BMI 23.68 kg/m2  SpO50 23% 54 year old woman in no acute distress Alert and oriented 3 with no focal deficits Incisions healing well Cardiac regular rate and rhythm normal S1 and S2 Lungs diminished breath sounds left base, otherwise clear  Diagnostic Tests: I reviewed the Chest x-ray. It shows postoperative changes from left lower lobectomy. There are no effusions or infiltrates.  Impression: 54 year old woman who  is now about 3 weeks out from a thoracoscopic left lower lobectomy and node dissection for a stage IB non-small cell carcinoma. This was a small tumor but was T2a due to involvement of the visceral pleura. 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 Given these findings I do think an oncology consultation would be appropriate. I for her the option of going to [facility] or seeing an oncologist here. She wishes to go through our [facility].  She's doing well from a surgical standpoint. Her incisions look good. She's having some mild paresthesias which are common at the surgery. We did do cryo-analgesia of the intercostal nerves, so she may actually have more pain and paresthesias later on. For now her discomforts controlled with low-dose tramadol couple times a day and ibuprofen.  She has stopped smoking. She was counseled again on the importance of this going  forward.  I do think she would benefit from pulmonary rehabilitation. We will make a referral.  She may begin driving when she no longer is taking tramadol during the day. Appropriate precautions were discussed with she does resume driving. There are no other restrictions on her activities, but she should build into new activities gradually to avoid undue discomfort.  She is anxious to return to work. I think she probably will be able to do so in about 3 more weeks.  Plan: Referral to pulmonary rehabilitation  Referral to [facility] to see oncology  I will see her back in 2 months with a PA and lateral chest x-ray to check on her progress.  Melrose Nakayama, MD [facility] [phone]

## 2014-09-17 ENCOUNTER — Telehealth: Payer: Self-pay | Admitting: *Deleted

## 2014-09-17 NOTE — Telephone Encounter (Signed)
Called to schedule appt for thoracic clinic on 09/25/14 arrive at 3:30.  She verbalized understanding of appt time and place.

## 2014-09-19 ENCOUNTER — Telehealth (HOSPITAL_COMMUNITY): Payer: Self-pay

## 2014-09-19 NOTE — Telephone Encounter (Signed)
Called patient regarding entrance to Pulmonary Rehab. Patient is interested in the program but she stated she has to go back to work full time next week for income. She works for a [employer] and has not pay when out of work. She will call back to enroll in the program if it is determined that she can't continue to work.

## 2014-09-24 ENCOUNTER — Telehealth: Payer: Self-pay | Admitting: *Deleted

## 2014-09-24 NOTE — Telephone Encounter (Signed)
Called and left message about clinic appt tomorrow w/ time of arrive and to call w/ any questions.

## 2014-09-25 ENCOUNTER — Ambulatory Visit: Payer: 59 | Attending: Internal Medicine | Admitting: Physical Therapy

## 2014-09-25 ENCOUNTER — Encounter: Payer: Self-pay | Admitting: Internal Medicine

## 2014-09-25 ENCOUNTER — Ambulatory Visit (HOSPITAL_BASED_OUTPATIENT_CLINIC_OR_DEPARTMENT_OTHER): Payer: 59 | Admitting: Internal Medicine

## 2014-09-25 ENCOUNTER — Encounter: Payer: Self-pay | Admitting: *Deleted

## 2014-09-25 ENCOUNTER — Telehealth: Payer: Self-pay | Admitting: Internal Medicine

## 2014-09-25 VITALS — BP 148/83 | HR 86 | Temp 98.3°F | Resp 18 | Ht 64.0 in | Wt 145.7 lb

## 2014-09-25 DIAGNOSIS — Z87891 Personal history of nicotine dependence: Secondary | ICD-10-CM | POA: Diagnosis not present

## 2014-09-25 DIAGNOSIS — C3432 Malignant neoplasm of lower lobe, left bronchus or lung: Secondary | ICD-10-CM

## 2014-09-25 DIAGNOSIS — R5381 Other malaise: Secondary | ICD-10-CM | POA: Diagnosis present

## 2014-09-25 DIAGNOSIS — Z808 Family history of malignant neoplasm of other organs or systems: Secondary | ICD-10-CM

## 2014-09-25 DIAGNOSIS — Z803 Family history of malignant neoplasm of breast: Secondary | ICD-10-CM

## 2014-09-25 NOTE — Telephone Encounter (Signed)
Gave avs & calendar for October. °

## 2014-09-25 NOTE — Therapy (Signed)
[place], [place], [address] Phone: [phone]   Fax:  [phone]  Physical Therapy Evaluation  Patient Details  Name: Sharon [name] MRN: [id number] Date of Birth: December 01, 1960 Referring Provider:  Curt Bears, MD  Encounter Date: 09/25/2014      PT End of Session - 09/25/14 1658    Visit Number 1   Number of Visits 1   PT Start Time 8250   PT Stop Time 1600   PT Time Calculation (min) 15 min   Activity Tolerance Patient tolerated treatment well   Behavior During Therapy [facility] for tasks assessed/performed      Past Medical History  Diagnosis Date  . Ischemic cardiomyopathy   . Hyperlipidemia   . Tobacco user   . Coronary artery disease   . Occlusion of right coronary artery   . Aortic insufficiency     moderate by echo in 2009  . Acute ST segment elevation MI   . Neoplasm of uncertain behavior of left lower lobe of lung     per CT CHEST/PET 2/4 and 07/18/14 @ [facility]  . S/P coronary artery stent placement     2009  . COPD (chronic obstructive pulmonary disease)   . Emphysema of lung   . Pneumonia     2015   HX BRONCHITIS    . Shortness of breath dyspnea     WITH EXEERTION   . Arthritis   . Cancer     LUNGS    Past Surgical History  Procedure Laterality Date  . Cholecystectomy    . Tubal ligation    . Ectopic pregnancies requiring laparotomies    . Cardiac catheterization  10/15/2007    EF 30-40%  . Coronary angioplasty with stent placement      LAD  . US echocardiography  01/07/2008    EF 55-60%  . Transthoracic echocardiogram  10/17/2007  . Cesarean section    . Shoulder surgery      LEFT X 2   (REMOVED SOME COLLAR BONE )  . Video assisted thoracoscopy (vats)/wedge resection Left 08/14/2014    Procedure: VIDEO ASSISTED THORACOSCOPY (VATS)/WEDGE RESECTION;  Surgeon: Melrose Nakayama, MD;  Location: [facility];  Service: Thoracic;  Laterality: Left;  . Lobectomy Left  08/14/2014    Procedure: LEFT LOWER LOBECTOMY;  Surgeon: Melrose Nakayama, MD;  Location: [facility];  Service: Thoracic;  Laterality: Left;  . Lead removal Left 08/14/2014    Procedure: CRYO INTERCOSTAL NERVE BLOCK;  Surgeon: Melrose Nakayama, MD;  Location: [facility];  Service: Thoracic;  Laterality: Left;  . Node dissection Left 08/14/2014    Procedure: NODE DISSECTION, LEFT LOWER LOBE LUNG;  Surgeon: Melrose Nakayama, MD;  Location: [facility];  Service: Thoracic;  Laterality: Left;    There were no vitals filed for this visit.  Visit Diagnosis:  Physical deconditioning - Plan: PT plan of care cert/re-cert      Subjective Assessment - 09/25/14 1648    Subjective Patient with shortness of breath with walking a distance.   Pertinent History Pt. presented with upper respiratory symptoms in January 2016 and was treated with antibiotics; follow-up chest x-ray and CT showed abnormalities.  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  Patient had VATS lobectomy on 08/14/14; plan is to follow her with regular scans to monitor her status.  h/o cardiac stent placement 2009; h/o right anterior rib fracture;  h/o smoking.   Currently in Pain? No/denies            [facility] PT Assessment - 09/25/14 0001    Assessment   Medical Diagnosis left lower lobe adenocarcinoma, T2a   Onset Date 05/30/14  approx.   Precautions   Precautions Other (comment)  cancer precautions   Restrictions   Weight Bearing Restrictions No   Balance Screen   Has the patient fallen in the past 6 months No   Has the patient had a decrease in activity level because of a fear of falling?  No   Is the patient reluctant to leave their home because of a fear of falling?  Yes  only when icy outside (h/o falling and very fearful)   Home Environment   Living Enviornment Private residence   Living Arrangements Alone   Type of Home Other(Comment)  duplex    Home Access Stairs to enter  in front only   Prior Function   Level of Independence Independent with basic ADLs;Independent with homemaking with ambulation;Independent with gait   Vocation Full time employment  12 hour days   Leisure no regular exercise; walks from parking lot to workplace   Observation/Other Assessments   Observations anxious looking woman looking older than her stated age   Functional Tests   Functional tests Sit to Stand   Sit to Stand   Comments 9 times in 30 seconds; this is low for her age   Posture/Postural Control   Posture/Postural Control No significant limitations   ROM / Strength   AROM / PROM / Strength AROM;Strength   AROM   Overall AROM  Within functional limits for tasks performed  but c/o dizziness with trunk extension in standing   Overall AROM Comments standing trunk AROM performed   Strength   Overall Strength Other (comment)   Overall Strength Comments not formally tested but functional   Ambulation/Gait   Ambulation/Gait Yes   Ambulation/Gait Assistance 6: Modified independent (Device/Increase time)  reports some shortness of breath on ambulation since surgery   Balance   Balance Assessed Yes   Dynamic Standing Balance   Dynamic Standing - Comments reaches 13 inches forward in standing                           PT Education - 09/25/14 1657    Education provided Yes   Education Details posture, breathing, walking or other aerobic exercise, physical therapy info   Person(s) Educated Patient   Methods Explanation;Handout   Comprehension Verbalized understanding               Lung Clinic Goals - 09/25/14 1701    Patient will be able to verbalize understanding of the benefit of exercise to decrease fatigue.   Status Achieved   Patient will be able to verbalize the importance of posture.   Status Achieved   Patient will be able to demonstrate diaphragmatic breathing for improved lung function.   Status  Achieved   Patient will be able to verbalize understanding of the role of physical therapy to prevent functional decline and who to contact if physical therapy is needed.   Status Achieved             Plan - 09/25/14 1658    Clinical Impression Statement Patient with lung cancer diagnosis, stage 2a with recent lobectomy, now with limited endurance and somewhat sedentary lifestyle.  Appears deconditioned, only able to do 9 repetitions of  sit to stand in 30 seconds.   Pt will benefit from skilled therapeutic intervention in order to improve on the following deficits Cardiopulmonary status limiting activity;Decreased endurance;Decreased strength   Rehab Potential Excellent   PT Frequency One time visit   PT Treatment/Interventions Patient/family education   PT Next Visit Plan none at this time   PT Home Exercise Plan see education section   Consulted and Agree with Plan of Care Patient         Problem List Patient Active Problem List   Diagnosis Date Noted  . S/P lobectomy of lung 09/09/2014  . Malignant neoplasm of lower lobe of left lung 09/09/2014  . 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 Carotid artery disease 03/09/2011  . Weight loss 03/09/2011  . Hyperlipidemia 11/24/2009  . TOBACCO ABUSE 11/24/2009  . CARDIOMYOPATHY, ISCHEMIC 11/24/2009  . CAD (coronary artery disease) 11/24/2009  . FATIGUE 11/24/2009  . NAUSEA AND VOMITING 11/24/2009    SALISBURY,DONNA 09/25/2014, [place], [place], [address] Phone: [phone]   Fax:  [place], PT 09/25/2014 5:02 PM

## 2014-09-25 NOTE — Progress Notes (Signed)
[facility] [phone]   [phone] Multidisciplinary thoracic oncology clinic  CONSULT NOTE  REFERRING PHYSICIAN: Dr. Modesto Charon  REASON FOR CONSULTATION:  54 years old white female recently diagnosed with lung cancer.  HPI Sharon [name] is a 54 y.o. female with past medical history significant for coronary heart disease, cardiomyopathy, aortic insufficiency, dyslipidemia, carotid artery stenosis as well as chronic fatigue and long history of smoking but quit in March 2016. The patient mentions that she has been complaining of shortness of breath and cough since January 2016. She had chest x-ray performed in 06/24/2014 that showed vague nodular density on the left. This was followed by CT scan of the chest at [facility] in [facility] and it showed 1.1 cm spiculated nodule in the left lower lobe causing adjacent fissure tenting concerning for malignancy. A PET scan was also performed at [facility] and it showed metabolically active left lower lobe pulmonary nodule suspicious for neoplasm. There was no hypermetabolic mediastinal or hilar lymphadenopathy and no findings for metastatic disease. There was also emphysematous changes but no other suspicious pulmonary lesions. The patient was referred to Dr. Roxan Hockey and on 08/14/2014 she underwent Left video-assisted thoracoscopy, wedge resection left lower lobe, thoracoscopic left lower lobectomy and mediastinal lymph node dissection.  The final pathologyb (Accession: [id number]) showed invasive well to moderately to poorly differentiated adenocarcinoma spanning 1.2 cm in greatest dimension with tumor involving the visceral pleura and evidence for lymphovascular invasion. The dissected lymph nodes were negative for malignancy.  The patient is recovering well from her surgery and she was referred to me today for evaluation and recommendation regarding treatment of her  condition. When seen today she continues to have some soreness and numbness in the left side of the chest at the surgical scar. She has shortness breath with exertion but no significant cough or hemoptysis. The patient has no significant weight loss or night sweats. She has no headache or visual changes. Family history significant for mother with diabetes mellitus and hypertension. Father had pancreatic cancer and 2 sisters with breast cancer. The patient is single and has 1 daughter. She works for the [employer]. She has a history of smoking more than one pack per day for around 41 years and quit 08/07/2014. The patient has no history of alcohol or drug abuse.  HPI  Past Medical History  Diagnosis Date  . Ischemic cardiomyopathy   . Hyperlipidemia   . Tobacco user   . Coronary artery disease   . Occlusion of right coronary artery   . Aortic insufficiency     moderate by echo in 2009  . Acute ST segment elevation MI   . Neoplasm of uncertain behavior of left lower lobe of lung     per CT CHEST/PET 2/4 and 07/18/14 @ [facility]  . S/P coronary artery stent placement     2009  . COPD (chronic obstructive pulmonary disease)   . Emphysema of lung   . Pneumonia     2015   HX BRONCHITIS    . Shortness of breath dyspnea     WITH EXEERTION   . Arthritis   . Cancer     LUNGS    Past Surgical History  Procedure Laterality Date  . Cholecystectomy    . Tubal ligation    . Ectopic pregnancies requiring laparotomies    . Cardiac catheterization  10/15/2007    EF 30-40%  . Coronary angioplasty with stent placement  LAD  . US echocardiography  01/07/2008    EF 55-60%  . Transthoracic echocardiogram  10/17/2007  . Cesarean section    . Shoulder surgery      LEFT X 2   (REMOVED SOME COLLAR BONE )  . Video assisted thoracoscopy (vats)/wedge resection Left 08/14/2014    Procedure: VIDEO ASSISTED THORACOSCOPY (VATS)/WEDGE RESECTION;  Surgeon: Melrose Nakayama, MD;   Location: [place];  Service: Thoracic;  Laterality: Left;  . Lobectomy Left 08/14/2014    Procedure: LEFT LOWER LOBECTOMY;  Surgeon: Melrose Nakayama, MD;  Location: [facility];  Service: Thoracic;  Laterality: Left;  . Lead removal Left 08/14/2014    Procedure: CRYO INTERCOSTAL NERVE BLOCK;  Surgeon: Melrose Nakayama, MD;  Location: [facility];  Service: Thoracic;  Laterality: Left;  . Node dissection Left 08/14/2014    Procedure: NODE DISSECTION, LEFT LOWER LOBE LUNG;  Surgeon: Melrose Nakayama, MD;  Location: [facility];  Service: Thoracic;  Laterality: Left;    Family History  Problem Relation Age of Onset  . Diabetes Mother   . Hypertension Mother   . Cancer Father     pancreatic and esophageal  . Obesity Sister   . Cancer Sister     breast    Social History History  Substance Use Topics  . Smoking status: Former Smoker -- 1.00 packs/day for 36 years    Types: Cigarettes    Quit date: 08/07/2014  . Smokeless tobacco: Not on file  . Alcohol Use: Yes     Comment: occasional    (RARELY)    Allergies  Allergen Reactions  . Codeine Shortness Of Breath, Nausea Only and Swelling    Tongue swells  . Lipitor [Atorvastatin Calcium] Other (See Comments)    Muscle ache  . Morphine And Related Nausea And Vomiting  . Oxycodone-Acetaminophen Nausea And Vomiting    Current Outpatient Prescriptions  Medication Sig Dispense Refill  . alendronate (FOSAMAX) 70 MG tablet Take 70 mg by mouth once a week.  0  . aspirin (ASPIR-LOW) 81 MG EC tablet Take 81 mg by mouth daily. On hold    . buPROPion (WELLBUTRIN SR) 150 MG 12 hr tablet Take 150 mg by mouth 2 (two) times daily.   0  . Cholecalciferol (VITAMIN D-3) 1000 UNITS CAPS Take 1 capsule by mouth daily.    . clopidogrel (PLAVIX) 75 MG tablet Take 1 tablet (75 mg total) by mouth daily. 90 tablet 3  . Cyanocobalamin (VITAMIN B 12 PO) Take by mouth daily.    . rosuvastatin (CRESTOR) 10 MG tablet Take 1 tablet (10 mg total) by mouth daily. 90  tablet 3  . vitamin E 400 UNIT capsule Take 400 Units by mouth daily.    . traMADol (ULTRAM) 50 MG tablet Take 1 tablet (50 mg total) by mouth every 6 (six) hours as needed. (Patient not taking: Reported on 09/25/2014) 30 tablet 0   No current facility-administered medications for this visit.    Review of Systems  Constitutional: negative Eyes: negative Ears, nose, mouth, throat, and face: negative Respiratory: positive for pleurisy/chest pain Cardiovascular: negative Gastrointestinal: negative Genitourinary:negative Integument/breast: negative Hematologic/lymphatic: negative Musculoskeletal:negative Neurological: negative Behavioral/Psych: negative Endocrine: negative Allergic/Immunologic: negative  Physical Exam  ZOX:WRUEA, healthy, no distress, well nourished and well developed SKIN: skin color, texture, turgor are normal, no rashes or significant lesions HEAD: Normocephalic, No masses, lesions, tenderness or abnormalities EYES: normal, PERRLA EARS: External ears normal, Canals clear OROPHARYNX:no exudate, no erythema and lips, buccal mucosa, and  tongue normal  NECK: supple, no adenopathy, no JVD LYMPH:  no palpable lymphadenopathy, no hepatosplenomegaly BREAST:not examined LUNGS: clear to auscultation , and palpation HEART: regular rate & rhythm, no murmurs and no gallops ABDOMEN:abdomen soft, non-tender, normal bowel sounds and no masses or organomegaly BACK: Back symmetric, no curvature., No CVA tenderness EXTREMITIES:no joint deformities, effusion, or inflammation, no edema, no skin discoloration, no clubbing  NEURO: alert & oriented x 3 with fluent speech, no focal motor/sensory deficits  PERFORMANCE STATUS: ECOG 0  LABORATORY DATA: Lab Results  Component Value Date   WBC 7.3 08/16/2014   HGB 11.3* 08/16/2014   HCT 35.1* 08/16/2014   MCV 100.3* 08/16/2014   PLT 132* 08/16/2014      Chemistry      Component Value Date/Time   NA 138 08/16/2014 0520   K  3.9 08/16/2014 0520   CL 109 08/16/2014 0520   CO2 25 08/16/2014 0520   BUN 8 08/16/2014 0520   CREATININE 0.59 08/16/2014 0520      Component Value Date/Time   CALCIUM 8.1* 08/16/2014 0520   ALKPHOS 53 08/16/2014 0520   AST 27 08/16/2014 0520   ALT 20 08/16/2014 0520   BILITOT 0.7 08/16/2014 0520       RADIOGRAPHIC STUDIES: Dg Chest 2 View  09/09/2014   CLINICAL DATA:  Neoplasm of uncertain behavior LEFT lower lobe, status post VATS wedge resection. Shortness of breath.  EXAM: CHEST  2 VIEW  COMPARISON:  08/18/2014.  FINDINGS: The lungs are re-expanded. Mild scarring LEFT lung base with COPD. No effusion or pneumothorax. Normal heart size. Bones unremarkable. The previously identified LEFT lower lobe nodule is surgically absent.  IMPRESSION: Satisfactory appearance status post wedge resection.   Electronically Signed   By: Rolla Flatten M.D.   On: 09/09/2014 13:18    ASSESSMENT: This is a very pleasant 54 years old white female recently diagnosed with a stage IB (T2a, N0, M0) non-small cell lung cancer, adenocarcinoma diagnosed in March 2016 status post left lower lobectomy with mediastinal lymph node dissection.   PLAN: I had a lengthy discussion with the patient today about her current disease stage, prognosis and treatment options. I explained to the patient that there was no survival benefit for adjuvant chemotherapy for patient with stage IB of the tumor size was less than 4.0 cm and the current standard of care is observation and close monitoring. I recommended for the patient to continue on observation for now with repeat CT scan of the chest in 6 months. The patient is recovering well from her surgery and currently has no symptoms except for slight numbness and soreness on the left side of the chest from the recent surgical resection. The patient was seen during the multidisciplinary thoracic oncology clinic today by medical oncology, thoracic navigator, social worker as well as  physical therapist. She was advised to call immediately if she has any concerning symptoms in the interval. The patient voices understanding of current disease status and treatment options and is in agreement with the current care plan.  All questions were answered. The patient knows to call the clinic with any problems, questions or concerns. We can certainly see the patient much sooner if necessary.  Thank you so much for allowing me to participate in the care of Herman. I will continue to follow up the patient with you and assist in her care.  I spent 40 minutes counseling the patient face to face. The total time spent in the appointment  was 60 minutes.  Disclaimer: This note was dictated with voice recognition software. Similar sounding words can inadvertently be transcribed and may not be corrected upon review.   Jguadalupe Opiela K. September 25, 2014, 4:27 PM

## 2014-09-25 NOTE — [facility] Oncology Navigator Note (Unsigned)
   Thoracic Treatment Summary Name:Barb PAVNEET MARKWOOD Date:09/25/2014 DOB:09-06-1960 Your Medical Team Medical Oncologist:Dr. Julien Nordmann Surgeon:Dr. Roxan Hockey Type and Stage of Lung Cancer Non-Small Cell Carcinoma: Adenocarcinoma   Clinical Stage:  Malignant neoplasm of lower lobe of left lung   Staging form: Lung, AJCC 7th Edition     Clinical stage from 09/25/2014: Stage IB (T2a, N0, M0) - Signed by Curt Bears, MD on 09/25/2014    Clinical stage is based on radiology exams.  Pathological stage will be determined after surgery.  Staging is based on the size of the tumor, involvement of lymph nodes or not, and whether or not the cancer center has spread. Recommendations Recommendations: Observation  These recommendations are based on information available as of today's consult.  This is subject to change depending further testing or exams. Next Steps Next Step: Medical Oncology will set up follow up appointments  03/30/2015 Follow up appointment with Dr. Julien Nordmann Barriers to Care What do you perceive as a potential barrier that may prevent you from receiving your treatment plan? Education-information given and explained Support-information on support programs at [employer] given and explained   Resources Given: [place] on [employer] at [employer].Radonna Ricker [phone] Fall Risk Information    Questions Norton Blizzard, RN BSN Thoracic Oncology Nurse Navigator at [facility] is a nurse navigator that is available to assist you through your cancer journey.  She can answer your questions and/or provide resources regarding your treatment plan, emotional support, or financial concerns.

## 2014-11-10 ENCOUNTER — Other Ambulatory Visit: Payer: Self-pay | Admitting: Thoracic Surgery (Cardiothoracic Vascular Surgery)

## 2014-11-10 DIAGNOSIS — C3432 Malignant neoplasm of lower lobe, left bronchus or lung: Secondary | ICD-10-CM

## 2014-11-11 ENCOUNTER — Ambulatory Visit
Admission: RE | Admit: 2014-11-11 | Discharge: 2014-11-11 | Disposition: A | Discharge status: Home / Self Care | Payer: 59 | Source: Ambulatory Visit | Attending: Thoracic Surgery (Cardiothoracic Vascular Surgery) | Admitting: Thoracic Surgery (Cardiothoracic Vascular Surgery)

## 2014-11-11 ENCOUNTER — Ambulatory Visit (INDEPENDENT_AMBULATORY_CARE_PROVIDER_SITE_OTHER): Payer: 59 | Admitting: Thoracic Surgery (Cardiothoracic Vascular Surgery)

## 2014-11-11 ENCOUNTER — Encounter: Payer: Self-pay | Admitting: Thoracic Surgery (Cardiothoracic Vascular Surgery)

## 2014-11-11 VITALS — BP 130/76 | HR 80 | Resp 20 | Ht 64.0 in | Wt 152.0 lb

## 2014-11-11 DIAGNOSIS — C3432 Malignant neoplasm of lower lobe, left bronchus or lung: Secondary | ICD-10-CM

## 2014-11-11 DIAGNOSIS — Z902 Acquired absence of lung [part of]: Secondary | ICD-10-CM

## 2014-11-11 DIAGNOSIS — Z9889 Other specified postprocedural states: Secondary | ICD-10-CM

## 2014-11-11 NOTE — Progress Notes (Signed)
[address]       [place] [address]             [phone]       HPI:  Ms. Wegman returns today for a scheduled 3 month follow-up visit.  She is a 54 year old woman with a history of tobacco abuse who had a thoracoscopic left lower lobectomy on 08/14/2014 for a stage IB (T2a, N0) non-small cell carcinoma.  Her postoperative course was uncomplicated. She was last seen in the office on 09/09/2014. At that time she was doing well.   She had 2 family members who had bad outcomes at [facility] and requested to see an oncologist here in [place]. She saw Dr. Julien Nordmann last month. He recommended observation and no adjuvant chemotherapy.  She is not having pain anymore but does feel like there is a knot or lump in her left subcostal region.  She has not smoked since her surgery.  Past Medical History  Diagnosis Date  . Ischemic cardiomyopathy   . Hyperlipidemia   . Tobacco user   . Coronary artery disease   . Occlusion of right coronary artery   . Aortic insufficiency     moderate by echo in 2009  . Acute ST segment elevation MI   . Neoplasm of uncertain behavior of left lower lobe of lung     per CT CHEST/PET 2/4 and 07/18/14 @ [facility]  . S/P coronary artery stent placement     2009  . COPD (chronic obstructive pulmonary disease)   . Emphysema of lung   . Pneumonia     2015   HX BRONCHITIS    . Shortness of breath dyspnea     WITH EXEERTION   . Arthritis   . Cancer     LUNGS      Current Outpatient Prescriptions  Medication Sig Dispense Refill  . aspirin (ASPIR-LOW) 81 MG EC tablet Take 81 mg by mouth daily. On hold    . buPROPion (WELLBUTRIN SR) 150 MG 12 hr tablet Take 150 mg by mouth 2 (two) times daily.   0  . Cholecalciferol (VITAMIN D-3) 1000 UNITS CAPS Take 1 capsule by mouth daily.    . clopidogrel (PLAVIX) 75 MG tablet Take 1 tablet (75 mg total) by mouth daily. 90 tablet 3  . Cyanocobalamin (VITAMIN B 12 PO) Take by mouth  daily.    . rosuvastatin (CRESTOR) 10 MG tablet Take 1 tablet (10 mg total) by mouth daily. 90 tablet 3  . vitamin E 400 UNIT capsule Take 400 Units by mouth daily.     No current facility-administered medications for this visit.    Physical Exam BP 130/76 mmHg  Pulse 80  Resp 20  Ht '5\' 4"'$  (1.626 m)  Wt 152 lb (68.947 kg)  BMI 26.08 kg/m2  SpO54 40% 54 year old woman in no acute distress Well developed and well-nourished Alert and oriented x 3, with no focal deficits Cardiac regular rate and rhythm normal S1 and S2 Lungs slightly diminished breath sounds on left, otherwise clear Incisions well healed  Diagnostic Tests: Chest x-ray reviewed. It shows in good aeration of the lungs bilaterally  Impression:  54 year old woman with stage IB non-small cell carcinoma who is now 3 months post thoracoscopic left lower lobectomy. She has no evidence of recurrent disease.   Per her request she saw oncology here in [place]. She did not require adjuvant chemotherapy. She will need continued follow-up   Plan: She will  see Dr. Julien Nordmann in about 3 months for a CT scan.  I will plan to see her back in 9-10 months for her one-year follow-up  Melrose Nakayama, MD [facility] [phone]

## 2015-03-26 ENCOUNTER — Other Ambulatory Visit (HOSPITAL_BASED_OUTPATIENT_CLINIC_OR_DEPARTMENT_OTHER): Payer: 59

## 2015-03-26 ENCOUNTER — Ambulatory Visit (HOSPITAL_COMMUNITY)
Admission: RE | Admit: 2015-03-26 | Discharge: 2015-03-26 | Disposition: A | Discharge status: Home / Self Care | Payer: 59 | Source: Ambulatory Visit | Attending: Internal Medicine | Admitting: Internal Medicine

## 2015-03-26 ENCOUNTER — Encounter (HOSPITAL_COMMUNITY): Payer: Self-pay

## 2015-03-26 DIAGNOSIS — J90 Pleural effusion, not elsewhere classified: Secondary | ICD-10-CM | POA: Insufficient documentation

## 2015-03-26 DIAGNOSIS — I700 Atherosclerosis of aorta: Secondary | ICD-10-CM | POA: Insufficient documentation

## 2015-03-26 DIAGNOSIS — R590 Localized enlarged lymph nodes: Secondary | ICD-10-CM | POA: Diagnosis not present

## 2015-03-26 DIAGNOSIS — C3432 Malignant neoplasm of lower lobe, left bronchus or lung: Secondary | ICD-10-CM | POA: Insufficient documentation

## 2015-03-26 DIAGNOSIS — J439 Emphysema, unspecified: Secondary | ICD-10-CM | POA: Diagnosis not present

## 2015-03-26 DIAGNOSIS — Z08 Encounter for follow-up examination after completed treatment for malignant neoplasm: Secondary | ICD-10-CM | POA: Insufficient documentation

## 2015-03-26 LAB — COMPREHENSIVE METABOLIC PANEL (CC13)
ALBUMIN: 3.7 g/dL (ref 3.5–5.0)
ALK PHOS: 90 U/L (ref 40–150)
ALT: 17 U/L (ref 0–55)
ANION GAP: 9 meq/L (ref 3–11)
AST: 24 U/L (ref 5–34)
BUN: 14.6 mg/dL (ref 7.0–26.0)
CALCIUM: 9.3 mg/dL (ref 8.4–10.4)
CO2: 23 mEq/L (ref 22–29)
CREATININE: 0.8 mg/dL (ref 0.6–1.1)
Chloride: 107 mEq/L (ref 98–109)
EGFR: 81 mL/min/{1.73_m2} — ABNORMAL LOW (ref >90)
Glucose: 88 mg/dl (ref 70–140)
POTASSIUM: 4.2 meq/L (ref 3.5–5.1)
Sodium: 139 mEq/L (ref 136–145)
Total Bilirubin: 0.33 mg/dL (ref 0.20–1.20)
Total Protein: 7.3 g/dL (ref 6.4–8.3)

## 2015-03-26 LAB — CBC WITH DIFFERENTIAL/PLATELET
BASO%: 0.4 % (ref 0.0–2.0)
Basophils Absolute: 0 10*3/uL (ref 0.0–0.1)
EOS%: 1.5 % (ref 0.0–7.0)
Eosinophils Absolute: 0.1 10*3/uL (ref 0.0–0.5)
HCT: 41.8 % (ref 34.8–46.6)
HGB: 13.9 g/dL (ref 11.6–15.9)
LYMPH%: 31.7 % (ref 14.0–49.7)
MCH: 31.9 pg (ref 25.1–34.0)
MCHC: 33.3 g/dL (ref 31.5–36.0)
MCV: 95.9 fL (ref 79.5–101.0)
MONO#: 0.7 10*3/uL (ref 0.1–0.9)
MONO%: 8.4 % (ref 0.0–14.0)
NEUT%: 58 % (ref 38.4–76.8)
NEUTROS ABS: 4.9 10*3/uL (ref 1.5–6.5)
Platelets: 194 10*3/uL (ref 145–400)
RBC: 4.36 10*6/uL (ref 3.70–5.45)
RDW: 14 % (ref 11.2–14.5)
WBC: 8.5 10*3/uL (ref 3.9–10.3)
lymph#: 2.7 10*3/uL (ref 0.9–3.3)

## 2015-03-26 MED ORDER — IOHEXOL 300 MG/ML  SOLN
75.0000 mL | Freq: Once | INTRAMUSCULAR | Status: AC | PRN
  Administered 2015-03-26: 75 mL via INTRAVENOUS

## 2015-03-30 ENCOUNTER — Ambulatory Visit (HOSPITAL_BASED_OUTPATIENT_CLINIC_OR_DEPARTMENT_OTHER): Payer: 59 | Admitting: Internal Medicine

## 2015-03-30 ENCOUNTER — Encounter: Payer: Self-pay | Admitting: Internal Medicine

## 2015-03-30 VITALS — BP 115/75 | HR 68 | Temp 97.9°F | Resp 20 | Ht 64.0 in | Wt 149.7 lb

## 2015-03-30 DIAGNOSIS — R911 Solitary pulmonary nodule: Secondary | ICD-10-CM

## 2015-03-30 DIAGNOSIS — R079 Chest pain, unspecified: Secondary | ICD-10-CM

## 2015-03-30 DIAGNOSIS — C3432 Malignant neoplasm of lower lobe, left bronchus or lung: Secondary | ICD-10-CM

## 2015-03-30 DIAGNOSIS — Z720 Tobacco use: Secondary | ICD-10-CM

## 2015-03-30 DIAGNOSIS — J90 Pleural effusion, not elsewhere classified: Secondary | ICD-10-CM

## 2015-03-30 NOTE — Progress Notes (Signed)
[facility] [phone]   [phone]  OFFICE PROGRESS NOTE  [name], NP [address] [place] [address]  DIAGNOSIS: Stage IB (T2a, N0, M0) non-small cell lung cancer, adenocarcinoma diagnosed in March 2016.  PRIOR THERAPY: Status post left video-assisted thoracoscopy, wedge resection left lower lobe, thoracoscopic left lower lobectomy and mediastinal lymph node dissection on 08/14/2014.   CURRENT THERAPY: Observation.  INTERVAL HISTORY: Sharon [name] 54 y.o. female returns to the clinic today for six-month follow-up visit. The patient is feeling fine today except for pain on the left side of her chest with mild shortness breath. She continues to smoke one pack every week. The patient denied having any significant cough or hemoptysis. She denied having any significant weight loss or night sweats. She has no headache or visual changes. She has no nausea or vomiting, no fever or chills. He has been observation for the last 6 months and the patient presented today with repeat CT scan of the chest for restaging of her disease.  MEDICAL HISTORY: Past Medical History  Diagnosis Date  . Ischemic cardiomyopathy   . Hyperlipidemia   . Tobacco user   . Coronary artery disease   . Occlusion of right coronary artery ([place])   . Aortic insufficiency     moderate by echo in 2009  . Acute ST segment elevation MI ([place])   . Neoplasm of uncertain behavior of left lower lobe of lung     per CT CHEST/PET 2/4 and 07/18/14 @ [facility]  . S/P coronary artery stent placement     2009  . COPD (chronic obstructive pulmonary disease) ([place])   . Emphysema of lung ([place])   . Pneumonia     2015   HX BRONCHITIS    . Shortness of breath dyspnea     WITH EXEERTION   . Arthritis   . Cancer ([place])     LUNGS    ALLERGIES:  is allergic to codeine; lipitor; morphine and related; and oxycodone-acetaminophen.  MEDICATIONS:  Current Outpatient Prescriptions    Medication Sig Dispense Refill  . aspirin (ASPIR-LOW) 81 MG EC tablet Take 81 mg by mouth daily. On hold    . buPROPion (WELLBUTRIN SR) 150 MG 12 hr tablet Take 150 mg by mouth 2 (two) times daily.   0  . Cholecalciferol (VITAMIN D-3) 1000 UNITS CAPS Take 1 capsule by mouth daily.    . clopidogrel (PLAVIX) 75 MG tablet Take 1 tablet (75 mg total) by mouth daily. 90 tablet 3  . Cyanocobalamin (VITAMIN B 12 PO) Take by mouth daily.    . rosuvastatin (CRESTOR) 10 MG tablet Take 1 tablet (10 mg total) by mouth daily. 90 tablet 3  . vitamin E 400 UNIT capsule Take 400 Units by mouth daily.     No current facility-administered medications for this visit.    SURGICAL HISTORY:  Past Surgical History  Procedure Laterality Date  . Cholecystectomy    . Tubal ligation    . Ectopic pregnancies requiring laparotomies    . Cardiac catheterization  10/15/2007    EF 30-40%  . Coronary angioplasty with stent placement      LAD  . US echocardiography  01/07/2008    EF 55-60%  . Transthoracic echocardiogram  10/17/2007  . Cesarean section    . Shoulder surgery      LEFT X 2   (REMOVED SOME COLLAR BONE )  . Video assisted thoracoscopy (vats)/wedge resection Left 08/14/2014  Procedure: VIDEO ASSISTED THORACOSCOPY (VATS)/WEDGE RESECTION;  Surgeon: Melrose Nakayama, MD;  Location: [facility];  Service: Thoracic;  Laterality: Left;  . Lobectomy Left 08/14/2014    Procedure: LEFT LOWER LOBECTOMY;  Surgeon: Melrose Nakayama, MD;  Location: [facility];  Service: Thoracic;  Laterality: Left;  . Lead removal Left 08/14/2014    Procedure: CRYO INTERCOSTAL NERVE BLOCK;  Surgeon: Melrose Nakayama, MD;  Location: [facility];  Service: Thoracic;  Laterality: Left;  . Node dissection Left 08/14/2014    Procedure: NODE DISSECTION, LEFT LOWER LOBE LUNG;  Surgeon: Melrose Nakayama, MD;  Location: [facility];  Service: Thoracic;  Laterality: Left;    REVIEW OF SYSTEMS:  A comprehensive review of systems was negative except  for: Respiratory: positive for dyspnea on exertion and pleurisy/chest pain   PHYSICAL EXAMINATION: General appearance: alert, cooperative and no distress Head: Normocephalic, without obvious abnormality, atraumatic Neck: no adenopathy, no JVD, supple, symmetrical, trachea midline and thyroid not enlarged, symmetric, no tenderness/mass/nodules Lymph nodes: Cervical, supraclavicular, and axillary nodes normal. Resp: clear to auscultation bilaterally Back: symmetric, no curvature. ROM normal. No CVA tenderness. Cardio: regular rate and rhythm, S1, S2 normal, no murmur, click, rub or gallop GI: soft, non-tender; bowel sounds normal; no masses,  no organomegaly Extremities: extremities normal, atraumatic, no cyanosis or edema  ECOG PERFORMANCE STATUS: 1 - Symptomatic but completely ambulatory  There were no vitals taken for this visit.  LABORATORY DATA: Lab Results  Component Value Date   WBC 8.5 03/26/2015   HGB 13.9 03/26/2015   HCT 41.8 03/26/2015   MCV 95.9 03/26/2015   PLT 194 03/26/2015      Chemistry      Component Value Date/Time   NA 139 03/26/2015 0750   NA 138 08/16/2014 0520   K 4.2 03/26/2015 0750   K 3.9 08/16/2014 0520   CL 109 08/16/2014 0520   CO2 23 03/26/2015 0750   CO2 25 08/16/2014 0520   BUN 14.6 03/26/2015 0750   BUN 8 08/16/2014 0520   CREATININE 0.8 03/26/2015 0750   CREATININE 0.59 08/16/2014 0520      Component Value Date/Time   CALCIUM 9.3 03/26/2015 0750   CALCIUM 8.1* 08/16/2014 0520   ALKPHOS 90 03/26/2015 0750   ALKPHOS 53 08/16/2014 0520   AST 24 03/26/2015 0750   AST 27 08/16/2014 0520   ALT 17 03/26/2015 0750   ALT 20 08/16/2014 0520   BILITOT 0.33 03/26/2015 0750   BILITOT 0.7 08/16/2014 0520       RADIOGRAPHIC STUDIES: Ct Chest W Contrast  03/26/2015  CLINICAL DATA:  Left lung cancer.  Partial resection EXAM: CT CHEST WITH CONTRAST TECHNIQUE: Multidetector CT imaging of the chest was performed during intravenous contrast  administration. CONTRAST:  7m OMNIPAQUE IOHEXOL 300 MG/ML  SOLN COMPARISON:  07/23/2014 FINDINGS: Mediastinum: The heart size is normal. Aortic atherosclerosis noted. No pericardial effusion. Stable 11 mm right paratracheal lymph node, image 14 of series 2. Pre vascular lymph node on the left measures 1.3 cm, image 22/series 2. Previously 0.9 cm. The previous 1.1 cm left hilar lymph node has resolved in the interval. No axillary or supraclavicular adenopathy. Lungs/Pleura: Trace pleural fluid is identified within the posterior left lung base. New from previous exam. There are multiple suspicious pleural base nodules overlying the left lung, worrisome for metastasis. Index nodule posteriorly measures 1 cm, image 20/series 2. Also new from previous exam is a posterior pleural nodule measuring 1.3 cm, image 26/series 2. Mild changes of centrilobular  emphysema. There has been surgical resection of previous left lower lobe spiculated nodule. In the posterior left lung zone there is a nodule which measures 2.5 by 1.1 cm, image 45 of series 5. Upper Abdomen: No suspicious liver abnormality. The visualized portions of the spleen are normal. The adrenal glands are unremarkable. Unremarkable appearance of the pancreas. Musculoskeletal: No aggressive lytic or sclerotic bone lesions. IMPRESSION: 1. No acute cardiopulmonary abnormalities. 2. New small left pleural effusion and new pleural base nodules overlying the left lung worrisome for trans pleural spread of tumor. 3. Increase in size of left-sided prevascular lymph node worrisome for new metastatic adenopathy. 4. Emphysema 5. Aortic atherosclerosis Electronically Signed   By: Kerby Moors M.D.   On: 03/26/2015 10:56    ASSESSMENT AND PLAN: This is a very pleasant 54 years old white female with history of stage I B non-small cell lung cancer status post left lower lobectomy with mediastinal lymph node dissection with evidence of pleural invasion and has been on  observation for the last 6 months. She complains today of left sided chest pain and the CT scan of the chest showed new small left pleural effusion as well as new pleural based nodules worrisome for transpleural spread of tumor concerning for disease recurrence. I discussed the scan results with the patient today. I recommended for her to have a PET scan for further evaluation of her disease. I will see the patient back for follow-up visit in 2 weeks for reevaluation and discussion of further treatment options based on the PET scan results. The patient was advised to call immediately if she has any concerning symptoms in the interval. The patient voices understanding of current disease status and treatment options and is in agreement with the current care plan.  All questions were answered. The patient knows to call the clinic with any problems, questions or concerns. We can certainly see the patient much sooner if necessary.  Disclaimer: This note was dictated with voice recognition software. Similar sounding words can inadvertently be transcribed and may not be corrected upon review.

## 2015-03-30 NOTE — Patient Instructions (Signed)
Smoking Cessation, Tips for Success If you are ready to quit smoking, congratulations! You have chosen to help yourself be healthier. Cigarettes bring nicotine, tar, carbon monoxide, and other irritants into your body. Your lungs, heart, and blood vessels will be able to work better without these poisons. There are many different ways to quit smoking. Nicotine gum, nicotine patches, a nicotine inhaler, or nicotine nasal spray can help with physical craving. Hypnosis, support groups, and medicines help break the habit of smoking. WHAT THINGS CAN I DO TO MAKE QUITTING EASIER?  Here are some tips to help you quit for good:  Pick a date when you will quit smoking completely. Tell all of your friends and family about your plan to quit on that date.  Do not try to slowly cut down on the number of cigarettes you are smoking. Pick a quit date and quit smoking completely starting on that day.  Throw away all cigarettes.   Clean and remove all ashtrays from your home, work, and car.  On a card, write down your reasons for quitting. Carry the card with you and read it when you get the urge to smoke.  Cleanse your body of nicotine. Drink enough water and fluids to keep your urine clear or pale yellow. Do this after quitting to flush the nicotine from your body.  Learn to predict your moods. Do not let a bad situation be your excuse to have a cigarette. Some situations in your life might tempt you into wanting a cigarette.  Never have "just one" cigarette. It leads to wanting another and another. Remind yourself of your decision to quit.  Change habits associated with smoking. If you smoked while driving or when feeling stressed, try other activities to replace smoking. Stand up when drinking your coffee. Brush your teeth after eating. Sit in a different chair when you read the paper. Avoid alcohol while trying to quit, and try to drink fewer caffeinated beverages. Alcohol and caffeine may urge you to  smoke.  Avoid foods and drinks that can trigger a desire to smoke, such as sugary or spicy foods and alcohol.  Ask people who smoke not to smoke around you.  Have something planned to do right after eating or having a cup of coffee. For example, plan to take a walk or exercise.  Try a relaxation exercise to calm you down and decrease your stress. Remember, you may be tense and nervous for the first 2 weeks after you quit, but this will pass.  Find new activities to keep your hands busy. Play with a pen, coin, or rubber band. Doodle or draw things on paper.  Brush your teeth right after eating. This will help cut down on the craving for the taste of tobacco after meals. You can also try mouthwash.   Use oral substitutes in place of cigarettes. Try using lemon drops, carrots, cinnamon sticks, or chewing gum. Keep them handy so they are available when you have the urge to smoke.  When you have the urge to smoke, try deep breathing.  Designate your home as a nonsmoking area.  If you are a heavy smoker, ask your health care provider about a prescription for nicotine chewing gum. It can ease your withdrawal from nicotine.  Reward yourself. Set aside the cigarette money you save and buy yourself something nice.  Look for support from others. Join a support group or smoking cessation program. Ask someone at home or at work to help you with your plan   to quit smoking.  Always ask yourself, "Do I need this cigarette or is this just a reflex?" Tell yourself, "Today, I choose not to smoke," or "I do not want to smoke." You are reminding yourself of your decision to quit.  Do not replace cigarette smoking with electronic cigarettes (commonly called e-cigarettes). The safety of e-cigarettes is unknown, and some may contain harmful chemicals.  If you relapse, do not give up! Plan ahead and think about what you will do the next time you get the urge to smoke. HOW WILL I FEEL WHEN I QUIT SMOKING? You  may have symptoms of withdrawal because your body is used to nicotine (the addictive substance in cigarettes). You may crave cigarettes, be irritable, feel very hungry, cough often, get headaches, or have difficulty concentrating. The withdrawal symptoms are only temporary. They are strongest when you first quit but will go away within 10-14 days. When withdrawal symptoms occur, stay in control. Think about your reasons for quitting. Remind yourself that these are signs that your body is healing and getting used to being without cigarettes. Remember that withdrawal symptoms are easier to treat than the major diseases that smoking can cause.  Even after the withdrawal is over, expect periodic urges to smoke. However, these cravings are generally short lived and will go away whether you smoke or not. Do not smoke! WHAT RESOURCES ARE AVAILABLE TO HELP ME QUIT SMOKING? Your health care provider can direct you to community resources or hospitals for support, which may include:  Group support.  Education.  Hypnosis.  Therapy.   This information is not intended to replace advice given to you by your health care provider. Make sure you discuss any questions you have with your health care provider.   Document Released: 02/12/2004 Document Revised: 06/06/2014 Document Reviewed: 11/01/2012 Elsevier Interactive Patient Education 2016 [employer]  

## 2015-03-31 ENCOUNTER — Telehealth: Payer: Self-pay | Admitting: Internal Medicine

## 2015-03-31 NOTE — Telephone Encounter (Signed)
s.w. pt and advised on NOV appt....pt ok and aware °

## 2015-04-03 ENCOUNTER — Ambulatory Visit (HOSPITAL_COMMUNITY)
Admission: RE | Admit: 2015-04-03 | Discharge: 2015-04-03 | Disposition: A | Discharge status: Home / Self Care | Payer: 59 | Source: Ambulatory Visit | Attending: Internal Medicine | Admitting: Internal Medicine

## 2015-04-03 DIAGNOSIS — R918 Other nonspecific abnormal finding of lung field: Secondary | ICD-10-CM | POA: Diagnosis not present

## 2015-04-03 DIAGNOSIS — R590 Localized enlarged lymph nodes: Secondary | ICD-10-CM | POA: Diagnosis not present

## 2015-04-03 DIAGNOSIS — C3432 Malignant neoplasm of lower lobe, left bronchus or lung: Secondary | ICD-10-CM

## 2015-04-03 LAB — GLUCOSE, CAPILLARY: GLUCOSE-CAPILLARY: 89 mg/dL (ref 65–99)

## 2015-04-03 MED ORDER — FLUDEOXYGLUCOSE F - 18 (FDG) INJECTION: 7.3000 | Freq: Once | INTRAVENOUS | Status: DC | PRN

## 2015-04-21 ENCOUNTER — Ambulatory Visit (HOSPITAL_BASED_OUTPATIENT_CLINIC_OR_DEPARTMENT_OTHER): Payer: 59 | Admitting: Internal Medicine

## 2015-04-21 ENCOUNTER — Encounter: Payer: Self-pay | Admitting: Internal Medicine

## 2015-04-21 VITALS — BP 145/56 | HR 66 | Temp 98.3°F | Resp 20 | Ht 64.0 in | Wt 146.8 lb

## 2015-04-21 DIAGNOSIS — C3432 Malignant neoplasm of lower lobe, left bronchus or lung: Secondary | ICD-10-CM | POA: Diagnosis not present

## 2015-04-21 NOTE — Progress Notes (Signed)
[place] [phone]   [phone]  OFFICE PROGRESS NOTE  [name], NP [address] [place] [address]  DIAGNOSIS: Stage IB (T2a, N0, M0) non-small cell lung cancer, adenocarcinoma diagnosed in March 2016.  PRIOR THERAPY: Status post left video-assisted thoracoscopy, wedge resection left lower lobe, thoracoscopic left lower lobectomy and mediastinal lymph node dissection on 08/14/2014.   CURRENT THERAPY: Observation.  INTERVAL HISTORY: Sharon [name] 54 y.o. female returns to the clinic today for follow-up visit accompanied by her daughter and mother. The patient is feeling fine today except for pain on the left side of her chest with mild shortness breath. The patient denied having any significant cough or hemoptysis. She denied having any significant weight loss or night sweats. She has no headache or visual changes. She has no nausea or vomiting, no fever or chills. She was found recently to have suspicious disease recurrence on the left side of the chest. The patient underwent a PET scan for further evaluation of her disease and she is here for discussion of the scan results and recommendation regarding treatment of her condition.  MEDICAL HISTORY: Past Medical History  Diagnosis Date  . Ischemic cardiomyopathy   . Hyperlipidemia   . Tobacco user   . Coronary artery disease   . Occlusion of right coronary artery ([place])   . Aortic insufficiency     moderate by echo in 2009  . Acute ST segment elevation MI ([place])   . Neoplasm of uncertain behavior of left lower lobe of lung     per CT CHEST/PET 2/4 and 07/18/14 @ [place]  . S/P coronary artery stent placement     2009  . COPD (chronic obstructive pulmonary disease) ([place])   . Emphysema of lung ([place])   . Pneumonia     2015   HX BRONCHITIS    . Shortness of breath dyspnea     WITH EXEERTION   . Arthritis   . Cancer ([place])     LUNGS    ALLERGIES:  is allergic to codeine;  lipitor; morphine and related; and oxycodone-acetaminophen.  MEDICATIONS:  Current Outpatient Prescriptions  Medication Sig Dispense Refill  . alendronate (FOSAMAX) 10 MG tablet Take 10 mg by mouth daily before breakfast. Take with a full glass of water on an empty stomach.    Marland Kitchen aspirin (ASPIR-LOW) 81 MG EC tablet Take 81 mg by mouth daily. On hold    . buPROPion (WELLBUTRIN SR) 150 MG 12 hr tablet Take 150 mg by mouth 2 (two) times daily.   0  . Cholecalciferol (VITAMIN D-3) 1000 UNITS CAPS Take 1 capsule by mouth daily.    . clopidogrel (PLAVIX) 75 MG tablet Take 1 tablet (75 mg total) by mouth daily. 90 tablet 3  . Cyanocobalamin (VITAMIN B 12 PO) Take by mouth daily.    . rosuvastatin (CRESTOR) 10 MG tablet Take 1 tablet (10 mg total) by mouth daily. 90 tablet 3  . vitamin E 400 UNIT capsule Take 400 Units by mouth daily.    . Vitamin D, Ergocalciferol, (DRISDOL) 50000 UNITS CAPS capsule TK 1 C PO ONCE A WEEK FOR 4 WEEKS  0   No current facility-administered medications for this visit.    SURGICAL HISTORY:  Past Surgical History  Procedure Laterality Date  . Cholecystectomy    . Tubal ligation    . Ectopic pregnancies requiring laparotomies    . Cardiac catheterization  10/15/2007    EF  30-40%  . Coronary angioplasty with stent placement      LAD  . US echocardiography  01/07/2008    EF 55-60%  . Transthoracic echocardiogram  10/17/2007  . Cesarean section    . Shoulder surgery      LEFT X 2   (REMOVED SOME COLLAR BONE )  . Video assisted thoracoscopy (vats)/wedge resection Left 08/14/2014    Procedure: VIDEO ASSISTED THORACOSCOPY (VATS)/WEDGE RESECTION;  Surgeon: Melrose Nakayama, MD;  Location: [facility];  Service: Thoracic;  Laterality: Left;  . Lobectomy Left 08/14/2014    Procedure: LEFT LOWER LOBECTOMY;  Surgeon: Melrose Nakayama, MD;  Location: [facility];  Service: Thoracic;  Laterality: Left;  . Lead removal Left 08/14/2014    Procedure: CRYO INTERCOSTAL NERVE BLOCK;   Surgeon: Melrose Nakayama, MD;  Location: [facility];  Service: Thoracic;  Laterality: Left;  . Node dissection Left 08/14/2014    Procedure: NODE DISSECTION, LEFT LOWER LOBE LUNG;  Surgeon: Melrose Nakayama, MD;  Location: [facility];  Service: Thoracic;  Laterality: Left;    REVIEW OF SYSTEMS:  Constitutional: positive for fatigue Eyes: negative Ears, nose, mouth, throat, and face: negative Respiratory: positive for dyspnea on exertion and pleurisy/chest pain Cardiovascular: negative Gastrointestinal: negative Genitourinary:negative Integument/breast: negative Hematologic/lymphatic: negative Musculoskeletal:negative Neurological: negative Behavioral/Psych: negative Endocrine: negative Allergic/Immunologic: negative   PHYSICAL EXAMINATION: General appearance: alert, cooperative and no distress Head: Normocephalic, without obvious abnormality, atraumatic Neck: no adenopathy, no JVD, supple, symmetrical, trachea midline and thyroid not enlarged, symmetric, no tenderness/mass/nodules Lymph nodes: Cervical, supraclavicular, and axillary nodes normal. Resp: clear to auscultation bilaterally Back: symmetric, no curvature. ROM normal. No CVA tenderness. Cardio: regular rate and rhythm, S1, S2 normal, no murmur, click, rub or gallop GI: soft, non-tender; bowel sounds normal; no masses,  no organomegaly Extremities: extremities normal, atraumatic, no cyanosis or edema Neurologic: Alert and oriented X 3, normal strength and tone. Normal symmetric reflexes. Normal coordination and gait  ECOG PERFORMANCE STATUS: 1 - Symptomatic but completely ambulatory  Blood pressure 145/56, pulse 66, temperature 98.3 F (36.8 C), temperature source Oral, resp. rate 20, height '5\' 4"'$  (1.626 m), weight 146 lb 12.8 oz (66.588 kg), SpO2 100 %.  LABORATORY DATA: Lab Results  Component Value Date   WBC 8.5 03/26/2015   HGB 13.9 03/26/2015   HCT 41.8 03/26/2015   MCV 95.9 03/26/2015   PLT 194 03/26/2015       Chemistry      Component Value Date/Time   NA 139 03/26/2015 0750   NA 138 08/16/2014 0520   K 4.2 03/26/2015 0750   K 3.9 08/16/2014 0520   CL 109 08/16/2014 0520   CO2 23 03/26/2015 0750   CO2 25 08/16/2014 0520   BUN 14.6 03/26/2015 0750   BUN 8 08/16/2014 0520   CREATININE 0.8 03/26/2015 0750   CREATININE 0.59 08/16/2014 0520      Component Value Date/Time   CALCIUM 9.3 03/26/2015 0750   CALCIUM 8.1* 08/16/2014 0520   ALKPHOS 90 03/26/2015 0750   ALKPHOS 53 08/16/2014 0520   AST 24 03/26/2015 0750   AST 27 08/16/2014 0520   ALT 17 03/26/2015 0750   ALT 20 08/16/2014 0520   BILITOT 0.33 03/26/2015 0750   BILITOT 0.7 08/16/2014 0520       RADIOGRAPHIC STUDIES: Ct Chest W Contrast  03/26/2015  CLINICAL DATA:  Left lung cancer.  Partial resection EXAM: CT CHEST WITH CONTRAST TECHNIQUE: Multidetector CT imaging of the chest was performed during intravenous contrast administration.  CONTRAST:  34m OMNIPAQUE IOHEXOL 300 MG/ML  SOLN COMPARISON:  07/23/2014 FINDINGS: Mediastinum: The heart size is normal. Aortic atherosclerosis noted. No pericardial effusion. Stable 11 mm right paratracheal lymph node, image 14 of series 2. Pre vascular lymph node on the left measures 1.3 cm, image 22/series 2. Previously 0.9 cm. The previous 1.1 cm left hilar lymph node has resolved in the interval. No axillary or supraclavicular adenopathy. Lungs/Pleura: Trace pleural fluid is identified within the posterior left lung base. New from previous exam. There are multiple suspicious pleural base nodules overlying the left lung, worrisome for metastasis. Index nodule posteriorly measures 1 cm, image 20/series 2. Also new from previous exam is a posterior pleural nodule measuring 1.3 cm, image 26/series 2. Mild changes of centrilobular emphysema. There has been surgical resection of previous left lower lobe spiculated nodule. In the posterior left lung zone there is a nodule which measures 2.5 by 1.1  cm, image 45 of series 5. Upper Abdomen: No suspicious liver abnormality. The visualized portions of the spleen are normal. The adrenal glands are unremarkable. Unremarkable appearance of the pancreas. Musculoskeletal: No aggressive lytic or sclerotic bone lesions. IMPRESSION: 1. No acute cardiopulmonary abnormalities. 2. New small left pleural effusion and new pleural base nodules overlying the left lung worrisome for trans pleural spread of tumor. 3. Increase in size of left-sided prevascular lymph node worrisome for new metastatic adenopathy. 4. Emphysema 5. Aortic atherosclerosis Electronically Signed   By: TKerby MoorsM.D.   On: 03/26/2015 10:56   Nm Pet Image Initial (pi) Skull Base To Thigh  04/03/2015  CLINICAL DATA:  Subsequent treatment strategy for left lung carcinoma. New left lung nodules on recent CT. EXAM: NUCLEAR MEDICINE PET SKULL BASE TO THIGH TECHNIQUE: 7.3 mCi F-18 FDG was injected intravenously. Full-ring PET imaging was performed from the skull base to thigh after the radiotracer. CT data was obtained and used for attenuation correction and anatomic localization. FASTING BLOOD GLUCOSE:  Value: 89 mg/dl COMPARISON:  Chest CT on 03/26/15 and PET-CT on 07/22/2014 FINDINGS: NECK No hypermetabolic lymph nodes in the neck. CHEST 11 mm mediastinal lymph node in the left lateral aortic region on image 68/series 4 shows new hypermetabolic activity with SUV max of 3.8. New 13 mm mediastinal lymph node adjacent to the GE junction is seen on image 93/series 4 which is also hypermetabolic, with SUV max of 10.7. A new less than 1 cm hypermetabolic lymph node is seen in the left anterior cardiophrenic angle on image 102/series 4, with SUV max of 23.6. Numerous hypermetabolic soft tissue nodules are seen throughout the left pleural space which are new, and new tiny left pleural effusion also noted. Emphysema again noted, but no suspicious pulmonary nodules seen by CT. ABDOMEN/PELVIS No abnormal  hypermetabolic activity within the liver, pancreas, adrenal glands, or spleen. No hypermetabolic lymph nodes in the abdomen or pelvis. SKELETON No focal hypermetabolic activity to suggest skeletal metastasis. IMPRESSION: Numerous new small hypermetabolic soft tissue nodules throughout the left pleural space and tiny left pleural effusion, consistent with diffuse pleural metastatic disease. Mild hypermetabolic mediastinal lymphadenopathy in the left lateral aortic region, GE junction, and left anterior cardiophrenic angle, consistent with metastatic disease. No evidence of metastatic disease within the neck, abdomen, or pelvis. Electronically Signed   By: JEarle GellM.D.   On: 04/03/2015 10:21    ASSESSMENT AND PLAN: This is a very pleasant 54years old white female with history of stage I B non-small cell lung cancer status post left lower  lobectomy with mediastinal lymph node dissection with evidence of pleural invasion and has been on observation for the last 6 months. Unfortunately the patient has evidence for disease recurrence and the recent CT scan of the chest showed new small left pleural effusion as well as new pleural based nodules worrisome for transpleural spread of tumor concerning for disease recurrence. The PET scan performed recently shows also numerous new small hypermetabolic soft tissue nodules throughout the left pleural space and small tiny left pleural effusion as well as hypermetabolic mediastinal lymphadenopathy. I discussed the scan results and showed the images to the patient and her family. I recommended for her to have CT-guided core biopsy of one of the left lung pleural based nodules for confirmation of tissue diagnosis. I will also arrange for the patient to have MRI of the brain to rule out brain metastasis. I requested her tissue block to be tested for molecular mutations in addition to PDL 1 expression. I will see the patient back for follow-up visit in 2 weeks for  reevaluation and discussion of her treatment options based on the final staging workup and molecular studies. The patient was advised to call immediately if she has any concerning symptoms in the interval. The patient voices understanding of current disease status and treatment options and is in agreement with the current care plan.  All questions were answered. The patient knows to call the clinic with any problems, questions or concerns. We can certainly see the patient much sooner if necessary.  Disclaimer: This note was dictated with voice recognition software. Similar sounding words can inadvertently be transcribed and may not be corrected upon review.

## 2015-04-22 ENCOUNTER — Other Ambulatory Visit (HOSPITAL_COMMUNITY)
Admission: RE | Admit: 2015-04-22 | Discharge: 2015-04-22 | Disposition: A | Discharge status: Home / Self Care | Payer: 59 | Source: Ambulatory Visit | Attending: Internal Medicine | Admitting: Internal Medicine

## 2015-04-22 DIAGNOSIS — C3432 Malignant neoplasm of lower lobe, left bronchus or lung: Secondary | ICD-10-CM | POA: Diagnosis not present

## 2015-04-24 ENCOUNTER — Encounter: Payer: Self-pay | Admitting: Internal Medicine

## 2015-04-24 NOTE — Progress Notes (Signed)
Put daughter's fmla form on nurse's desk. °

## 2015-04-27 ENCOUNTER — Encounter (HOSPITAL_COMMUNITY): Payer: Self-pay

## 2015-04-27 ENCOUNTER — Encounter: Payer: Self-pay | Admitting: Internal Medicine

## 2015-04-27 NOTE — Progress Notes (Signed)
[place] forms to [phone]. Forward copy to medical records and front lobby.

## 2015-04-29 ENCOUNTER — Other Ambulatory Visit: Payer: Self-pay | Admitting: Radiology

## 2015-04-30 ENCOUNTER — Ambulatory Visit (HOSPITAL_COMMUNITY)
Admission: RE | Admit: 2015-04-30 | Discharge: 2015-04-30 | Disposition: A | Discharge status: Home / Self Care | Payer: 59 | Source: Ambulatory Visit | Attending: Internal Medicine | Admitting: Internal Medicine

## 2015-04-30 ENCOUNTER — Encounter (HOSPITAL_COMMUNITY): Payer: Self-pay

## 2015-04-30 ENCOUNTER — Ambulatory Visit (HOSPITAL_COMMUNITY)
Admission: RE | Admit: 2015-04-30 | Discharge: 2015-04-30 | Disposition: A | Discharge status: Home / Self Care | Payer: 59 | Source: Ambulatory Visit | Attending: Interventional Radiology | Admitting: Interventional Radiology

## 2015-04-30 DIAGNOSIS — C3432 Malignant neoplasm of lower lobe, left bronchus or lung: Secondary | ICD-10-CM | POA: Diagnosis not present

## 2015-04-30 DIAGNOSIS — R918 Other nonspecific abnormal finding of lung field: Secondary | ICD-10-CM | POA: Diagnosis not present

## 2015-04-30 DIAGNOSIS — R911 Solitary pulmonary nodule: Secondary | ICD-10-CM | POA: Insufficient documentation

## 2015-04-30 DIAGNOSIS — Z9889 Other specified postprocedural states: Secondary | ICD-10-CM

## 2015-04-30 LAB — CBC
HCT: 42.7 % (ref 36.0–46.0)
Hemoglobin: 14.3 g/dL (ref 12.0–15.0)
MCH: 32.2 pg (ref 26.0–34.0)
MCHC: 33.5 g/dL (ref 30.0–36.0)
MCV: 96.2 fL (ref 78.0–100.0)
Platelets: 236 10*3/uL (ref 150–400)
RBC: 4.44 MIL/uL (ref 3.87–5.11)
RDW: 14.1 % (ref 11.5–15.5)
WBC: 7.7 10*3/uL (ref 4.0–10.5)

## 2015-04-30 LAB — PROTIME-INR
INR: 1 (ref 0.00–1.49)
PROTHROMBIN TIME: 13.4 s (ref 11.6–15.2)

## 2015-04-30 LAB — APTT: aPTT: 31 seconds (ref 24–37)

## 2015-04-30 MED ORDER — FENTANYL CITRATE (PF) 100 MCG/2ML IJ SOLN
INTRAMUSCULAR | Status: AC
  Filled 2015-04-30: qty 2

## 2015-04-30 MED ORDER — MIDAZOLAM HCL 2 MG/2ML IJ SOLN
INTRAMUSCULAR | Status: AC | PRN
  Administered 2015-04-30 (×2): 1 mg via INTRAVENOUS

## 2015-04-30 MED ORDER — MIDAZOLAM HCL 2 MG/2ML IJ SOLN
INTRAMUSCULAR | Status: AC
  Filled 2015-04-30: qty 2

## 2015-04-30 MED ORDER — ONDANSETRON HCL 4 MG/2ML IJ SOLN
4.0000 mg | Freq: Once | INTRAMUSCULAR | Status: DC
  Filled 2015-04-30: qty 2

## 2015-04-30 MED ORDER — FENTANYL CITRATE (PF) 100 MCG/2ML IJ SOLN
INTRAMUSCULAR | Status: AC | PRN
  Administered 2015-04-30: 50 ug via INTRAVENOUS
  Administered 2015-04-30 (×2): 25 ug via INTRAVENOUS
  Administered 2015-04-30: 50 ug via INTRAVENOUS

## 2015-04-30 MED ORDER — SODIUM CHLORIDE 0.9 % IV SOLN: INTRAVENOUS | Status: DC

## 2015-04-30 MED ORDER — LIDOCAINE HCL 1 % IJ SOLN
INTRAMUSCULAR | Status: AC
  Filled 2015-04-30: qty 20

## 2015-04-30 NOTE — Procedures (Signed)
Interventional Radiology Procedure Note  Procedure: CT guided biopsy of left pleural based nodule.  Complications: Large intraprocedural PTX treated by aspiration and application of a pleural blood patch.    Recommendations:  - Bedrest until CXR cleared.  Minimize talking, coughing or otherwise straining.   - Follow up 2 hr CXR pending   Signed,  Criselda Peaches, MD

## 2015-04-30 NOTE — Sedation Documentation (Signed)
pneumo asspirated. Waiting to check another scan

## 2015-04-30 NOTE — Discharge Instructions (Signed)

## 2015-04-30 NOTE — Sedation Documentation (Addendum)
Pt.continues to move on CT table. VSS Pt awake. Additional medication given, per MD request,  to help relax pt and acquire a clear scan for lung bx.

## 2015-04-30 NOTE — H&P (Signed)
Chief Complaint: Patient was seen in consultation today for left lung nodule biopsy at the request of [facility]  Referring Physician(s): Mohamed,Mohamed  History of Present Illness: Sharon [name] is a 54 y.o. female with newly found left lung pleural based nodules. She underwent PET scan showing these areas to be hypermetabolic, She is scheduled for CT bx. PMHx, meds, labs, imaging, allergies reviewed. She has stopped her ASA and Plavix for the past 5 days. Has been NPO this am  Past Medical History  Diagnosis Date  . Ischemic cardiomyopathy   . Hyperlipidemia   . Tobacco user   . Coronary artery disease   . Occlusion of right coronary artery ([place])   . Aortic insufficiency     moderate by echo in 2009  . Acute ST segment elevation MI ([place])   . Neoplasm of uncertain behavior of left lower lobe of lung     per CT CHEST/PET 2/4 and 07/18/14 @ [facility]  . S/P coronary artery stent placement     2009  . COPD (chronic obstructive pulmonary disease) ([place])   . Emphysema of lung ([place])   . Pneumonia     2015   HX BRONCHITIS    . Shortness of breath dyspnea     WITH EXEERTION   . Arthritis   . Cancer [facility])     LUNGS    Past Surgical History  Procedure Laterality Date  . Cholecystectomy    . Tubal ligation    . Ectopic pregnancies requiring laparotomies    . Cardiac catheterization  10/15/2007    EF 30-40%  . Coronary angioplasty with stent placement      LAD  . US echocardiography  01/07/2008    EF 55-60%  . Transthoracic echocardiogram  10/17/2007  . Cesarean section    . Shoulder surgery      LEFT X 2   (REMOVED SOME COLLAR BONE )  . Video assisted thoracoscopy (vats)/wedge resection Left 08/14/2014    Procedure: VIDEO ASSISTED THORACOSCOPY (VATS)/WEDGE RESECTION;  Surgeon: Melrose Nakayama, MD;  Location: [facility];  Service: Thoracic;  Laterality: Left;  . Lobectomy Left 08/14/2014    Procedure: LEFT LOWER LOBECTOMY;  Surgeon: Melrose Nakayama, MD;   Location: [facility];  Service: Thoracic;  Laterality: Left;  . Lead removal Left 08/14/2014    Procedure: CRYO INTERCOSTAL NERVE BLOCK;  Surgeon: Melrose Nakayama, MD;  Location: [place];  Service: Thoracic;  Laterality: Left;  . Node dissection Left 08/14/2014    Procedure: NODE DISSECTION, LEFT LOWER LOBE LUNG;  Surgeon: Melrose Nakayama, MD;  Location: [facility];  Service: Thoracic;  Laterality: Left;    Allergies: Codeine; Lipitor; Morphine and related; and Oxycodone-acetaminophen  Medications: Prior to Admission medications   Medication Sig Start Date End Date Taking? Authorizing Provider  alendronate (FOSAMAX) 10 MG tablet Take 10 mg by mouth once a week. Take with a full glass of water on an empty stomach.   Yes Historical Provider, MD  ALPRAZolam (XANAX) 0.25 MG tablet Take 0.25 mg by mouth 2 (two) times daily as needed for anxiety.   Yes Historical Provider, MD  aspirin (ASPIR-LOW) 81 MG EC tablet Take 81 mg by mouth daily. On hold 01/04/14  Yes Historical Provider, MD  clopidogrel (PLAVIX) 75 MG tablet Take 1 tablet (75 mg total) by mouth daily. 01/23/14  Yes Thayer Headings, MD  rosuvastatin (CRESTOR) 10 MG tablet Take 1 tablet (10 mg total) by mouth daily. 07/25/14  Yes Arnette Norris  Deboraha Sprang, MD  Vitamin D, Ergocalciferol, (DRISDOL) 50000 UNITS CAPS capsule Take 50,000 Units by mouth every 7 (seven) days.   Yes Historical Provider, MD     Family History  Problem Relation Age of Onset  . Diabetes Mother   . Hypertension Mother   . Cancer Father     pancreatic and esophageal  . Obesity Sister   . Cancer Sister     breast    Social History   Social History  . Marital Status: Divorced    Spouse Name: N/A  . Number of Children: N/A  . Years of Education: N/A   Social History Main Topics  . Smoking status: Former Smoker -- 1.00 packs/day for 36 years    Types: Cigarettes    Quit date: 08/07/2014  . Smokeless tobacco: None  . Alcohol Use: Yes     Comment: occasional    (RARELY)    . Drug Use: No  . Sexual Activity: Not Asked   Other Topics Concern  . 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  All other systems are negative.  Review of Systems  Vital Signs: BP 134/70 mmHg  Pulse 75  Temp(Src) 98.1 F (36.7 C)  Resp 18  Ht '5\' 4"'$  (1.626 m)  Wt 146 lb (66.225 kg)  BMI 25.05 kg/m2  SpO2 99%  Physical Exam  Constitutional: She is oriented to person, place, and time. She appears well-developed and well-nourished. No distress.  HENT:  Head: Normocephalic.  Mouth/Throat: Oropharynx is clear and moist.  Neck: Normal range of motion. No JVD present. No tracheal deviation present.  Cardiovascular: Normal rate, regular rhythm and normal heart sounds.   No murmur heard. Pulmonary/Chest: Effort normal and breath sounds normal. No respiratory distress.  Neurological: She is alert and oriented to person, place, and time.  Skin: Skin is warm and dry.  Psychiatric: She has a normal mood and affect. Judgment normal.    Mallampati Score:  MD Evaluation Airway: WNL Heart: WNL Abdomen: WNL Chest/ Lungs: WNL ASA  Classification: 3 Mallampati/Airway Score: One  Imaging: Nm Pet Image Initial (pi) Skull Base To Thigh  04/03/2015  CLINICAL DATA:  Subsequent treatment strategy for left lung carcinoma. New left lung nodules on recent CT. EXAM: NUCLEAR MEDICINE PET SKULL BASE TO THIGH TECHNIQUE: 7.3 mCi F-18 FDG was injected intravenously. Full-ring PET imaging was performed from the skull base to thigh after the radiotracer. CT data was obtained and used for attenuation correction and anatomic localization. FASTING BLOOD GLUCOSE:  Value: 89 mg/dl COMPARISON:  Chest CT on 03/26/15 and PET-CT on 07/22/2014 FINDINGS: NECK No hypermetabolic lymph nodes in the neck. CHEST 11 mm mediastinal lymph node in the left lateral aortic region on  image 68/series 4 shows new hypermetabolic activity with SUV max of 3.8. New 13 mm mediastinal lymph node adjacent to the GE junction is seen on image 93/series 4 which is also hypermetabolic, with SUV max of 10.7. A new less than 1 cm hypermetabolic lymph node is seen in the left anterior cardiophrenic angle on image 102/series 4, with SUV max of 23.6. Numerous hypermetabolic soft tissue nodules are seen throughout the left pleural space which are new, and new tiny left pleural effusion also noted. Emphysema again noted, but no suspicious pulmonary nodules seen by CT. ABDOMEN/PELVIS No abnormal hypermetabolic activity within the  liver, pancreas, adrenal glands, or spleen. No hypermetabolic lymph nodes in the abdomen or pelvis. SKELETON No focal hypermetabolic activity to suggest skeletal metastasis. IMPRESSION: Numerous new small hypermetabolic soft tissue nodules throughout the left pleural space and tiny left pleural effusion, consistent with diffuse pleural metastatic disease. Mild hypermetabolic mediastinal lymphadenopathy in the left lateral aortic region, GE junction, and left anterior cardiophrenic angle, consistent with metastatic disease. No evidence of metastatic disease within the neck, abdomen, or pelvis. Electronically Signed   By: Earle Gell M.D.   On: 04/03/2015 10:21    Labs:  CBC:  Recent Labs  08/12/14 1248 08/15/14 0420 08/16/14 0520 03/26/15 0750  WBC 6.4 12.1* 7.3 8.5  HGB 15.4* 12.4 11.3* 13.9  HCT 45.5 37.8 35.1* 41.8  PLT 182 155 132* 194    COAGS:  Recent Labs  08/12/14 1248  INR 0.99  APTT 31    BMP:  Recent Labs  08/12/14 1248 08/15/14 0420 08/16/14 0520 03/26/15 0750  NA 137 141 138 139  K 4.3 4.5 3.9 4.2  CL 107 111 109  --   CO2 '21 27 25 23  '$ GLUCOSE 103* 103* 92 88  BUN '13 10 8 '$ 14.6  CALCIUM 9.4 8.0* 8.1* 9.3  CREATININE 0.72 0.66 0.59 0.8  GFRNONAA >90 >90 >90  --   GFRAA >90 >90 >90  --     LIVER FUNCTION TESTS:  Recent Labs   08/12/14 1248 08/16/14 0520 03/26/15 0750  BILITOT 0.7 0.7 0.33  AST '27 27 24  '$ ALT '17 20 17  '$ ALKPHOS 77 53 90  PROT 6.9 5.1* 7.3  ALBUMIN 4.1 2.7* 3.7    Assessment and Plan: Hypermetabolic left pleural lung nodules. For CT guided biopsy Labs pending Risks and Benefits discussed with the patient including, but not limited to bleeding, hemoptysis, respiratory failure requiring intubation, infection, pneumothorax requiring chest tube placement, stroke from air embolism or even death. All of the patient's questions were answered, patient is agreeable to proceed. Consent signed and in chart.   Thank you for this interesting consult.  A copy of this report was sent to the requesting provider on this date.  SignedAscencion Dike 04/30/2015, 8:21 AM   I spent a total of 20 minutes in face to face in clinical consultation, greater than 50% of which was counseling/coordinating care for CT guided left lung nodule biopsy

## 2015-05-04 ENCOUNTER — Other Ambulatory Visit: Payer: Self-pay | Admitting: Medical Oncology

## 2015-05-04 ENCOUNTER — Ambulatory Visit (HOSPITAL_COMMUNITY)
Admission: RE | Admit: 2015-05-04 | Discharge: 2015-05-04 | Disposition: A | Discharge status: Home / Self Care | Payer: 59 | Source: Ambulatory Visit | Attending: Internal Medicine | Admitting: Internal Medicine

## 2015-05-04 DIAGNOSIS — I6782 Cerebral ischemia: Secondary | ICD-10-CM | POA: Diagnosis not present

## 2015-05-04 DIAGNOSIS — C3432 Malignant neoplasm of lower lobe, left bronchus or lung: Secondary | ICD-10-CM

## 2015-05-04 MED ORDER — GADOBENATE DIMEGLUMINE 529 MG/ML IV SOLN
15.0000 mL | Freq: Once | INTRAVENOUS | Status: AC | PRN
  Administered 2015-05-04: 12 mL via INTRAVENOUS

## 2015-05-05 ENCOUNTER — Other Ambulatory Visit (HOSPITAL_BASED_OUTPATIENT_CLINIC_OR_DEPARTMENT_OTHER): Payer: 59

## 2015-05-05 ENCOUNTER — Telehealth: Payer: Self-pay | Admitting: Internal Medicine

## 2015-05-05 ENCOUNTER — Encounter: Payer: Self-pay | Admitting: Internal Medicine

## 2015-05-05 ENCOUNTER — Telehealth: Payer: Self-pay | Admitting: *Deleted

## 2015-05-05 ENCOUNTER — Ambulatory Visit (HOSPITAL_BASED_OUTPATIENT_CLINIC_OR_DEPARTMENT_OTHER): Payer: 59 | Admitting: Internal Medicine

## 2015-05-05 VITALS — BP 141/90 | HR 78 | Temp 98.5°F | Resp 17 | Ht 64.0 in | Wt 148.2 lb

## 2015-05-05 DIAGNOSIS — G8929 Other chronic pain: Secondary | ICD-10-CM | POA: Insufficient documentation

## 2015-05-05 DIAGNOSIS — F17200 Nicotine dependence, unspecified, uncomplicated: Secondary | ICD-10-CM | POA: Diagnosis not present

## 2015-05-05 DIAGNOSIS — C3412 Malignant neoplasm of upper lobe, left bronchus or lung: Secondary | ICD-10-CM | POA: Diagnosis not present

## 2015-05-05 DIAGNOSIS — Z7189 Other specified counseling: Secondary | ICD-10-CM

## 2015-05-05 DIAGNOSIS — C3432 Malignant neoplasm of lower lobe, left bronchus or lung: Secondary | ICD-10-CM | POA: Diagnosis not present

## 2015-05-05 DIAGNOSIS — Z5112 Encounter for antineoplastic immunotherapy: Secondary | ICD-10-CM

## 2015-05-05 LAB — CBC WITH DIFFERENTIAL/PLATELET
BASO%: 1.3 % (ref 0.0–2.0)
Basophils Absolute: 0.1 10*3/uL (ref 0.0–0.1)
EOS ABS: 0.3 10*3/uL (ref 0.0–0.5)
EOS%: 3 % (ref 0.0–7.0)
HEMATOCRIT: 46.4 % (ref 34.8–46.6)
HGB: 15.6 g/dL (ref 11.6–15.9)
LYMPH#: 2.5 10*3/uL (ref 0.9–3.3)
LYMPH%: 28 % (ref 14.0–49.7)
MCH: 32.2 pg (ref 25.1–34.0)
MCHC: 33.7 g/dL (ref 31.5–36.0)
MCV: 95.5 fL (ref 79.5–101.0)
MONO#: 0.6 10*3/uL (ref 0.1–0.9)
MONO%: 6.7 % (ref 0.0–14.0)
NEUT#: 5.5 10*3/uL (ref 1.5–6.5)
NEUT%: 61 % (ref 38.4–76.8)
PLATELETS: 273 10*3/uL (ref 145–400)
RBC: 4.86 10*6/uL (ref 3.70–5.45)
RDW: 14.4 % (ref 11.2–14.5)
WBC: 9 10*3/uL (ref 3.9–10.3)

## 2015-05-05 LAB — COMPREHENSIVE METABOLIC PANEL
ALT: 10 U/L (ref 0–55)
ANION GAP: 12 meq/L — AB (ref 3–11)
AST: 20 U/L (ref 5–34)
Albumin: 3.7 g/dL (ref 3.5–5.0)
Alkaline Phosphatase: 87 U/L (ref 40–150)
BILIRUBIN TOTAL: 0.32 mg/dL (ref 0.20–1.20)
BUN: 10.5 mg/dL (ref 7.0–26.0)
CALCIUM: 9.9 mg/dL (ref 8.4–10.4)
CHLORIDE: 106 meq/L (ref 98–109)
CO2: 22 mEq/L (ref 22–29)
CREATININE: 0.8 mg/dL (ref 0.6–1.1)
EGFR: 82 mL/min/{1.73_m2} — ABNORMAL LOW (ref >90)
Glucose: 84 mg/dl (ref 70–140)
Potassium: 4.3 mEq/L (ref 3.5–5.1)
Sodium: 140 mEq/L (ref 136–145)
TOTAL PROTEIN: 7.8 g/dL (ref 6.4–8.3)

## 2015-05-05 MED ORDER — TRAMADOL HCL 50 MG PO TABS: 50.0000 mg | ORAL_TABLET | Freq: Four times a day (QID) | ORAL | Status: DC | PRN

## 2015-05-05 NOTE — Telephone Encounter (Signed)
per pof to sch pt appt-gave pt copy of avs-sent MW email to sch trmt-pt to get updated copy '@chemo'$  class 12/9

## 2015-05-05 NOTE — Telephone Encounter (Signed)
Per staff message and POF I have scheduled appts. Advised scheduler of appts and to move labs. JMW  

## 2015-05-05 NOTE — Progress Notes (Signed)
[facility] Telephone:([phone]) [phone]   [phone]  OFFICE PROGRESS NOTE  [name], NP [address] [place]  [address]  DIAGNOSIS: Recurrent non-small cell lung cancer, adenocarcinoma with P DL 1 expression 60% diagnosed in November 2016. This was initially diagnosed as Stage IB (T2a, N0, M0) non-small cell lung cancer, adenocarcinoma diagnosed in March 2016.  PRIOR THERAPY: Status post left video-assisted thoracoscopy, wedge resection left lower lobe, thoracoscopic left lower lobectomy and mediastinal lymph node dissection on 08/14/2014.   CURRENT THERAPY: Ketruda 200 mg IV every 3 weeks, first dose expected on 05/12/2015.  INTERVAL HISTORY: Sharon [name] 54 y.o. female returns to the clinic today for follow-up visit accompanied by her daughter and mother. The patient is feeling fine today except for pain on the left side of her chest with mild shortness breath. The patient denied having any significant cough or hemoptysis. She denied having any significant weight loss or night sweats. She has no headache or visual changes. She has no nausea or vomiting, no fever or chills. She was found recently to have suspicious disease recurrence on the left side of the chest which was confirmed on a PET scan. The patient underwent CT-guided core biopsy of the left lower lobe pleural-based nodule by interventional radiology on 04/30/2015 and the final pathology (Accession: [id number]) was consistent with poorly differentiated adenocarcinoma. Immunohistochemical assay for PDL 1 expression showed TPS 60%. MRI of the brain on 05/04/2015 showed no evidence for metastatic disease to the brain. The patient is here today for evaluation and discussion of her treatment options based on the final biopsy results. Molecular studies for EGFR mutation and ALK gene translocation are still pending.  MEDICAL HISTORY: Past Medical History  Diagnosis Date  . Ischemic cardiomyopathy   .  Hyperlipidemia   . Tobacco user   . Coronary artery disease   . Occlusion of right coronary artery ([place])   . Aortic insufficiency     moderate by echo in 2009  . Acute ST segment elevation MI ([name])   . Neoplasm of uncertain behavior of left lower lobe of lung     per CT CHEST/PET 2/4 and 07/18/14 @ [facility]  . S/P coronary artery stent placement     2009  . COPD (chronic obstructive pulmonary disease) ([place])   . Emphysema of lung ([place])   . Pneumonia     2015   HX BRONCHITIS    . Shortness of breath dyspnea     WITH EXEERTION   . Arthritis   . Cancer ([place])     LUNGS    ALLERGIES:  is allergic to codeine; lipitor; morphine and related; and oxycodone-acetaminophen.  MEDICATIONS:  Current Outpatient Prescriptions  Medication Sig Dispense Refill  . alendronate (FOSAMAX) 10 MG tablet Take 10 mg by mouth once a week. Take with a full glass of water on an empty stomach.    . ALPRAZolam (XANAX) 0.25 MG tablet Take 0.25 mg by mouth 2 (two) times daily as needed for anxiety.    Marland Kitchen aspirin (ASPIR-LOW) 81 MG EC tablet Take 81 mg by mouth daily. On hold    . clopidogrel (PLAVIX) 75 MG tablet Take 1 tablet (75 mg total) by mouth daily. 90 tablet 3  . rosuvastatin (CRESTOR) 10 MG tablet Take 1 tablet (10 mg total) by mouth daily. 90 tablet 3  . Vitamin D, Ergocalciferol, (DRISDOL) 50000 UNITS CAPS capsule Take 50,000 Units by mouth every 7 (seven) days.  No current facility-administered medications for this visit.    SURGICAL HISTORY:  Past Surgical History  Procedure Laterality Date  . Cholecystectomy    . Tubal ligation    . Ectopic pregnancies requiring laparotomies    . Cardiac catheterization  10/15/2007    EF 30-40%  . Coronary angioplasty with stent placement      LAD  . US echocardiography  01/07/2008    EF 55-60%  . Transthoracic echocardiogram  10/17/2007  . Cesarean section    . Shoulder surgery      LEFT X 2   (REMOVED SOME COLLAR BONE )  . Video assisted thoracoscopy  (vats)/wedge resection Left 08/14/2014    Procedure: VIDEO ASSISTED THORACOSCOPY (VATS)/WEDGE RESECTION;  Surgeon: Melrose Nakayama, MD;  Location: [facility];  Service: Thoracic;  Laterality: Left;  . Lobectomy Left 08/14/2014    Procedure: LEFT LOWER LOBECTOMY;  Surgeon: Melrose Nakayama, MD;  Location: [facility];  Service: Thoracic;  Laterality: Left;  . Lead removal Left 08/14/2014    Procedure: CRYO INTERCOSTAL NERVE BLOCK;  Surgeon: Melrose Nakayama, MD;  Location: [facility];  Service: Thoracic;  Laterality: Left;  . Node dissection Left 08/14/2014    Procedure: NODE DISSECTION, LEFT LOWER LOBE LUNG;  Surgeon: Melrose Nakayama, MD;  Location: [facility];  Service: Thoracic;  Laterality: Left;    REVIEW OF SYSTEMS:  Constitutional: positive for fatigue Eyes: negative Ears, nose, mouth, throat, and face: negative Respiratory: positive for dyspnea on exertion and pleurisy/chest pain Cardiovascular: negative Gastrointestinal: negative Genitourinary:negative Integument/breast: negative Hematologic/lymphatic: negative Musculoskeletal:negative Neurological: negative Behavioral/Psych: negative Endocrine: negative Allergic/Immunologic: negative   PHYSICAL EXAMINATION: General appearance: alert, cooperative and no distress Head: Normocephalic, without obvious abnormality, atraumatic Neck: no adenopathy, no JVD, supple, symmetrical, trachea midline and thyroid not enlarged, symmetric, no tenderness/mass/nodules Lymph nodes: Cervical, supraclavicular, and axillary nodes normal. Resp: clear to auscultation bilaterally Back: symmetric, no curvature. ROM normal. No CVA tenderness. Cardio: regular rate and rhythm, S1, S2 normal, no murmur, click, rub or gallop GI: soft, non-tender; bowel sounds normal; no masses,  no organomegaly Extremities: extremities normal, atraumatic, no cyanosis or edema Neurologic: Alert and oriented X 3, normal strength and tone. Normal symmetric reflexes. Normal  coordination and gait  ECOG PERFORMANCE STATUS: 1 - Symptomatic but completely ambulatory  Blood pressure 141/90, pulse 78, temperature 98.5 F (36.9 C), temperature source Oral, resp. rate 17, height $RemoveBe'5\' 4"'WVJuXMvkt$  (1.626 m), weight 148 lb 3.2 oz (67.223 kg), SpO2 98 %.  LABORATORY DATA: Lab Results  Component Value Date   WBC 9.0 05/05/2015   HGB 15.6 05/05/2015   HCT 46.4 05/05/2015   MCV 95.5 05/05/2015   PLT 273 05/05/2015      Chemistry      Component Value Date/Time   NA 139 03/26/2015 0750   NA 138 08/16/2014 0520   K 4.2 03/26/2015 0750   K 3.9 08/16/2014 0520   CL 109 08/16/2014 0520   CO2 23 03/26/2015 0750   CO2 25 08/16/2014 0520   BUN 14.6 03/26/2015 0750   BUN 8 08/16/2014 0520   CREATININE 0.8 03/26/2015 0750   CREATININE 0.59 08/16/2014 0520      Component Value Date/Time   CALCIUM 9.3 03/26/2015 0750   CALCIUM 8.1* 08/16/2014 0520   ALKPHOS 90 03/26/2015 0750   ALKPHOS 53 08/16/2014 0520   AST 24 03/26/2015 0750   AST 27 08/16/2014 0520   ALT 17 03/26/2015 0750   ALT 20 08/16/2014 0520   BILITOT 0.33 03/26/2015  0750   BILITOT 0.7 08/16/2014 0520       RADIOGRAPHIC STUDIES: Mr Jeri Cos XH Contrast  06-01-2015  CLINICAL DATA:  Left lower lobe lung cancer diagnosed 07/2014. Staging. No neurological complaints at this time. EXAM: MRI HEAD WITHOUT AND WITH CONTRAST TECHNIQUE: Multiplanar, multiecho pulse sequences of the brain and surrounding structures were obtained without and with intravenous contrast. CONTRAST:  36mL MULTIHANCE GADOBENATE DIMEGLUMINE 529 MG/ML IV SOLN COMPARISON:  07/18/2007 FINDINGS: There is no evidence of acute infarct, intracranial hemorrhage, mass, midline shift, or extra-axial fluid collection. Ventricles and sulci are within normal limits for age. Small foci of T2 hyperintensity in the cerebral white matter bilaterally and pons are similar to the prior study and nonspecific but compatible with minimal to mild chronic small vessel  ischemic disease. No abnormal enhancement is identified. Orbits are unremarkable. Paranasal sinuses and mastoid air cells are clear. Major intracranial vascular flow voids are preserved. No suspicious skull lesions are identified. IMPRESSION: 1. No evidence of intracranial metastases. 2. Minimal chronic small vessel ischemic disease. Electronically Signed   By: Logan Bores M.D.   On: 06/01/2015 08:56   Ct Biopsy  04/30/2015  CLINICAL DATA:  54 year old female with a history of left lung poorly differentiated adenocarcinoma which was surgically resected. Now she has multiple small pleural-based nodules which demonstrate hypermetabolic activity on PET-CT. Findings are concerning for recurrent lung adenocarcinoma. CT-guided biopsy is warranted to confirm tissue diagnosis. EXAM: CT BIOPSY Date: 04/30/2015 PROCEDURE: 1. CT-guided biopsy left lower lobe pleural-based nodule Interventional Radiologist:  Criselda Peaches, MD ANESTHESIA/SEDATION: Moderate (conscious) sedation was used. 2 mg Versed, 150 mcg Fentanyl were administered intravenously. The patient's vital signs were monitored continuously by radiology nursing throughout the procedure. Sedation Time: 45 minutes MEDICATIONS: None additional TECHNIQUE: Informed consent was obtained from the patient following explanation of the procedure, risks, benefits and alternatives. The patient understands, agrees and consents for the procedure. All questions were addressed. A time out was performed. A planning axial CT scan was performed. The nodule in the pleura overlying the left lower lobe was successfully identified. A suitable skin entry site was selected and marked. The region was then sterilely prepped and draped in standard fashion using Betadine skin prep. Local anesthesia was attained by infiltration with 1% lidocaine. A small dermatotomy was made. Under intermittent CT fluoroscopic guidance, a 17 gauge trocar needle was advanced into the lung and positioned at  the margin of the nodule. A single 18 gauge core biopsy was then coaxially obtained using the BioPince automated biopsy device. Immediately following a single pass of a biopsy needle there was significant collapse of the left lung. Therefore, the 17 gauge introducer needle was exchanged over an Amplatz wire for a 10 cm 5 [place] Yueh centesis catheter. The centesis catheter was hooked up to a three-way stopcock an the pneumothorax evacuation. Once the pleural was re-apposed, a 10 mL aliquot of autologous blood drawn for peripheral IV was injected into the pleural space to serve as a pleural blood patch. Biopsy specimens were placed in formalin and delivered to pathology for further analysis. After 10 minutes, repeat CT imaging was performed demonstrating continued absence of significant pneumothorax. There is a trace basilar pneumothorax which is insignificant. The patient tolerated the procedure well. No evidence of recurrent pneumothorax at the 2-hour post biopsy chest x-ray. COMPLICATIONS: SIR level B: Nominal therapy (including overnight admission for observation), no consequence. Estimated blood loss:  0 IMPRESSION: Technically successful CT-guided biopsy left lower lobe pleural-based pulmonary nodule.  Signed, Criselda Peaches, MD [facility] Electronically Signed   By: Jacqulynn Cadet M.D.   On: 04/30/2015 13:34   Dg Chest Port 1 View  04/30/2015  CLINICAL DATA:  History of biopsy of the left lung nodule followup by pneumothorax with aspiration, evaluate for recurrent pneumothorax EXAM: PORTABLE CHEST 1 VIEW COMPARISON:  CT chest of 04/30/2015 FINDINGS: There is vague opacity at the left lung base which may reflect atelectasis and/or tiny left pleural effusion. The right lung is clear. No pneumothorax is seen. Mediastinal and hilar contours are unchanged and the heart is mildly enlarged. IMPRESSION: No pneumothorax. Minimal opacity at the left  lung base most consistent with atelectasis and possible small left effusion. Electronically Signed   By: Ivar Drape M.D.   On: 04/30/2015 13:31    ASSESSMENT AND PLAN: This is a very pleasant 54 years old white female with history of recurrent/metastatic non-small cell lung cancer , adenocarcinoma in November 2016 initially diagnosed as stage I B non-small cell lung cancer status post left lower lobectomy with mediastinal lymph node dissection with evidence of pleural invasion and has been on observation. Unfortunately the patient has evidence for disease recurrence and the recent CT scan of the chest showed new small left pleural effusion as well as new pleural based nodules worrisome for transpleural spread of tumor concerning for disease recurrence. The PET scan performed recently shows also numerous new small hypermetabolic soft tissue nodules throughout the left pleural space and small tiny left pleural effusion as well as hypermetabolic mediastinal lymphadenopathy. MRI of the brain showed no evidence for metastatic disease to the brain. I discussed the scan results and showed the images to the patient and her family. I recommended for her to have CT-guided core biopsy of one of the left lung pleural based nodules for confirmation of tissue diagnosis. The final pathology was consistent with adenocarcinoma. PDL 1 expression was 60%. Molecular studies for EGFR mutation and ALK gene translocations are still pending I had a lengthy discussion with the patient and her family about her current disease status and treatment options. I offered the patient the treatment with immunotherapy with Ketruda 200 mg IV every 3 weeks as a first-line treatment for her recurrent non-small cell lung cancer. I'm also waiting for the molecular studies and if the patient has evidence for EGFR mutation or active gene translocation, I would switch her treatment to one of these target agents. I discussed with the patient adverse  effect of immunotherapy including but not limited to immune mediated pneumonitis, skin rash, colitis, liver, renal, thyroid or other endocrine dysfunction. She is expected to start the first dose of this treatment next week. The patient would have a chemotherapy education class before starting the first dose of her treatment. For pain management she will was given a refill of tramadol.  I will see the patient back for follow-up visit in 4 weeks for reevaluation before starting cycle #2 of her immunotherapy.  The patient was advised to call immediately if she has any concerning symptoms in the interval. The patient voices understanding of current disease status and treatment options and is in agreement with the current care plan.  All questions were answered. The patient knows to call the clinic with any problems, questions or concerns. We can certainly see the patient much sooner if necessary.  Disclaimer: This note was dictated with voice recognition software. Similar sounding words can inadvertently be transcribed and may not be corrected upon  review.

## 2015-05-08 ENCOUNTER — Encounter (HOSPITAL_COMMUNITY): Payer: Self-pay

## 2015-05-08 ENCOUNTER — Other Ambulatory Visit: Payer: 59

## 2015-05-11 ENCOUNTER — Encounter (HOSPITAL_COMMUNITY): Payer: Self-pay

## 2015-05-11 ENCOUNTER — Telehealth: Payer: Self-pay | Admitting: Medical Oncology

## 2015-05-11 ENCOUNTER — Encounter: Payer: Self-pay | Admitting: Internal Medicine

## 2015-05-11 NOTE — Telephone Encounter (Signed)
Pt asking if she will see [facility] tomorrow , I called and left message that she is not scheduled to see him until jan as scheduled.

## 2015-05-11 NOTE — Progress Notes (Signed)
Contacted pt to introduce myself as Estate manager/land agent and to see if pt has met her OOP for the year. She states she has. I advised pt if she hadn't, I was checking to see if she could bring her income verification for possible financial assistance. She states she would need it copay assistance for January when it starts over. Patient states she has to print her stubs off online and does not have any paper or ink for her printer at home. Advised patient she could print from my computer if she would like tomorrow after her labs but before her treatment. Patient states ok but she thought she was seeing the doctor tomorrow before her treatment. I advised pt I did not see a doctor visit in but she could call the RN to clarify if she would be seeing the doctor. Pt said ok.

## 2015-05-12 ENCOUNTER — Ambulatory Visit (HOSPITAL_BASED_OUTPATIENT_CLINIC_OR_DEPARTMENT_OTHER): Payer: 59

## 2015-05-12 ENCOUNTER — Other Ambulatory Visit: Payer: 59

## 2015-05-12 ENCOUNTER — Other Ambulatory Visit: Payer: Self-pay | Admitting: Medical Oncology

## 2015-05-12 ENCOUNTER — Other Ambulatory Visit (HOSPITAL_BASED_OUTPATIENT_CLINIC_OR_DEPARTMENT_OTHER): Payer: 59

## 2015-05-12 ENCOUNTER — Encounter: Payer: Self-pay | Admitting: *Deleted

## 2015-05-12 VITALS — BP 124/50 | HR 64 | Temp 97.2°F | Resp 18

## 2015-05-12 DIAGNOSIS — Z5111 Encounter for antineoplastic chemotherapy: Secondary | ICD-10-CM | POA: Diagnosis not present

## 2015-05-12 DIAGNOSIS — C3412 Malignant neoplasm of upper lobe, left bronchus or lung: Secondary | ICD-10-CM

## 2015-05-12 DIAGNOSIS — C3432 Malignant neoplasm of lower lobe, left bronchus or lung: Secondary | ICD-10-CM

## 2015-05-12 LAB — COMPREHENSIVE METABOLIC PANEL
ALT: 14 U/L (ref 0–55)
ANION GAP: 10 meq/L (ref 3–11)
AST: 22 U/L (ref 5–34)
Albumin: 3.6 g/dL (ref 3.5–5.0)
Alkaline Phosphatase: 86 U/L (ref 40–150)
BILIRUBIN TOTAL: 0.39 mg/dL (ref 0.20–1.20)
BUN: 9.9 mg/dL (ref 7.0–26.0)
CHLORIDE: 107 meq/L (ref 98–109)
CO2: 24 meq/L (ref 22–29)
Calcium: 9.7 mg/dL (ref 8.4–10.4)
Creatinine: 0.8 mg/dL (ref 0.6–1.1)
EGFR: 86 mL/min/{1.73_m2} — AB (ref >90)
GLUCOSE: 76 mg/dL (ref 70–140)
Potassium: 3.7 mEq/L (ref 3.5–5.1)
Sodium: 140 mEq/L (ref 136–145)
TOTAL PROTEIN: 7.2 g/dL (ref 6.4–8.3)

## 2015-05-12 LAB — CBC WITH DIFFERENTIAL/PLATELET
BASO%: 1.2 % (ref 0.0–2.0)
Basophils Absolute: 0.1 10*3/uL (ref 0.0–0.1)
EOS%: 3.7 % (ref 0.0–7.0)
Eosinophils Absolute: 0.4 10*3/uL (ref 0.0–0.5)
HCT: 42.7 % (ref 34.8–46.6)
HGB: 14 g/dL (ref 11.6–15.9)
LYMPH%: 18.3 % (ref 14.0–49.7)
MCH: 31.3 pg (ref 25.1–34.0)
MCHC: 32.8 g/dL (ref 31.5–36.0)
MCV: 95.3 fL (ref 79.5–101.0)
MONO#: 0.7 10*3/uL (ref 0.1–0.9)
MONO%: 6.1 % (ref 0.0–14.0)
NEUT%: 70.7 % (ref 38.4–76.8)
NEUTROS ABS: 8 10*3/uL — AB (ref 1.5–6.5)
PLATELETS: 228 10*3/uL (ref 145–400)
RBC: 4.48 10*6/uL (ref 3.70–5.45)
RDW: 14.2 % (ref 11.2–14.5)
WBC: 11.3 10*3/uL — AB (ref 3.9–10.3)
lymph#: 2.1 10*3/uL (ref 0.9–3.3)

## 2015-05-12 MED ORDER — SODIUM CHLORIDE 0.9 % IV SOLN
200.0000 mg | Freq: Once | INTRAVENOUS | Status: AC
  Administered 2015-05-12: 200 mg via INTRAVENOUS
  Filled 2015-05-12: qty 8

## 2015-05-12 MED ORDER — SODIUM CHLORIDE 0.9 % IV SOLN
Freq: Once | INTRAVENOUS | Status: AC
  Administered 2015-05-12: 13:00:00 via INTRAVENOUS

## 2015-05-12 NOTE — Progress Notes (Signed)
Oncology Nurse Navigator Documentation  Oncology Nurse Navigator Flowsheets 05/12/2015  Navigator Encounter Type Clinic/MDC/spoke with patient and daughter today during her first chemo.  She stated she was a little nervous about the tx.  I listened as she explained.  I updated her that she will get a call tomorrow on how she did with the chemo.  I asked that she call the cancer center if needed.   Patient Visit Type Medonc  Treatment Phase First Chemo Tx  Interventions Other  Time Spent with Patient 15

## 2015-05-12 NOTE — Patient Instructions (Signed)
[facility] Discharge Instructions for Patients Receiving Chemotherapy  Today you received the following chemotherapy agents: Keytruda   To help prevent nausea and vomiting after your treatment, we encourage you to take your nausea medication as directed    If you develop nausea and vomiting that is not controlled by your nausea medication, call the clinic.   BELOW ARE SYMPTOMS THAT SHOULD BE REPORTED IMMEDIATELY:  *FEVER GREATER THAN 100.5 F  *CHILLS WITH OR WITHOUT FEVER  NAUSEA AND VOMITING THAT IS NOT CONTROLLED WITH YOUR NAUSEA MEDICATION  *UNUSUAL SHORTNESS OF BREATH  *UNUSUAL BRUISING OR BLEEDING  TENDERNESS IN MOUTH AND THROAT WITH OR WITHOUT PRESENCE OF ULCERS  *URINARY PROBLEMS  *BOWEL PROBLEMS  UNUSUAL RASH Items with * indicate a potential emergency and should be followed up as soon as possible.  Feel free to call the clinic you have any questions or concerns. The clinic phone number is [phone].  Please show the CHEMO ALERT CARD at check-in to the Emergency Department and triage nurse.   

## 2015-05-13 ENCOUNTER — Telehealth: Payer: Self-pay | Admitting: Medical Oncology

## 2015-05-13 NOTE — Telephone Encounter (Signed)
Pt doing well without problems . I instructed her to call if she has any questions or concerns.

## 2015-06-02 ENCOUNTER — Other Ambulatory Visit: Payer: 59

## 2015-06-03 ENCOUNTER — Telehealth: Payer: Self-pay | Admitting: Internal Medicine

## 2015-06-03 ENCOUNTER — Ambulatory Visit (HOSPITAL_BASED_OUTPATIENT_CLINIC_OR_DEPARTMENT_OTHER): Payer: BLUE CROSS/BLUE SHIELD | Admitting: Internal Medicine

## 2015-06-03 ENCOUNTER — Other Ambulatory Visit (HOSPITAL_BASED_OUTPATIENT_CLINIC_OR_DEPARTMENT_OTHER): Payer: BLUE CROSS/BLUE SHIELD

## 2015-06-03 ENCOUNTER — Ambulatory Visit (HOSPITAL_BASED_OUTPATIENT_CLINIC_OR_DEPARTMENT_OTHER): Payer: BLUE CROSS/BLUE SHIELD

## 2015-06-03 ENCOUNTER — Telehealth: Payer: Self-pay | Admitting: *Deleted

## 2015-06-03 ENCOUNTER — Encounter: Payer: Self-pay | Admitting: Internal Medicine

## 2015-06-03 VITALS — BP 132/69 | HR 80 | Temp 98.4°F | Resp 18 | Ht 64.0 in | Wt 144.5 lb

## 2015-06-03 DIAGNOSIS — Z5112 Encounter for antineoplastic immunotherapy: Secondary | ICD-10-CM

## 2015-06-03 DIAGNOSIS — R229 Localized swelling, mass and lump, unspecified: Secondary | ICD-10-CM

## 2015-06-03 DIAGNOSIS — C3432 Malignant neoplasm of lower lobe, left bronchus or lung: Secondary | ICD-10-CM

## 2015-06-03 DIAGNOSIS — R911 Solitary pulmonary nodule: Secondary | ICD-10-CM | POA: Diagnosis not present

## 2015-06-03 DIAGNOSIS — R079 Chest pain, unspecified: Secondary | ICD-10-CM

## 2015-06-03 DIAGNOSIS — J90 Pleural effusion, not elsewhere classified: Secondary | ICD-10-CM | POA: Diagnosis not present

## 2015-06-03 DIAGNOSIS — C3412 Malignant neoplasm of upper lobe, left bronchus or lung: Secondary | ICD-10-CM | POA: Diagnosis not present

## 2015-06-03 DIAGNOSIS — G8929 Other chronic pain: Secondary | ICD-10-CM

## 2015-06-03 LAB — CBC WITH DIFFERENTIAL/PLATELET
BASO%: 0.8 % (ref 0.0–2.0)
BASOS ABS: 0.1 10*3/uL (ref 0.0–0.1)
EOS%: 3.4 % (ref 0.0–7.0)
Eosinophils Absolute: 0.2 10*3/uL (ref 0.0–0.5)
HEMATOCRIT: 45.8 % (ref 34.8–46.6)
HEMOGLOBIN: 15.3 g/dL (ref 11.6–15.9)
LYMPH#: 1.6 10*3/uL (ref 0.9–3.3)
LYMPH%: 24.5 % (ref 14.0–49.7)
MCH: 31.9 pg (ref 25.1–34.0)
MCHC: 33.3 g/dL (ref 31.5–36.0)
MCV: 95.7 fL (ref 79.5–101.0)
MONO#: 0.5 10*3/uL (ref 0.1–0.9)
MONO%: 7 % (ref 0.0–14.0)
NEUT#: 4.2 10*3/uL (ref 1.5–6.5)
NEUT%: 64.3 % (ref 38.4–76.8)
Platelets: 227 10*3/uL (ref 145–400)
RBC: 4.79 10*6/uL (ref 3.70–5.45)
RDW: 14.5 % (ref 11.2–14.5)
WBC: 6.6 10*3/uL (ref 3.9–10.3)

## 2015-06-03 LAB — COMPREHENSIVE METABOLIC PANEL
ALBUMIN: 3.6 g/dL (ref 3.5–5.0)
ALK PHOS: 95 U/L (ref 40–150)
ALT: 14 U/L (ref 0–55)
ANION GAP: 11 meq/L (ref 3–11)
AST: 20 U/L (ref 5–34)
BILIRUBIN TOTAL: 0.51 mg/dL (ref 0.20–1.20)
BUN: 9.9 mg/dL (ref 7.0–26.0)
CALCIUM: 9.4 mg/dL (ref 8.4–10.4)
CO2: 23 mEq/L (ref 22–29)
Chloride: 106 mEq/L (ref 98–109)
Creatinine: 0.8 mg/dL (ref 0.6–1.1)
EGFR: 83 mL/min/{1.73_m2} — AB (ref >90)
GLUCOSE: 81 mg/dL (ref 70–140)
Potassium: 4 mEq/L (ref 3.5–5.1)
Sodium: 139 mEq/L (ref 136–145)
TOTAL PROTEIN: 7.8 g/dL (ref 6.4–8.3)

## 2015-06-03 MED ORDER — SODIUM CHLORIDE 0.9 % IV SOLN
200.0000 mg | Freq: Once | INTRAVENOUS | Status: AC
  Administered 2015-06-03: 200 mg via INTRAVENOUS
  Filled 2015-06-03: qty 8

## 2015-06-03 MED ORDER — TRAMADOL HCL 50 MG PO TABS: 50.0000 mg | ORAL_TABLET | Freq: Four times a day (QID) | ORAL | Status: DC | PRN

## 2015-06-03 MED ORDER — SODIUM CHLORIDE 0.9 % IV SOLN
Freq: Once | INTRAVENOUS | Status: AC
  Administered 2015-06-03: 11:00:00 via INTRAVENOUS

## 2015-06-03 NOTE — Telephone Encounter (Signed)
Per staff message and POF I have scheduled appts. Advised scheduler of appts. JMW  

## 2015-06-03 NOTE — Patient Instructions (Signed)
[facility] Discharge Instructions for Patients Receiving Chemotherapy  Today you received the following chemotherapy agents: Keytruda   To help prevent nausea and vomiting after your treatment, we encourage you to take your nausea medication as directed    If you develop nausea and vomiting that is not controlled by your nausea medication, call the clinic.   BELOW ARE SYMPTOMS THAT SHOULD BE REPORTED IMMEDIATELY:  *FEVER GREATER THAN 100.5 F  *CHILLS WITH OR WITHOUT FEVER  NAUSEA AND VOMITING THAT IS NOT CONTROLLED WITH YOUR NAUSEA MEDICATION  *UNUSUAL SHORTNESS OF BREATH  *UNUSUAL BRUISING OR BLEEDING  TENDERNESS IN MOUTH AND THROAT WITH OR WITHOUT PRESENCE OF ULCERS  *URINARY PROBLEMS  *BOWEL PROBLEMS  UNUSUAL RASH Items with * indicate a potential emergency and should be followed up as soon as possible.  Feel free to call the clinic you have any questions or concerns. The clinic phone number is [phone].  Please show the CHEMO ALERT CARD at check-in to the Emergency Department and triage nurse.   

## 2015-06-03 NOTE — Progress Notes (Signed)
[facility] [phone]) [phone]   [phone]  OFFICE PROGRESS NOTE  [name], NP [address] [place] [address]  DIAGNOSIS: Recurrent non-small cell lung cancer, adenocarcinoma with P DL 1 expression 60% diagnosed in November 2016. This was initially diagnosed as Stage IB (T2a, N0, M0) non-small cell lung cancer, adenocarcinoma diagnosed in March 2016.  PRIOR THERAPY: Status post left video-assisted thoracoscopy, wedge resection left lower lobe, thoracoscopic left lower lobectomy and mediastinal lymph node dissection on 08/14/2014.   CURRENT THERAPY: Ketruda 200 mg IV every 3 weeks, first dose expected on 05/12/2015. Status post one cycle.  INTERVAL HISTORY: Sharon [name] 55 y.o. female returns to the clinic today for follow-up visit accompanied by her mother. The patient is feeling fine today except for pain on the left side of her chest with mild shortness breath. She is currently on tramadol as well as some help. She tolerated the first cycle of her treatment with immunotherapy with [place] fairly well. She denied having any significant skin rash or diarrhea. She had mild nausea and constipation. The patient has no significant weight loss or night sweats. She has no fever or chills. She is here today to start cycle #2 of her treatment.  MEDICAL HISTORY: Past Medical History  Diagnosis Date  . Ischemic cardiomyopathy   . Hyperlipidemia   . Tobacco user   . Coronary artery disease   . Occlusion of right coronary artery ([facility])   . Aortic insufficiency     moderate by echo in 2009  . Acute ST segment elevation MI ([facility])   . Neoplasm of uncertain behavior of left lower lobe of lung     per CT CHEST/PET 2/4 and 07/18/14 @ [facility]  . S/P coronary artery stent placement     2009  . COPD (chronic obstructive pulmonary disease) ([facility])   . Emphysema of lung ([facility])   . Pneumonia     2015   HX BRONCHITIS    . Shortness of breath dyspnea     WITH  EXEERTION   . Arthritis   . Cancer ([facility])     LUNGS    ALLERGIES:  is allergic to codeine; lipitor; morphine and related; and oxycodone-acetaminophen.  MEDICATIONS:  Current Outpatient Prescriptions  Medication Sig Dispense Refill  . alendronate (FOSAMAX) 70 MG tablet Take 70 mg by mouth once a week.  0  . ALPRAZolam (XANAX) 0.25 MG tablet Take 0.25 mg by mouth 2 (two) times daily as needed for anxiety.    Marland Kitchen aspirin (ASPIR-LOW) 81 MG EC tablet Take 81 mg by mouth daily. On hold    . clopidogrel (PLAVIX) 75 MG tablet Take 1 tablet (75 mg total) by mouth daily. 90 tablet 3  . rosuvastatin (CRESTOR) 10 MG tablet Take 1 tablet (10 mg total) by mouth daily. 90 tablet 3  . traMADol (ULTRAM) 50 MG tablet Take 1 tablet (50 mg total) by mouth every 6 (six) hours as needed. 60 tablet 0  . Vitamin D, Ergocalciferol, (DRISDOL) 50000 UNITS CAPS capsule Take 50,000 Units by mouth every 7 (seven) days.     No current facility-administered medications for this visit.    SURGICAL HISTORY:  Past Surgical History  Procedure Laterality Date  . Cholecystectomy    . Tubal ligation    . Ectopic pregnancies requiring laparotomies    . Cardiac catheterization  10/15/2007    EF 30-40%  . Coronary angioplasty with stent placement  LAD  . US echocardiography  01/07/2008    EF 55-60%  . Transthoracic echocardiogram  10/17/2007  . Cesarean section    . Shoulder surgery      LEFT X 2   (REMOVED SOME COLLAR BONE )  . Video assisted thoracoscopy (vats)/wedge resection Left 08/14/2014    Procedure: VIDEO ASSISTED THORACOSCOPY (VATS)/WEDGE RESECTION;  Surgeon: Melrose Nakayama, MD;  Location: [facility];  Service: Thoracic;  Laterality: Left;  . Lobectomy Left 08/14/2014    Procedure: LEFT LOWER LOBECTOMY;  Surgeon: Melrose Nakayama, MD;  Location: [facility];  Service: Thoracic;  Laterality: Left;  . Lead removal Left 08/14/2014    Procedure: CRYO INTERCOSTAL NERVE BLOCK;  Surgeon: Melrose Nakayama, MD;   Location: [facility];  Service: Thoracic;  Laterality: Left;  . Node dissection Left 08/14/2014    Procedure: NODE DISSECTION, LEFT LOWER LOBE LUNG;  Surgeon: Melrose Nakayama, MD;  Location: [facility];  Service: Thoracic;  Laterality: Left;    REVIEW OF SYSTEMS:  Constitutional: positive for fatigue Eyes: negative Ears, nose, mouth, throat, and face: negative Respiratory: positive for dyspnea on exertion and pleurisy/chest pain Cardiovascular: negative Gastrointestinal: positive for constipation and nausea Genitourinary:negative Integument/breast: negative Hematologic/lymphatic: negative Musculoskeletal:negative Neurological: negative Behavioral/Psych: negative Endocrine: negative Allergic/Immunologic: negative   PHYSICAL EXAMINATION: General appearance: alert, cooperative and no distress Head: Normocephalic, without obvious abnormality, atraumatic Neck: no adenopathy, no JVD, supple, symmetrical, trachea midline and thyroid not enlarged, symmetric, no tenderness/mass/nodules Lymph nodes: Cervical, supraclavicular, and axillary nodes normal. Resp: clear to auscultation bilaterally Back: symmetric, no curvature. ROM normal. No CVA tenderness. Cardio: regular rate and rhythm, S1, S2 normal, no murmur, click, rub or gallop GI: soft, non-tender; bowel sounds normal; no masses,  no organomegaly Extremities: extremities normal, atraumatic, no cyanosis or edema Neurologic: Alert and oriented X 3, normal strength and tone. Normal symmetric reflexes. Normal coordination and gait  ECOG PERFORMANCE STATUS: 1 - Symptomatic but completely ambulatory  Blood pressure 132/69, pulse 80, temperature 98.4 F (36.9 C), temperature source Oral, resp. rate 18, height '5\' 4"'$  (1.626 m), weight 144 lb 8 oz (65.545 kg), SpO2 99 %.  LABORATORY DATA: Lab Results  Component Value Date   WBC 6.6 06/03/2015   HGB 15.3 06/03/2015   HCT 45.8 06/03/2015   MCV 95.7 06/03/2015   PLT 227 06/03/2015      Chemistry       Component Value Date/Time   NA 139 06/03/2015 0855   NA 138 08/16/2014 0520   K 4.0 06/03/2015 0855   K 3.9 08/16/2014 0520   CL 109 08/16/2014 0520   CO2 23 06/03/2015 0855   CO2 25 08/16/2014 0520   BUN 9.9 06/03/2015 0855   BUN 8 08/16/2014 0520   CREATININE 0.8 06/03/2015 0855   CREATININE 0.59 08/16/2014 0520      Component Value Date/Time   CALCIUM 9.4 06/03/2015 0855   CALCIUM 8.1* 08/16/2014 0520   ALKPHOS 95 06/03/2015 0855   ALKPHOS 53 08/16/2014 0520   AST 20 06/03/2015 0855   AST 27 08/16/2014 0520   ALT 14 06/03/2015 0855   ALT 20 08/16/2014 0520   BILITOT 0.51 06/03/2015 0855   BILITOT 0.7 08/16/2014 0520       RADIOGRAPHIC STUDIES: No results found.  ASSESSMENT AND PLAN: This is a very pleasant 55 years old white female with history of recurrent/metastatic non-small cell lung cancer , adenocarcinoma in November 2016 initially diagnosed as stage I B non-small cell lung cancer status post left  lower lobectomy with mediastinal lymph node dissection with evidence of pleural invasion and has been on observation. Unfortunately the patient has evidence for disease recurrence and the recent CT scan of the chest showed new small left pleural effusion as well as new pleural based nodules worrisome for transpleural spread of tumor concerning for disease recurrence. The PET scan performed recently shows also numerous new small hypermetabolic soft tissue nodules throughout the left pleural space and small tiny left pleural effusion as well as hypermetabolic mediastinal lymphadenopathy. MRI of the brain showed no evidence for metastatic disease to the brain. The patient is currently on treatment with immunotherapy with Ketruda (pembrolizumab) status post 1 cycle and tolerated the first cycle of her treatment fairly well. I recommended for her to proceed with cycle #2 today as a scheduled. She will come back for follow-up visit in 3 weeks for reevaluation before starting  cycle #3. For pain management she will was given a refill of tramadol. She was also advised to take ibuprofen 2-3 tablets daily for the pain on the left side of the chest. The patient was advised to call immediately if she has any concerning symptoms in the interval. The patient voices understanding of current disease status and treatment options and is in agreement with the current care plan.  All questions were answered. The patient knows to call the clinic with any problems, questions or concerns. We can certainly see the patient much sooner if necessary.  Disclaimer: This note was dictated with voice recognition software. Similar sounding words can inadvertently be transcribed and may not be corrected upon review.

## 2015-06-03 NOTE — Telephone Encounter (Signed)
per pof to sch pt appt-sent MW email to sch pt trmt-pt to get updated copy b4 leaving

## 2015-06-24 ENCOUNTER — Telehealth: Payer: Self-pay | Admitting: *Deleted

## 2015-06-24 ENCOUNTER — Telehealth: Payer: Self-pay | Admitting: Internal Medicine

## 2015-06-24 ENCOUNTER — Ambulatory Visit (HOSPITAL_BASED_OUTPATIENT_CLINIC_OR_DEPARTMENT_OTHER): Payer: BLUE CROSS/BLUE SHIELD

## 2015-06-24 ENCOUNTER — Ambulatory Visit (HOSPITAL_BASED_OUTPATIENT_CLINIC_OR_DEPARTMENT_OTHER): Payer: BLUE CROSS/BLUE SHIELD | Admitting: Internal Medicine

## 2015-06-24 ENCOUNTER — Other Ambulatory Visit (HOSPITAL_BASED_OUTPATIENT_CLINIC_OR_DEPARTMENT_OTHER): Payer: BLUE CROSS/BLUE SHIELD

## 2015-06-24 ENCOUNTER — Encounter: Payer: Self-pay | Admitting: Internal Medicine

## 2015-06-24 VITALS — BP 117/74 | HR 79 | Temp 97.0°F | Resp 18 | Ht 64.0 in | Wt 142.9 lb

## 2015-06-24 DIAGNOSIS — R599 Enlarged lymph nodes, unspecified: Secondary | ICD-10-CM

## 2015-06-24 DIAGNOSIS — C3412 Malignant neoplasm of upper lobe, left bronchus or lung: Secondary | ICD-10-CM

## 2015-06-24 DIAGNOSIS — Z5112 Encounter for antineoplastic immunotherapy: Secondary | ICD-10-CM | POA: Diagnosis not present

## 2015-06-24 DIAGNOSIS — C3432 Malignant neoplasm of lower lobe, left bronchus or lung: Secondary | ICD-10-CM

## 2015-06-24 DIAGNOSIS — Z720 Tobacco use: Secondary | ICD-10-CM

## 2015-06-24 DIAGNOSIS — J90 Pleural effusion, not elsewhere classified: Secondary | ICD-10-CM

## 2015-06-24 DIAGNOSIS — M799 Soft tissue disorder, unspecified: Secondary | ICD-10-CM

## 2015-06-24 LAB — COMPREHENSIVE METABOLIC PANEL
ALT: 15 U/L (ref 0–55)
ANION GAP: 10 meq/L (ref 3–11)
AST: 22 U/L (ref 5–34)
Albumin: 3.5 g/dL (ref 3.5–5.0)
Alkaline Phosphatase: 86 U/L (ref 40–150)
BILIRUBIN TOTAL: 0.48 mg/dL (ref 0.20–1.20)
BUN: 8.9 mg/dL (ref 7.0–26.0)
CALCIUM: 9.4 mg/dL (ref 8.4–10.4)
CO2: 23 mEq/L (ref 22–29)
CREATININE: 0.8 mg/dL (ref 0.6–1.1)
Chloride: 107 mEq/L (ref 98–109)
EGFR: 88 mL/min/{1.73_m2} — ABNORMAL LOW (ref >90)
Glucose: 121 mg/dl (ref 70–140)
Potassium: 3.1 mEq/L — ABNORMAL LOW (ref 3.5–5.1)
Sodium: 140 mEq/L (ref 136–145)
TOTAL PROTEIN: 7.4 g/dL (ref 6.4–8.3)

## 2015-06-24 LAB — CBC WITH DIFFERENTIAL/PLATELET
BASO%: 0.4 % (ref 0.0–2.0)
Basophils Absolute: 0 10*3/uL (ref 0.0–0.1)
EOS%: 3.4 % (ref 0.0–7.0)
Eosinophils Absolute: 0.2 10*3/uL (ref 0.0–0.5)
HEMATOCRIT: 42.1 % (ref 34.8–46.6)
HEMOGLOBIN: 14.2 g/dL (ref 11.6–15.9)
LYMPH#: 1.9 10*3/uL (ref 0.9–3.3)
LYMPH%: 35 % (ref 14.0–49.7)
MCH: 31.9 pg (ref 25.1–34.0)
MCHC: 33.7 g/dL (ref 31.5–36.0)
MCV: 94.6 fL (ref 79.5–101.0)
MONO#: 0.3 10*3/uL (ref 0.1–0.9)
MONO%: 5.6 % (ref 0.0–14.0)
NEUT%: 55.6 % (ref 38.4–76.8)
NEUTROS ABS: 3 10*3/uL (ref 1.5–6.5)
PLATELETS: 199 10*3/uL (ref 145–400)
RBC: 4.45 10*6/uL (ref 3.70–5.45)
RDW: 14.1 % (ref 11.2–14.5)
WBC: 5.3 10*3/uL (ref 3.9–10.3)

## 2015-06-24 MED ORDER — SODIUM CHLORIDE 0.9 % IV SOLN
200.0000 mg | Freq: Once | INTRAVENOUS | Status: AC
  Administered 2015-06-24: 200 mg via INTRAVENOUS
  Filled 2015-06-24: qty 8

## 2015-06-24 MED ORDER — POTASSIUM CHLORIDE CRYS ER 20 MEQ PO TBCR: 20.0000 meq | EXTENDED_RELEASE_TABLET | Freq: Every day | ORAL | Status: DC

## 2015-06-24 MED ORDER — SODIUM CHLORIDE 0.9 % IV SOLN
Freq: Once | INTRAVENOUS | Status: AC
  Administered 2015-06-24: 11:00:00 via INTRAVENOUS

## 2015-06-24 NOTE — Telephone Encounter (Signed)
Per staff message and POF I have scheduled appts. Advised scheduler of appts. JMW  

## 2015-06-24 NOTE — Telephone Encounter (Signed)
-----   Message from Curt Bears, MD sent at 06/24/2015  2:48 PM EST ----- Call patient with the result and order K Dur 20 meq po qd x 7 days.

## 2015-06-24 NOTE — Progress Notes (Signed)
Per Stanton Kidney RN - There may be a prescription called in for Potassium but if that is not ordered today than patient was educated on potassium rich diet and intake.

## 2015-06-24 NOTE — Patient Instructions (Addendum)
Hypokalemia Hypokalemia means that the amount of potassium in the blood is lower than normal.Potassium is a chemical, called an electrolyte, that helps regulate the amount of fluid in the body. It also stimulates muscle contraction and helps nerves function properly.Most of the body's potassium is inside of cells, and only a very small amount is in the blood. Because the amount in the blood is so small, minor changes can be life-threatening. CAUSES 1. Antibiotics. 2. Diarrhea or vomiting. 3. Using laxatives too much, which can cause diarrhea. 4. Chronic kidney disease. 5. Water pills (diuretics). 6. Eating disorders (bulimia). 7. Low magnesium level. 8. Sweating a lot. SIGNS AND SYMPTOMS 1. Weakness. 2. Constipation. 3. Fatigue. 4. Muscle cramps. 5. Mental confusion. 6. Skipped heartbeats or irregular heartbeat (palpitations). 7. Tingling or numbness. DIAGNOSIS  Your health care provider can diagnose hypokalemia with blood tests. In addition to checking your potassium level, your health care provider may also check other lab tests. TREATMENT Hypokalemia can be treated with potassium supplements taken by mouth or adjustments in your current medicines. If your potassium level is very low, you may need to get potassium through a vein (IV) and be monitored in the hospital. A diet high in potassium is also helpful. Foods high in potassium are:  Nuts, such as peanuts and pistachios.  Seeds, such as sunflower seeds and pumpkin seeds.  Peas, lentils, and lima beans.  Whole grain and bran cereals and breads.  Fresh fruit and vegetables, such as apricots, avocado, bananas, cantaloupe, kiwi, oranges, tomatoes, asparagus, and potatoes.  Orange and tomato juices.  Red meats.  Fruit yogurt. HOME CARE INSTRUCTIONS  Take all medicines as prescribed by your health care provider.  Maintain a healthy diet by including nutritious food, such as fruits, vegetables, nuts, whole grains, and lean  meats.  If you are taking a laxative, be sure to follow the directions on the label. SEEK MEDICAL CARE IF:  Your weakness gets worse.  You feel your heart pounding or racing.  You are vomiting or having diarrhea.  You are diabetic and having trouble keeping your blood glucose in the normal range. SEEK IMMEDIATE MEDICAL CARE IF:  You have chest pain, shortness of breath, or dizziness.  You are vomiting or having diarrhea for more than 2 days.  You faint. MAKE SURE YOU:   Understand these instructions.  Will watch your condition.  Will get help right away if you are not doing well or get worse.   This information is not intended to replace advice given to you by your health care provider. Make sure you discuss any questions you have with your health care provider.   Document Released: 05/16/2005 Document Revised: 06/06/2014 Document Reviewed: 11/16/2012 Elsevier Interactive Patient Education 2016 [place] Discharge Instructions for Patients Receiving Chemotherapy  Today you received the following chemotherapy agents Keytruda.  To help prevent nausea and vomiting after your treatment, we encourage you to take your nausea medication as directed.  If you develop nausea and vomiting that is not controlled by your nausea medication, call the clinic.   BELOW ARE SYMPTOMS THAT SHOULD BE REPORTED IMMEDIATELY:  *FEVER GREATER THAN 100.5 F  *CHILLS WITH OR WITHOUT FEVER  NAUSEA AND VOMITING THAT IS NOT CONTROLLED WITH YOUR NAUSEA MEDICATION  *UNUSUAL SHORTNESS OF BREATH  *UNUSUAL BRUISING OR BLEEDING  TENDERNESS IN MOUTH AND THROAT WITH OR WITHOUT PRESENCE OF ULCERS  *URINARY PROBLEMS  *BOWEL PROBLEMS  UNUSUAL RASH Items with * indicate a  potential emergency and should be followed up as soon as possible.  Feel free to call the clinic you have any questions or concerns. The clinic phone number is [phone].  Please show the  [facility] at check-in to the Emergency Department and triage nurse.

## 2015-06-24 NOTE — Telephone Encounter (Signed)
K Dur 12mq po qd x 7 days sent to pt pharmacy Pt notified

## 2015-06-24 NOTE — Telephone Encounter (Signed)
Pt confirmed labs/ov per 01/25 POF, gave pt AVS and Calendar..... KJ, gave pt barium for CT scan

## 2015-06-24 NOTE — Progress Notes (Signed)
[facility] [phone]   [phone]  OFFICE PROGRESS NOTE  Sharon Riches, Sharon [name] [address] [place] [address]  DIAGNOSIS: Recurrent non-small cell lung cancer, adenocarcinoma with PDL 1 expression 60% diagnosed in November 2016. This was initially diagnosed as Stage IB (T2a, N0, M0) non-small cell lung cancer, adenocarcinoma diagnosed in March 2016.  PRIOR THERAPY: Status post left video-assisted thoracoscopy, wedge resection left lower lobe, thoracoscopic left lower lobectomy and mediastinal lymph node dissection on 08/14/2014.   CURRENT THERAPY: Ketruda 200 mg IV every 3 weeks, first dose expected on 05/12/2015. Status post 2 cycles.  INTERVAL HISTORY: Sharon [name] 55 y.o. female returns to the clinic today for follow-up visit accompanied by her daughter. The patient is feeling fine today except for fatigue. 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 Unfortunately she continues to smoke a few cigarettes every day and I strongly encouraged her to quit smoking. She tolerated the second cycle of her treatment with immunotherapy with [place] fairly well. She denied having any significant skin rash or diarrhea. She had mild nausea resolved with Compazine. The patient has no significant weight loss or night sweats. She has no fever or chills. She is here today to start cycle #3 of her treatment.  MEDICAL HISTORY: Past Medical History  Diagnosis Date  . Ischemic cardiomyopathy   . Hyperlipidemia   . Tobacco user   . Coronary artery disease   . Occlusion of right coronary artery ([facility])   . Aortic insufficiency     moderate by echo in 2009  . Acute ST segment elevation MI ([facility])   . Neoplasm of uncertain behavior of left lower lobe of lung     per CT CHEST/PET 2/4 and 07/18/14 @ [facility]  . S/P coronary artery stent placement     2009  . COPD (chronic obstructive pulmonary disease) ([facility])   . Emphysema of lung ([facility])   .  Pneumonia     2015   HX BRONCHITIS    . Shortness of breath dyspnea     WITH EXEERTION   . Arthritis   . Cancer ([facility])     LUNGS    ALLERGIES:  is allergic to codeine; lipitor; morphine and related; and oxycodone-acetaminophen.  MEDICATIONS:  Current Outpatient Prescriptions  Medication Sig Dispense Refill  . alendronate (FOSAMAX) 70 MG tablet Take 70 mg by mouth once a week.  0  . ALPRAZolam (XANAX) 0.25 MG tablet Take 0.25 mg by mouth 2 (two) times daily as needed for anxiety.    Marland Kitchen aspirin (ASPIR-LOW) 81 MG EC tablet Take 81 mg by mouth daily. On hold    . clopidogrel (PLAVIX) 75 MG tablet Take 1 tablet (75 mg total) by mouth daily. 90 tablet 3  . D3-50 50000 units capsule Take 50,000 Units by mouth once a week.  0  . rosuvastatin (CRESTOR) 10 MG tablet Take 1 tablet (10 mg total) by mouth daily. 90 tablet 3  . traMADol (ULTRAM) 50 MG tablet Take 1 tablet (50 mg total) by mouth every 6 (six) hours as needed. 60 tablet 0  . Vitamin D, Ergocalciferol, (DRISDOL) 50000 UNITS CAPS capsule Take 50,000 Units by mouth every 7 (seven) days.     No current facility-administered medications for this visit.    SURGICAL HISTORY:  Past Surgical History  Procedure Laterality Date  . Cholecystectomy    . Tubal ligation    . Ectopic pregnancies requiring laparotomies    .  Cardiac catheterization  10/15/2007    EF 30-40%  . Coronary angioplasty with stent placement      LAD  . US echocardiography  01/07/2008    EF 55-60%  . Transthoracic echocardiogram  10/17/2007  . Cesarean section    . Shoulder surgery      LEFT X 2   (REMOVED SOME COLLAR BONE )  . Video assisted thoracoscopy (vats)/wedge resection Left 08/14/2014    Procedure: VIDEO ASSISTED THORACOSCOPY (VATS)/WEDGE RESECTION;  Surgeon: Melrose Nakayama, MD;  Location: [facility];  Service: Thoracic;  Laterality: Left;  . Lobectomy Left 08/14/2014    Procedure: LEFT LOWER LOBECTOMY;  Surgeon: Melrose Nakayama, MD;  Location: [facility];   Service: Thoracic;  Laterality: Left;  . Lead removal Left 08/14/2014    Procedure: CRYO INTERCOSTAL NERVE BLOCK;  Surgeon: Melrose Nakayama, MD;  Location: [facility];  Service: Thoracic;  Laterality: Left;  . Node dissection Left 08/14/2014    Procedure: NODE DISSECTION, LEFT LOWER LOBE LUNG;  Surgeon: Melrose Nakayama, MD;  Location: [facility];  Service: Thoracic;  Laterality: Left;    REVIEW OF SYSTEMS:  A comprehensive review of systems was negative except for: Constitutional: positive for fatigue Respiratory: positive for dyspnea on exertion   PHYSICAL EXAMINATION: General appearance: alert, cooperative and no distress Head: Normocephalic, without obvious abnormality, atraumatic Neck: no adenopathy, no JVD, supple, symmetrical, trachea midline and thyroid not enlarged, symmetric, no tenderness/mass/nodules Lymph nodes: Cervical, supraclavicular, and axillary nodes normal. Resp: clear to auscultation bilaterally Back: symmetric, no curvature. ROM normal. No CVA tenderness. Cardio: regular rate and rhythm, S1, S2 normal, no murmur, click, rub or gallop GI: soft, non-tender; bowel sounds normal; no masses,  no organomegaly Extremities: extremities normal, atraumatic, no cyanosis or edema Neurologic: Alert and oriented X 3, normal strength and tone. Normal symmetric reflexes. Normal coordination and gait  ECOG PERFORMANCE STATUS: 1 - Symptomatic but completely ambulatory  Blood pressure 117/74, pulse 79, temperature 97 F (36.1 C), temperature source Oral, resp. rate 18, height '5\' 4"'$  (1.626 m), weight 142 lb 14.4 oz (64.819 kg), SpO2 100 %.  LABORATORY DATA: Lab Results  Component Value Date   WBC 5.3 06/24/2015   HGB 14.2 06/24/2015   HCT 42.1 06/24/2015   MCV 94.6 06/24/2015   PLT 199 06/24/2015      Chemistry      Component Value Date/Time   NA 139 06/03/2015 0855   NA 138 08/16/2014 0520   K 4.0 06/03/2015 0855   K 3.9 08/16/2014 0520   CL 109 08/16/2014 0520   CO2 23  06/03/2015 0855   CO2 25 08/16/2014 0520   BUN 9.9 06/03/2015 0855   BUN 8 08/16/2014 0520   CREATININE 0.8 06/03/2015 0855   CREATININE 0.59 08/16/2014 0520      Component Value Date/Time   CALCIUM 9.4 06/03/2015 0855   CALCIUM 8.1* 08/16/2014 0520   ALKPHOS 95 06/03/2015 0855   ALKPHOS 53 08/16/2014 0520   AST 20 06/03/2015 0855   AST 27 08/16/2014 0520   ALT 14 06/03/2015 0855   ALT 20 08/16/2014 0520   BILITOT 0.51 06/03/2015 0855   BILITOT 0.7 08/16/2014 0520       RADIOGRAPHIC STUDIES: No results found.  ASSESSMENT AND PLAN: This is a very pleasant 55 years old white female with history of recurrent/metastatic non-small cell lung cancer , adenocarcinoma in November 2016 initially diagnosed as stage I B non-small cell lung cancer status post left lower lobectomy with mediastinal  lymph node dissection with evidence of pleural invasion and has been on observation. Unfortunately the patient has evidence for disease recurrence and the recent CT scan of the chest showed new small left pleural effusion as well as new pleural based nodules worrisome for transpleural spread of tumor concerning for disease recurrence. The PET scan performed recently shows also numerous new small hypermetabolic soft tissue nodules throughout the left pleural space and small tiny left pleural effusion as well as hypermetabolic mediastinal lymphadenopathy. MRI of the brain showed no evidence for metastatic disease to the brain. The patient is currently on treatment with immunotherapy with Ketruda (pembrolizumab) status post 2 cycles. She is tolerating her treatment fairly well. I recommended for her to proceed with cycle #3 today as a scheduled. She will come back for follow-up visit in 3 weeks for reevaluation before starting cycle #4 after repeating CT scan of the chest, abdomen and pelvis for restaging of her disease. For smoke cessation, I strongly encouraged the patient to quit smoking and offered her  to smoke cessation program. The patient was advised to call immediately if she has any concerning symptoms in the interval. The patient voices understanding of current disease status and treatment options and is in agreement with the current care plan.  All questions were answered. The patient knows to call the clinic with any problems, questions or concerns. We can certainly see the patient much sooner if necessary.  Disclaimer: This note was dictated with voice recognition software. Similar sounding words can inadvertently be transcribed and may not be corrected upon review.

## 2015-06-24 NOTE — Progress Notes (Signed)
Quick Note:  Call patient with the result and order K Dur 20 meq po qd x7 days ______ 

## 2015-07-14 ENCOUNTER — Ambulatory Visit (HOSPITAL_COMMUNITY)
Admission: RE | Admit: 2015-07-14 | Discharge: 2015-07-14 | Disposition: A | Discharge status: Home / Self Care | Payer: BLUE CROSS/BLUE SHIELD | Source: Ambulatory Visit | Attending: Internal Medicine | Admitting: Internal Medicine

## 2015-07-14 ENCOUNTER — Encounter (HOSPITAL_COMMUNITY): Payer: Self-pay

## 2015-07-14 DIAGNOSIS — Z5112 Encounter for antineoplastic immunotherapy: Secondary | ICD-10-CM | POA: Insufficient documentation

## 2015-07-14 DIAGNOSIS — C3432 Malignant neoplasm of lower lobe, left bronchus or lung: Secondary | ICD-10-CM | POA: Insufficient documentation

## 2015-07-14 DIAGNOSIS — R590 Localized enlarged lymph nodes: Secondary | ICD-10-CM | POA: Diagnosis not present

## 2015-07-14 DIAGNOSIS — J90 Pleural effusion, not elsewhere classified: Secondary | ICD-10-CM | POA: Insufficient documentation

## 2015-07-14 DIAGNOSIS — R911 Solitary pulmonary nodule: Secondary | ICD-10-CM | POA: Insufficient documentation

## 2015-07-14 MED ORDER — IOHEXOL 300 MG/ML  SOLN
100.0000 mL | Freq: Once | INTRAMUSCULAR | Status: AC | PRN
  Administered 2015-07-14: 100 mL via INTRAVENOUS

## 2015-07-15 ENCOUNTER — Ambulatory Visit (HOSPITAL_BASED_OUTPATIENT_CLINIC_OR_DEPARTMENT_OTHER): Payer: BLUE CROSS/BLUE SHIELD | Admitting: Internal Medicine

## 2015-07-15 ENCOUNTER — Telehealth: Payer: Self-pay | Admitting: *Deleted

## 2015-07-15 ENCOUNTER — Ambulatory Visit (HOSPITAL_BASED_OUTPATIENT_CLINIC_OR_DEPARTMENT_OTHER): Payer: BLUE CROSS/BLUE SHIELD

## 2015-07-15 ENCOUNTER — Other Ambulatory Visit (HOSPITAL_BASED_OUTPATIENT_CLINIC_OR_DEPARTMENT_OTHER): Payer: BLUE CROSS/BLUE SHIELD

## 2015-07-15 ENCOUNTER — Encounter: Payer: Self-pay | Admitting: Internal Medicine

## 2015-07-15 ENCOUNTER — Telehealth: Payer: Self-pay | Admitting: Internal Medicine

## 2015-07-15 VITALS — BP 128/49 | HR 65 | Temp 98.1°F | Resp 18 | Ht 64.0 in | Wt 139.2 lb

## 2015-07-15 DIAGNOSIS — R911 Solitary pulmonary nodule: Secondary | ICD-10-CM

## 2015-07-15 DIAGNOSIS — Z720 Tobacco use: Secondary | ICD-10-CM

## 2015-07-15 DIAGNOSIS — C3432 Malignant neoplasm of lower lobe, left bronchus or lung: Secondary | ICD-10-CM | POA: Diagnosis not present

## 2015-07-15 DIAGNOSIS — J90 Pleural effusion, not elsewhere classified: Secondary | ICD-10-CM

## 2015-07-15 DIAGNOSIS — Z5112 Encounter for antineoplastic immunotherapy: Secondary | ICD-10-CM

## 2015-07-15 DIAGNOSIS — C3412 Malignant neoplasm of upper lobe, left bronchus or lung: Secondary | ICD-10-CM

## 2015-07-15 DIAGNOSIS — R229 Localized swelling, mass and lump, unspecified: Secondary | ICD-10-CM | POA: Diagnosis not present

## 2015-07-15 DIAGNOSIS — Z79899 Other long term (current) drug therapy: Secondary | ICD-10-CM | POA: Diagnosis not present

## 2015-07-15 LAB — CBC WITH DIFFERENTIAL/PLATELET
BASO%: 1 % (ref 0.0–2.0)
Basophils Absolute: 0.1 10*3/uL (ref 0.0–0.1)
EOS%: 1.7 % (ref 0.0–7.0)
Eosinophils Absolute: 0.1 10*3/uL (ref 0.0–0.5)
HCT: 44.6 % (ref 34.8–46.6)
HEMOGLOBIN: 14.9 g/dL (ref 11.6–15.9)
LYMPH%: 25.7 % (ref 14.0–49.7)
MCH: 31.2 pg (ref 25.1–34.0)
MCHC: 33.4 g/dL (ref 31.5–36.0)
MCV: 93.5 fL (ref 79.5–101.0)
MONO#: 0.4 10*3/uL (ref 0.1–0.9)
MONO%: 6.1 % (ref 0.0–14.0)
NEUT%: 65.5 % (ref 38.4–76.8)
NEUTROS ABS: 4.6 10*3/uL (ref 1.5–6.5)
Platelets: 233 10*3/uL (ref 145–400)
RBC: 4.77 10*6/uL (ref 3.70–5.45)
RDW: 13.8 % (ref 11.2–14.5)
WBC: 7.1 10*3/uL (ref 3.9–10.3)
lymph#: 1.8 10*3/uL (ref 0.9–3.3)

## 2015-07-15 LAB — COMPREHENSIVE METABOLIC PANEL
ALBUMIN: 3.4 g/dL — AB (ref 3.5–5.0)
ALK PHOS: 96 U/L (ref 40–150)
ALT: 15 U/L (ref 0–55)
AST: 22 U/L (ref 5–34)
Anion Gap: 10 mEq/L (ref 3–11)
BILIRUBIN TOTAL: 0.39 mg/dL (ref 0.20–1.20)
BUN: 6.7 mg/dL — AB (ref 7.0–26.0)
CO2: 21 mEq/L — ABNORMAL LOW (ref 22–29)
Calcium: 9.6 mg/dL (ref 8.4–10.4)
Chloride: 109 mEq/L (ref 98–109)
Creatinine: 0.7 mg/dL (ref 0.6–1.1)
EGFR: >90 mL/min/{1.73_m2} (ref >90)
GLUCOSE: 111 mg/dL (ref 70–140)
POTASSIUM: 3.8 meq/L (ref 3.5–5.1)
SODIUM: 140 meq/L (ref 136–145)
TOTAL PROTEIN: 7.3 g/dL (ref 6.4–8.3)

## 2015-07-15 LAB — TSH: TSH: <0.08 m(IU)/L — ABNORMAL LOW (ref 0.308–3.960)

## 2015-07-15 MED ORDER — SODIUM CHLORIDE 0.9 % IV SOLN
Freq: Once | INTRAVENOUS | Status: AC
  Administered 2015-07-15: 12:00:00 via INTRAVENOUS

## 2015-07-15 MED ORDER — SODIUM CHLORIDE 0.9 % IV SOLN
200.0000 mg | Freq: Once | INTRAVENOUS | Status: AC
  Administered 2015-07-15: 200 mg via INTRAVENOUS
  Filled 2015-07-15: qty 8

## 2015-07-15 NOTE — Patient Instructions (Signed)
Smoking Cessation, Tips for Success If you are ready to quit smoking, congratulations! You have chosen to help yourself be healthier. Cigarettes bring nicotine, tar, carbon monoxide, and other irritants into your body. Your lungs, heart, and blood vessels will be able to work better without these poisons. There are many different ways to quit smoking. Nicotine gum, nicotine patches, a nicotine inhaler, or nicotine nasal spray can help with physical craving. Hypnosis, support groups, and medicines help break the habit of smoking. WHAT THINGS CAN I DO TO MAKE QUITTING EASIER?  Here are some tips to help you quit for good:  Pick a date when you will quit smoking completely. Tell all of your friends and family about your plan to quit on that date.  Do not try to slowly cut down on the number of cigarettes you are smoking. Pick a quit date and quit smoking completely starting on that day.  Throw away all cigarettes.   Clean and remove all ashtrays from your home, work, and car.  On a card, write down your reasons for quitting. Carry the card with you and read it when you get the urge to smoke.  Cleanse your body of nicotine. Drink enough water and fluids to keep your urine clear or pale yellow. Do this after quitting to flush the nicotine from your body.  Learn to predict your moods. Do not let a bad situation be your excuse to have a cigarette. Some situations in your life might tempt you into wanting a cigarette.  Never have "just one" cigarette. It leads to wanting another and another. Remind yourself of your decision to quit.  Change habits associated with smoking. If you smoked while driving or when feeling stressed, try other activities to replace smoking. Stand up when drinking your coffee. Brush your teeth after eating. Sit in a different chair when you read the paper. Avoid alcohol while trying to quit, and try to drink fewer caffeinated beverages. Alcohol and caffeine may urge you to  smoke.  Avoid foods and drinks that can trigger a desire to smoke, such as sugary or spicy foods and alcohol.  Ask people who smoke not to smoke around you.  Have something planned to do right after eating or having a cup of coffee. For example, plan to take a walk or exercise.  Try a relaxation exercise to calm you down and decrease your stress. Remember, you may be tense and nervous for the first 2 weeks after you quit, but this will pass.  Find new activities to keep your hands busy. Play with a pen, coin, or rubber band. Doodle or draw things on paper.  Brush your teeth right after eating. This will help cut down on the craving for the taste of tobacco after meals. You can also try mouthwash.   Use oral substitutes in place of cigarettes. Try using lemon drops, carrots, cinnamon sticks, or chewing gum. Keep them handy so they are available when you have the urge to smoke.  When you have the urge to smoke, try deep breathing.  Designate your home as a nonsmoking area.  If you are a heavy smoker, ask your health care provider about a prescription for nicotine chewing gum. It can ease your withdrawal from nicotine.  Reward yourself. Set aside the cigarette money you save and buy yourself something nice.  Look for support from others. Join a support group or smoking cessation program. Ask someone at home or at work to help you with your plan   to quit smoking.  Always ask yourself, "Do I need this cigarette or is this just a reflex?" Tell yourself, "Today, I choose not to smoke," or "I do not want to smoke." You are reminding yourself of your decision to quit.  Do not replace cigarette smoking with electronic cigarettes (commonly called e-cigarettes). The safety of e-cigarettes is unknown, and some may contain harmful chemicals.  If you relapse, do not give up! Plan ahead and think about what you will do the next time you get the urge to smoke. HOW WILL I FEEL WHEN I QUIT SMOKING? You  may have symptoms of withdrawal because your body is used to nicotine (the addictive substance in cigarettes). You may crave cigarettes, be irritable, feel very hungry, cough often, get headaches, or have difficulty concentrating. The withdrawal symptoms are only temporary. They are strongest when you first quit but will go away within 10-14 days. When withdrawal symptoms occur, stay in control. Think about your reasons for quitting. Remind yourself that these are signs that your body is healing and getting used to being without cigarettes. Remember that withdrawal symptoms are easier to treat than the major diseases that smoking can cause.  Even after the withdrawal is over, expect periodic urges to smoke. However, these cravings are generally short lived and will go away whether you smoke or not. Do not smoke! WHAT RESOURCES ARE AVAILABLE TO HELP ME QUIT SMOKING? Your health care provider can direct you to community resources or hospitals for support, which may include:  Group support.  Education.  Hypnosis.  Therapy.   This information is not intended to replace advice given to you by your health care provider. Make sure you discuss any questions you have with your health care provider.   Document Released: 02/12/2004 Document Revised: 06/06/2014 Document Reviewed: 11/01/2012 Elsevier Interactive Patient Education 2016 [employer]  

## 2015-07-15 NOTE — Telephone Encounter (Signed)
Pt confirmed labs/ov per 02/15 POF, gave pt AVS and Calendar.... KJ, sent msg to add chemo

## 2015-07-15 NOTE — Telephone Encounter (Signed)
Per staff message and POF I have scheduled appts. Advised scheduler of appts. JMW  

## 2015-07-15 NOTE — Progress Notes (Signed)
[place] [phone]   [phone]  OFFICE PROGRESS NOTE  [name], NP [address] [place]  [address]  DIAGNOSIS: Recurrent non-small cell lung cancer, adenocarcinoma with PDL 1 expression 60% diagnosed in November 2016. This was initially diagnosed as Stage IB (T2a, N0, M0) non-small cell lung cancer, adenocarcinoma diagnosed in March 2016.  PRIOR THERAPY: Status post left video-assisted thoracoscopy, wedge resection left lower lobe, thoracoscopic left lower lobectomy and mediastinal lymph node dissection on 08/14/2014.   CURRENT THERAPY: Ketruda 200 mg IV every 3 weeks, first dose expected on 05/12/2015. Status post 3 cycles.  INTERVAL HISTORY: Sharon [name] 55 y.o. female returns to the clinic today for follow-up visit accompanied by her daughter and mother. The patient is feeling fine today except for fatigue and occasional nausea. She takes Zofran only at nighttime. She tolerated the last cycle of her treatment with immunotherapy with [place] fairly well. She denied having any significant skin rash or diarrhea. She lost around city pounds since her last visit. She has no chest pain, shortness breath, cough or hemoptysis. She has no fever or chills. She had repeat CT scan of the chest, abdomen and pelvis performed yesterday and she is here today for evaluation and discussion of her scan results before starting the next cycle of her treatment.   MEDICAL HISTORY: Past Medical History  Diagnosis Date  . Ischemic cardiomyopathy   . Hyperlipidemia   . Tobacco user   . Coronary artery disease   . Occlusion of right coronary artery ([place])   . Aortic insufficiency     moderate by echo in 2009  . Acute ST segment elevation MI ([place])   . Neoplasm of uncertain behavior of left lower lobe of lung     per CT CHEST/PET 2/4 and 07/18/14 @ [facility]  . S/P coronary artery stent placement     2009  . COPD (chronic obstructive pulmonary disease)  ([place])   . Emphysema of lung ([place])   . Pneumonia     2015   HX BRONCHITIS    . Shortness of breath dyspnea     WITH EXEERTION   . Arthritis   . Cancer ([place])     LUNGS    ALLERGIES:  is allergic to codeine; lipitor; morphine and related; and oxycodone-acetaminophen.  MEDICATIONS:  Current Outpatient Prescriptions  Medication Sig Dispense Refill  . alendronate (FOSAMAX) 70 MG tablet Take 70 mg by mouth once a week.  0  . ALPRAZolam (XANAX) 0.25 MG tablet Take 0.25 mg by mouth 2 (two) times daily as needed for anxiety.    Marland Kitchen aspirin (ASPIR-LOW) 81 MG EC tablet Take 81 mg by mouth daily. On hold    . clopidogrel (PLAVIX) 75 MG tablet Take 1 tablet (75 mg total) by mouth daily. 90 tablet 3  . D3-50 50000 units capsule Take 50,000 Units by mouth once a week.  0  . ondansetron (ZOFRAN) 8 MG tablet Take 8 mg by mouth every 8 (eight) hours as needed for nausea or vomiting.    . potassium chloride SA (K-DUR,KLOR-CON) 20 MEQ tablet Take 1 tablet (20 mEq total) by mouth daily. 7 tablet 0  . rosuvastatin (CRESTOR) 10 MG tablet Take 1 tablet (10 mg total) by mouth daily. 90 tablet 3  . traMADol (ULTRAM) 50 MG tablet Take 1 tablet (50 mg total) by mouth every 6 (six) hours as needed. 60 tablet 0  . Vitamin D, Ergocalciferol, (DRISDOL) 50000 UNITS  CAPS capsule Take 50,000 Units by mouth every 7 (seven) days.     No current facility-administered medications for this visit.    SURGICAL HISTORY:  Past Surgical History  Procedure Laterality Date  . Cholecystectomy    . Tubal ligation    . Ectopic pregnancies requiring laparotomies    . Cardiac catheterization  10/15/2007    EF 30-40%  . Coronary angioplasty with stent placement      LAD  . US echocardiography  01/07/2008    EF 55-60%  . Transthoracic echocardiogram  10/17/2007  . Cesarean section    . Shoulder surgery      LEFT X 2   (REMOVED SOME COLLAR BONE )  . Video assisted thoracoscopy (vats)/wedge resection Left 08/14/2014    Procedure:  VIDEO ASSISTED THORACOSCOPY (VATS)/WEDGE RESECTION;  Surgeon: Melrose Nakayama, MD;  Location: [facility];  Service: Thoracic;  Laterality: Left;  . Lobectomy Left 08/14/2014    Procedure: LEFT LOWER LOBECTOMY;  Surgeon: Melrose Nakayama, MD;  Location: [facility];  Service: Thoracic;  Laterality: Left;  . Lead removal Left 08/14/2014    Procedure: CRYO INTERCOSTAL NERVE BLOCK;  Surgeon: Melrose Nakayama, MD;  Location: [facility];  Service: Thoracic;  Laterality: Left;  . Node dissection Left 08/14/2014    Procedure: NODE DISSECTION, LEFT LOWER LOBE LUNG;  Surgeon: Melrose Nakayama, MD;  Location: [facility];  Service: Thoracic;  Laterality: Left;    REVIEW OF SYSTEMS:  Constitutional: positive for fatigue Eyes: negative Ears, nose, mouth, throat, and face: negative Respiratory: negative Cardiovascular: negative Gastrointestinal: positive for nausea Genitourinary:negative Integument/breast: negative Hematologic/lymphatic: negative Musculoskeletal:negative Neurological: negative Behavioral/Psych: negative Endocrine: negative Allergic/Immunologic: negative   PHYSICAL EXAMINATION: General appearance: alert, cooperative and no distress Head: Normocephalic, without obvious abnormality, atraumatic Neck: no adenopathy, no JVD, supple, symmetrical, trachea midline and thyroid not enlarged, symmetric, no tenderness/mass/nodules Lymph nodes: Cervical, supraclavicular, and axillary nodes normal. Resp: clear to auscultation bilaterally Back: symmetric, no curvature. ROM normal. No CVA tenderness. Cardio: regular rate and rhythm, S1, S2 normal, no murmur, click, rub or gallop GI: soft, non-tender; bowel sounds normal; no masses,  no organomegaly Extremities: extremities normal, atraumatic, no cyanosis or edema Neurologic: Alert and oriented X 3, normal strength and tone. Normal symmetric reflexes. Normal coordination and gait  ECOG PERFORMANCE STATUS: 1 - Symptomatic but completely  ambulatory  Blood pressure 128/49, pulse 65, temperature 98.1 F (36.7 C), temperature source Oral, resp. rate 18, height '5\' 4"'$  (1.626 m), weight 139 lb 3.2 oz (63.141 kg), SpO2 97 %.  LABORATORY DATA: Lab Results  Component Value Date   WBC 7.1 07/15/2015   HGB 14.9 07/15/2015   HCT 44.6 07/15/2015   MCV 93.5 07/15/2015   PLT 233 07/15/2015      Chemistry      Component Value Date/Time   NA 140 07/15/2015 1015   NA 138 08/16/2014 0520   K 3.8 07/15/2015 1015   K 3.9 08/16/2014 0520   CL 109 08/16/2014 0520   CO2 21* 07/15/2015 1015   CO2 25 08/16/2014 0520   BUN 6.7* 07/15/2015 1015   BUN 8 08/16/2014 0520   CREATININE 0.7 07/15/2015 1015   CREATININE 0.59 08/16/2014 0520      Component Value Date/Time   CALCIUM 9.6 07/15/2015 1015   CALCIUM 8.1* 08/16/2014 0520   ALKPHOS 96 07/15/2015 1015   ALKPHOS 53 08/16/2014 0520   AST 22 07/15/2015 1015   AST 27 08/16/2014 0520   ALT 15 07/15/2015 1015  ALT 20 08/16/2014 0520   BILITOT 0.39 07/15/2015 1015   BILITOT 0.7 08/16/2014 0520       RADIOGRAPHIC STUDIES: Ct Chest W Contrast  07/14/2015  CLINICAL DATA:  Followup left lung carcinoma. Currently undergoing immunotherapy. Restaging. EXAM: CT CHEST, ABDOMEN, AND PELVIS WITH CONTRAST TECHNIQUE: Multidetector CT imaging of the chest, abdomen and pelvis was performed following the standard protocol during bolus administration of intravenous contrast. CONTRAST:  144m OMNIPAQUE IOHEXOL 300 MG/ML  SOLN COMPARISON:  PET-CT on 04/30/2015 FINDINGS: CT CHEST FINDINGS Mediastinum/Lymph Nodes: Mild mediastinal lymphadenopathy in the high right paratracheal and lateral aortic regions show no significant change, with index lymph node measuring 11 mm on image 24/series 2. Previously seen hypermetabolic soft tissue density in the left cardiophrenic angle and adjacent to the GE junction has nearly completely resolved since prior exam. No new sites of lymphadenopathy identified within the  thorax. No evidence of pericardial effusion. Lungs/Pleura: Tiny left pleural effusion has decreased in size since previous study. Previously seen small pulmonary nodules in the left lung base have nearly completely resolved and are no longer measurable. Mild emphysema again noted. A 3 mm pulmonary nodule is seen in the peripheral right upper lobe on image 17/series 6 which was not seen previously but is too small to characterize. Right lower lobe scarring remains stable. Musculoskeletal: No chest wall mass or suspicious bone lesions identified. CT ABDOMEN PELVIS FINDINGS Hepatobiliary: No masses or other significant abnormality. Prior cholecystectomy noted. No evidence of biliary dilatation. Pancreas: No mass, inflammatory changes, or other significant abnormality. Spleen: Within normal limits in size and appearance. Adrenals/Urinary Tract: No masses identified. No evidence of hydronephrosis. Small amount of gas noted in urinary bladder, likely due to recent catheterization/instrumentation. Stomach/Bowel: No evidence of obstruction, inflammatory process, or abnormal fluid collections. Vascular/Lymphatic: No pathologically enlarged lymph nodes. No evidence of abdominal aortic aneurysm. Aortic atherosclerotic plaque noted. Reproductive: No mass or other significant abnormality. Other: None. Musculoskeletal:  No suspicious bone lesions identified. IMPRESSION: Partial interval response to therapy, with decrease in mediastinal lymphadenopathy, left lower lobe pulmonary nodules, and small left pleural effusion. New indeterminate 3 mm right upper lobe pulmonary nodule. Recommend attention on follow-up imaging. No evidence of metastatic disease or other acute findings within the abdomen or pelvis. Electronically Signed   By: JEarle GellM.D.   On: 07/14/2015 09:44   Ct Abdomen Pelvis W Contrast  07/14/2015  CLINICAL DATA:  Followup left lung carcinoma. Currently undergoing immunotherapy. Restaging. EXAM: CT CHEST,  ABDOMEN, AND PELVIS WITH CONTRAST TECHNIQUE: Multidetector CT imaging of the chest, abdomen and pelvis was performed following the standard protocol during bolus administration of intravenous contrast. CONTRAST:  1043mOMNIPAQUE IOHEXOL 300 MG/ML  SOLN COMPARISON:  PET-CT on 04/30/2015 FINDINGS: CT CHEST FINDINGS Mediastinum/Lymph Nodes: Mild mediastinal lymphadenopathy in the high right paratracheal and lateral aortic regions show no significant change, with index lymph node measuring 11 mm on image 24/series 2. Previously seen hypermetabolic soft tissue density in the left cardiophrenic angle and adjacent to the GE junction has nearly completely resolved since prior exam. No new sites of lymphadenopathy identified within the thorax. No evidence of pericardial effusion. Lungs/Pleura: Tiny left pleural effusion has decreased in size since previous study. Previously seen small pulmonary nodules in the left lung base have nearly completely resolved and are no longer measurable. Mild emphysema again noted. A 3 mm pulmonary nodule is seen in the peripheral right upper lobe on image 17/series 6 which was not seen previously but is too small to  characterize. Right lower lobe scarring remains stable. Musculoskeletal: No chest wall mass or suspicious bone lesions identified. CT ABDOMEN PELVIS FINDINGS Hepatobiliary: No masses or other significant abnormality. Prior cholecystectomy noted. No evidence of biliary dilatation. Pancreas: No mass, inflammatory changes, or other significant abnormality. Spleen: Within normal limits in size and appearance. Adrenals/Urinary Tract: No masses identified. No evidence of hydronephrosis. Small amount of gas noted in urinary bladder, likely due to recent catheterization/instrumentation. Stomach/Bowel: No evidence of obstruction, inflammatory process, or abnormal fluid collections. Vascular/Lymphatic: No pathologically enlarged lymph nodes. No evidence of abdominal aortic aneurysm. Aortic  atherosclerotic plaque noted. Reproductive: No mass or other significant abnormality. Other: None. Musculoskeletal:  No suspicious bone lesions identified. IMPRESSION: Partial interval response to therapy, with decrease in mediastinal lymphadenopathy, left lower lobe pulmonary nodules, and small left pleural effusion. New indeterminate 3 mm right upper lobe pulmonary nodule. Recommend attention on follow-up imaging. No evidence of metastatic disease or other acute findings within the abdomen or pelvis. Electronically Signed   By: Earle Gell M.D.   On: 07/14/2015 09:44    ASSESSMENT AND PLAN: This is a very pleasant 55 years old white female with history of recurrent/metastatic non-small cell lung cancer , adenocarcinoma in November 2016 initially diagnosed as stage IB non-small cell lung cancer status post left lower lobectomy with mediastinal lymph node dissection with evidence of pleural invasion and has been on observation. Unfortunately the patient has evidence for disease recurrence and the recent CT scan of the chest showed new small left pleural effusion as well as new pleural based nodules worrisome for transpleural spread of tumor concerning for disease recurrence. The PET scan performed recently shows also numerous new small hypermetabolic soft tissue nodules throughout the left pleural space and small tiny left pleural effusion as well as hypermetabolic mediastinal lymphadenopathy. The patient is currently on treatment with immunotherapy with Ketruda (pembrolizumab) status post 3 cycles. She is tolerating her treatment fairly well. The recent CT scan of the chest, abdomen and pelvis showed significant improvement of her disease with resolution of many of the pulmonary nodules and decrease in the mediastinal lymphadenopathy. There was new 0.3 cm right upper lobe nodule that we'll continue to monitor closed the on upcoming scan. I discussed the scan results with the patient and her family. I  recommended for her to continue her current immunotherapy. She will receive cycle #4 today. The patient would come back for follow-up visit in 3 weeks for reevaluation before starting cycle #5. For smoke cessation, I strongly encouraged the patient to quit smoking and offered her to smoke cessation program. The patient was advised to call immediately if she has any concerning symptoms in the interval. The patient voices understanding of current disease status and treatment options and is in agreement with the current care plan.  All questions were answered. The patient knows to call the clinic with any problems, questions or concerns. We can certainly see the patient much sooner if necessary.  Disclaimer: This note was dictated with voice recognition software. Similar sounding words can inadvertently be transcribed and may not be corrected upon review.

## 2015-07-15 NOTE — Patient Instructions (Signed)
[facility] Discharge Instructions for Patients Receiving Chemotherapy  Today you received the following chemotherapy agents: Keytruda   To help prevent nausea and vomiting after your treatment, we encourage you to take your nausea medication as directed    If you develop nausea and vomiting that is not controlled by your nausea medication, call the clinic.   BELOW ARE SYMPTOMS THAT SHOULD BE REPORTED IMMEDIATELY:  *FEVER GREATER THAN 100.5 F  *CHILLS WITH OR WITHOUT FEVER  NAUSEA AND VOMITING THAT IS NOT CONTROLLED WITH YOUR NAUSEA MEDICATION  *UNUSUAL SHORTNESS OF BREATH  *UNUSUAL BRUISING OR BLEEDING  TENDERNESS IN MOUTH AND THROAT WITH OR WITHOUT PRESENCE OF ULCERS  *URINARY PROBLEMS  *BOWEL PROBLEMS  UNUSUAL RASH Items with * indicate a potential emergency and should be followed up as soon as possible.  Feel free to call the clinic you have any questions or concerns. The clinic phone number is [phone].  Please show the CHEMO ALERT CARD at check-in to the Emergency Department and triage nurse.   

## 2015-07-21 ENCOUNTER — Ambulatory Visit (INDEPENDENT_AMBULATORY_CARE_PROVIDER_SITE_OTHER): Payer: BLUE CROSS/BLUE SHIELD | Admitting: Thoracic Surgery (Cardiothoracic Vascular Surgery)

## 2015-07-21 ENCOUNTER — Encounter: Payer: Self-pay | Admitting: Thoracic Surgery (Cardiothoracic Vascular Surgery)

## 2015-07-21 VITALS — BP 126/73 | HR 86 | Resp 20 | Ht 64.0 in | Wt 138.0 lb

## 2015-07-21 DIAGNOSIS — Z902 Acquired absence of lung [part of]: Secondary | ICD-10-CM | POA: Diagnosis not present

## 2015-07-21 DIAGNOSIS — C3432 Malignant neoplasm of lower lobe, left bronchus or lung: Secondary | ICD-10-CM

## 2015-07-21 DIAGNOSIS — R911 Solitary pulmonary nodule: Secondary | ICD-10-CM

## 2015-07-21 NOTE — Progress Notes (Signed)
[address]       [place] [address]             [phone]       HPI: [name] returns today for a scheduled follow-up visit.  She is a 55 year old woman who had a thoracoscopic left lower lobectomy in March 2016 for a stage IB non-small cell carcinoma. She did not require adjuvant therapy initially, but had an early recurrence. Her tumor had a greater than 60% PDL-1 expression and she is being treated with Keytruda. She had follow-up scans a week ago.  She is no longer having any incisional pain. She does complain of fatigue and nausea. She is not having any respiratory symptoms. She continues to smoke.  Past Medical History  Diagnosis Date  . Ischemic cardiomyopathy   . Hyperlipidemia   . Tobacco user   . Coronary artery disease   . Occlusion of right coronary artery ([place])   . Aortic insufficiency     moderate by echo in 2009  . Acute ST segment elevation MI ([place])   . Neoplasm of uncertain behavior of left lower lobe of lung     per CT CHEST/PET 2/4 and 07/18/14 @ [facility]  . S/P coronary artery stent placement     2009  . COPD (chronic obstructive pulmonary disease) ([place])   . Emphysema of lung ([place])   . Pneumonia     2015   HX BRONCHITIS    . Shortness of breath dyspnea     WITH EXEERTION   . Arthritis   . Cancer [facility])     LUNGS       Current Outpatient Prescriptions  Medication Sig Dispense Refill  . alendronate (FOSAMAX) 70 MG tablet Take 70 mg by mouth once a week.  0  . ALPRAZolam (XANAX) 0.25 MG tablet Take 0.25 mg by mouth 2 (two) times daily as needed for anxiety.    Marland Kitchen aspirin (ASPIR-LOW) 81 MG EC tablet Take 81 mg by mouth daily. On hold    . clopidogrel (PLAVIX) 75 MG tablet Take 1 tablet (75 mg total) by mouth daily. 90 tablet 3  . ondansetron (ZOFRAN) 8 MG tablet Take 8 mg by mouth every 8 (eight) hours as needed for nausea or vomiting.    . rosuvastatin (CRESTOR) 10 MG tablet Take 1 tablet (10 mg total) by mouth daily. 90  tablet 3  . traMADol (ULTRAM) 50 MG tablet Take 1 tablet (50 mg total) by mouth every 6 (six) hours as needed. 60 tablet 0  . Vitamin D, Ergocalciferol, (DRISDOL) 50000 UNITS CAPS capsule Take 50,000 Units by mouth every 7 (seven) days.     No current facility-administered medications for this visit.    Physical Exam BP 126/73 mmHg  Pulse 86  Resp 20  Ht 5' 4"  (1.626 m)  Wt 138 lb (62.596 kg)  BMI 23.68 kg/m2  SpO6 32% 55 year old woman in no acute distress No cervical or supraclavicular adenopathy Lungs slightly diminished at left base, otherwise clear Cardiac regular rate and rhythm normal S1 and S2 Incisions well healed  Diagnostic Tests: CT CHEST, ABDOMEN, AND PELVIS WITH CONTRAST  TECHNIQUE: Multidetector CT imaging of the chest, abdomen and pelvis was performed following the standard protocol during bolus administration of intravenous contrast.  CONTRAST: 147m OMNIPAQUE IOHEXOL 300 MG/ML SOLN  COMPARISON: PET-CT on 04/30/2015  FINDINGS: CT CHEST FINDINGS  Mediastinum/Lymph Nodes: Mild mediastinal lymphadenopathy in the high right paratracheal and lateral aortic regions show  no significant change, with index lymph node measuring 11 mm on image 24/series 2. Previously seen hypermetabolic soft tissue density in the left cardiophrenic angle and adjacent to the GE junction has nearly completely resolved since prior exam.  No new sites of lymphadenopathy identified within the thorax. No evidence of pericardial effusion.  Lungs/Pleura: Tiny left pleural effusion has decreased in size since previous study. Previously seen small pulmonary nodules in the left lung base have nearly completely resolved and are no longer measurable. Mild emphysema again noted. A 3 mm pulmonary nodule is seen in the peripheral right upper lobe on image 17/series 6 which was not seen previously but is too small to characterize. Right lower lobe scarring remains  stable.  Musculoskeletal: No chest wall mass or suspicious bone lesions identified.  CT ABDOMEN PELVIS FINDINGS  Hepatobiliary: No masses or other significant abnormality. Prior cholecystectomy noted. No evidence of biliary dilatation.  Pancreas: No mass, inflammatory changes, or other significant abnormality.  Spleen: Within normal limits in size and appearance.  Adrenals/Urinary Tract: No masses identified. No evidence of hydronephrosis. Small amount of gas noted in urinary bladder, likely due to recent catheterization/instrumentation.  Stomach/Bowel: No evidence of obstruction, inflammatory process, or abnormal fluid collections.  Vascular/Lymphatic: No pathologically enlarged lymph nodes. No evidence of abdominal aortic aneurysm. Aortic atherosclerotic plaque noted.  Reproductive: No mass or other significant abnormality.  Other: None.  Musculoskeletal: No suspicious bone lesions identified.  IMPRESSION: Partial interval response to therapy, with decrease in mediastinal lymphadenopathy, left lower lobe pulmonary nodules, and small left pleural effusion.  New indeterminate 3 mm right upper lobe pulmonary nodule. Recommend attention on follow-up imaging.  No evidence of metastatic disease or other acute findings within the abdomen or pelvis.   Electronically Signed  By: Earle Gell M.D.  On: 07/14/2015 09:44  I personally reviewed her CT chest abdomen and pelvis and concur with the findings as noted above  Impression: 55 year old woman who had a thoracoscopic left lower lobectomy for stage IB non-small cell carcinoma. She had an early recurrence. She is currently being treated with Keytruda. She is tolerating that well although she does have some fatigue and nausea. Her CT scan from weeks ago showed partial response in her mediastinal nodes and good response elsewhere.  Her incisional pain has resolved.  She is being followed closely by Dr.  Julien Nordmann.   Plan: Follow-up is scheduled with Dr. Julien Nordmann.  I will be happy to see her back any time if I can be of any further assistance with her care.  Melrose Nakayama, MD [facility] [phone]

## 2015-07-27 ENCOUNTER — Telehealth: Payer: Self-pay | Admitting: Internal Medicine

## 2015-07-27 NOTE — Telephone Encounter (Signed)
R/s appt to the correct provider. Spoke with patient to confirm date/time

## 2015-08-05 ENCOUNTER — Ambulatory Visit: Payer: BLUE CROSS/BLUE SHIELD

## 2015-08-05 ENCOUNTER — Ambulatory Visit: Payer: BLUE CROSS/BLUE SHIELD | Admitting: Hematology

## 2015-08-05 ENCOUNTER — Other Ambulatory Visit: Payer: BLUE CROSS/BLUE SHIELD

## 2015-08-06 ENCOUNTER — Other Ambulatory Visit (HOSPITAL_BASED_OUTPATIENT_CLINIC_OR_DEPARTMENT_OTHER): Payer: BLUE CROSS/BLUE SHIELD

## 2015-08-06 ENCOUNTER — Ambulatory Visit (HOSPITAL_BASED_OUTPATIENT_CLINIC_OR_DEPARTMENT_OTHER): Payer: BLUE CROSS/BLUE SHIELD

## 2015-08-06 ENCOUNTER — Ambulatory Visit (HOSPITAL_BASED_OUTPATIENT_CLINIC_OR_DEPARTMENT_OTHER): Payer: BLUE CROSS/BLUE SHIELD | Admitting: Nurse Practitioner

## 2015-08-06 VITALS — BP 133/83 | HR 80 | Temp 98.2°F | Resp 18 | Ht 64.0 in | Wt 142.3 lb

## 2015-08-06 DIAGNOSIS — Z5112 Encounter for antineoplastic immunotherapy: Secondary | ICD-10-CM | POA: Diagnosis not present

## 2015-08-06 DIAGNOSIS — R911 Solitary pulmonary nodule: Secondary | ICD-10-CM | POA: Diagnosis not present

## 2015-08-06 DIAGNOSIS — C3432 Malignant neoplasm of lower lobe, left bronchus or lung: Secondary | ICD-10-CM | POA: Diagnosis not present

## 2015-08-06 DIAGNOSIS — J90 Pleural effusion, not elsewhere classified: Secondary | ICD-10-CM | POA: Diagnosis not present

## 2015-08-06 DIAGNOSIS — R05 Cough: Secondary | ICD-10-CM

## 2015-08-06 DIAGNOSIS — C3412 Malignant neoplasm of upper lobe, left bronchus or lung: Secondary | ICD-10-CM

## 2015-08-06 DIAGNOSIS — R110 Nausea: Secondary | ICD-10-CM | POA: Diagnosis not present

## 2015-08-06 LAB — COMPREHENSIVE METABOLIC PANEL
ALT: 12 U/L (ref 0–55)
ANION GAP: 10 meq/L (ref 3–11)
AST: 24 U/L (ref 5–34)
Albumin: 3.5 g/dL (ref 3.5–5.0)
Alkaline Phosphatase: 82 U/L (ref 40–150)
BUN: 7.8 mg/dL (ref 7.0–26.0)
CALCIUM: 9.3 mg/dL (ref 8.4–10.4)
CHLORIDE: 109 meq/L (ref 98–109)
CO2: 21 meq/L — AB (ref 22–29)
Creatinine: 0.8 mg/dL (ref 0.6–1.1)
EGFR: 82 mL/min/{1.73_m2} — ABNORMAL LOW (ref >90)
Glucose: 127 mg/dl (ref 70–140)
POTASSIUM: 3.5 meq/L (ref 3.5–5.1)
Sodium: 140 mEq/L (ref 136–145)
Total Bilirubin: 0.32 mg/dL (ref 0.20–1.20)
Total Protein: 7.1 g/dL (ref 6.4–8.3)

## 2015-08-06 LAB — CBC WITH DIFFERENTIAL/PLATELET
BASO%: 1.1 % (ref 0.0–2.0)
BASOS ABS: 0.1 10*3/uL (ref 0.0–0.1)
EOS%: 1.5 % (ref 0.0–7.0)
Eosinophils Absolute: 0.1 10*3/uL (ref 0.0–0.5)
HEMATOCRIT: 45.3 % (ref 34.8–46.6)
HGB: 15.1 g/dL (ref 11.6–15.9)
LYMPH#: 2.3 10*3/uL (ref 0.9–3.3)
LYMPH%: 26.9 % (ref 14.0–49.7)
MCH: 31.1 pg (ref 25.1–34.0)
MCHC: 33.3 g/dL (ref 31.5–36.0)
MCV: 93.5 fL (ref 79.5–101.0)
MONO#: 0.3 10*3/uL (ref 0.1–0.9)
MONO%: 3 % (ref 0.0–14.0)
NEUT#: 5.7 10*3/uL (ref 1.5–6.5)
NEUT%: 67.5 % (ref 38.4–76.8)
PLATELETS: 229 10*3/uL (ref 145–400)
RBC: 4.85 10*6/uL (ref 3.70–5.45)
RDW: 14.8 % — ABNORMAL HIGH (ref 11.2–14.5)
WBC: 8.4 10*3/uL (ref 3.9–10.3)

## 2015-08-06 MED ORDER — SODIUM CHLORIDE 0.9 % IV SOLN
Freq: Once | INTRAVENOUS | Status: AC
  Administered 2015-08-06: 11:00:00 via INTRAVENOUS

## 2015-08-06 MED ORDER — SODIUM CHLORIDE 0.9 % IV SOLN
200.0000 mg | Freq: Once | INTRAVENOUS | Status: AC
  Administered 2015-08-06: 200 mg via INTRAVENOUS
  Filled 2015-08-06: qty 8

## 2015-08-06 MED ORDER — ONDANSETRON HCL 8 MG PO TABS: 8.0000 mg | ORAL_TABLET | Freq: Three times a day (TID) | ORAL | Status: DC | PRN

## 2015-08-06 NOTE — Patient Instructions (Signed)
[facility] Discharge Instructions for Patients Receiving Chemotherapy  Today you received the following chemotherapy agent, Keytruda.  To help prevent nausea and vomiting after your treatment, we encourage you to take your nausea medication as prescribed.    If you develop nausea and vomiting that is not controlled by your nausea medication, call the clinic.   BELOW ARE SYMPTOMS THAT SHOULD BE REPORTED IMMEDIATELY:  *FEVER GREATER THAN 100.5 F  *CHILLS WITH OR WITHOUT FEVER  NAUSEA AND VOMITING THAT IS NOT CONTROLLED WITH YOUR NAUSEA MEDICATION  *UNUSUAL SHORTNESS OF BREATH  *UNUSUAL BRUISING OR BLEEDING  TENDERNESS IN MOUTH AND THROAT WITH OR WITHOUT PRESENCE OF ULCERS  *URINARY PROBLEMS  *BOWEL PROBLEMS  UNUSUAL RASH Items with * indicate a potential emergency and should be followed up as soon as possible.  Feel free to call the clinic you have any questions or concerns. The clinic phone number is [phone].  Please show the [facility] at check-in to the Emergency Department and triage nurse.

## 2015-08-06 NOTE — Progress Notes (Signed)
  [facility] OFFICE PROGRESS NOTE    DIAGNOSIS: Recurrent non-small cell lung cancer, adenocarcinoma with PDL 1 expression 60% diagnosed in November 2016. This was initially diagnosed as Stage IB (T2a, N0, M0) non-small cell lung cancer, adenocarcinoma diagnosed in March 2016.  PRIOR THERAPY: Status post left video-assisted thoracoscopy, wedge resection left lower lobe, thoracoscopic left lower lobectomy and mediastinal lymph node dissection on 08/14/2014.   CURRENT THERAPY: Keytruda 200 mg IV every 3 weeks, first dose 05/12/2015. Status post 4 cycles.  INTERVAL HISTORY:   Sharon [name] returns as scheduled. She completed cycle 4 Pembrolizumab on 07/15/2015. She continues to have mild intermittent nausea. No vomiting. No mouth sores. No diarrhea. No rash. She is periodically "itchy" in various locations. No shortness of breath. Stable chronic cough. Her daughter attributes the cough to smoking. No fever. She notes occasional left-sided chest discomfort with lifting.  Objective:  Vital signs in last 24 hours:  Blood pressure 133/83, pulse 80, temperature 98.2 F (36.8 C), temperature source Oral, resp. rate 18, height '5\' 4"'$  (1.626 m), weight 142 lb 4.8 oz (64.547 kg), SpO2 97 %.    HEENT: No thrush or ulcers. Resp: Lungs clear bilaterally. Cardio: Regular rate and rhythm. GI: Abdomen soft and nontender. No hepatomegaly. Vascular: No leg edema. Calves soft and nontender. Neuro: Alert and oriented.  Skin: No rash.    Lab Results:  Lab Results  Component Value Date   WBC 8.4 08/06/2015   HGB 15.1 08/06/2015   HCT 45.3 08/06/2015   MCV 93.5 08/06/2015   PLT 229 08/06/2015   NEUTROABS 5.7 08/06/2015    Imaging:  No results found.  Medications: I have reviewed the patient's current medications.  Assessment/Plan: 1. Recurrent/metastatic non-small cell lung cancer, adenocarcinoma diagnosed November 2016, initially stage IB non-small cell lung cancer, status post  left lower lobectomy with mediastinal lymph node dissection with evidence of pleural invasion. Found to have disease recurrence on chest CT 03/26/2015; status post biopsy of a left lower lobe pleural-based nodule 04/30/2015 with pathology consistent with poorly differentiated adenocarcinoma. Treatment initiated with Pembrolizumab 05/12/2015. Restaging CT evaluation after 3 cycles showed improvement in mediastinal lymphadenopathy, left lower lobe pulmonary nodules and a small left pleural effusion. There was a new indeterminate 3 mm right upper lobe pulmonary nodule. Pembrolizumab continued with plans to monitor the new right upper lobe nodule.   Disposition: Sharon [name] appears stable. She has completed 4 cycles of pembrolizumab. Plan to proceed with cycle 5 today as scheduled. She will return for a follow-up visit and cycle 6 in 3 weeks. She will contact the office in the interim with any problems.    Ned Card ANP/GNP-BC   08/06/2015  10:13 AM

## 2015-08-10 ENCOUNTER — Ambulatory Visit: Payer: BLUE CROSS/BLUE SHIELD | Admitting: Internal Medicine

## 2015-08-10 ENCOUNTER — Other Ambulatory Visit: Payer: BLUE CROSS/BLUE SHIELD

## 2015-08-10 ENCOUNTER — Ambulatory Visit: Payer: BLUE CROSS/BLUE SHIELD

## 2015-08-13 ENCOUNTER — Other Ambulatory Visit: Payer: Self-pay | Admitting: Obstetrics & Gynecology

## 2015-08-17 LAB — CYTOLOGY - PAP

## 2015-08-24 ENCOUNTER — Ambulatory Visit: Payer: BLUE CROSS/BLUE SHIELD

## 2015-08-24 ENCOUNTER — Ambulatory Visit: Payer: BLUE CROSS/BLUE SHIELD | Admitting: Internal Medicine

## 2015-08-24 ENCOUNTER — Other Ambulatory Visit: Payer: BLUE CROSS/BLUE SHIELD

## 2015-08-26 ENCOUNTER — Ambulatory Visit (HOSPITAL_BASED_OUTPATIENT_CLINIC_OR_DEPARTMENT_OTHER): Payer: BLUE CROSS/BLUE SHIELD | Admitting: Internal Medicine

## 2015-08-26 ENCOUNTER — Ambulatory Visit (HOSPITAL_BASED_OUTPATIENT_CLINIC_OR_DEPARTMENT_OTHER): Payer: BLUE CROSS/BLUE SHIELD

## 2015-08-26 ENCOUNTER — Ambulatory Visit: Payer: BLUE CROSS/BLUE SHIELD | Admitting: Hematology

## 2015-08-26 ENCOUNTER — Encounter: Payer: Self-pay | Admitting: Internal Medicine

## 2015-08-26 ENCOUNTER — Telehealth: Payer: Self-pay | Admitting: Internal Medicine

## 2015-08-26 ENCOUNTER — Other Ambulatory Visit: Payer: BLUE CROSS/BLUE SHIELD

## 2015-08-26 ENCOUNTER — Other Ambulatory Visit (HOSPITAL_BASED_OUTPATIENT_CLINIC_OR_DEPARTMENT_OTHER): Payer: BLUE CROSS/BLUE SHIELD

## 2015-08-26 ENCOUNTER — Ambulatory Visit: Payer: BLUE CROSS/BLUE SHIELD

## 2015-08-26 VITALS — BP 140/59 | HR 63 | Temp 97.4°F | Resp 17 | Ht 64.0 in | Wt 146.4 lb

## 2015-08-26 DIAGNOSIS — E039 Hypothyroidism, unspecified: Secondary | ICD-10-CM | POA: Insufficient documentation

## 2015-08-26 DIAGNOSIS — J90 Pleural effusion, not elsewhere classified: Secondary | ICD-10-CM | POA: Diagnosis not present

## 2015-08-26 DIAGNOSIS — Z79899 Other long term (current) drug therapy: Secondary | ICD-10-CM

## 2015-08-26 DIAGNOSIS — M799 Soft tissue disorder, unspecified: Secondary | ICD-10-CM | POA: Diagnosis not present

## 2015-08-26 DIAGNOSIS — C3412 Malignant neoplasm of upper lobe, left bronchus or lung: Secondary | ICD-10-CM

## 2015-08-26 DIAGNOSIS — Z5112 Encounter for antineoplastic immunotherapy: Secondary | ICD-10-CM

## 2015-08-26 DIAGNOSIS — Z5111 Encounter for antineoplastic chemotherapy: Secondary | ICD-10-CM

## 2015-08-26 DIAGNOSIS — R911 Solitary pulmonary nodule: Secondary | ICD-10-CM | POA: Diagnosis not present

## 2015-08-26 DIAGNOSIS — C3432 Malignant neoplasm of lower lobe, left bronchus or lung: Secondary | ICD-10-CM

## 2015-08-26 DIAGNOSIS — Z720 Tobacco use: Secondary | ICD-10-CM

## 2015-08-26 DIAGNOSIS — R5383 Other fatigue: Secondary | ICD-10-CM

## 2015-08-26 DIAGNOSIS — R599 Enlarged lymph nodes, unspecified: Secondary | ICD-10-CM

## 2015-08-26 LAB — COMPREHENSIVE METABOLIC PANEL
ALK PHOS: 77 U/L (ref 40–150)
ALT: 24 U/L (ref 0–55)
AST: 51 U/L — AB (ref 5–34)
Albumin: 3.8 g/dL (ref 3.5–5.0)
Anion Gap: 10 mEq/L (ref 3–11)
BUN: 8.6 mg/dL (ref 7.0–26.0)
CO2: 23 mEq/L (ref 22–29)
Calcium: 9.1 mg/dL (ref 8.4–10.4)
Chloride: 107 mEq/L (ref 98–109)
Creatinine: 0.9 mg/dL (ref 0.6–1.1)
EGFR: 70 mL/min/{1.73_m2} — ABNORMAL LOW (ref >90)
Glucose: 113 mg/dl (ref 70–140)
POTASSIUM: 3.5 meq/L (ref 3.5–5.1)
SODIUM: 140 meq/L (ref 136–145)
Total Bilirubin: 0.49 mg/dL (ref 0.20–1.20)
Total Protein: 7.6 g/dL (ref 6.4–8.3)

## 2015-08-26 LAB — CBC WITH DIFFERENTIAL/PLATELET
BASO%: 1.4 % (ref 0.0–2.0)
BASOS ABS: 0.1 10*3/uL (ref 0.0–0.1)
EOS ABS: 0.1 10*3/uL (ref 0.0–0.5)
EOS%: 1.7 % (ref 0.0–7.0)
HEMATOCRIT: 45.7 % (ref 34.8–46.6)
HGB: 15.1 g/dL (ref 11.6–15.9)
LYMPH%: 30.4 % (ref 14.0–49.7)
MCH: 31.5 pg (ref 25.1–34.0)
MCHC: 33.1 g/dL (ref 31.5–36.0)
MCV: 95.1 fL (ref 79.5–101.0)
MONO#: 0.3 10*3/uL (ref 0.1–0.9)
MONO%: 4.8 % (ref 0.0–14.0)
NEUT#: 4.3 10*3/uL (ref 1.5–6.5)
NEUT%: 61.7 % (ref 38.4–76.8)
PLATELETS: 208 10*3/uL (ref 145–400)
RBC: 4.81 10*6/uL (ref 3.70–5.45)
RDW: 16.2 % — ABNORMAL HIGH (ref 11.2–14.5)
WBC: 6.9 10*3/uL (ref 3.9–10.3)
lymph#: 2.1 10*3/uL (ref 0.9–3.3)

## 2015-08-26 LAB — TSH: TSH: 52.172 m(IU)/L — ABNORMAL HIGH (ref 0.308–3.960)

## 2015-08-26 MED ORDER — SODIUM CHLORIDE 0.9 % IV SOLN
200.0000 mg | Freq: Once | INTRAVENOUS | Status: AC
  Administered 2015-08-26: 200 mg via INTRAVENOUS
  Filled 2015-08-26: qty 8

## 2015-08-26 MED ORDER — SODIUM CHLORIDE 0.9 % IV SOLN
Freq: Once | INTRAVENOUS | Status: AC
  Administered 2015-08-26: 14:00:00 via INTRAVENOUS

## 2015-08-26 MED ORDER — LEVOTHYROXINE SODIUM 50 MCG PO TABS
50.0000 ug | ORAL_TABLET | Freq: Every day | ORAL | Status: DC
Start: 2015-08-26 — End: 2015-08-27

## 2015-08-26 NOTE — Patient Instructions (Signed)
[facility] Discharge Instructions for Patients Receiving Chemotherapy  Today you received the following chemotherapy agents: Keytruda   To help prevent nausea and vomiting after your treatment, we encourage you to take your nausea medication as directed    If you develop nausea and vomiting that is not controlled by your nausea medication, call the clinic.   BELOW ARE SYMPTOMS THAT SHOULD BE REPORTED IMMEDIATELY:  *FEVER GREATER THAN 100.5 F  *CHILLS WITH OR WITHOUT FEVER  NAUSEA AND VOMITING THAT IS NOT CONTROLLED WITH YOUR NAUSEA MEDICATION  *UNUSUAL SHORTNESS OF BREATH  *UNUSUAL BRUISING OR BLEEDING  TENDERNESS IN MOUTH AND THROAT WITH OR WITHOUT PRESENCE OF ULCERS  *URINARY PROBLEMS  *BOWEL PROBLEMS  UNUSUAL RASH Items with * indicate a potential emergency and should be followed up as soon as possible.  Feel free to call the clinic you have any questions or concerns. The clinic phone number is [phone].  Please show the CHEMO ALERT CARD at check-in to the Emergency Department and triage nurse.   

## 2015-08-26 NOTE — Progress Notes (Signed)
Sharon [name] [phone]   [phone]  OFFICE PROGRESS NOTE  Sharon Riches, NP [address] [place] [address]  DIAGNOSIS: Recurrent non-small cell lung cancer, adenocarcinoma with PDL 1 expression 60% diagnosed in November 2016. This was initially diagnosed as Stage IB (T2a, N0, M0) non-small cell lung cancer, adenocarcinoma diagnosed in March 2016.  PRIOR THERAPY: Status post left video-assisted thoracoscopy, wedge resection left lower lobe, thoracoscopic left lower lobectomy and mediastinal lymph node dissection on 08/14/2014.   CURRENT THERAPY: Ketruda 200 mg IV every 3 weeks, first dose expected on 05/12/2015. Status post 5 cycles.  INTERVAL HISTORY: Sharon [name] 55 y.o. female returns to the clinic today for follow-up visit accompanied by her daughter. The patient is feeling fine today except for fatigue and occasional nausea. Her nausea improved with Zofran. She tolerated the last cycle of her treatment with immunotherapy with [place] fairly well. She denied having any significant skin rash or diarrhea. She has no chest pain, shortness breath, cough or hemoptysis. She has no fever or chills. She is here today for evaluation before starting cycle #6 of her treatment with [place].  MEDICAL HISTORY: Past Medical History  Diagnosis Date  . Ischemic cardiomyopathy   . Hyperlipidemia   . Tobacco user   . Coronary artery disease   . Occlusion of right coronary artery ([place])   . Aortic insufficiency     moderate by echo in 2009  . Acute ST segment elevation MI ([place])   . Neoplasm of uncertain behavior of left lower lobe of lung     per CT CHEST/PET 2/4 and 07/18/14 @ [facility]  . S/P coronary artery stent placement     2009  . COPD (chronic obstructive pulmonary disease) ([place])   . Emphysema of lung ([place])   . Pneumonia     2015   HX BRONCHITIS    . Shortness of breath dyspnea     WITH EXEERTION   . Arthritis   . Cancer ([place])     LUNGS     ALLERGIES:  is allergic to codeine; lipitor; morphine and related; and oxycodone-acetaminophen.  MEDICATIONS:  Current Outpatient Prescriptions  Medication Sig Dispense Refill  . alendronate (FOSAMAX) 70 MG tablet Take 70 mg by mouth once a week.  0  . ALPRAZolam (XANAX) 0.25 MG tablet Take 0.25 mg by mouth 2 (two) times daily as needed for anxiety.    Marland Kitchen aspirin (ASPIR-LOW) 81 MG EC tablet Take 81 mg by mouth daily. On hold    . clopidogrel (PLAVIX) 75 MG tablet Take 1 tablet (75 mg total) by mouth daily. 90 tablet 3  . ondansetron (ZOFRAN) 8 MG tablet Take 1 tablet (8 mg total) by mouth every 8 (eight) hours as needed for nausea or vomiting. 20 tablet 0  . rosuvastatin (CRESTOR) 10 MG tablet Take 1 tablet (10 mg total) by mouth daily. 90 tablet 3  . traMADol (ULTRAM) 50 MG tablet Take 1 tablet (50 mg total) by mouth every 6 (six) hours as needed. 60 tablet 0  . Vitamin D, Ergocalciferol, (DRISDOL) 50000 UNITS CAPS capsule Take 50,000 Units by mouth every 7 (seven) days.     No current facility-administered medications for this visit.    SURGICAL HISTORY:  Past Surgical History  Procedure Laterality Date  . Cholecystectomy    . Tubal ligation    . Ectopic pregnancies requiring laparotomies    . Cardiac catheterization  10/15/2007    EF 30-40%  .  Coronary angioplasty with stent placement      LAD  . US echocardiography  01/07/2008    EF 55-60%  . Transthoracic echocardiogram  10/17/2007  . Cesarean section    . Shoulder surgery      LEFT X 2   (REMOVED SOME COLLAR BONE )  . Video assisted thoracoscopy (vats)/wedge resection Left 08/14/2014    Procedure: VIDEO ASSISTED THORACOSCOPY (VATS)/WEDGE RESECTION;  Surgeon: Melrose Nakayama, MD;  Location: [facility];  Service: Thoracic;  Laterality: Left;  . Lobectomy Left 08/14/2014    Procedure: LEFT LOWER LOBECTOMY;  Surgeon: Melrose Nakayama, MD;  Location: [facility];  Service: Thoracic;  Laterality: Left;  . Lead removal Left  08/14/2014    Procedure: CRYO INTERCOSTAL NERVE BLOCK;  Surgeon: Melrose Nakayama, MD;  Location: [facility];  Service: Thoracic;  Laterality: Left;  . Node dissection Left 08/14/2014    Procedure: NODE DISSECTION, LEFT LOWER LOBE LUNG;  Surgeon: Melrose Nakayama, MD;  Location: [facility];  Service: Thoracic;  Laterality: Left;    REVIEW OF SYSTEMS:  Constitutional: positive for fatigue Eyes: negative Ears, nose, mouth, throat, and face: negative Respiratory: negative Cardiovascular: negative Gastrointestinal: positive for nausea Genitourinary:negative Integument/breast: negative Hematologic/lymphatic: negative Musculoskeletal:negative Neurological: negative Behavioral/Psych: negative Endocrine: negative Allergic/Immunologic: negative   PHYSICAL EXAMINATION: General appearance: alert, cooperative and no distress Head: Normocephalic, without obvious abnormality, atraumatic Neck: no adenopathy, no JVD, supple, symmetrical, trachea midline and thyroid not enlarged, symmetric, no tenderness/mass/nodules Lymph nodes: Cervical, supraclavicular, and axillary nodes normal. Resp: clear to auscultation bilaterally Back: symmetric, no curvature. ROM normal. No CVA tenderness. Cardio: regular rate and rhythm, S1, S2 normal, no murmur, click, rub or gallop GI: soft, non-tender; bowel sounds normal; no masses,  no organomegaly Extremities: extremities normal, atraumatic, no cyanosis or edema Neurologic: Alert and oriented X 3, normal strength and tone. Normal symmetric reflexes. Normal coordination and gait  ECOG PERFORMANCE STATUS: 1 - Symptomatic but completely ambulatory  Blood pressure 140/59, pulse 63, temperature 97.4 F (36.3 C), temperature source Oral, resp. rate 17, height '5\' 4"'$  (1.626 m), weight 146 lb 6.4 oz (66.407 kg), SpO2 96 %.  LABORATORY DATA: Lab Results  Component Value Date   WBC 6.9 08/26/2015   HGB 15.1 08/26/2015   HCT 45.7 08/26/2015   MCV 95.1 08/26/2015   PLT 208  08/26/2015      Chemistry      Component Value Date/Time   NA 140 08/26/2015 1136   NA 138 08/16/2014 0520   K 3.5 08/26/2015 1136   K 3.9 08/16/2014 0520   CL 109 08/16/2014 0520   CO2 23 08/26/2015 1136   CO2 25 08/16/2014 0520   BUN 8.6 08/26/2015 1136   BUN 8 08/16/2014 0520   CREATININE 0.9 08/26/2015 1136   CREATININE 0.59 08/16/2014 0520      Component Value Date/Time   CALCIUM 9.1 08/26/2015 1136   CALCIUM 8.1* 08/16/2014 0520   ALKPHOS 77 08/26/2015 1136   ALKPHOS 53 08/16/2014 0520   AST 51* 08/26/2015 1136   AST 27 08/16/2014 0520   ALT 24 08/26/2015 1136   ALT 20 08/16/2014 0520   BILITOT 0.49 08/26/2015 1136   BILITOT 0.7 08/16/2014 0520       RADIOGRAPHIC STUDIES: No results found.  ASSESSMENT AND PLAN: This is a very pleasant 55 years old white female with history of recurrent/metastatic non-small cell lung cancer , adenocarcinoma in November 2016 initially diagnosed as stage IB non-small cell lung cancer status post left  lower lobectomy with mediastinal lymph node dissection with evidence of pleural invasion and has been on observation. Unfortunately the patient has evidence for disease recurrence and the recent CT scan of the chest showed new small left pleural effusion as well as new pleural based nodules worrisome for transpleural spread of tumor concerning for disease recurrence. The PET scan performed recently shows also numerous new small hypermetabolic soft tissue nodules throughout the left pleural space and small tiny left pleural effusion as well as hypermetabolic mediastinal lymphadenopathy. The patient is currently on treatment with immunotherapy with Ketruda (pembrolizumab) status post 5 cycles. She is tolerating her treatment fairly well except for fatigue and occasional nausea. I recommended for the patient to proceed with cycle #6 today as a scheduled. 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 I will adjust her dose as needed based on the TSH. For smoke cessation, I strongly encouraged the patient to quit smoking and offered her to smoke cessation program. I will see the patient back for follow-up visit in 3 weeks for reevaluation with repeat CT scan of the chest, abdomen and pelvis for restaging of her disease before starting cycle #7. The patient was advised to call immediately if she has any concerning symptoms in the interval. The patient voices understanding of current disease status and treatment options and is in agreement with the current care plan.  All questions were answered. The patient knows to call the clinic with any problems, questions or concerns. We can certainly see the patient much sooner if necessary.  Disclaimer: This note was dictated with voice recognition software. Similar sounding words can inadvertently be transcribed and may not be corrected upon review.

## 2015-08-26 NOTE — Progress Notes (Signed)
OK to treat with TSH today per Dr. Julien Nordmann per [facility] pharmacist

## 2015-08-26 NOTE — Telephone Encounter (Signed)
per pof to sch pt appt-gave pt copy of avs °

## 2015-08-27 ENCOUNTER — Other Ambulatory Visit: Payer: Self-pay | Admitting: Medical Oncology

## 2015-08-27 ENCOUNTER — Telehealth: Payer: Self-pay | Admitting: *Deleted

## 2015-08-27 MED ORDER — LEVOTHYROXINE SODIUM 50 MCG PO TABS: 50.0000 ug | ORAL_TABLET | Freq: Every day | ORAL | Status: DC

## 2015-08-27 NOTE — Telephone Encounter (Signed)
"  There was a prescription to be called in to [facility] yesterday for me to pick up on my way home.  Will something be called in or was it sent to a different pharmacy?  My number is [phone]."

## 2015-08-27 NOTE — Telephone Encounter (Signed)
Synthroid was sent incorrectly  to [facility] yesterday so i called and cancelled it and sent rx to [facility]

## 2015-09-14 ENCOUNTER — Ambulatory Visit (HOSPITAL_COMMUNITY)
Admission: RE | Admit: 2015-09-14 | Discharge: 2015-09-14 | Disposition: A | Discharge status: Home / Self Care | Payer: BLUE CROSS/BLUE SHIELD | Source: Ambulatory Visit | Attending: Internal Medicine | Admitting: Internal Medicine

## 2015-09-14 ENCOUNTER — Encounter (HOSPITAL_COMMUNITY): Payer: Self-pay

## 2015-09-14 DIAGNOSIS — R911 Solitary pulmonary nodule: Secondary | ICD-10-CM | POA: Diagnosis not present

## 2015-09-14 DIAGNOSIS — C3432 Malignant neoplasm of lower lobe, left bronchus or lung: Secondary | ICD-10-CM | POA: Insufficient documentation

## 2015-09-14 DIAGNOSIS — I77819 Aortic ectasia, unspecified site: Secondary | ICD-10-CM | POA: Insufficient documentation

## 2015-09-14 DIAGNOSIS — C3412 Malignant neoplasm of upper lobe, left bronchus or lung: Secondary | ICD-10-CM | POA: Insufficient documentation

## 2015-09-14 DIAGNOSIS — Z9225 Personal history of immunosupression therapy: Secondary | ICD-10-CM | POA: Insufficient documentation

## 2015-09-14 DIAGNOSIS — I2510 Atherosclerotic heart disease of native coronary artery without angina pectoris: Secondary | ICD-10-CM | POA: Diagnosis not present

## 2015-09-14 DIAGNOSIS — Z5112 Encounter for antineoplastic immunotherapy: Secondary | ICD-10-CM

## 2015-09-14 MED ORDER — DIATRIZOATE MEGLUMINE & SODIUM 66-10 % PO SOLN
30.0000 mL | Freq: Once | ORAL | Status: AC
  Administered 2015-09-14: 30 mL via ORAL
  Filled 2015-09-14: qty 30

## 2015-09-14 MED ORDER — IOPAMIDOL (ISOVUE-300) INJECTION 61%
100.0000 mL | Freq: Once | INTRAVENOUS | Status: AC | PRN
  Administered 2015-09-14: 100 mL via INTRAVENOUS

## 2015-09-15 ENCOUNTER — Ambulatory Visit (HOSPITAL_BASED_OUTPATIENT_CLINIC_OR_DEPARTMENT_OTHER): Payer: BLUE CROSS/BLUE SHIELD

## 2015-09-15 ENCOUNTER — Ambulatory Visit (HOSPITAL_BASED_OUTPATIENT_CLINIC_OR_DEPARTMENT_OTHER): Payer: BLUE CROSS/BLUE SHIELD | Admitting: Internal Medicine

## 2015-09-15 ENCOUNTER — Encounter: Payer: Self-pay | Admitting: *Deleted

## 2015-09-15 ENCOUNTER — Encounter: Payer: Self-pay | Admitting: Internal Medicine

## 2015-09-15 ENCOUNTER — Other Ambulatory Visit (HOSPITAL_BASED_OUTPATIENT_CLINIC_OR_DEPARTMENT_OTHER): Payer: BLUE CROSS/BLUE SHIELD

## 2015-09-15 VITALS — BP 141/52 | HR 68 | Temp 98.3°F | Resp 18 | Ht 64.0 in | Wt 145.7 lb

## 2015-09-15 DIAGNOSIS — Z720 Tobacco use: Secondary | ICD-10-CM

## 2015-09-15 DIAGNOSIS — C3432 Malignant neoplasm of lower lobe, left bronchus or lung: Secondary | ICD-10-CM | POA: Diagnosis not present

## 2015-09-15 DIAGNOSIS — E039 Hypothyroidism, unspecified: Secondary | ICD-10-CM

## 2015-09-15 DIAGNOSIS — R911 Solitary pulmonary nodule: Secondary | ICD-10-CM

## 2015-09-15 DIAGNOSIS — Z5112 Encounter for antineoplastic immunotherapy: Secondary | ICD-10-CM

## 2015-09-15 DIAGNOSIS — R599 Enlarged lymph nodes, unspecified: Secondary | ICD-10-CM | POA: Diagnosis not present

## 2015-09-15 DIAGNOSIS — M799 Soft tissue disorder, unspecified: Secondary | ICD-10-CM

## 2015-09-15 DIAGNOSIS — C3412 Malignant neoplasm of upper lobe, left bronchus or lung: Secondary | ICD-10-CM

## 2015-09-15 DIAGNOSIS — J90 Pleural effusion, not elsewhere classified: Secondary | ICD-10-CM

## 2015-09-15 DIAGNOSIS — Z5111 Encounter for antineoplastic chemotherapy: Secondary | ICD-10-CM | POA: Diagnosis not present

## 2015-09-15 LAB — CBC WITH DIFFERENTIAL/PLATELET
BASO%: 0.6 % (ref 0.0–2.0)
BASOS ABS: 0 10*3/uL (ref 0.0–0.1)
EOS ABS: 0.1 10*3/uL (ref 0.0–0.5)
EOS%: 1.8 % (ref 0.0–7.0)
HEMATOCRIT: 44.6 % (ref 34.8–46.6)
HGB: 15.1 g/dL (ref 11.6–15.9)
LYMPH#: 2.1 10*3/uL (ref 0.9–3.3)
LYMPH%: 32.6 % (ref 14.0–49.7)
MCH: 32.3 pg (ref 25.1–34.0)
MCHC: 33.9 g/dL (ref 31.5–36.0)
MCV: 95.3 fL (ref 79.5–101.0)
MONO#: 0.4 10*3/uL (ref 0.1–0.9)
MONO%: 6.6 % (ref 0.0–14.0)
NEUT#: 3.8 10*3/uL (ref 1.5–6.5)
NEUT%: 58.4 % (ref 38.4–76.8)
Platelets: 180 10*3/uL (ref 145–400)
RBC: 4.68 10*6/uL (ref 3.70–5.45)
RDW: 17.2 % — ABNORMAL HIGH (ref 11.2–14.5)
WBC: 6.5 10*3/uL (ref 3.9–10.3)

## 2015-09-15 LAB — COMPREHENSIVE METABOLIC PANEL
ALT: 22 U/L (ref 0–55)
ANION GAP: 8 meq/L (ref 3–11)
AST: 40 U/L — ABNORMAL HIGH (ref 5–34)
Albumin: 3.9 g/dL (ref 3.5–5.0)
Alkaline Phosphatase: 88 U/L (ref 40–150)
BUN: 7.4 mg/dL (ref 7.0–26.0)
CALCIUM: 9.8 mg/dL (ref 8.4–10.4)
CHLORIDE: 109 meq/L (ref 98–109)
CO2: 25 meq/L (ref 22–29)
CREATININE: 0.9 mg/dL (ref 0.6–1.1)
EGFR: 77 mL/min/{1.73_m2} — AB (ref >90)
Glucose: 79 mg/dl (ref 70–140)
Potassium: 4.4 mEq/L (ref 3.5–5.1)
Sodium: 142 mEq/L (ref 136–145)
Total Bilirubin: 0.34 mg/dL (ref 0.20–1.20)
Total Protein: 7.7 g/dL (ref 6.4–8.3)

## 2015-09-15 MED ORDER — SODIUM CHLORIDE 0.9 % IV SOLN
200.0000 mg | Freq: Once | INTRAVENOUS | Status: AC
  Administered 2015-09-15: 200 mg via INTRAVENOUS
  Filled 2015-09-15: qty 8

## 2015-09-15 MED ORDER — SODIUM CHLORIDE 0.9 % IV SOLN
Freq: Once | INTRAVENOUS | Status: AC
  Administered 2015-09-15: 13:00:00 via INTRAVENOUS

## 2015-09-15 NOTE — Progress Notes (Signed)
[place] [phone]   [phone]  OFFICE PROGRESS NOTE  [name], NP [address] [place] [address]  DIAGNOSIS: Recurrent non-small cell lung cancer, adenocarcinoma with PDL 1 expression 60% diagnosed in November 2016. This was initially diagnosed as Stage IB (T2a, N0, M0) non-small cell lung cancer, adenocarcinoma diagnosed in March 2016.  PRIOR THERAPY: Status post left video-assisted thoracoscopy, wedge resection left lower lobe, thoracoscopic left lower lobectomy and mediastinal lymph node dissection on 08/14/2014.   CURRENT THERAPY: Ketruda 200 mg IV every 3 weeks, first dose expected on 05/12/2015. Status post 6 cycles.  INTERVAL HISTORY: Sharon [name] 55 y.o. female returns to the clinic today for follow-up visit accompanied by her daughter and mother. The patient is feeling fine today except for fatigue and occasional constipation. She tolerated the last cycle of her treatment with immunotherapy with [place] fairly well. She denied having any significant skin rash or diarrhea. She has no chest pain, shortness breath, cough or hemoptysis. She has no fever or chills. Her TSH was elevated and the patient was started on levothyroxine but unfortunately she does not take her medication as prescribed. She had repeat CT scan of the chest, abdomen and pelvis performed recently and she is here today for evaluation and discussion of her scan results before starting cycle #7 of her treatment with [place].  MEDICAL HISTORY: Past Medical History  Diagnosis Date  . Ischemic cardiomyopathy   . Hyperlipidemia   . Tobacco user   . Coronary artery disease   . Occlusion of right coronary artery ([place])   . Aortic insufficiency     moderate by echo in 2009  . Acute ST segment elevation MI ([place])   . Neoplasm of uncertain behavior of left lower lobe of lung     per CT CHEST/PET 2/4 and 07/18/14 @ [place]  . S/P coronary artery stent placement    2009  . COPD (chronic obstructive pulmonary disease) ([place])   . Emphysema of lung ([place])   . Pneumonia     2015   HX BRONCHITIS    . Shortness of breath dyspnea     WITH EXEERTION   . Arthritis   . Cancer ([place])     LUNGS    ALLERGIES:  is allergic to codeine; lipitor; morphine and related; and oxycodone-acetaminophen.  MEDICATIONS:  Current Outpatient Prescriptions  Medication Sig Dispense Refill  . alendronate (FOSAMAX) 70 MG tablet Take 70 mg by mouth once a week.  0  . ALPRAZolam (XANAX) 0.25 MG tablet Take 0.25 mg by mouth 2 (two) times daily as needed for anxiety.    Marland Kitchen aspirin (ASPIR-LOW) 81 MG EC tablet Take 81 mg by mouth daily. On hold    . buPROPion (WELLBUTRIN SR) 150 MG 12 hr tablet Take 150 mg by mouth 2 (two) times daily.  2  . clopidogrel (PLAVIX) 75 MG tablet Take 1 tablet (75 mg total) by mouth daily. 90 tablet 3  . levothyroxine (SYNTHROID) 50 MCG tablet Take 1 tablet (50 mcg total) by mouth daily before breakfast. 30 tablet 1  . ondansetron (ZOFRAN) 8 MG tablet Take 1 tablet (8 mg total) by mouth every 8 (eight) hours as needed for nausea or vomiting. 20 tablet 0  . rosuvastatin (CRESTOR) 10 MG tablet Take 1 tablet (10 mg total) by mouth daily. 90 tablet 3  . traMADol (ULTRAM) 50 MG tablet Take 1 tablet (50 mg total) by mouth every 6 (six) hours  as needed. 60 tablet 0  . Vitamin D, Ergocalciferol, (DRISDOL) 50000 UNITS CAPS capsule Take 50,000 Units by mouth every 7 (seven) days.     No current facility-administered medications for this visit.    SURGICAL HISTORY:  Past Surgical History  Procedure Laterality Date  . Cholecystectomy    . Tubal ligation    . Ectopic pregnancies requiring laparotomies    . Cardiac catheterization  10/15/2007    EF 30-40%  . Coronary angioplasty with stent placement      LAD  . US echocardiography  01/07/2008    EF 55-60%  . Transthoracic echocardiogram  10/17/2007  . Cesarean section    . Shoulder surgery      LEFT X 2    (REMOVED SOME COLLAR BONE )  . Video assisted thoracoscopy (vats)/wedge resection Left 08/14/2014    Procedure: VIDEO ASSISTED THORACOSCOPY (VATS)/WEDGE RESECTION;  Surgeon: Melrose Nakayama, MD;  Location: [facility];  Service: Thoracic;  Laterality: Left;  . Lobectomy Left 08/14/2014    Procedure: LEFT LOWER LOBECTOMY;  Surgeon: Melrose Nakayama, MD;  Location: [facility];  Service: Thoracic;  Laterality: Left;  . Lead removal Left 08/14/2014    Procedure: CRYO INTERCOSTAL NERVE BLOCK;  Surgeon: Melrose Nakayama, MD;  Location: [facility];  Service: Thoracic;  Laterality: Left;  . Node dissection Left 08/14/2014    Procedure: NODE DISSECTION, LEFT LOWER LOBE LUNG;  Surgeon: Melrose Nakayama, MD;  Location: [facility];  Service: Thoracic;  Laterality: Left;    REVIEW OF SYSTEMS:  Constitutional: positive for fatigue Eyes: negative Ears, nose, mouth, throat, and face: negative Respiratory: negative Cardiovascular: negative Gastrointestinal: positive for constipation Genitourinary:negative Integument/breast: negative Hematologic/lymphatic: negative Musculoskeletal:negative Neurological: negative Behavioral/Psych: negative Endocrine: negative Allergic/Immunologic: negative   PHYSICAL EXAMINATION: General appearance: alert, cooperative and no distress Head: Normocephalic, without obvious abnormality, atraumatic Neck: no adenopathy, no JVD, supple, symmetrical, trachea midline and thyroid not enlarged, symmetric, no tenderness/mass/nodules Lymph nodes: Cervical, supraclavicular, and axillary nodes normal. Resp: clear to auscultation bilaterally Back: symmetric, no curvature. ROM normal. No CVA tenderness. Cardio: regular rate and rhythm, S1, S2 normal, no murmur, click, rub or gallop GI: soft, non-tender; bowel sounds normal; no masses,  no organomegaly Extremities: extremities normal, atraumatic, no cyanosis or edema Neurologic: Alert and oriented X 3, normal strength and tone. Normal  symmetric reflexes. Normal coordination and gait  ECOG PERFORMANCE STATUS: 1 - Symptomatic but completely ambulatory  Blood pressure 141/52, pulse 68, temperature 98.3 F (36.8 C), temperature source Oral, resp. rate 18, height '5\' 4"'$  (1.626 m), weight 145 lb 11.2 oz (66.089 kg), SpO2 99 %.  LABORATORY DATA: Lab Results  Component Value Date   WBC 6.5 09/15/2015   HGB 15.1 09/15/2015   HCT 44.6 09/15/2015   MCV 95.3 09/15/2015   PLT 180 09/15/2015      Chemistry      Component Value Date/Time   NA 142 09/15/2015 1020   NA 138 08/16/2014 0520   K 4.4 09/15/2015 1020   K 3.9 08/16/2014 0520   CL 109 08/16/2014 0520   CO2 25 09/15/2015 1020   CO2 25 08/16/2014 0520   BUN 7.4 09/15/2015 1020   BUN 8 08/16/2014 0520   CREATININE 0.9 09/15/2015 1020   CREATININE 0.59 08/16/2014 0520      Component Value Date/Time   CALCIUM 9.8 09/15/2015 1020   CALCIUM 8.1* 08/16/2014 0520   ALKPHOS 88 09/15/2015 1020   ALKPHOS 53 08/16/2014 0520   AST 40* 09/15/2015 1020  AST 27 08/16/2014 0520   ALT 22 09/15/2015 1020   ALT 20 08/16/2014 0520   BILITOT 0.34 09/15/2015 1020   BILITOT 0.7 08/16/2014 0520       RADIOGRAPHIC STUDIES: Ct Chest W Contrast  09/14/2015  CLINICAL DATA:  Left-sided lung cancer diagnosed 2/16 with partial left lung resection. Immunotherapy in progress. Restaging. EXAM: CT CHEST, ABDOMEN, AND PELVIS WITH CONTRAST TECHNIQUE: Multidetector CT imaging of the chest, abdomen and pelvis was performed following the standard protocol during bolus administration of intravenous contrast. CONTRAST:  158m ISOVUE-300 IOPAMIDOL (ISOVUE-300) INJECTION 61% COMPARISON:  07/14/2015 FINDINGS: CT CHEST FINDINGS Mediastinum/Nodes: No supraclavicular adenopathy. Aortic and branch vessel atherosclerosis. Upper normal ascending aortic size, 3.9 cm. This is similar. Normal heart size, without pericardial effusion. Suspect a left ventricular apical infarct, with hypoattenuation on image  36/series 2. Multivessel coronary artery atherosclerosis. Right paratracheal node measures 12 mm on image 13/series 2 versus 14 mm on the prior exam (when remeasured). A prevascular node measures 11 mm on image 21/series 2 versus similar. No hilar adenopathy. Lungs/Pleura: No pleural fluid. Moderate centrilobular emphysema. Mild paraseptal emphysema. 3 mm right upper lobe pulmonary nodule is unchanged, including image 35/series 6. Surgical sutures in the left upper lobe. Scarring in the left lower lobe. Musculoskeletal: No acute osseous abnormality. Vague sclerosis involving the anterior aspect of T11 vertebral body is similar and favored to be degenerative. CT ABDOMEN PELVIS FINDINGS Hepatobiliary: Normal liver. Cholecystectomy, without biliary ductal dilatation. Pancreas: Normal, without mass or ductal dilatation. Spleen: Normal in size, without focal abnormality. Adrenals/Urinary Tract: Normal adrenal glands. Normal kidneys, without hydronephrosis. Normal urinary bladder. Stomach/Bowel: Normal stomach, without wall thickening. Colonic stool burden suggests constipation. Normal terminal ileum. Normal small bowel. Vascular/Lymphatic: Aortic and branch vessel atherosclerosis. No abdominopelvic adenopathy. Reproductive: Normal uterus and adnexa. Other: No significant free fluid. No evidence of omental or peritoneal disease. Musculoskeletal: Degenerative disc disease at the lumbosacral junction. IMPRESSION: CT CHEST IMPRESSION 1. Minimal decrease in size of thoracic adenopathy. 2. Similar right upper lobe pulmonary nodule. No new or enlarging nodules. 3. Age advanced coronary artery atherosclerosis. Recommend assessment of coronary risk factors and consideration of medical therapy. 4. Borderline dilatation of the ascending aorta, similar. CT ABDOMEN AND PELVIS IMPRESSION 1. No acute process or evidence of metastatic disease in the abdomen or pelvis. 2.  Possible constipation. Electronically Signed   By: KAbigail Miyamoto M.D.   On: 09/14/2015 09:18   Ct Abdomen Pelvis W Contrast  09/14/2015  CLINICAL DATA:  Left-sided lung cancer diagnosed 2/16 with partial left lung resection. Immunotherapy in progress. Restaging. EXAM: CT CHEST, ABDOMEN, AND PELVIS WITH CONTRAST TECHNIQUE: Multidetector CT imaging of the chest, abdomen and pelvis was performed following the standard protocol during bolus administration of intravenous contrast. CONTRAST:  1020mISOVUE-300 IOPAMIDOL (ISOVUE-300) INJECTION 61% COMPARISON:  07/14/2015 FINDINGS: CT CHEST FINDINGS Mediastinum/Nodes: No supraclavicular adenopathy. Aortic and branch vessel atherosclerosis. Upper normal ascending aortic size, 3.9 cm. This is similar. Normal heart size, without pericardial effusion. Suspect a left ventricular apical infarct, with hypoattenuation on image 36/series 2. Multivessel coronary artery atherosclerosis. Right paratracheal node measures 12 mm on image 13/series 2 versus 14 mm on the prior exam (when remeasured). A prevascular node measures 11 mm on image 21/series 2 versus similar. No hilar adenopathy. Lungs/Pleura: No pleural fluid. Moderate centrilobular emphysema. Mild paraseptal emphysema. 3 mm right upper lobe pulmonary nodule is unchanged, including image 35/series 6. Surgical sutures in the left upper lobe. Scarring in the left lower lobe. Musculoskeletal: No  acute osseous abnormality. Vague sclerosis involving the anterior aspect of T11 vertebral body is similar and favored to be degenerative. CT ABDOMEN PELVIS FINDINGS Hepatobiliary: Normal liver. Cholecystectomy, without biliary ductal dilatation. Pancreas: Normal, without mass or ductal dilatation. Spleen: Normal in size, without focal abnormality. Adrenals/Urinary Tract: Normal adrenal glands. Normal kidneys, without hydronephrosis. Normal urinary bladder. Stomach/Bowel: Normal stomach, without wall thickening. Colonic stool burden suggests constipation. Normal terminal ileum. Normal small bowel.  Vascular/Lymphatic: Aortic and branch vessel atherosclerosis. No abdominopelvic adenopathy. Reproductive: Normal uterus and adnexa. Other: No significant free fluid. No evidence of omental or peritoneal disease. Musculoskeletal: Degenerative disc disease at the lumbosacral junction. IMPRESSION: CT CHEST IMPRESSION 1. Minimal decrease in size of thoracic adenopathy. 2. Similar right upper lobe pulmonary nodule. No new or enlarging nodules. 3. Age advanced coronary artery atherosclerosis. Recommend assessment of coronary risk factors and consideration of medical therapy. 4. Borderline dilatation of the ascending aorta, similar. CT ABDOMEN AND PELVIS IMPRESSION 1. No acute process or evidence of metastatic disease in the abdomen or pelvis. 2.  Possible constipation. Electronically Signed   By: Abigail Miyamoto M.D.   On: 09/14/2015 09:18    ASSESSMENT AND PLAN: This is a very pleasant 55 years old white female with history of recurrent/metastatic non-small cell lung cancer , adenocarcinoma in November 2016 initially diagnosed as stage IB non-small cell lung cancer status post left lower lobectomy with mediastinal lymph node dissection with evidence of pleural invasion and has been on observation. Unfortunately the patient has evidence for disease recurrence and the recent CT scan of the chest showed new small left pleural effusion as well as new pleural based nodules worrisome for transpleural spread of tumor concerning for disease recurrence. The PET scan performed recently shows also numerous new small hypermetabolic soft tissue nodules throughout the left pleural space and small tiny left pleural effusion as well as hypermetabolic mediastinal lymphadenopathy. The patient is currently on treatment with immunotherapy with Ketruda (pembrolizumab) status post 6 cycles. She is tolerating her treatment fairly well except for fatigue and occasional constipation. The recent CT scan of the chest, abdomen and pelvis  showed no evidence for disease progression. I discussed the scan results with the patient and her family. I recommended for her to continue on treatment with immunotherapy as scheduled. I recommended for the patient to proceed with cycle #6 today. 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 I strongly recommended for her to take her dose of levothyroxine as a scheduled on daily basis. I will adjust her dose as needed based on the TSH. For smoke cessation, I strongly encouraged the patient to quit smoking and offered her to smoke cessation program. I will see the patient back for follow-up visit in 3 weeks for reevaluation before starting cycle #8. The patient was advised to call immediately if she has any concerning symptoms in the interval. The patient voices understanding of current disease status and treatment options and is in agreement with the current care plan.  All questions were answered. The patient knows to call the clinic with any problems, questions or concerns. We can certainly see the patient much sooner if necessary.  Disclaimer: This note was dictated with voice recognition software. Similar sounding words can inadvertently be transcribed and may not be corrected upon review.

## 2015-09-15 NOTE — Patient Instructions (Signed)
Pembrolizumab injection What is this medicine? PEMBROLIZUMAB (pem broe liz ue mab) is a monoclonal antibody. It is used to treat melanoma and non-small cell lung cancer. This medicine may be used for other purposes; ask your health care provider or pharmacist if you have questions. What should I tell my health care provider before I take this medicine? They need to know if you have any of these conditions: -diabetes -immune system problems -inflammatory bowel disease -liver disease -lung or breathing disease -lupus -an unusual or allergic reaction to pembrolizumab, other medicines, foods, dyes, or preservatives -pregnant or trying to get pregnant -breast-feeding How should I use this medicine? This medicine is for infusion into a vein. It is given by a health care professional in a hospital or clinic setting. A special MedGuide will be given to you before each treatment. Be sure to read this information carefully each time. Talk to your pediatrician regarding the use of this medicine in children. Special care may be needed. Overdosage: If you think you have taken too much of this medicine contact a poison control center or emergency room at once. NOTE: This medicine is only for you. Do not share this medicine with others. What if I miss a dose? It is important not to miss your dose. Call your doctor or health care professional if you are unable to keep an appointment. What may interact with this medicine? Interactions have not been studied. Give your health care provider a list of all the medicines, herbs, non-prescription drugs, or dietary supplements you use. Also tell them if you smoke, drink alcohol, or use illegal drugs. Some items may interact with your medicine. This list may not describe all possible interactions. Give your health care provider a list of all the medicines, herbs, non-prescription drugs, or dietary supplements you use. Also tell them if you smoke, drink alcohol, or  use illegal drugs. Some items may interact with your medicine. What should I watch for while using this medicine? Your condition will be monitored carefully while you are receiving this medicine. You may need blood work done while you are taking this medicine. Do not become pregnant while taking this medicine or for 4 months after stopping it. Women should inform their doctor if they wish to become pregnant or think they might be pregnant. There is a potential for serious side effects to an unborn child. Talk to your health care professional or pharmacist for more information. Do not breast-feed an infant while taking this medicine or for 4 months after the last dose. What side effects may I notice from receiving this medicine? Side effects that you should report to your doctor or health care professional as soon as possible: -allergic reactions like skin rash, itching or hives, swelling of the face, lips, or tongue -bloody or black, tarry stools -breathing problems -change in the amount of urine -changes in vision -chest pain -chills -dark urine -dizziness or feeling faint or lightheaded -fast or irregular heartbeat -fever -flushing -hair loss -muscle pain -muscle weakness -persistent headache -signs and symptoms of high blood sugar such as dizziness; dry mouth; dry skin; fruity breath; nausea; stomach pain; increased hunger or thirst; increased urination -signs and symptoms of liver injury like dark urine, light-colored stools, loss of appetite, nausea, right upper belly pain, yellowing of the eyes or skin -stomach pain -weight loss Side effects that usually do not require medical attention (Report these to your doctor or health care professional if they continue or are bothersome.):constipation -cough -diarrhea -joint pain -  tiredness This list may not describe all possible side effects. Call your doctor for medical advice about side effects. You may report side effects to FDA at  1-800-FDA-1088. Where should I keep my medicine? This drug is given in a hospital or clinic and will not be stored at home. NOTE: This sheet is a summary. It may not cover all possible information. If you have questions about this medicine, talk to your doctor, pharmacist, or health care provider.    2016, Elsevier/Gold Standard. (2014-07-15 17:24:19)

## 2015-09-15 NOTE — Progress Notes (Signed)
Oncology Nurse Navigator Documentation  Oncology Nurse Navigator Flowsheets 09/15/2015  Navigator Location [facility]  Navigator Encounter Type Clinic/MDC  Patient Visit Type MedOnc  Treatment Phase Treatment  Barriers/Navigation Needs Education  Education Pain/ Symptom Management  Interventions Education Method  Education Method Verbal;Written  Acuity Level 1  Time Spent with Patient 30   Spoke to patient and family today at [facility].  She is doing well without complaints.  I gave and explained information on her treatment regimen.

## 2015-10-07 ENCOUNTER — Ambulatory Visit (HOSPITAL_BASED_OUTPATIENT_CLINIC_OR_DEPARTMENT_OTHER): Payer: BLUE CROSS/BLUE SHIELD | Admitting: Internal Medicine

## 2015-10-07 ENCOUNTER — Ambulatory Visit (HOSPITAL_BASED_OUTPATIENT_CLINIC_OR_DEPARTMENT_OTHER): Payer: BLUE CROSS/BLUE SHIELD

## 2015-10-07 ENCOUNTER — Other Ambulatory Visit (HOSPITAL_BASED_OUTPATIENT_CLINIC_OR_DEPARTMENT_OTHER): Payer: BLUE CROSS/BLUE SHIELD

## 2015-10-07 ENCOUNTER — Encounter: Payer: Self-pay | Admitting: Internal Medicine

## 2015-10-07 ENCOUNTER — Telehealth: Payer: Self-pay | Admitting: Internal Medicine

## 2015-10-07 VITALS — BP 132/63 | HR 83 | Temp 98.1°F | Resp 18 | Ht 64.0 in | Wt 143.6 lb

## 2015-10-07 DIAGNOSIS — Z5112 Encounter for antineoplastic immunotherapy: Secondary | ICD-10-CM | POA: Diagnosis not present

## 2015-10-07 DIAGNOSIS — C3412 Malignant neoplasm of upper lobe, left bronchus or lung: Secondary | ICD-10-CM

## 2015-10-07 DIAGNOSIS — C3432 Malignant neoplasm of lower lobe, left bronchus or lung: Secondary | ICD-10-CM

## 2015-10-07 DIAGNOSIS — J90 Pleural effusion, not elsewhere classified: Secondary | ICD-10-CM | POA: Diagnosis not present

## 2015-10-07 DIAGNOSIS — R599 Enlarged lymph nodes, unspecified: Secondary | ICD-10-CM | POA: Diagnosis not present

## 2015-10-07 DIAGNOSIS — R911 Solitary pulmonary nodule: Secondary | ICD-10-CM | POA: Diagnosis not present

## 2015-10-07 DIAGNOSIS — E039 Hypothyroidism, unspecified: Secondary | ICD-10-CM

## 2015-10-07 LAB — COMPREHENSIVE METABOLIC PANEL
ALT: 23 U/L (ref 0–55)
ANION GAP: 8 meq/L (ref 3–11)
AST: 28 U/L (ref 5–34)
Albumin: 3.6 g/dL (ref 3.5–5.0)
Alkaline Phosphatase: 102 U/L (ref 40–150)
BILIRUBIN TOTAL: 0.37 mg/dL (ref 0.20–1.20)
BUN: 11.9 mg/dL (ref 7.0–26.0)
CALCIUM: 9.7 mg/dL (ref 8.4–10.4)
CHLORIDE: 108 meq/L (ref 98–109)
CO2: 24 mEq/L (ref 22–29)
CREATININE: 0.9 mg/dL (ref 0.6–1.1)
EGFR: 74 mL/min/{1.73_m2} — AB (ref >90)
Glucose: 125 mg/dl (ref 70–140)
Potassium: 3.8 mEq/L (ref 3.5–5.1)
Sodium: 141 mEq/L (ref 136–145)
Total Protein: 7.7 g/dL (ref 6.4–8.3)

## 2015-10-07 LAB — CBC WITH DIFFERENTIAL/PLATELET
BASO%: 0.5 % (ref 0.0–2.0)
BASOS ABS: 0 10*3/uL (ref 0.0–0.1)
EOS ABS: 0.1 10*3/uL (ref 0.0–0.5)
EOS%: 1.4 % (ref 0.0–7.0)
HCT: 43.6 % (ref 34.8–46.6)
HEMOGLOBIN: 15.1 g/dL (ref 11.6–15.9)
LYMPH%: 27.8 % (ref 14.0–49.7)
MCH: 33 pg (ref 25.1–34.0)
MCHC: 34.6 g/dL (ref 31.5–36.0)
MCV: 95.4 fL (ref 79.5–101.0)
MONO#: 0.6 10*3/uL (ref 0.1–0.9)
MONO%: 7.2 % (ref 0.0–14.0)
NEUT#: 5 10*3/uL (ref 1.5–6.5)
NEUT%: 63.1 % (ref 38.4–76.8)
PLATELETS: 252 10*3/uL (ref 145–400)
RBC: 4.57 10*6/uL (ref 3.70–5.45)
RDW: 16.9 % — ABNORMAL HIGH (ref 11.2–14.5)
WBC: 8 10*3/uL (ref 3.9–10.3)
lymph#: 2.2 10*3/uL (ref 0.9–3.3)
nRBC: 0 % (ref 0–0)

## 2015-10-07 MED ORDER — SODIUM CHLORIDE 0.9 % IV SOLN
Freq: Once | INTRAVENOUS | Status: AC
  Administered 2015-10-07: 12:00:00 via INTRAVENOUS

## 2015-10-07 MED ORDER — SODIUM CHLORIDE 0.9 % IV SOLN
200.0000 mg | Freq: Once | INTRAVENOUS | Status: AC
  Administered 2015-10-07: 200 mg via INTRAVENOUS
  Filled 2015-10-07: qty 8

## 2015-10-07 NOTE — Telephone Encounter (Signed)
Pt will p/u  sched in tx room

## 2015-10-07 NOTE — Patient Instructions (Signed)
[facility] Discharge Instructions for Patients Receiving Chemotherapy  Today you received the following chemotherapy agents:  Keytruda.  To help prevent nausea and vomiting after your treatment, we encourage you to take your nausea medication as prescribed.   If you develop nausea and vomiting that is not controlled by your nausea medication, call the clinic.   BELOW ARE SYMPTOMS THAT SHOULD BE REPORTED IMMEDIATELY:  *FEVER GREATER THAN 100.5 F  *CHILLS WITH OR WITHOUT FEVER  NAUSEA AND VOMITING THAT IS NOT CONTROLLED WITH YOUR NAUSEA MEDICATION  *UNUSUAL SHORTNESS OF BREATH  *UNUSUAL BRUISING OR BLEEDING  TENDERNESS IN MOUTH AND THROAT WITH OR WITHOUT PRESENCE OF ULCERS  *URINARY PROBLEMS  *BOWEL PROBLEMS  UNUSUAL RASH Items with * indicate a potential emergency and should be followed up as soon as possible.  Feel free to call the clinic you have any questions or concerns. The clinic phone number is [phone].  Please show the CHEMO ALERT CARD at check-in to the Emergency Department and triage nurse.   

## 2015-10-07 NOTE — Progress Notes (Signed)
[facility] [phone]   Fax:(336) [phone]  OFFICE PROGRESS NOTE  [name], NP [address] [place] [address]  DIAGNOSIS: Recurrent non-small cell lung cancer, adenocarcinoma with PDL 1 expression 60% diagnosed in November 2016. This was initially diagnosed as Stage IB (T2a, N0, M0) non-small cell lung cancer, adenocarcinoma diagnosed in March 2016.  PRIOR THERAPY: Status post left video-assisted thoracoscopy, wedge resection left lower lobe, thoracoscopic left lower lobectomy and mediastinal lymph node dissection on 08/14/2014.   CURRENT THERAPY: Ketruda 200 mg IV every 3 weeks, first dose expected on 05/12/2015. Status post 7 cycles.  INTERVAL HISTORY: Sharon [name] 55 y.o. female returns to the clinic today for follow-up visit accompanied by her daughter. The patient is feeling fine today except for occasional dizzy spells. She tolerated the last cycle of her treatment with immunotherapy with Sharon [name] fairly well. She denied having any significant skin rash or diarrhea. She has no chest pain, shortness breath, cough or hemoptysis. She has no fever or chills. She is here to start cycle #8 of her treatment today.  MEDICAL HISTORY: Past Medical History  Diagnosis Date  . Ischemic cardiomyopathy   . Hyperlipidemia   . Tobacco user   . Coronary artery disease   . Occlusion of right coronary artery ([place])   . Aortic insufficiency     moderate by echo in 2009  . Acute ST segment elevation MI ([place])   . Neoplasm of uncertain behavior of left lower lobe of lung     per CT CHEST/PET 2/4 and 07/18/14 @ [place]  . S/P coronary artery stent placement     2009  . COPD (chronic obstructive pulmonary disease) ([place])   . Emphysema of lung ([place])   . Pneumonia     2015   HX BRONCHITIS    . Shortness of breath dyspnea     WITH EXEERTION   . Arthritis   . Cancer ([place])     LUNGS    ALLERGIES:  is allergic to codeine; lipitor; morphine and related; and  oxycodone-acetaminophen.  MEDICATIONS:  Current Outpatient Prescriptions  Medication Sig Dispense Refill  . alendronate (FOSAMAX) 70 MG tablet Take 70 mg by mouth once a week.  0  . ALPRAZolam (XANAX) 0.25 MG tablet Take 0.25 mg by mouth 2 (two) times daily as needed for anxiety.    Marland Kitchen aspirin (ASPIR-LOW) 81 MG EC tablet Take 81 mg by mouth daily. On hold    . buPROPion (WELLBUTRIN SR) 150 MG 12 hr tablet Take 150 mg by mouth 2 (two) times daily.  2  . clopidogrel (PLAVIX) 75 MG tablet Take 1 tablet (75 mg total) by mouth daily. 90 tablet 3  . levothyroxine (SYNTHROID) 50 MCG tablet Take 1 tablet (50 mcg total) by mouth daily before breakfast. 30 tablet 1  . ondansetron (ZOFRAN) 8 MG tablet Take 1 tablet (8 mg total) by mouth every 8 (eight) hours as needed for nausea or vomiting. 20 tablet 0  . rosuvastatin (CRESTOR) 10 MG tablet Take 1 tablet (10 mg total) by mouth daily. 90 tablet 3  . traMADol (ULTRAM) 50 MG tablet Take 1 tablet (50 mg total) by mouth every 6 (six) hours as needed. 60 tablet 0  . Vitamin D, Ergocalciferol, (DRISDOL) 50000 UNITS CAPS capsule Take 50,000 Units by mouth every 7 (seven) days.     No current facility-administered medications for this visit.    SURGICAL HISTORY:  Past Surgical History  Procedure  Laterality Date  . Cholecystectomy    . Tubal ligation    . Ectopic pregnancies requiring laparotomies    . Cardiac catheterization  10/15/2007    EF 30-40%  . Coronary angioplasty with stent placement      LAD  . US echocardiography  01/07/2008    EF 55-60%  . Transthoracic echocardiogram  10/17/2007  . Cesarean section    . Shoulder surgery      LEFT X 2   (REMOVED SOME COLLAR BONE )  . Video assisted thoracoscopy (vats)/wedge resection Left 08/14/2014    Procedure: VIDEO ASSISTED THORACOSCOPY (VATS)/WEDGE RESECTION;  Surgeon: Melrose Nakayama, MD;  Location: [facility];  Service: Thoracic;  Laterality: Left;  . Lobectomy Left 08/14/2014    Procedure: LEFT  LOWER LOBECTOMY;  Surgeon: Melrose Nakayama, MD;  Location: [facility];  Service: Thoracic;  Laterality: Left;  . Lead removal Left 08/14/2014    Procedure: CRYO INTERCOSTAL NERVE BLOCK;  Surgeon: Melrose Nakayama, MD;  Location: [facility];  Service: Thoracic;  Laterality: Left;  . Node dissection Left 08/14/2014    Procedure: NODE DISSECTION, LEFT LOWER LOBE LUNG;  Surgeon: Melrose Nakayama, MD;  Location: [facility];  Service: Thoracic;  Laterality: Left;    REVIEW OF SYSTEMS:  A comprehensive review of systems was negative except for: Neurological: positive for dizziness   PHYSICAL EXAMINATION: General appearance: alert, cooperative and no distress Head: Normocephalic, without obvious abnormality, atraumatic Neck: no adenopathy, no JVD, supple, symmetrical, trachea midline and thyroid not enlarged, symmetric, no tenderness/mass/nodules Lymph nodes: Cervical, supraclavicular, and axillary nodes normal. Resp: clear to auscultation bilaterally Back: symmetric, no curvature. ROM normal. No CVA tenderness. Cardio: regular rate and rhythm, S1, S2 normal, no murmur, click, rub or gallop GI: soft, non-tender; bowel sounds normal; no masses,  no organomegaly Extremities: extremities normal, atraumatic, no cyanosis or edema Neurologic: Alert and oriented X 3, normal strength and tone. Normal symmetric reflexes. Normal coordination and gait  ECOG PERFORMANCE STATUS: 1 - Symptomatic but completely ambulatory  There were no vitals taken for this visit.  LABORATORY DATA: Lab Results  Component Value Date   WBC 8.0 10/07/2015   HGB 15.1 10/07/2015   HCT 43.6 10/07/2015   MCV 95.4 10/07/2015   PLT 252 10/07/2015      Chemistry      Component Value Date/Time   NA 142 09/15/2015 1020   NA 138 08/16/2014 0520   K 4.4 09/15/2015 1020   K 3.9 08/16/2014 0520   CL 109 08/16/2014 0520   CO2 25 09/15/2015 1020   CO2 25 08/16/2014 0520   BUN 7.4 09/15/2015 1020   BUN 8 08/16/2014 0520    CREATININE 0.9 09/15/2015 1020   CREATININE 0.59 08/16/2014 0520      Component Value Date/Time   CALCIUM 9.8 09/15/2015 1020   CALCIUM 8.1* 08/16/2014 0520   ALKPHOS 88 09/15/2015 1020   ALKPHOS 53 08/16/2014 0520   AST 40* 09/15/2015 1020   AST 27 08/16/2014 0520   ALT 22 09/15/2015 1020   ALT 20 08/16/2014 0520   BILITOT 0.34 09/15/2015 1020   BILITOT 0.7 08/16/2014 0520       RADIOGRAPHIC STUDIES: Ct Chest W Contrast  09/14/2015  CLINICAL DATA:  Left-sided lung cancer diagnosed 2/16 with partial left lung resection. Immunotherapy in progress. Restaging. EXAM: CT CHEST, ABDOMEN, AND PELVIS WITH CONTRAST TECHNIQUE: Multidetector CT imaging of the chest, abdomen and pelvis was performed following the standard protocol during bolus administration of intravenous  contrast. CONTRAST:  155m ISOVUE-300 IOPAMIDOL (ISOVUE-300) INJECTION 61% COMPARISON:  07/14/2015 FINDINGS: CT CHEST FINDINGS Mediastinum/Nodes: No supraclavicular adenopathy. Aortic and branch vessel atherosclerosis. Upper normal ascending aortic size, 3.9 cm. This is similar. Normal heart size, without pericardial effusion. Suspect a left ventricular apical infarct, with hypoattenuation on image 36/series 2. Multivessel coronary artery atherosclerosis. Right paratracheal node measures 12 mm on image 13/series 2 versus 14 mm on the prior exam (when remeasured). A prevascular node measures 11 mm on image 21/series 2 versus similar. No hilar adenopathy. Lungs/Pleura: No pleural fluid. Moderate centrilobular emphysema. Mild paraseptal emphysema. 3 mm right upper lobe pulmonary nodule is unchanged, including image 35/series 6. Surgical sutures in the left upper lobe. Scarring in the left lower lobe. Musculoskeletal: No acute osseous abnormality. Vague sclerosis involving the anterior aspect of T11 vertebral body is similar and favored to be degenerative. CT ABDOMEN PELVIS FINDINGS Hepatobiliary: Normal liver. Cholecystectomy, without biliary  ductal dilatation. Pancreas: Normal, without mass or ductal dilatation. Spleen: Normal in size, without focal abnormality. Adrenals/Urinary Tract: Normal adrenal glands. Normal kidneys, without hydronephrosis. Normal urinary bladder. Stomach/Bowel: Normal stomach, without wall thickening. Colonic stool burden suggests constipation. Normal terminal ileum. Normal small bowel. Vascular/Lymphatic: Aortic and branch vessel atherosclerosis. No abdominopelvic adenopathy. Reproductive: Normal uterus and adnexa. Other: No significant free fluid. No evidence of omental or peritoneal disease. Musculoskeletal: Degenerative disc disease at the lumbosacral junction. IMPRESSION: CT CHEST IMPRESSION 1. Minimal decrease in size of thoracic adenopathy. 2. Similar right upper lobe pulmonary nodule. No new or enlarging nodules. 3. Age advanced coronary artery atherosclerosis. Recommend assessment of coronary risk factors and consideration of medical therapy. 4. Borderline dilatation of the ascending aorta, similar. CT ABDOMEN AND PELVIS IMPRESSION 1. No acute process or evidence of metastatic disease in the abdomen or pelvis. 2.  Possible constipation. Electronically Signed   By: KAbigail MiyamotoM.D.   On: 09/14/2015 09:18   Ct Abdomen Pelvis W Contrast  09/14/2015  CLINICAL DATA:  Left-sided lung cancer diagnosed 2/16 with partial left lung resection. Immunotherapy in progress. Restaging. EXAM: CT CHEST, ABDOMEN, AND PELVIS WITH CONTRAST TECHNIQUE: Multidetector CT imaging of the chest, abdomen and pelvis was performed following the standard protocol during bolus administration of intravenous contrast. CONTRAST:  1042mISOVUE-300 IOPAMIDOL (ISOVUE-300) INJECTION 61% COMPARISON:  07/14/2015 FINDINGS: CT CHEST FINDINGS Mediastinum/Nodes: No supraclavicular adenopathy. Aortic and branch vessel atherosclerosis. Upper normal ascending aortic size, 3.9 cm. This is similar. Normal heart size, without pericardial effusion. Suspect a left  ventricular apical infarct, with hypoattenuation on image 36/series 2. Multivessel coronary artery atherosclerosis. Right paratracheal node measures 12 mm on image 13/series 2 versus 14 mm on the prior exam (when remeasured). A prevascular node measures 11 mm on image 21/series 2 versus similar. No hilar adenopathy. Lungs/Pleura: No pleural fluid. Moderate centrilobular emphysema. Mild paraseptal emphysema. 3 mm right upper lobe pulmonary nodule is unchanged, including image 35/series 6. Surgical sutures in the left upper lobe. Scarring in the left lower lobe. Musculoskeletal: No acute osseous abnormality. Vague sclerosis involving the anterior aspect of T11 vertebral body is similar and favored to be degenerative. CT ABDOMEN PELVIS FINDINGS Hepatobiliary: Normal liver. Cholecystectomy, without biliary ductal dilatation. Pancreas: Normal, without mass or ductal dilatation. Spleen: Normal in size, without focal abnormality. Adrenals/Urinary Tract: Normal adrenal glands. Normal kidneys, without hydronephrosis. Normal urinary bladder. Stomach/Bowel: Normal stomach, without wall thickening. Colonic stool burden suggests constipation. Normal terminal ileum. Normal small bowel. Vascular/Lymphatic: Aortic and branch vessel atherosclerosis. No abdominopelvic adenopathy. Reproductive: Normal uterus and adnexa.  Other: No significant free fluid. No evidence of omental or peritoneal disease. Musculoskeletal: Degenerative disc disease at the lumbosacral junction. IMPRESSION: CT CHEST IMPRESSION 1. Minimal decrease in size of thoracic adenopathy. 2. Similar right upper lobe pulmonary nodule. No new or enlarging nodules. 3. Age advanced coronary artery atherosclerosis. Recommend assessment of coronary risk factors and consideration of medical therapy. 4. Borderline dilatation of the ascending aorta, similar. CT ABDOMEN AND PELVIS IMPRESSION 1. No acute process or evidence of metastatic disease in the abdomen or pelvis. 2.   Possible constipation. Electronically Signed   By: Abigail Miyamoto M.D.   On: 09/14/2015 09:18    ASSESSMENT AND PLAN: This is a very pleasant 55 years old white female with history of recurrent/metastatic non-small cell lung cancer , adenocarcinoma in November 2016 initially diagnosed as stage IB non-small cell lung cancer status post left lower lobectomy with mediastinal lymph node dissection with evidence of pleural invasion and has been on observation. Unfortunately the patient has evidence for disease recurrence and the recent CT scan of the chest showed new small left pleural effusion as well as new pleural based nodules worrisome for transpleural spread of tumor concerning for disease recurrence. The PET scan performed recently shows also numerous new small hypermetabolic soft tissue nodules throughout the left pleural space and small tiny left pleural effusion as well as hypermetabolic mediastinal lymphadenopathy. The patient is currently on treatment with immunotherapy with Ketruda (pembrolizumab) status post 7 cycles. She is tolerating her treatment fairly well except for occasional dizzy spells. I recommended for the patient to proceed with cycle #8 today. 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 For smoke cessation, I strongly encouraged the patient to quit smoking and offered her to smoke cessation program. I will see the patient back for follow-up visit in 3 weeks for reevaluation before starting cycle #9. The patient was advised to call immediately if she has any concerning symptoms in the interval. The patient voices understanding of current disease status and treatment options and is in agreement with the current care plan.  All questions were answered. The patient knows to call the clinic with any problems, questions or concerns. We can certainly see the patient much sooner if necessary.  Disclaimer: This note was dictated with voice  recognition software. Similar sounding words can inadvertently be transcribed and may not be corrected upon review.

## 2015-10-08 ENCOUNTER — Encounter: Payer: Self-pay | Admitting: Internal Medicine

## 2015-10-08 NOTE — Progress Notes (Signed)
fmla form sent 04/27/15 I sent to medical records

## 2015-10-28 ENCOUNTER — Ambulatory Visit (HOSPITAL_BASED_OUTPATIENT_CLINIC_OR_DEPARTMENT_OTHER): Payer: BLUE CROSS/BLUE SHIELD | Admitting: Internal Medicine

## 2015-10-28 ENCOUNTER — Other Ambulatory Visit (HOSPITAL_BASED_OUTPATIENT_CLINIC_OR_DEPARTMENT_OTHER): Payer: BLUE CROSS/BLUE SHIELD

## 2015-10-28 ENCOUNTER — Telehealth: Payer: Self-pay | Admitting: *Deleted

## 2015-10-28 ENCOUNTER — Other Ambulatory Visit: Payer: Self-pay | Admitting: Internal Medicine

## 2015-10-28 ENCOUNTER — Encounter: Payer: Self-pay | Admitting: Internal Medicine

## 2015-10-28 ENCOUNTER — Telehealth: Payer: Self-pay | Admitting: Internal Medicine

## 2015-10-28 ENCOUNTER — Ambulatory Visit (HOSPITAL_BASED_OUTPATIENT_CLINIC_OR_DEPARTMENT_OTHER): Payer: BLUE CROSS/BLUE SHIELD

## 2015-10-28 VITALS — BP 123/59 | HR 72 | Temp 98.6°F | Resp 18 | Ht 64.0 in | Wt 145.1 lb

## 2015-10-28 DIAGNOSIS — C3432 Malignant neoplasm of lower lobe, left bronchus or lung: Secondary | ICD-10-CM | POA: Diagnosis not present

## 2015-10-28 DIAGNOSIS — Z720 Tobacco use: Secondary | ICD-10-CM | POA: Diagnosis not present

## 2015-10-28 DIAGNOSIS — C3412 Malignant neoplasm of upper lobe, left bronchus or lung: Secondary | ICD-10-CM

## 2015-10-28 DIAGNOSIS — Z5112 Encounter for antineoplastic immunotherapy: Secondary | ICD-10-CM | POA: Diagnosis not present

## 2015-10-28 LAB — COMPREHENSIVE METABOLIC PANEL
ALT: 15 U/L (ref 0–55)
ANION GAP: 9 meq/L (ref 3–11)
AST: 23 U/L (ref 5–34)
Albumin: 3.7 g/dL (ref 3.5–5.0)
Alkaline Phosphatase: 81 U/L (ref 40–150)
BILIRUBIN TOTAL: 0.57 mg/dL (ref 0.20–1.20)
BUN: 10.5 mg/dL (ref 7.0–26.0)
CHLORIDE: 108 meq/L (ref 98–109)
CO2: 23 meq/L (ref 22–29)
CREATININE: 0.9 mg/dL (ref 0.6–1.1)
Calcium: 9.5 mg/dL (ref 8.4–10.4)
EGFR: 77 mL/min/{1.73_m2} — ABNORMAL LOW (ref >90)
Glucose: 87 mg/dl (ref 70–140)
Potassium: 4 mEq/L (ref 3.5–5.1)
Sodium: 139 mEq/L (ref 136–145)
TOTAL PROTEIN: 7.7 g/dL (ref 6.4–8.3)

## 2015-10-28 LAB — CBC WITH DIFFERENTIAL/PLATELET
BASO%: 0.7 % (ref 0.0–2.0)
Basophils Absolute: 0 10*3/uL (ref 0.0–0.1)
EOS%: 1.8 % (ref 0.0–7.0)
Eosinophils Absolute: 0.1 10*3/uL (ref 0.0–0.5)
HCT: 44.6 % (ref 34.8–46.6)
HGB: 15 g/dL (ref 11.6–15.9)
LYMPH%: 30.8 % (ref 14.0–49.7)
MCH: 33.1 pg (ref 25.1–34.0)
MCHC: 33.7 g/dL (ref 31.5–36.0)
MCV: 98.3 fL (ref 79.5–101.0)
MONO#: 0.5 10*3/uL (ref 0.1–0.9)
MONO%: 7.2 % (ref 0.0–14.0)
NEUT%: 59.5 % (ref 38.4–76.8)
NEUTROS ABS: 3.8 10*3/uL (ref 1.5–6.5)
PLATELETS: 201 10*3/uL (ref 145–400)
RBC: 4.54 10*6/uL (ref 3.70–5.45)
RDW: 17 % — ABNORMAL HIGH (ref 11.2–14.5)
WBC: 6.4 10*3/uL (ref 3.9–10.3)
lymph#: 2 10*3/uL (ref 0.9–3.3)

## 2015-10-28 MED ORDER — SODIUM CHLORIDE 0.9 % IV SOLN
200.0000 mg | Freq: Once | INTRAVENOUS | Status: AC
  Administered 2015-10-28: 200 mg via INTRAVENOUS
  Filled 2015-10-28: qty 8

## 2015-10-28 MED ORDER — SODIUM CHLORIDE 0.9 % IV SOLN
Freq: Once | INTRAVENOUS | Status: AC
  Administered 2015-10-28: 12:00:00 via INTRAVENOUS

## 2015-10-28 NOTE — Patient Instructions (Signed)
[facility] Discharge Instructions for Patients Receiving Chemotherapy  Today you received the following chemotherapy agents:  Keytruda.  To help prevent nausea and vomiting after your treatment, we encourage you to take your nausea medication as prescribed.   If you develop nausea and vomiting that is not controlled by your nausea medication, call the clinic.   BELOW ARE SYMPTOMS THAT SHOULD BE REPORTED IMMEDIATELY:  *FEVER GREATER THAN 100.5 F  *CHILLS WITH OR WITHOUT FEVER  NAUSEA AND VOMITING THAT IS NOT CONTROLLED WITH YOUR NAUSEA MEDICATION  *UNUSUAL SHORTNESS OF BREATH  *UNUSUAL BRUISING OR BLEEDING  TENDERNESS IN MOUTH AND THROAT WITH OR WITHOUT PRESENCE OF ULCERS  *URINARY PROBLEMS  *BOWEL PROBLEMS  UNUSUAL RASH Items with * indicate a potential emergency and should be followed up as soon as possible.  Feel free to call the clinic you have any questions or concerns. The clinic phone number is [phone].  Please show the CHEMO ALERT CARD at check-in to the Emergency Department and triage nurse.   

## 2015-10-28 NOTE — Telephone Encounter (Signed)
per pof to sch pt appt-sent MW email to sch trmt-gave pt copyof avs

## 2015-10-28 NOTE — Progress Notes (Signed)
[facility] Telephone:([phone]   Fax:(336) [phone]  OFFICE PROGRESS NOTE  [name], NP [address] [place] [address]  DIAGNOSIS: Recurrent non-small cell lung cancer, adenocarcinoma with PDL 1 expression 60% diagnosed in November 2016. This was initially diagnosed as Stage IB (T2a, N0, M0) non-small cell lung cancer, adenocarcinoma diagnosed in March 2016.  PRIOR THERAPY: Status post left video-assisted thoracoscopy, wedge resection left lower lobe, thoracoscopic left lower lobectomy and mediastinal lymph node dissection on 08/14/2014.   CURRENT THERAPY: Ketruda 200 mg IV every 3 weeks, first dose expected on 05/12/2015. Status post 8 cycles.  INTERVAL HISTORY: Sharon [name] 55 y.o. female returns to the clinic today for follow-up visit accompanied by her daughter. The patient is feeling fine today with no specific complaints except for mild fatigue and constipation. She tolerated the last cycle of her treatment with immunotherapy with [place] fairly well. She denied having any significant skin rash or diarrhea. She has no chest pain, shortness breath, cough or hemoptysis. She has no fever or chills. She is here to start cycle #9 of her treatment today.  MEDICAL HISTORY: Past Medical History  Diagnosis Date  . Ischemic cardiomyopathy   . Hyperlipidemia   . Tobacco user   . Coronary artery disease   . Occlusion of right coronary artery ([place])   . Aortic insufficiency     moderate by echo in 2009  . Acute ST segment elevation MI ([place])   . Neoplasm of uncertain behavior of left lower lobe of lung     per CT CHEST/PET 2/4 and 07/18/14 @ [place]  . S/P coronary artery stent placement     2009  . COPD (chronic obstructive pulmonary disease) ([place])   . Emphysema of lung ([place])   . Pneumonia     2015   HX BRONCHITIS    . Shortness of breath dyspnea     WITH EXEERTION   . Arthritis   . Cancer ([place])     LUNGS    ALLERGIES:  is allergic to codeine;  lipitor; morphine and related; and oxycodone-acetaminophen.  MEDICATIONS:  Current Outpatient Prescriptions  Medication Sig Dispense Refill  . alendronate (FOSAMAX) 70 MG tablet Take 70 mg by mouth once a week.  0  . aspirin (ASPIR-LOW) 81 MG EC tablet Take 81 mg by mouth daily. On hold    . buPROPion (WELLBUTRIN SR) 150 MG 12 hr tablet Take 150 mg by mouth 2 (two) times daily.  2  . clopidogrel (PLAVIX) 75 MG tablet Take 1 tablet (75 mg total) by mouth daily. 90 tablet 3  . levothyroxine (SYNTHROID) 50 MCG tablet Take 1 tablet (50 mcg total) by mouth daily before breakfast. 30 tablet 1  . ondansetron (ZOFRAN) 8 MG tablet Take 1 tablet (8 mg total) by mouth every 8 (eight) hours as needed for nausea or vomiting. 20 tablet 0  . rosuvastatin (CRESTOR) 10 MG tablet Take 1 tablet (10 mg total) by mouth daily. 90 tablet 3  . traMADol (ULTRAM) 50 MG tablet Take 1 tablet (50 mg total) by mouth every 6 (six) hours as needed. 60 tablet 0   No current facility-administered medications for this visit.    SURGICAL HISTORY:  Past Surgical History  Procedure Laterality Date  . Cholecystectomy    . Tubal ligation    . Ectopic pregnancies requiring laparotomies    . Cardiac catheterization  10/15/2007    EF 30-40%  . Coronary angioplasty with stent  placement      LAD  . US echocardiography  01/07/2008    EF 55-60%  . Transthoracic echocardiogram  10/17/2007  . Cesarean section    . Shoulder surgery      LEFT X 2   (REMOVED SOME COLLAR BONE )  . Video assisted thoracoscopy (vats)/wedge resection Left 08/14/2014    Procedure: VIDEO ASSISTED THORACOSCOPY (VATS)/WEDGE RESECTION;  Surgeon: Melrose Nakayama, MD;  Location: [facility] [place] [facility];  Service: Thoracic;  Laterality: Left;  . Lobectomy Left 08/14/2014    Procedure: LEFT LOWER LOBECTOMY;  Surgeon: Melrose Nakayama, MD;  Location: [facility];  Service: Thoracic;  Laterality: Left;  . Lead removal Left 08/14/2014    Procedure: CRYO INTERCOSTAL NERVE BLOCK;   Surgeon: Melrose Nakayama, MD;  Location: [facility];  Service: Thoracic;  Laterality: Left;  . Node dissection Left 08/14/2014    Procedure: NODE DISSECTION, LEFT LOWER LOBE LUNG;  Surgeon: Melrose Nakayama, MD;  Location: [facility];  Service: Thoracic;  Laterality: Left;    REVIEW OF SYSTEMS:  A comprehensive review of systems was negative except for: Constitutional: positive for fatigue Gastrointestinal: positive for constipation   PHYSICAL EXAMINATION: General appearance: alert, cooperative and no distress Head: Normocephalic, without obvious abnormality, atraumatic Neck: no adenopathy, no JVD, supple, symmetrical, trachea midline and thyroid not enlarged, symmetric, no tenderness/mass/nodules Lymph nodes: Cervical, supraclavicular, and axillary nodes normal. Resp: clear to auscultation bilaterally Back: symmetric, no curvature. ROM normal. No CVA tenderness. Cardio: regular rate and rhythm, S1, S2 normal, no murmur, click, rub or gallop GI: soft, non-tender; bowel sounds normal; no masses,  no organomegaly Extremities: extremities normal, atraumatic, no cyanosis or edema Neurologic: Alert and oriented X 3, normal strength and tone. Normal symmetric reflexes. Normal coordination and gait  ECOG PERFORMANCE STATUS: 1 - Symptomatic but completely ambulatory  Blood pressure 123/59, pulse 72, temperature 98.6 F (37 C), temperature source Oral, resp. rate 18, height '5\' 4"'$  (1.626 m), weight 145 lb 1.6 oz (65.817 kg), SpO2 100 %.  LABORATORY DATA: Lab Results  Component Value Date   WBC 6.4 10/28/2015   HGB 15.0 10/28/2015   HCT 44.6 10/28/2015   MCV 98.3 10/28/2015   PLT 201 10/28/2015      Chemistry      Component Value Date/Time   NA 139 10/28/2015 0945   NA 138 08/16/2014 0520   K 4.0 10/28/2015 0945   K 3.9 08/16/2014 0520   CL 109 08/16/2014 0520   CO2 23 10/28/2015 0945   CO2 25 08/16/2014 0520   BUN 10.5 10/28/2015 0945   BUN 8 08/16/2014 0520   CREATININE 0.9  10/28/2015 0945   CREATININE 0.59 08/16/2014 0520      Component Value Date/Time   CALCIUM 9.5 10/28/2015 0945   CALCIUM 8.1* 08/16/2014 0520   ALKPHOS 81 10/28/2015 0945   ALKPHOS 53 08/16/2014 0520   AST 23 10/28/2015 0945   AST 27 08/16/2014 0520   ALT 15 10/28/2015 0945   ALT 20 08/16/2014 0520   BILITOT 0.57 10/28/2015 0945   BILITOT 0.7 08/16/2014 0520       RADIOGRAPHIC STUDIES: No results found.  ASSESSMENT AND PLAN: This is a very pleasant 55 years old white female with history of recurrent/metastatic non-small cell lung cancer , adenocarcinoma in November 2016 initially diagnosed as stage IB non-small cell lung cancer status post left lower lobectomy with mediastinal lymph node dissection with evidence of pleural invasion and has been on observation. Unfortunately the patient has evidence  for disease recurrence and the recent CT scan of the chest showed new small left pleural effusion as well as new pleural based nodules worrisome for transpleural spread of tumor concerning for disease recurrence. The PET scan performed recently shows also numerous new small hypermetabolic soft tissue nodules throughout the left pleural space and small tiny left pleural effusion as well as hypermetabolic mediastinal lymphadenopathy. The patient is currently on treatment with immunotherapy with Ketruda (pembrolizumab) status post 8 cycles. I recommended for the patient to proceed with cycle #9 today. 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 For smoke cessation, I strongly encouraged the patient to quit smoking and offered her to smoke cessation program. I will see the patient back for follow-up visit in 3 weeks for reevaluation before starting cycle #10 after repeating CT scan of the chest, abdomen and pelvis for restaging of her disease. The patient was advised to call immediately if she has any concerning symptoms in the interval. The patient  voices understanding of current disease status and treatment options and is in agreement with the current care plan.  All questions were answered. The patient knows to call the clinic with any problems, questions or concerns. We can certainly see the patient much sooner if necessary.  Disclaimer: This note was dictated with voice recognition software. Similar sounding words can inadvertently be transcribed and may not be corrected upon review.

## 2015-10-28 NOTE — Telephone Encounter (Signed)
Per staff message and POF I have scheduled appts. Advised scheduler of appts. JMW  

## 2015-11-17 ENCOUNTER — Ambulatory Visit (HOSPITAL_COMMUNITY)
Admission: RE | Admit: 2015-11-17 | Discharge: 2015-11-17 | Disposition: A | Discharge status: Home / Self Care | Payer: BLUE CROSS/BLUE SHIELD | Source: Ambulatory Visit | Attending: Internal Medicine | Admitting: Internal Medicine

## 2015-11-17 ENCOUNTER — Encounter (HOSPITAL_COMMUNITY): Payer: Self-pay

## 2015-11-17 DIAGNOSIS — Z9225 Personal history of immunosupression therapy: Secondary | ICD-10-CM | POA: Insufficient documentation

## 2015-11-17 DIAGNOSIS — R911 Solitary pulmonary nodule: Secondary | ICD-10-CM | POA: Insufficient documentation

## 2015-11-17 DIAGNOSIS — Z902 Acquired absence of lung [part of]: Secondary | ICD-10-CM | POA: Diagnosis not present

## 2015-11-17 DIAGNOSIS — Z5112 Encounter for antineoplastic immunotherapy: Secondary | ICD-10-CM

## 2015-11-17 DIAGNOSIS — J439 Emphysema, unspecified: Secondary | ICD-10-CM | POA: Diagnosis not present

## 2015-11-17 DIAGNOSIS — C3432 Malignant neoplasm of lower lobe, left bronchus or lung: Secondary | ICD-10-CM | POA: Diagnosis present

## 2015-11-17 DIAGNOSIS — I700 Atherosclerosis of aorta: Secondary | ICD-10-CM | POA: Diagnosis not present

## 2015-11-17 MED ORDER — DIATRIZOATE MEGLUMINE & SODIUM 66-10 % PO SOLN: 30.0000 mL | Freq: Once | ORAL | Status: DC

## 2015-11-17 MED ORDER — IOPAMIDOL (ISOVUE-300) INJECTION 61%
100.0000 mL | Freq: Once | INTRAVENOUS | Status: AC | PRN
  Administered 2015-11-17: 100 mL via INTRAVENOUS

## 2015-11-17 MED ORDER — DIATRIZOATE MEGLUMINE & SODIUM 66-10 % PO SOLN
30.0000 mL | ORAL | Status: DC | PRN
Start: 2015-11-17 — End: 2015-11-18
  Administered 2015-11-17: 30 mL via ORAL
  Filled 2015-11-17: qty 30

## 2015-11-18 ENCOUNTER — Other Ambulatory Visit: Payer: Self-pay | Admitting: Internal Medicine

## 2015-11-19 ENCOUNTER — Telehealth: Payer: Self-pay | Admitting: Internal Medicine

## 2015-11-19 ENCOUNTER — Ambulatory Visit (HOSPITAL_BASED_OUTPATIENT_CLINIC_OR_DEPARTMENT_OTHER): Payer: BLUE CROSS/BLUE SHIELD | Admitting: Internal Medicine

## 2015-11-19 ENCOUNTER — Ambulatory Visit (HOSPITAL_BASED_OUTPATIENT_CLINIC_OR_DEPARTMENT_OTHER): Payer: BLUE CROSS/BLUE SHIELD

## 2015-11-19 ENCOUNTER — Other Ambulatory Visit (HOSPITAL_BASED_OUTPATIENT_CLINIC_OR_DEPARTMENT_OTHER): Payer: BLUE CROSS/BLUE SHIELD

## 2015-11-19 ENCOUNTER — Other Ambulatory Visit: Payer: Self-pay | Admitting: Internal Medicine

## 2015-11-19 ENCOUNTER — Encounter: Payer: Self-pay | Admitting: Internal Medicine

## 2015-11-19 VITALS — BP 144/60 | HR 64 | Temp 98.4°F | Resp 18 | Ht 64.0 in | Wt 145.0 lb

## 2015-11-19 DIAGNOSIS — C3432 Malignant neoplasm of lower lobe, left bronchus or lung: Secondary | ICD-10-CM

## 2015-11-19 DIAGNOSIS — C3412 Malignant neoplasm of upper lobe, left bronchus or lung: Secondary | ICD-10-CM

## 2015-11-19 DIAGNOSIS — R599 Enlarged lymph nodes, unspecified: Secondary | ICD-10-CM

## 2015-11-19 DIAGNOSIS — Z5112 Encounter for antineoplastic immunotherapy: Secondary | ICD-10-CM

## 2015-11-19 DIAGNOSIS — R911 Solitary pulmonary nodule: Secondary | ICD-10-CM

## 2015-11-19 DIAGNOSIS — E039 Hypothyroidism, unspecified: Secondary | ICD-10-CM

## 2015-11-19 DIAGNOSIS — Z720 Tobacco use: Secondary | ICD-10-CM

## 2015-11-19 DIAGNOSIS — M799 Soft tissue disorder, unspecified: Secondary | ICD-10-CM

## 2015-11-19 DIAGNOSIS — J90 Pleural effusion, not elsewhere classified: Secondary | ICD-10-CM | POA: Diagnosis not present

## 2015-11-19 LAB — CBC WITH DIFFERENTIAL/PLATELET
BASO%: 1 % (ref 0.0–2.0)
BASOS ABS: 0.1 10*3/uL (ref 0.0–0.1)
EOS ABS: 0.1 10*3/uL (ref 0.0–0.5)
EOS%: 1.5 % (ref 0.0–7.0)
HCT: 44.2 % (ref 34.8–46.6)
HGB: 14.8 g/dL (ref 11.6–15.9)
LYMPH%: 29.4 % (ref 14.0–49.7)
MCH: 33.4 pg (ref 25.1–34.0)
MCHC: 33.3 g/dL (ref 31.5–36.0)
MCV: 100.2 fL (ref 79.5–101.0)
MONO#: 0.4 10*3/uL (ref 0.1–0.9)
MONO%: 5.7 % (ref 0.0–14.0)
NEUT#: 4.6 10*3/uL (ref 1.5–6.5)
NEUT%: 62.4 % (ref 38.4–76.8)
PLATELETS: 178 10*3/uL (ref 145–400)
RBC: 4.42 10*6/uL (ref 3.70–5.45)
RDW: 15.6 % — ABNORMAL HIGH (ref 11.2–14.5)
WBC: 7.3 10*3/uL (ref 3.9–10.3)
lymph#: 2.2 10*3/uL (ref 0.9–3.3)

## 2015-11-19 LAB — COMPREHENSIVE METABOLIC PANEL
ALT: 17 U/L (ref 0–55)
ANION GAP: 10 meq/L (ref 3–11)
AST: 26 U/L (ref 5–34)
Albumin: 3.5 g/dL (ref 3.5–5.0)
Alkaline Phosphatase: 81 U/L (ref 40–150)
BILIRUBIN TOTAL: 0.33 mg/dL (ref 0.20–1.20)
BUN: 8.8 mg/dL (ref 7.0–26.0)
CHLORIDE: 111 meq/L — AB (ref 98–109)
CO2: 22 meq/L (ref 22–29)
Calcium: 9.3 mg/dL (ref 8.4–10.4)
Creatinine: 0.8 mg/dL (ref 0.6–1.1)
EGFR: 85 mL/min/{1.73_m2} — AB (ref >90)
Glucose: 103 mg/dl (ref 70–140)
Potassium: 4.1 mEq/L (ref 3.5–5.1)
Sodium: 142 mEq/L (ref 136–145)
Total Protein: 7.5 g/dL (ref 6.4–8.3)

## 2015-11-19 LAB — TSH: TSH: 48.502 m(IU)/L — ABNORMAL HIGH (ref 0.308–3.960)

## 2015-11-19 MED ORDER — SODIUM CHLORIDE 0.9 % IV SOLN
Freq: Once | INTRAVENOUS | Status: AC
  Administered 2015-11-19: 13:00:00 via INTRAVENOUS

## 2015-11-19 MED ORDER — SODIUM CHLORIDE 0.9 % IV SOLN
200.0000 mg | Freq: Once | INTRAVENOUS | Status: AC
  Administered 2015-11-19: 200 mg via INTRAVENOUS
  Filled 2015-11-19: qty 8

## 2015-11-19 MED ORDER — LEVOTHYROXINE SODIUM 88 MCG PO TABS: 88.0000 ug | ORAL_TABLET | Freq: Every day | ORAL | Status: DC

## 2015-11-19 NOTE — Patient Instructions (Signed)
[facility] Discharge Instructions for Patients Receiving Chemotherapy  Today you received the following chemotherapy agents: Keytruda   To help prevent nausea and vomiting after your treatment, we encourage you to take your nausea medication as directed    If you develop nausea and vomiting that is not controlled by your nausea medication, call the clinic.   BELOW ARE SYMPTOMS THAT SHOULD BE REPORTED IMMEDIATELY:  *FEVER GREATER THAN 100.5 F  *CHILLS WITH OR WITHOUT FEVER  NAUSEA AND VOMITING THAT IS NOT CONTROLLED WITH YOUR NAUSEA MEDICATION  *UNUSUAL SHORTNESS OF BREATH  *UNUSUAL BRUISING OR BLEEDING  TENDERNESS IN MOUTH AND THROAT WITH OR WITHOUT PRESENCE OF ULCERS  *URINARY PROBLEMS  *BOWEL PROBLEMS  UNUSUAL RASH Items with * indicate a potential emergency and should be followed up as soon as possible.  Feel free to call the clinic you have any questions or concerns. The clinic phone number is [phone].  Please show the CHEMO ALERT CARD at check-in to the Emergency Department and triage nurse.   

## 2015-11-19 NOTE — Progress Notes (Signed)
[place] [phone]   [phone]  OFFICE PROGRESS NOTE  [name], NP [address] [place] [address]  DIAGNOSIS: Recurrent non-small cell lung cancer, adenocarcinoma with PDL 1 expression 60% diagnosed in November 2016. This was initially diagnosed as Stage IB (T2a, N0, M0) non-small cell lung cancer, adenocarcinoma diagnosed in March 2016.  PRIOR THERAPY: Status post left video-assisted thoracoscopy, wedge resection left lower lobe, thoracoscopic left lower lobectomy and mediastinal lymph node dissection on 08/14/2014.   CURRENT THERAPY: Ketruda 200 mg IV every 3 weeks, first dose expected on 05/12/2015. Status post 9 cycles.  INTERVAL HISTORY: Sharon [name] 55 y.o. female returns to the clinic today for follow-up visit accompanied by her daughter. The patient is feeling fine today with no specific complaints. She tolerated the last cycle of her treatment with immunotherapy with [place] fairly well. She denied having any significant skin rash or diarrhea. She has no chest pain, shortness breath, cough or hemoptysis. She has no fever or chills. She had repeat CT scan of the chest, abdomen and pelvis performed recently and she is here for evaluation and discussion of her scan results.  MEDICAL HISTORY: Past Medical History  Diagnosis Date  . Ischemic cardiomyopathy   . Hyperlipidemia   . Tobacco user   . Coronary artery disease   . Occlusion of right coronary artery ([place])   . Aortic insufficiency     moderate by echo in 2009  . Acute ST segment elevation MI ([place])   . Neoplasm of uncertain behavior of left lower lobe of lung     per CT CHEST/PET 2/4 and 07/18/14 @ [place]  . S/P coronary artery stent placement     2009  . COPD (chronic obstructive pulmonary disease) ([place])   . Emphysema of lung ([place])   . Pneumonia     2015   HX BRONCHITIS    . Shortness of breath dyspnea     WITH EXEERTION   . Arthritis   . Cancer ([place])    LUNGS    ALLERGIES:  is allergic to codeine; lipitor; morphine and related; and oxycodone-acetaminophen.  MEDICATIONS:  Current Outpatient Prescriptions  Medication Sig Dispense Refill  . aspirin (ASPIR-LOW) 81 MG EC tablet Take 81 mg by mouth daily. On hold    . clopidogrel (PLAVIX) 75 MG tablet Take 1 tablet (75 mg total) by mouth daily. 90 tablet 3  . levothyroxine (SYNTHROID, LEVOTHROID) 50 MCG tablet TAKE 1 TABLET (50 MCG TOTAL) BY MOUTH DAILY BEFORE BREAKFAST. 30 tablet 1  . ondansetron (ZOFRAN) 8 MG tablet TAKE 1 TABLET (8 MG TOTAL) BY MOUTH EVERY 8 (EIGHT) HOURS AS NEEDED FOR NAUSEA OR VOMITING. 20 tablet 0  . rosuvastatin (CRESTOR) 20 MG tablet Take 20 mg by mouth daily.  0  . traMADol (ULTRAM) 50 MG tablet Take 1 tablet (50 mg total) by mouth every 6 (six) hours as needed. 60 tablet 0  . alendronate (FOSAMAX) 70 MG tablet Take 70 mg by mouth once a week. Reported on 11/19/2015  0  . buPROPion (WELLBUTRIN SR) 150 MG 12 hr tablet Take 150 mg by mouth 2 (two) times daily. Reported on 11/19/2015  2   No current facility-administered medications for this visit.    SURGICAL HISTORY:  Past Surgical History  Procedure Laterality Date  . Cholecystectomy    . Tubal ligation    . Ectopic pregnancies requiring laparotomies    . Cardiac catheterization  10/15/2007  EF 30-40%  . Coronary angioplasty with stent placement      LAD  . US echocardiography  01/07/2008    EF 55-60%  . Transthoracic echocardiogram  10/17/2007  . Cesarean section    . Shoulder surgery      LEFT X 2   (REMOVED SOME COLLAR BONE )  . Video assisted thoracoscopy (vats)/wedge resection Left 08/14/2014    Procedure: VIDEO ASSISTED THORACOSCOPY (VATS)/WEDGE RESECTION;  Surgeon: Melrose Nakayama, MD;  Location: [place];  Service: Thoracic;  Laterality: Left;  . Lobectomy Left 08/14/2014    Procedure: LEFT LOWER LOBECTOMY;  Surgeon: Melrose Nakayama, MD;  Location: [place];  Service: Thoracic;  Laterality: Left;  .  Lead removal Left 08/14/2014    Procedure: CRYO INTERCOSTAL NERVE BLOCK;  Surgeon: Melrose Nakayama, MD;  Location: [place];  Service: Thoracic;  Laterality: Left;  . Node dissection Left 08/14/2014    Procedure: NODE DISSECTION, LEFT LOWER LOBE LUNG;  Surgeon: Melrose Nakayama, MD;  Location: [facility];  Service: Thoracic;  Laterality: Left;    REVIEW OF SYSTEMS:  Constitutional: negative Eyes: negative Ears, nose, mouth, throat, and face: negative Respiratory: negative Cardiovascular: negative Gastrointestinal: negative Genitourinary:negative Integument/breast: negative Hematologic/lymphatic: negative Musculoskeletal:negative Neurological: negative Behavioral/Psych: negative Endocrine: negative Allergic/Immunologic: negative   PHYSICAL EXAMINATION: General appearance: alert, cooperative and no distress Head: Normocephalic, without obvious abnormality, atraumatic Neck: no adenopathy, no JVD, supple, symmetrical, trachea midline and thyroid not enlarged, symmetric, no tenderness/mass/nodules Lymph nodes: Cervical, supraclavicular, and axillary nodes normal. Resp: clear to auscultation bilaterally Back: symmetric, no curvature. ROM normal. No CVA tenderness. Cardio: regular rate and rhythm, S1, S2 normal, no murmur, click, rub or gallop GI: soft, non-tender; bowel sounds normal; no masses,  no organomegaly Extremities: extremities normal, atraumatic, no cyanosis or edema Neurologic: Alert and oriented X 3, normal strength and tone. Normal symmetric reflexes. Normal coordination and gait  ECOG PERFORMANCE STATUS: 1 - Symptomatic but completely ambulatory  Blood pressure 144/60, pulse 64, temperature 98.4 F (36.9 C), temperature source Oral, resp. rate 18, height '5\' 4"'$  (1.626 m), weight 145 lb (65.772 kg), SpO2 99 %.  LABORATORY DATA: Lab Results  Component Value Date   WBC 7.3 11/19/2015   HGB 14.8 11/19/2015   HCT 44.2 11/19/2015   MCV 100.2 11/19/2015   PLT 178 11/19/2015       Chemistry      Component Value Date/Time   NA 142 11/19/2015 1114   NA 138 08/16/2014 0520   K 4.1 11/19/2015 1114   K 3.9 08/16/2014 0520   CL 109 08/16/2014 0520   CO2 22 11/19/2015 1114   CO2 25 08/16/2014 0520   BUN 8.8 11/19/2015 1114   BUN 8 08/16/2014 0520   CREATININE 0.8 11/19/2015 1114   CREATININE 0.59 08/16/2014 0520      Component Value Date/Time   CALCIUM 9.3 11/19/2015 1114   CALCIUM 8.1* 08/16/2014 0520   ALKPHOS 81 11/19/2015 1114   ALKPHOS 53 08/16/2014 0520   AST 26 11/19/2015 1114   AST 27 08/16/2014 0520   ALT 17 11/19/2015 1114   ALT 20 08/16/2014 0520   BILITOT 0.33 11/19/2015 1114   BILITOT 0.7 08/16/2014 0520       RADIOGRAPHIC STUDIES: Ct Chest W Contrast  11/17/2015  CLINICAL DATA:  Left lung cancer. Status post left lower lobectomy for stage IB non-small cell carcinoma in March 2016. EXAM: CT CHEST, ABDOMEN, AND PELVIS WITH CONTRAST TECHNIQUE: Multidetector CT imaging of the chest, abdomen and  pelvis was performed following the standard protocol during bolus administration of intravenous contrast. CONTRAST:  15m ISOVUE-300 IOPAMIDOL (ISOVUE-300) INJECTION 61% COMPARISON:  09/14/2015 FINDINGS: CT CHEST FINDINGS Mediastinum/Lymph Nodes: There is no axillary lymphadenopathy. 12 mm short axis high right paratracheal lymph node measures 13 mm short axis today. 10 mm short axis prevascular lymph node measured on the previous study is now 9 mm. No other mediastinal lymphadenopathy. No hilar lymphadenopathy. The heart size is normal. No pericardial effusion. Coronary artery calcification is noted. The esophagus has normal imaging features. Lungs/Pleura: Lungs show centrilobular and paraseptal emphysema bilaterally. 3 mm right upper lobe pulmonary nodule is stable in the interval (Image 32 series 5). No change right middle lobe scarring. Stable chronic atelectasis or scarring in the posterior left lower lung. No focal airspace consolidation. No pulmonary  edema or pleural effusion. Suture line anterior left lung is again noted. Musculoskeletal: Bone windows reveal no worrisome lytic or sclerotic osseous lesions. CT ABDOMEN PELVIS FINDINGS Hepatobiliary: No focal abnormality within the liver parenchyma. Gallbladder surgically absent. No intrahepatic or extrahepatic biliary dilation. Pancreas: No focal mass lesion. No dilatation of the main duct. No intraparenchymal cyst. No peripancreatic edema. Spleen: No splenomegaly. No focal mass lesion. Adrenals/Urinary Tract: No adrenal nodule or mass. Kidneys are unremarkable. No evidence for hydroureter. Tiny gas bubbles are seen in the urinary bladder. Stomach/Bowel: Stomach is nondistended. No gastric wall thickening. No evidence of outlet obstruction. Duodenum is normally positioned as is the ligament of Treitz. No small bowel wall thickening. No small bowel dilatation. The terminal ileum is normal. The appendix is normal. No gross colonic mass. No colonic wall thickening. No substantial diverticular change. Vascular/Lymphatic: There is abdominal aortic atherosclerosis without aneurysm. There is no gastrohepatic or hepatoduodenal ligament lymphadenopathy. No intraperitoneal or retroperitoneal lymphadenopathy. No pelvic sidewall lymphadenopathy. Reproductive: Uterus is normal.  There is no adnexal mass. Other: No intraperitoneal free fluid. Musculoskeletal: Bone windows reveal no worrisome lytic or sclerotic osseous lesions. IMPRESSION: 1. Tiny gas bubbles in the urinary bladder are new in the interval. These are presumably related to recent instrumentation although bladder infection could have this appearance. 2. No substantial interval change in mild mediastinal lymphadenopathy in this patient status post left lower lobectomy for stage IB non-small cell lung cancer in March 2016. 3. The tiny right upper lobe pulmonary nodule is unchanged. 4. Emphysema. 5. Coronary artery and thoracoabdominal aortic atherosclerosis.  Electronically Signed   By: EMisty StanleyM.D.   On: 11/17/2015 16:37   Ct Abdomen Pelvis W Contrast  11/17/2015  CLINICAL DATA:  Left lung cancer. Status post left lower lobectomy for stage IB non-small cell carcinoma in March 2016. EXAM: CT CHEST, ABDOMEN, AND PELVIS WITH CONTRAST TECHNIQUE: Multidetector CT imaging of the chest, abdomen and pelvis was performed following the standard protocol during bolus administration of intravenous contrast. CONTRAST:  1062mISOVUE-300 IOPAMIDOL (ISOVUE-300) INJECTION 61% COMPARISON:  09/14/2015 FINDINGS: CT CHEST FINDINGS Mediastinum/Lymph Nodes: There is no axillary lymphadenopathy. 12 mm short axis high right paratracheal lymph node measures 13 mm short axis today. 10 mm short axis prevascular lymph node measured on the previous study is now 9 mm. No other mediastinal lymphadenopathy. No hilar lymphadenopathy. The heart size is normal. No pericardial effusion. Coronary artery calcification is noted. The esophagus has normal imaging features. Lungs/Pleura: Lungs show centrilobular and paraseptal emphysema bilaterally. 3 mm right upper lobe pulmonary nodule is stable in the interval (Image 32 series 5). No change right middle lobe scarring. Stable chronic atelectasis or scarring in the  posterior left lower lung. No focal airspace consolidation. No pulmonary edema or pleural effusion. Suture line anterior left lung is again noted. Musculoskeletal: Bone windows reveal no worrisome lytic or sclerotic osseous lesions. CT ABDOMEN PELVIS FINDINGS Hepatobiliary: No focal abnormality within the liver parenchyma. Gallbladder surgically absent. No intrahepatic or extrahepatic biliary dilation. Pancreas: No focal mass lesion. No dilatation of the main duct. No intraparenchymal cyst. No peripancreatic edema. Spleen: No splenomegaly. No focal mass lesion. Adrenals/Urinary Tract: No adrenal nodule or mass. Kidneys are unremarkable. No evidence for hydroureter. Tiny gas bubbles are seen  in the urinary bladder. Stomach/Bowel: Stomach is nondistended. No gastric wall thickening. No evidence of outlet obstruction. Duodenum is normally positioned as is the ligament of Treitz. No small bowel wall thickening. No small bowel dilatation. The terminal ileum is normal. The appendix is normal. No gross colonic mass. No colonic wall thickening. No substantial diverticular change. Vascular/Lymphatic: There is abdominal aortic atherosclerosis without aneurysm. There is no gastrohepatic or hepatoduodenal ligament lymphadenopathy. No intraperitoneal or retroperitoneal lymphadenopathy. No pelvic sidewall lymphadenopathy. Reproductive: Uterus is normal.  There is no adnexal mass. Other: No intraperitoneal free fluid. Musculoskeletal: Bone windows reveal no worrisome lytic or sclerotic osseous lesions. IMPRESSION: 1. Tiny gas bubbles in the urinary bladder are new in the interval. These are presumably related to recent instrumentation although bladder infection could have this appearance. 2. No substantial interval change in mild mediastinal lymphadenopathy in this patient status post left lower lobectomy for stage IB non-small cell lung cancer in March 2016. 3. The tiny right upper lobe pulmonary nodule is unchanged. 4. Emphysema. 5. Coronary artery and thoracoabdominal aortic atherosclerosis. Electronically Signed   By: Misty Stanley M.D.   On: 11/17/2015 16:37    ASSESSMENT AND PLAN: This is a very pleasant 55 years old white female with history of recurrent/metastatic non-small cell lung cancer , adenocarcinoma in November 2016 initially diagnosed as stage IB non-small cell lung cancer status post left lower lobectomy with mediastinal lymph node dissection with evidence of pleural invasion and has been on observation. Unfortunately the patient has evidence for disease recurrence and the recent CT scan of the chest showed new small left pleural effusion as well as new pleural based nodules worrisome for  transpleural spread of tumor concerning for disease recurrence. The PET scan performed recently shows also numerous new small hypermetabolic soft tissue nodules throughout the left pleural space and small tiny left pleural effusion as well as hypermetabolic mediastinal lymphadenopathy. The patient is currently on treatment with immunotherapy with Ketruda (pembrolizumab) status post 9 cycles. The recent CT scan of the chest, abdomen and pelvis showed no evidence for disease progression. There is some tiny gas bubbles in the urinary bladder but the patient has no urinary symptoms or recent instrumentation.  I recommended for the patient to continue with her treatment with immunotherapy and she will proceed with cycle #10 today. 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 For smoke cessation, I strongly encouraged the patient to quit smoking and offered her to smoke cessation program. I will see the patient back for follow-up visit in 3 weeks for reevaluation before starting cycle #11. The patient was advised to call immediately if she has any concerning symptoms in the interval. The patient voices understanding of current disease status and treatment options and is in agreement with the current care plan.  All questions were answered. The patient knows to call the clinic with any problems, questions or concerns. We can certainly see  the patient much sooner if necessary.  Disclaimer: This note was dictated with voice recognition software. Similar sounding words can inadvertently be transcribed and may not be corrected upon review.

## 2015-11-19 NOTE — Telephone Encounter (Signed)
Gave and pritned appt sched and avs for pt for July and Aug

## 2015-12-09 ENCOUNTER — Other Ambulatory Visit (HOSPITAL_BASED_OUTPATIENT_CLINIC_OR_DEPARTMENT_OTHER): Payer: BLUE CROSS/BLUE SHIELD

## 2015-12-09 ENCOUNTER — Ambulatory Visit (HOSPITAL_BASED_OUTPATIENT_CLINIC_OR_DEPARTMENT_OTHER): Payer: BLUE CROSS/BLUE SHIELD

## 2015-12-09 ENCOUNTER — Ambulatory Visit (HOSPITAL_BASED_OUTPATIENT_CLINIC_OR_DEPARTMENT_OTHER): Payer: BLUE CROSS/BLUE SHIELD | Admitting: Nurse Practitioner

## 2015-12-09 VITALS — BP 147/58 | HR 71 | Temp 97.5°F | Resp 18 | Ht 64.0 in | Wt 148.9 lb

## 2015-12-09 DIAGNOSIS — Z5112 Encounter for antineoplastic immunotherapy: Secondary | ICD-10-CM

## 2015-12-09 DIAGNOSIS — C3412 Malignant neoplasm of upper lobe, left bronchus or lung: Secondary | ICD-10-CM

## 2015-12-09 DIAGNOSIS — C3432 Malignant neoplasm of lower lobe, left bronchus or lung: Secondary | ICD-10-CM | POA: Diagnosis not present

## 2015-12-09 LAB — CBC WITH DIFFERENTIAL/PLATELET
BASO%: 1.1 % (ref 0.0–2.0)
Basophils Absolute: 0.1 10*3/uL (ref 0.0–0.1)
EOS%: 1.9 % (ref 0.0–7.0)
Eosinophils Absolute: 0.1 10*3/uL (ref 0.0–0.5)
HEMATOCRIT: 42.6 % (ref 34.8–46.6)
HEMOGLOBIN: 14.3 g/dL (ref 11.6–15.9)
LYMPH#: 2.1 10*3/uL (ref 0.9–3.3)
LYMPH%: 36.5 % (ref 14.0–49.7)
MCH: 34 pg (ref 25.1–34.0)
MCHC: 33.6 g/dL (ref 31.5–36.0)
MCV: 101.4 fL — AB (ref 79.5–101.0)
MONO#: 0.4 10*3/uL (ref 0.1–0.9)
MONO%: 7.2 % (ref 0.0–14.0)
NEUT%: 53.3 % (ref 38.4–76.8)
NEUTROS ABS: 3.1 10*3/uL (ref 1.5–6.5)
PLATELETS: 194 10*3/uL (ref 145–400)
RBC: 4.2 10*6/uL (ref 3.70–5.45)
RDW: 14.1 % (ref 11.2–14.5)
WBC: 5.7 10*3/uL (ref 3.9–10.3)

## 2015-12-09 LAB — COMPREHENSIVE METABOLIC PANEL
ALBUMIN: 3.8 g/dL (ref 3.5–5.0)
ALK PHOS: 84 U/L (ref 40–150)
ALT: 15 U/L (ref 0–55)
AST: 29 U/L (ref 5–34)
Anion Gap: 9 mEq/L (ref 3–11)
BILIRUBIN TOTAL: 0.52 mg/dL (ref 0.20–1.20)
BUN: 11.1 mg/dL (ref 7.0–26.0)
CALCIUM: 9.2 mg/dL (ref 8.4–10.4)
CO2: 24 mEq/L (ref 22–29)
Chloride: 106 mEq/L (ref 98–109)
Creatinine: 0.8 mg/dL (ref 0.6–1.1)
EGFR: 83 mL/min/{1.73_m2} — AB (ref >90)
GLUCOSE: 100 mg/dL (ref 70–140)
POTASSIUM: 3.3 meq/L — AB (ref 3.5–5.1)
Sodium: 138 mEq/L (ref 136–145)
TOTAL PROTEIN: 7.5 g/dL (ref 6.4–8.3)

## 2015-12-09 MED ORDER — SODIUM CHLORIDE 0.9 % IV SOLN
200.0000 mg | Freq: Once | INTRAVENOUS | Status: AC
  Administered 2015-12-09: 200 mg via INTRAVENOUS
  Filled 2015-12-09: qty 8

## 2015-12-09 MED ORDER — SODIUM CHLORIDE 0.9 % IV SOLN
Freq: Once | INTRAVENOUS | Status: AC
  Administered 2015-12-09: 13:00:00 via INTRAVENOUS

## 2015-12-09 NOTE — Patient Instructions (Signed)
Pembrolizumab injection What is this medicine? PEMBROLIZUMAB (pem broe liz ue mab) is a monoclonal antibody. It is used to treat melanoma and non-small cell lung cancer. This medicine may be used for other purposes; ask your health care provider or pharmacist if you have questions. What should I tell my health care provider before I take this medicine? They need to know if you have any of these conditions: -diabetes -immune system problems -inflammatory bowel disease -liver disease -lung or breathing disease -lupus -an unusual or allergic reaction to pembrolizumab, other medicines, foods, dyes, or preservatives -pregnant or trying to get pregnant -breast-feeding How should I use this medicine? This medicine is for infusion into a vein. It is given by a health care professional in a hospital or clinic setting. A special MedGuide will be given to you before each treatment. Be sure to read this information carefully each time. Talk to your pediatrician regarding the use of this medicine in children. Special care may be needed. Overdosage: If you think you have taken too much of this medicine contact a poison control center or emergency room at once. NOTE: This medicine is only for you. Do not share this medicine with others. What if I miss a dose? It is important not to miss your dose. Call your doctor or health care professional if you are unable to keep an appointment. What may interact with this medicine? Interactions have not been studied. Give your health care provider a list of all the medicines, herbs, non-prescription drugs, or dietary supplements you use. Also tell them if you smoke, drink alcohol, or use illegal drugs. Some items may interact with your medicine. This list may not describe all possible interactions. Give your health care provider a list of all the medicines, herbs, non-prescription drugs, or dietary supplements you use. Also tell them if you smoke, drink alcohol, or  use illegal drugs. Some items may interact with your medicine. What should I watch for while using this medicine? Your condition will be monitored carefully while you are receiving this medicine. You may need blood work done while you are taking this medicine. Do not become pregnant while taking this medicine or for 4 months after stopping it. Women should inform their doctor if they wish to become pregnant or think they might be pregnant. There is a potential for serious side effects to an unborn child. Talk to your health care professional or pharmacist for more information. Do not breast-feed an infant while taking this medicine or for 4 months after the last dose. What side effects may I notice from receiving this medicine? Side effects that you should report to your doctor or health care professional as soon as possible: -allergic reactions like skin rash, itching or hives, swelling of the face, lips, or tongue -bloody or black, tarry stools -breathing problems -change in the amount of urine -changes in vision -chest pain -chills -dark urine -dizziness or feeling faint or lightheaded -fast or irregular heartbeat -fever -flushing -hair loss -muscle pain -muscle weakness -persistent headache -signs and symptoms of high blood sugar such as dizziness; dry mouth; dry skin; fruity breath; nausea; stomach pain; increased hunger or thirst; increased urination -signs and symptoms of liver injury like dark urine, light-colored stools, loss of appetite, nausea, right upper belly pain, yellowing of the eyes or skin -stomach pain -weight loss Side effects that usually do not require medical attention (Report these to your doctor or health care professional if they continue or are bothersome.):constipation -cough -diarrhea -joint pain -  tiredness This list may not describe all possible side effects. Call your doctor for medical advice about side effects. You may report side effects to FDA at  1-800-FDA-1088. Where should I keep my medicine? This drug is given in a hospital or clinic and will not be stored at home. NOTE: This sheet is a summary. It may not cover all possible information. If you have questions about this medicine, talk to your doctor, pharmacist, or health care provider.    2016, Elsevier/Gold Standard. (2014-07-15 17:24:19)

## 2015-12-09 NOTE — Progress Notes (Signed)
  [place] OFFICE PROGRESS NOTE   DIAGNOSIS: Recurrent non-small cell lung cancer, adenocarcinoma with PDL 1 expression 60% diagnosed in November 2016. This was initially diagnosed as Stage IB (T2a, N0, M0) non-small cell lung cancer, adenocarcinoma diagnosed in March 2016.  PRIOR THERAPY: Status post left video-assisted thoracoscopy, wedge resection left lower lobe, thoracoscopic left lower lobectomy and mediastinal lymph node dissection on 08/14/2014.   CURRENT THERAPY: Ketruda 200 mg IV every 3 weeks, first dose 05/12/2015. Status post 10 cycles.    INTERVAL HISTORY:   Sharon [name] returns as scheduled. She completed cycle 10 pembrolizumab 11/19/2015. She has long-standing intermittent nausea and "dizzy spells". The dizzy spells have been occurring since at least 2009. No mouth sores. No diarrhea. No rash. She denies shortness of breath. She has an occasional cough. No hemoptysis. No fever. Appetite has improved.  Objective:  Vital signs in last 24 hours:  Blood pressure 147/58, pulse 71, temperature 97.5 F (36.4 C), temperature source Oral, resp. rate 18, height '5\' 4"'$  (1.626 m), weight 148 lb 14.4 oz (67.541 kg), SpO2 100 %.    HEENT: No thrush or ulcers. Resp: Faint rales at both lung bases. No respiratory distress. Cardio: Regular rate and rhythm. GI: Abdomen soft and nontender. No hepatomegaly. Vascular: No leg edema. Neuro: Alert and oriented. Gait normal.  Skin: No rash.    Lab Results:  Lab Results  Component Value Date   WBC 5.7 12/09/2015   HGB 14.3 12/09/2015   HCT 42.6 12/09/2015   MCV 101.4* 12/09/2015   PLT 194 12/09/2015   NEUTROABS 3.1 12/09/2015    Imaging:  No results found.  Medications: I have reviewed the patient's current medications.  Assessment/Plan: 1. Recurrent/metastatic non-small cell lung cancer. Initial diagnosis dates to March 2016, stage IB (T2a, N0, M0) non-small cell lung cancer, adenocarcinoma status post left  lower lobectomy with mediastinal lymph node dissection. Progressive disease November 2016. She is currently on active treatment with pembrolizumab. Restaging CT evaluation 11/17/2015 showed no evidence of disease progression. Cycle 10 completed 11/19/2015.   Disposition: Sharon [name] appears stable. She has completed 10 cycles of Pembrolizumab. Plan to proceed with cycle 11 today as scheduled. She will return for a follow-up visit in 3 weeks. She will contact the office in the interim with any problems.    Ned Card ANP/GNP-BC   12/09/2015  12:51 PM

## 2015-12-30 ENCOUNTER — Other Ambulatory Visit (HOSPITAL_BASED_OUTPATIENT_CLINIC_OR_DEPARTMENT_OTHER): Payer: BLUE CROSS/BLUE SHIELD

## 2015-12-30 ENCOUNTER — Telehealth: Payer: Self-pay | Admitting: Internal Medicine

## 2015-12-30 ENCOUNTER — Ambulatory Visit (HOSPITAL_BASED_OUTPATIENT_CLINIC_OR_DEPARTMENT_OTHER): Payer: BLUE CROSS/BLUE SHIELD

## 2015-12-30 ENCOUNTER — Ambulatory Visit (HOSPITAL_BASED_OUTPATIENT_CLINIC_OR_DEPARTMENT_OTHER): Payer: BLUE CROSS/BLUE SHIELD | Admitting: Internal Medicine

## 2015-12-30 ENCOUNTER — Encounter: Payer: Self-pay | Admitting: Internal Medicine

## 2015-12-30 VITALS — BP 141/76 | HR 77 | Temp 98.7°F | Resp 18 | Ht 64.0 in | Wt 142.1 lb

## 2015-12-30 DIAGNOSIS — J90 Pleural effusion, not elsewhere classified: Secondary | ICD-10-CM

## 2015-12-30 DIAGNOSIS — R911 Solitary pulmonary nodule: Secondary | ICD-10-CM

## 2015-12-30 DIAGNOSIS — R599 Enlarged lymph nodes, unspecified: Secondary | ICD-10-CM | POA: Diagnosis not present

## 2015-12-30 DIAGNOSIS — C3412 Malignant neoplasm of upper lobe, left bronchus or lung: Secondary | ICD-10-CM

## 2015-12-30 DIAGNOSIS — Z5112 Encounter for antineoplastic immunotherapy: Secondary | ICD-10-CM

## 2015-12-30 DIAGNOSIS — Z720 Tobacco use: Secondary | ICD-10-CM

## 2015-12-30 DIAGNOSIS — M799 Soft tissue disorder, unspecified: Secondary | ICD-10-CM | POA: Diagnosis not present

## 2015-12-30 DIAGNOSIS — E039 Hypothyroidism, unspecified: Secondary | ICD-10-CM

## 2015-12-30 LAB — CBC WITH DIFFERENTIAL/PLATELET
BASO%: 0.2 % (ref 0.0–2.0)
Basophils Absolute: 0 10*3/uL (ref 0.0–0.1)
EOS%: 1.3 % (ref 0.0–7.0)
Eosinophils Absolute: 0.1 10*3/uL (ref 0.0–0.5)
HCT: 43.5 % (ref 34.8–46.6)
HGB: 15.1 g/dL (ref 11.6–15.9)
LYMPH%: 29.8 % (ref 14.0–49.7)
MCH: 34.5 pg — AB (ref 25.1–34.0)
MCHC: 34.7 g/dL (ref 31.5–36.0)
MCV: 99.3 fL (ref 79.5–101.0)
MONO#: 0.5 10*3/uL (ref 0.1–0.9)
MONO%: 6.1 % (ref 0.0–14.0)
NEUT#: 5.1 10*3/uL (ref 1.5–6.5)
NEUT%: 62.6 % (ref 38.4–76.8)
Platelets: 187 10*3/uL (ref 145–400)
RBC: 4.38 10*6/uL (ref 3.70–5.45)
RDW: 13.4 % (ref 11.2–14.5)
WBC: 8.2 10*3/uL (ref 3.9–10.3)
lymph#: 2.4 10*3/uL (ref 0.9–3.3)

## 2015-12-30 LAB — COMPREHENSIVE METABOLIC PANEL
ALBUMIN: 3.6 g/dL (ref 3.5–5.0)
ALK PHOS: 84 U/L (ref 40–150)
ALT: 18 U/L (ref 0–55)
ANION GAP: 8 meq/L (ref 3–11)
AST: 26 U/L (ref 5–34)
BILIRUBIN TOTAL: 0.52 mg/dL (ref 0.20–1.20)
BUN: 8.3 mg/dL (ref 7.0–26.0)
CALCIUM: 9.5 mg/dL (ref 8.4–10.4)
CO2: 22 mEq/L (ref 22–29)
Chloride: 110 mEq/L — ABNORMAL HIGH (ref 98–109)
Creatinine: 0.7 mg/dL (ref 0.6–1.1)
Glucose: 94 mg/dl (ref 70–140)
Potassium: 3.7 mEq/L (ref 3.5–5.1)
Sodium: 141 mEq/L (ref 136–145)
TOTAL PROTEIN: 7.2 g/dL (ref 6.4–8.3)

## 2015-12-30 MED ORDER — SODIUM CHLORIDE 0.9 % IV SOLN
200.0000 mg | Freq: Once | INTRAVENOUS | Status: AC
  Administered 2015-12-30: 200 mg via INTRAVENOUS
  Filled 2015-12-30: qty 8

## 2015-12-30 MED ORDER — SODIUM CHLORIDE 0.9 % IV SOLN
Freq: Once | INTRAVENOUS | Status: AC
  Administered 2015-12-30: 12:00:00 via INTRAVENOUS

## 2015-12-30 NOTE — Telephone Encounter (Signed)
per pof to sch pt appt-gave pt copy of avs °

## 2015-12-30 NOTE — Progress Notes (Signed)
[facility] [phone]   [phone]  OFFICE PROGRESS NOTE  [name], NP [address] [place] [address]  DIAGNOSIS: Recurrent non-small cell lung cancer, adenocarcinoma with PDL 1 expression 60% diagnosed in November 2016. This was initially diagnosed as Stage IB (T2a, N0, M0) non-small cell lung cancer, adenocarcinoma diagnosed in March 2016.  PRIOR THERAPY: Status post left video-assisted thoracoscopy, wedge resection left lower lobe, thoracoscopic left lower lobectomy and mediastinal lymph node dissection on 08/14/2014.   CURRENT THERAPY: Ketruda 200 mg IV every 3 weeks, first dose expected on 05/12/2015. Status post 11 cycles.  INTERVAL HISTORY: Sharon [name] 55 y.o. [name] returns to the clinic today for follow-up visit accompanied by her daughter, Sharon [name]. The patient is feeling fine today with no specific complaints. She tolerated the last cycle of her treatment with immunotherapy with [place] fairly well. She is status post 11 cycles. She works at night shift and a little bit tired this morning. She denied having any significant skin rash or diarrhea. She has no chest pain, shortness of breath, cough or hemoptysis. She has no fever or chills. She is here today to start cycle #12.  MEDICAL HISTORY: Past Medical History:  Diagnosis Date  . Acute ST segment elevation MI ([place])   . Aortic insufficiency    moderate by echo in 2009  . Arthritis   . Cancer (HCC)    LUNGS  . COPD (chronic obstructive pulmonary disease) ([place])   . Coronary artery disease   . Emphysema of lung ([place])   . Hyperlipidemia   . Ischemic cardiomyopathy   . Neoplasm of uncertain behavior of left lower lobe of lung    per CT CHEST/PET 2/4 and 07/18/14 @ [facility]  . Occlusion of right coronary artery ([place])   . Pneumonia    2015   HX BRONCHITIS    . S/P coronary artery stent placement    2009  . Shortness of breath dyspnea    WITH EXEERTION   . Tobacco user       ALLERGIES:  is allergic to codeine; lipitor [atorvastatin calcium]; morphine and related; and oxycodone-acetaminophen.  MEDICATIONS:  Current Outpatient Prescriptions  Medication Sig Dispense Refill  . aspirin (ASPIR-LOW) 81 MG EC tablet Take 81 mg by mouth daily. On hold    . clopidogrel (PLAVIX) 75 MG tablet Take 1 tablet (75 mg total) by mouth daily. 90 tablet 3  . levothyroxine (SYNTHROID, LEVOTHROID) 88 MCG tablet Take 1 tablet (88 mcg total) by mouth daily before breakfast. 30 tablet 1  . ondansetron (ZOFRAN) 8 MG tablet TAKE 1 TABLET (8 MG TOTAL) BY MOUTH EVERY 8 (EIGHT) HOURS AS NEEDED FOR NAUSEA OR VOMITING. 20 tablet 0  . rosuvastatin (CRESTOR) 20 MG tablet Take 20 mg by mouth daily.  0  . traMADol (ULTRAM) 50 MG tablet Take 1 tablet (50 mg total) by mouth every 6 (six) hours as needed. 60 tablet 0   No current facility-administered medications for this visit.     SURGICAL HISTORY:  Past Surgical History:  Procedure Laterality Date  . CARDIAC CATHETERIZATION  10/15/2007   EF 30-40%  . CESAREAN SECTION    . CHOLECYSTECTOMY    . CORONARY ANGIOPLASTY WITH STENT PLACEMENT     LAD  . ectopic pregnancies requiring laparotomies    . LEAD REMOVAL Left 08/14/2014   Procedure: CRYO INTERCOSTAL NERVE BLOCK;  Surgeon: Melrose Nakayama, MD;  Location: [facility];  Service: Thoracic;  Laterality: Left;  . LOBECTOMY Left 08/14/2014   Procedure: LEFT LOWER LOBECTOMY;  Surgeon: Melrose Nakayama, MD;  Location: [facility];  Service: Thoracic;  Laterality: Left;  . NODE DISSECTION Left 08/14/2014   Procedure: NODE DISSECTION, LEFT LOWER LOBE LUNG;  Surgeon: Melrose Nakayama, MD;  Location: [facility];  Service: Thoracic;  Laterality: Left;  . SHOULDER SURGERY     LEFT X 2   (REMOVED SOME COLLAR BONE )  . TRANSTHORACIC ECHOCARDIOGRAM  10/17/2007  . TUBAL LIGATION    . US ECHOCARDIOGRAPHY  01/07/2008   EF 55-60%  . VIDEO ASSISTED THORACOSCOPY (VATS)/WEDGE RESECTION Left 08/14/2014   Procedure:  VIDEO ASSISTED THORACOSCOPY (VATS)/WEDGE RESECTION;  Surgeon: Melrose Nakayama, MD;  Location: [facility];  Service: Thoracic;  Laterality: Left;    REVIEW OF SYSTEMS:  A comprehensive review of systems was negative except for: Constitutional: positive for fatigue   PHYSICAL EXAMINATION: General appearance: alert, cooperative and no distress Head: Normocephalic, without obvious abnormality, atraumatic Neck: no adenopathy, no JVD, supple, symmetrical, trachea midline and thyroid not enlarged, symmetric, no tenderness/mass/nodules Lymph nodes: Cervical, supraclavicular, and axillary nodes normal. Resp: clear to auscultation bilaterally Back: symmetric, no curvature. ROM normal. No CVA tenderness. Cardio: regular rate and rhythm, S1, S2 normal, no murmur, click, rub or gallop GI: soft, non-tender; bowel sounds normal; no masses,  no organomegaly Extremities: extremities normal, atraumatic, no cyanosis or edema Neurologic: Alert and oriented X 3, normal strength and tone. Normal symmetric reflexes. Normal coordination and gait  ECOG PERFORMANCE STATUS: 1 - Symptomatic but completely ambulatory  Blood pressure (!) 141/76, pulse 77, temperature 98.7 F (37.1 C), temperature source Oral, resp. rate 18, height '5\' 4"'$  (1.626 m), weight 142 lb 1.6 oz (64.5 kg), SpO2 98 %.  LABORATORY DATA: Lab Results  Component Value Date   WBC 8.2 12/30/2015   HGB 15.1 12/30/2015   HCT 43.5 12/30/2015   MCV 99.3 12/30/2015   PLT 187 12/30/2015      Chemistry      Component Value Date/Time   NA 141 12/30/2015 1108   K 3.7 12/30/2015 1108   CL 109 08/16/2014 0520   CO2 22 12/30/2015 1108   BUN 8.3 12/30/2015 1108   CREATININE 0.7 12/30/2015 1108      Component Value Date/Time   CALCIUM 9.5 12/30/2015 1108   ALKPHOS 84 12/30/2015 1108   AST 26 12/30/2015 1108   ALT 18 12/30/2015 1108   BILITOT 0.52 12/30/2015 1108       RADIOGRAPHIC STUDIES: No results found.  ASSESSMENT AND PLAN: This is a  very pleasant 55 years old white [name] with history of recurrent/metastatic non-small cell lung cancer , adenocarcinoma in November 2016 initially diagnosed as stage IB non-small cell lung cancer status post left lower lobectomy with mediastinal lymph node dissection with evidence of pleural invasion and has been on observation. Unfortunately the patient has evidence for disease recurrence and the recent CT scan of the chest showed new small left pleural effusion as well as new pleural based nodules worrisome for transpleural spread of tumor concerning for disease recurrence. The PET scan performed recently shows also numerous new small hypermetabolic soft tissue nodules throughout the left pleural space and small tiny left pleural effusion as well as hypermetabolic mediastinal lymphadenopathy. The patient is currently on treatment with immunotherapy with Ketruda (pembrolizumab) status post 11 cycles. I recommended for the patient to continue with her treatment with immunotherapy and she will proceed with cycle #12 today. 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 I will check TSH with the next visit lab For smoke cessation, I strongly encouraged the patient to quit smoking and offered her to smoke cessation program. I will see the patient back for follow-up visit in 3 weeks for reevaluation before starting cycle #13 after repeating CT scan of the chest, abdomen and pelvis for restaging of her disease. The patient was advised to call immediately if she has any concerning symptoms in the interval. The patient voices understanding of current disease status and treatment options and is in agreement with the current care plan.  All questions were answered. The patient knows to call the clinic with any problems, questions or concerns. We can certainly see the patient much sooner if necessary.  Disclaimer: This note was dictated with voice recognition software.  Similar sounding words can inadvertently be transcribed and may not be corrected upon review.

## 2015-12-30 NOTE — Patient Instructions (Signed)
[facility] Discharge Instructions for Patients Receiving Chemotherapy  Today you received the following chemotherapy agents Keytruda  To help prevent nausea and vomiting after your treatment, we encourage you to take your nausea medication    If you develop nausea and vomiting that is not controlled by your nausea medication, call the clinic.   BELOW ARE SYMPTOMS THAT SHOULD BE REPORTED IMMEDIATELY:  *FEVER GREATER THAN 100.5 F  *CHILLS WITH OR WITHOUT FEVER  NAUSEA AND VOMITING THAT IS NOT CONTROLLED WITH YOUR NAUSEA MEDICATION  *UNUSUAL SHORTNESS OF BREATH  *UNUSUAL BRUISING OR BLEEDING  TENDERNESS IN MOUTH AND THROAT WITH OR WITHOUT PRESENCE OF ULCERS  *URINARY PROBLEMS  *BOWEL PROBLEMS  UNUSUAL RASH Items with * indicate a potential emergency and should be followed up as soon as possible.  Feel free to call the clinic you have any questions or concerns. The clinic phone number is [phone].  Please show the CHEMO ALERT CARD at check-in to the Emergency Department and triage nurse.   

## 2016-01-16 ENCOUNTER — Other Ambulatory Visit: Payer: Self-pay | Admitting: Internal Medicine

## 2016-01-16 DIAGNOSIS — C3432 Malignant neoplasm of lower lobe, left bronchus or lung: Secondary | ICD-10-CM

## 2016-01-16 DIAGNOSIS — E039 Hypothyroidism, unspecified: Secondary | ICD-10-CM

## 2016-01-20 ENCOUNTER — Telehealth: Payer: Self-pay | Admitting: Internal Medicine

## 2016-01-20 ENCOUNTER — Ambulatory Visit (HOSPITAL_BASED_OUTPATIENT_CLINIC_OR_DEPARTMENT_OTHER): Payer: BLUE CROSS/BLUE SHIELD

## 2016-01-20 ENCOUNTER — Encounter: Payer: Self-pay | Admitting: Internal Medicine

## 2016-01-20 ENCOUNTER — Ambulatory Visit (HOSPITAL_BASED_OUTPATIENT_CLINIC_OR_DEPARTMENT_OTHER): Payer: BLUE CROSS/BLUE SHIELD | Admitting: Internal Medicine

## 2016-01-20 ENCOUNTER — Other Ambulatory Visit (HOSPITAL_BASED_OUTPATIENT_CLINIC_OR_DEPARTMENT_OTHER): Payer: BLUE CROSS/BLUE SHIELD

## 2016-01-20 VITALS — BP 128/57 | HR 73 | Temp 98.5°F | Resp 18 | Ht 64.0 in | Wt 139.1 lb

## 2016-01-20 DIAGNOSIS — C3432 Malignant neoplasm of lower lobe, left bronchus or lung: Secondary | ICD-10-CM

## 2016-01-20 DIAGNOSIS — R911 Solitary pulmonary nodule: Secondary | ICD-10-CM

## 2016-01-20 DIAGNOSIS — E034 Atrophy of thyroid (acquired): Secondary | ICD-10-CM

## 2016-01-20 DIAGNOSIS — Z5112 Encounter for antineoplastic immunotherapy: Secondary | ICD-10-CM | POA: Diagnosis not present

## 2016-01-20 DIAGNOSIS — E039 Hypothyroidism, unspecified: Secondary | ICD-10-CM

## 2016-01-20 DIAGNOSIS — C3412 Malignant neoplasm of upper lobe, left bronchus or lung: Secondary | ICD-10-CM

## 2016-01-20 DIAGNOSIS — R599 Enlarged lymph nodes, unspecified: Secondary | ICD-10-CM | POA: Diagnosis not present

## 2016-01-20 DIAGNOSIS — J90 Pleural effusion, not elsewhere classified: Secondary | ICD-10-CM

## 2016-01-20 DIAGNOSIS — Z720 Tobacco use: Secondary | ICD-10-CM

## 2016-01-20 DIAGNOSIS — M799 Soft tissue disorder, unspecified: Secondary | ICD-10-CM | POA: Diagnosis not present

## 2016-01-20 LAB — CBC WITH DIFFERENTIAL/PLATELET
BASO%: 0.7 % (ref 0.0–2.0)
Basophils Absolute: 0.1 10*3/uL (ref 0.0–0.1)
EOS%: 0.5 % (ref 0.0–7.0)
Eosinophils Absolute: 0 10*3/uL (ref 0.0–0.5)
HEMATOCRIT: 44.5 % (ref 34.8–46.6)
HEMOGLOBIN: 15.1 g/dL (ref 11.6–15.9)
LYMPH%: 25.4 % (ref 14.0–49.7)
MCH: 34 pg (ref 25.1–34.0)
MCHC: 33.9 g/dL (ref 31.5–36.0)
MCV: 100.3 fL (ref 79.5–101.0)
MONO#: 0.5 10*3/uL (ref 0.1–0.9)
MONO%: 5.9 % (ref 0.0–14.0)
NEUT#: 5.2 10*3/uL (ref 1.5–6.5)
NEUT%: 67.5 % (ref 38.4–76.8)
PLATELETS: 182 10*3/uL (ref 145–400)
RBC: 4.44 10*6/uL (ref 3.70–5.45)
RDW: 13.3 % (ref 11.2–14.5)
WBC: 7.7 10*3/uL (ref 3.9–10.3)
lymph#: 2 10*3/uL (ref 0.9–3.3)

## 2016-01-20 LAB — COMPREHENSIVE METABOLIC PANEL
ALT: 13 U/L (ref 0–55)
AST: 23 U/L (ref 5–34)
Albumin: 3.4 g/dL — ABNORMAL LOW (ref 3.5–5.0)
Alkaline Phosphatase: 74 U/L (ref 40–150)
Anion Gap: 11 mEq/L (ref 3–11)
BUN: 6.8 mg/dL — ABNORMAL LOW (ref 7.0–26.0)
CO2: 24 meq/L (ref 22–29)
Calcium: 9.4 mg/dL (ref 8.4–10.4)
Chloride: 105 mEq/L (ref 98–109)
Creatinine: 0.7 mg/dL (ref 0.6–1.1)
GLUCOSE: 108 mg/dL (ref 70–140)
POTASSIUM: 3.4 meq/L — AB (ref 3.5–5.1)
SODIUM: 139 meq/L (ref 136–145)
Total Bilirubin: 0.57 mg/dL (ref 0.20–1.20)
Total Protein: 7.3 g/dL (ref 6.4–8.3)

## 2016-01-20 MED ORDER — SODIUM CHLORIDE 0.9 % IV SOLN
Freq: Once | INTRAVENOUS | Status: AC
  Administered 2016-01-20: 09:00:00 via INTRAVENOUS

## 2016-01-20 MED ORDER — SODIUM CHLORIDE 0.9 % IV SOLN
200.0000 mg | Freq: Once | INTRAVENOUS | Status: AC
  Administered 2016-01-20: 200 mg via INTRAVENOUS
  Filled 2016-01-20: qty 8

## 2016-01-20 NOTE — Telephone Encounter (Signed)
APPOINTMENTS COMPLETE PER 8/23 LOS. ADDED ADDITIONAL Q3W TX CYCLE FOR 10/25.  PATIENT TO GET NEW SCHEDULE AT NEXT VISIT 9/13. [facility] WILL CALL RE SCAN.

## 2016-01-20 NOTE — Progress Notes (Signed)
[facility] [phone]) [phone]   Fax:([phone]  OFFICE PROGRESS NOTE  Sharon Riches, NP [address] [place] [address]  DIAGNOSIS: Recurrent non-small cell lung cancer, adenocarcinoma with PDL 1 expression 60% diagnosed in November 2016. This was initially diagnosed as Stage IB (T2a, N0, M0) non-small cell lung cancer, adenocarcinoma diagnosed in March 2016.  PRIOR THERAPY: Status post left video-assisted thoracoscopy, wedge resection left lower lobe, thoracoscopic left lower lobectomy and mediastinal lymph node dissection on 08/14/2014.   CURRENT THERAPY: Ketruda 200 mg IV every 3 weeks, first dose expected on 05/12/2015. Status post 12 cycles.  INTERVAL HISTORY: Sharon [name] 55 y.o. female returns to the clinic today for follow-up visit accompanied by her mother. The patient is feeling fine today with no specific complaints. She tolerated the last cycle of her treatment with immunotherapy with [place] fairly well. She is status post 12 cycles. She is somewhat because she changed her job and she walks a lot in her new job. SShe denied having any significant skin rash or diarrhea. She has no chest pain, shortness of breath, cough or hemoptysis. She has no fever or chills. She was supposed to have repeat CT scan of the chest, abdomen and pelvis before this visit but unfortunately Center rescheduling never called her with the appointment.  MEDICAL HISTORY: Past Medical History:  Diagnosis Date  . Acute ST segment elevation MI (Sharon [name])   . Aortic insufficiency    moderate by echo in 2009  . Arthritis   . Cancer (HCC)    LUNGS  . COPD (chronic obstructive pulmonary disease) ([place])   . Coronary artery disease   . Emphysema of lung ([place])   . Hyperlipidemia   . Ischemic cardiomyopathy   . Neoplasm of uncertain behavior of left lower lobe of lung    per CT CHEST/PET 2/4 and 07/18/14 @ [facility]  . Occlusion of right coronary artery ([place])   . Pneumonia    2015    HX BRONCHITIS    . S/P coronary artery stent placement    2009  . Shortness of breath dyspnea    WITH EXEERTION   . Tobacco user     ALLERGIES:  is allergic to codeine; lipitor [atorvastatin calcium]; morphine and related; and oxycodone-acetaminophen.  MEDICATIONS:  Current Outpatient Prescriptions  Medication Sig Dispense Refill  . ALPRAZolam (XANAX) 0.25 MG tablet Take 0.25 mg by mouth daily.  0  . aspirin (ASPIR-LOW) 81 MG EC tablet Take 81 mg by mouth daily. On hold    . clopidogrel (PLAVIX) 75 MG tablet Take 1 tablet (75 mg total) by mouth daily. 90 tablet 3  . levothyroxine (SYNTHROID, LEVOTHROID) 88 MCG tablet TAKE 1 TABLET (88 MCG TOTAL) BY MOUTH DAILY BEFORE BREAKFAST. 30 tablet 1  . ondansetron (ZOFRAN) 8 MG tablet TAKE 1 TABLET (8 MG TOTAL) BY MOUTH EVERY 8 (EIGHT) HOURS AS NEEDED FOR NAUSEA OR VOMITING. 20 tablet 0  . potassium chloride (MICRO-K) 10 MEQ CR capsule Take 10 mEq by mouth 2 (two) times daily.  0  . rosuvastatin (CRESTOR) 20 MG tablet Take 20 mg by mouth daily.  0  . traMADol (ULTRAM) 50 MG tablet Take 1 tablet (50 mg total) by mouth every 6 (six) hours as needed. 60 tablet 0   No current facility-administered medications for this visit.     SURGICAL HISTORY:  Past Surgical History:  Procedure Laterality Date  . CARDIAC CATHETERIZATION  10/15/2007   EF 30-40%  .  CESAREAN SECTION    . CHOLECYSTECTOMY    . CORONARY ANGIOPLASTY WITH STENT PLACEMENT     LAD  . ectopic pregnancies requiring laparotomies    . LEAD REMOVAL Left 08/14/2014   Procedure: CRYO INTERCOSTAL NERVE BLOCK;  Surgeon: Melrose Nakayama, MD;  Location: [facility];  Service: Thoracic;  Laterality: Left;  . LOBECTOMY Left 08/14/2014   Procedure: LEFT LOWER LOBECTOMY;  Surgeon: Melrose Nakayama, MD;  Location: [facility];  Service: Thoracic;  Laterality: Left;  . NODE DISSECTION Left 08/14/2014   Procedure: NODE DISSECTION, LEFT LOWER LOBE LUNG;  Surgeon: Melrose Nakayama, MD;  Location: [facility];  Service: Thoracic;  Laterality: Left;  . SHOULDER SURGERY     LEFT X 2   (REMOVED SOME COLLAR BONE )  . TRANSTHORACIC ECHOCARDIOGRAM  10/17/2007  . TUBAL LIGATION    . US ECHOCARDIOGRAPHY  01/07/2008   EF 55-60%  . VIDEO ASSISTED THORACOSCOPY (VATS)/WEDGE RESECTION Left 08/14/2014   Procedure: VIDEO ASSISTED THORACOSCOPY (VATS)/WEDGE RESECTION;  Surgeon: Melrose Nakayama, MD;  Location: [facility];  Service: Thoracic;  Laterality: Left;    REVIEW OF SYSTEMS:  A comprehensive review of systems was negative except for: Constitutional: positive for fatigue   PHYSICAL EXAMINATION: General appearance: alert, cooperative and no distress Head: Normocephalic, without obvious abnormality, atraumatic Neck: no adenopathy, no JVD, supple, symmetrical, trachea midline and thyroid not enlarged, symmetric, no tenderness/mass/nodules Lymph nodes: Cervical, supraclavicular, and axillary nodes normal. Resp: clear to auscultation bilaterally Back: symmetric, no curvature. ROM normal. No CVA tenderness. Cardio: regular rate and rhythm, S1, S2 normal, no murmur, click, rub or gallop GI: soft, non-tender; bowel sounds normal; no masses,  no organomegaly Extremities: extremities normal, atraumatic, no cyanosis or edema Neurologic: Alert and oriented X 3, normal strength and tone. Normal symmetric reflexes. Normal coordination and gait  ECOG PERFORMANCE STATUS: 1 - Symptomatic but completely ambulatory  Blood pressure (!) 128/57, pulse 73, temperature 98.5 F (36.9 C), temperature source Oral, resp. rate 18, height '5\' 4"'$  (1.626 m), weight 139 lb 1.6 oz (63.1 kg), SpO2 96 %.  LABORATORY DATA: Lab Results  Component Value Date   WBC 7.7 01/20/2016   HGB 15.1 01/20/2016   HCT 44.5 01/20/2016   MCV 100.3 01/20/2016   PLT 182 01/20/2016      Chemistry      Component Value Date/Time   NA 139 01/20/2016 0738   K 3.4 (L) 01/20/2016 0738   CL 109 08/16/2014 0520   CO2 24 01/20/2016 0738   BUN 6.8 (L)  01/20/2016 0738   CREATININE 0.7 01/20/2016 0738      Component Value Date/Time   CALCIUM 9.4 01/20/2016 0738   ALKPHOS 74 01/20/2016 0738   AST 23 01/20/2016 0738   ALT 13 01/20/2016 0738   BILITOT 0.57 01/20/2016 0738       RADIOGRAPHIC STUDIES: No results found.  ASSESSMENT AND PLAN: This is a very pleasant 55 years old white female with history of recurrent/metastatic non-small cell lung cancer , adenocarcinoma in November 2016 initially diagnosed as stage IB non-small cell lung cancer status post left lower lobectomy with mediastinal lymph node dissection with evidence of pleural invasion and has been on observation. Unfortunately the patient has evidence for disease recurrence and the recent CT scan of the chest showed new small left pleural effusion as well as new pleural based nodules worrisome for transpleural spread of tumor concerning for disease recurrence. The PET scan performed recently shows also numerous new small hypermetabolic  soft tissue nodules throughout the left pleural space and small tiny left pleural effusion as well as hypermetabolic mediastinal lymphadenopathy. The patient is currently on treatment with immunotherapy with Ketruda (pembrolizumab) status post 12 cycles. I recommended for the patient to continue with her treatment with immunotherapy and she will proceed with cycle #13 today. 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  For smoke cessation, I strongly encouraged the patient to quit smoking and offered her to smoke cessation program. I will arrange for the patient to have the CT scan of the chest, abdomen and pelvis rescheduled before her upcoming visit in 3 weeks. The patient was advised to call immediately if she has any concerning symptoms in the interval. The patient voices understanding of current disease status and treatment options and is in agreement with the current care plan.  All questions were  answered. The patient knows to call the clinic with any problems, questions or concerns. We can certainly see the patient much sooner if necessary.  Disclaimer: This note was dictated with voice recognition software. Similar sounding words can inadvertently be transcribed and may not be corrected upon review.

## 2016-01-20 NOTE — Patient Instructions (Signed)
Pembrolizumab injection What is this medicine? PEMBROLIZUMAB (pem broe liz ue mab) is a monoclonal antibody. It is used to treat melanoma and non-small cell lung cancer. This medicine may be used for other purposes; ask your health care provider or pharmacist if you have questions. What should I tell my health care provider before I take this medicine? They need to know if you have any of these conditions: -diabetes -immune system problems -inflammatory bowel disease -liver disease -lung or breathing disease -lupus -an unusual or allergic reaction to pembrolizumab, other medicines, foods, dyes, or preservatives -pregnant or trying to get pregnant -breast-feeding How should I use this medicine? This medicine is for infusion into a vein. It is given by a health care professional in a hospital or clinic setting. A special MedGuide will be given to you before each treatment. Be sure to read this information carefully each time. Talk to your pediatrician regarding the use of this medicine in children. Special care may be needed. Overdosage: If you think you have taken too much of this medicine contact a poison control center or emergency room at once. NOTE: This medicine is only for you. Do not share this medicine with others. What if I miss a dose? It is important not to miss your dose. Call your doctor or health care professional if you are unable to keep an appointment. What may interact with this medicine? Interactions have not been studied. Give your health care provider a list of all the medicines, herbs, non-prescription drugs, or dietary supplements you use. Also tell them if you smoke, drink alcohol, or use illegal drugs. Some items may interact with your medicine. This list may not describe all possible interactions. Give your health care provider a list of all the medicines, herbs, non-prescription drugs, or dietary supplements you use. Also tell them if you smoke, drink alcohol, or  use illegal drugs. Some items may interact with your medicine. What should I watch for while using this medicine? Your condition will be monitored carefully while you are receiving this medicine. You may need blood work done while you are taking this medicine. Do not become pregnant while taking this medicine or for 4 months after stopping it. Women should inform their doctor if they wish to become pregnant or think they might be pregnant. There is a potential for serious side effects to an unborn child. Talk to your health care professional or pharmacist for more information. Do not breast-feed an infant while taking this medicine or for 4 months after the last dose. What side effects may I notice from receiving this medicine? Side effects that you should report to your doctor or health care professional as soon as possible: -allergic reactions like skin rash, itching or hives, swelling of the face, lips, or tongue -bloody or black, tarry stools -breathing problems -change in the amount of urine -changes in vision -chest pain -chills -dark urine -dizziness or feeling faint or lightheaded -fast or irregular heartbeat -fever -flushing -hair loss -muscle pain -muscle weakness -persistent headache -signs and symptoms of high blood sugar such as dizziness; dry mouth; dry skin; fruity breath; nausea; stomach pain; increased hunger or thirst; increased urination -signs and symptoms of liver injury like dark urine, light-colored stools, loss of appetite, nausea, right upper belly pain, yellowing of the eyes or skin -stomach pain -weight loss Side effects that usually do not require medical attention (Report these to your doctor or health care professional if they continue or are bothersome.):constipation -cough -diarrhea -joint pain -  tiredness This list may not describe all possible side effects. Call your doctor for medical advice about side effects. You may report side effects to FDA at  1-800-FDA-1088. Where should I keep my medicine? This drug is given in a hospital or clinic and will not be stored at home. NOTE: This sheet is a summary. It may not cover all possible information. If you have questions about this medicine, talk to your doctor, pharmacist, or health care provider.    2016, Elsevier/Gold Standard. (2014-07-15 17:24:19)

## 2016-01-21 ENCOUNTER — Other Ambulatory Visit: Payer: BLUE CROSS/BLUE SHIELD

## 2016-01-21 ENCOUNTER — Ambulatory Visit: Payer: BLUE CROSS/BLUE SHIELD | Admitting: Internal Medicine

## 2016-01-21 ENCOUNTER — Ambulatory Visit: Payer: BLUE CROSS/BLUE SHIELD

## 2016-02-04 ENCOUNTER — Ambulatory Visit (HOSPITAL_COMMUNITY)
Admission: RE | Admit: 2016-02-04 | Discharge: 2016-02-04 | Disposition: A | Discharge status: Home / Self Care | Payer: BLUE CROSS/BLUE SHIELD | Source: Ambulatory Visit | Attending: Internal Medicine | Admitting: Internal Medicine

## 2016-02-04 DIAGNOSIS — I2510 Atherosclerotic heart disease of native coronary artery without angina pectoris: Secondary | ICD-10-CM | POA: Diagnosis not present

## 2016-02-04 DIAGNOSIS — E039 Hypothyroidism, unspecified: Secondary | ICD-10-CM

## 2016-02-04 DIAGNOSIS — R590 Localized enlarged lymph nodes: Secondary | ICD-10-CM | POA: Diagnosis not present

## 2016-02-04 DIAGNOSIS — J439 Emphysema, unspecified: Secondary | ICD-10-CM | POA: Diagnosis not present

## 2016-02-04 DIAGNOSIS — C3412 Malignant neoplasm of upper lobe, left bronchus or lung: Secondary | ICD-10-CM | POA: Diagnosis not present

## 2016-02-04 DIAGNOSIS — Z5112 Encounter for antineoplastic immunotherapy: Secondary | ICD-10-CM

## 2016-02-04 DIAGNOSIS — R918 Other nonspecific abnormal finding of lung field: Secondary | ICD-10-CM | POA: Insufficient documentation

## 2016-02-04 DIAGNOSIS — I700 Atherosclerosis of aorta: Secondary | ICD-10-CM | POA: Diagnosis not present

## 2016-02-04 MED ORDER — IOPAMIDOL (ISOVUE-300) INJECTION 61%: 30.0000 mL | Freq: Once | INTRAVENOUS | Status: DC | PRN

## 2016-02-04 MED ORDER — IOPAMIDOL (ISOVUE-300) INJECTION 61%
100.0000 mL | Freq: Once | INTRAVENOUS | Status: AC | PRN
  Administered 2016-02-04: 100 mL via INTRAVENOUS

## 2016-02-04 MED ORDER — IOPAMIDOL (ISOVUE-300) INJECTION 61%
30.0000 mL | Freq: Once | INTRAVENOUS | Status: AC | PRN
  Administered 2016-02-04: 30 mL via ORAL

## 2016-02-09 ENCOUNTER — Encounter: Payer: Self-pay | Admitting: Internal Medicine

## 2016-02-09 ENCOUNTER — Other Ambulatory Visit (HOSPITAL_BASED_OUTPATIENT_CLINIC_OR_DEPARTMENT_OTHER): Payer: BLUE CROSS/BLUE SHIELD

## 2016-02-09 ENCOUNTER — Ambulatory Visit (HOSPITAL_BASED_OUTPATIENT_CLINIC_OR_DEPARTMENT_OTHER): Payer: BLUE CROSS/BLUE SHIELD

## 2016-02-09 ENCOUNTER — Ambulatory Visit (HOSPITAL_BASED_OUTPATIENT_CLINIC_OR_DEPARTMENT_OTHER): Payer: BLUE CROSS/BLUE SHIELD | Admitting: Internal Medicine

## 2016-02-09 VITALS — BP 123/55 | HR 70 | Temp 97.6°F | Resp 18 | Ht 64.0 in | Wt 140.3 lb

## 2016-02-09 DIAGNOSIS — C3412 Malignant neoplasm of upper lobe, left bronchus or lung: Secondary | ICD-10-CM | POA: Diagnosis not present

## 2016-02-09 DIAGNOSIS — Z5112 Encounter for antineoplastic immunotherapy: Secondary | ICD-10-CM

## 2016-02-09 DIAGNOSIS — E039 Hypothyroidism, unspecified: Secondary | ICD-10-CM

## 2016-02-09 DIAGNOSIS — Z79899 Other long term (current) drug therapy: Secondary | ICD-10-CM

## 2016-02-09 DIAGNOSIS — E032 Hypothyroidism due to medicaments and other exogenous substances: Secondary | ICD-10-CM | POA: Diagnosis not present

## 2016-02-09 DIAGNOSIS — Z720 Tobacco use: Secondary | ICD-10-CM

## 2016-02-09 LAB — COMPREHENSIVE METABOLIC PANEL
ALBUMIN: 3.4 g/dL — AB (ref 3.5–5.0)
ALT: 13 U/L (ref 0–55)
AST: 19 U/L (ref 5–34)
Alkaline Phosphatase: 76 U/L (ref 40–150)
Anion Gap: 9 mEq/L (ref 3–11)
BILIRUBIN TOTAL: 0.37 mg/dL (ref 0.20–1.20)
BUN: 5.8 mg/dL — AB (ref 7.0–26.0)
CALCIUM: 9.3 mg/dL (ref 8.4–10.4)
CHLORIDE: 112 meq/L — AB (ref 98–109)
CO2: 20 mEq/L — ABNORMAL LOW (ref 22–29)
CREATININE: 0.7 mg/dL (ref 0.6–1.1)
EGFR: >90 mL/min/{1.73_m2} (ref >90)
Glucose: 103 mg/dl (ref 70–140)
Potassium: 3.6 mEq/L (ref 3.5–5.1)
Sodium: 141 mEq/L (ref 136–145)
TOTAL PROTEIN: 7.1 g/dL (ref 6.4–8.3)

## 2016-02-09 LAB — CBC WITH DIFFERENTIAL/PLATELET
BASO%: 1 % (ref 0.0–2.0)
Basophils Absolute: 0.1 10*3/uL (ref 0.0–0.1)
EOS%: 1.2 % (ref 0.0–7.0)
Eosinophils Absolute: 0.1 10*3/uL (ref 0.0–0.5)
HEMATOCRIT: 45.2 % (ref 34.8–46.6)
HEMOGLOBIN: 15.3 g/dL (ref 11.6–15.9)
LYMPH#: 1.8 10*3/uL (ref 0.9–3.3)
LYMPH%: 21 % (ref 14.0–49.7)
MCH: 33.3 pg (ref 25.1–34.0)
MCHC: 33.8 g/dL (ref 31.5–36.0)
MCV: 98.5 fL (ref 79.5–101.0)
MONO#: 0.5 10*3/uL (ref 0.1–0.9)
MONO%: 6.4 % (ref 0.0–14.0)
NEUT%: 70.4 % (ref 38.4–76.8)
NEUTROS ABS: 5.9 10*3/uL (ref 1.5–6.5)
PLATELETS: 200 10*3/uL (ref 145–400)
RBC: 4.59 10*6/uL (ref 3.70–5.45)
RDW: 13.8 % (ref 11.2–14.5)
WBC: 8.3 10*3/uL (ref 3.9–10.3)

## 2016-02-09 LAB — TSH: TSH: 1.378 m(IU)/L (ref 0.308–3.960)

## 2016-02-09 MED ORDER — SODIUM CHLORIDE 0.9 % IV SOLN
Freq: Once | INTRAVENOUS | Status: AC
  Administered 2016-02-09: 11:00:00 via INTRAVENOUS

## 2016-02-09 MED ORDER — SODIUM CHLORIDE 0.9 % IV SOLN
200.0000 mg | Freq: Once | INTRAVENOUS | Status: AC
  Administered 2016-02-09: 200 mg via INTRAVENOUS
  Filled 2016-02-09: qty 8

## 2016-02-09 NOTE — Progress Notes (Signed)
[place] [phone]   [phone]  OFFICE PROGRESS NOTE  [name], NP [address] [place] [address]  DIAGNOSIS: Recurrent non-small cell lung cancer, adenocarcinoma with PDL 1 expression 60% diagnosed in November 2016. This was initially diagnosed as Stage IB (T2a, N0, M0) non-small cell lung cancer, adenocarcinoma diagnosed in March 2016.  PRIOR THERAPY: Status post left video-assisted thoracoscopy, wedge resection left lower lobe, thoracoscopic left lower lobectomy and mediastinal lymph node dissection on 08/14/2014.   CURRENT THERAPY: Ketruda 200 mg IV every 3 weeks, first dose expected on 05/12/2015. Status post 13 cycles.  INTERVAL HISTORY: Sharon [name] 55 y.o. female returns to the clinic today for follow-up visit accompanied by her mother and daughter. The patient is feeling fine today with no specific complaints except for mild fatigue. She tolerated the last cycle of her treatment with immunotherapy with [place] fairly well. She denied having any significant skin rash or diarrhea. She has no chest pain, shortness of breath, cough or hemoptysis. She has no fever or chills. She has repeat CT scan of the chest, abdomen and pelvis performed recently and she is here for evaluation and discussion of her scan results before proceeding with cycle #14.  MEDICAL HISTORY: Past Medical History:  Diagnosis Date  . Acute ST segment elevation MI ([facility])   . Aortic insufficiency    moderate by echo in 2009  . Arthritis   . Cancer (HCC)    LUNGS  . COPD (chronic obstructive pulmonary disease) ([place])   . Coronary artery disease   . Emphysema of lung ([facility])   . Hyperlipidemia   . Ischemic cardiomyopathy   . Neoplasm of uncertain behavior of left lower lobe of lung    per CT CHEST/PET 2/4 and 07/18/14 @ [facility]  . Occlusion of right coronary artery ([facility])   . Pneumonia    2015   HX BRONCHITIS    . S/P coronary artery stent placement    2009   . Shortness of breath dyspnea    WITH EXEERTION   . Tobacco user     ALLERGIES:  is allergic to codeine; lipitor [atorvastatin calcium]; morphine and related; and oxycodone-acetaminophen.  MEDICATIONS:  Current Outpatient Prescriptions  Medication Sig Dispense Refill  . ALPRAZolam (XANAX) 0.25 MG tablet Take 0.25 mg by mouth daily.  0  . aspirin (ASPIR-LOW) 81 MG EC tablet Take 81 mg by mouth daily. On hold    . clopidogrel (PLAVIX) 75 MG tablet Take 1 tablet (75 mg total) by mouth daily. 90 tablet 3  . levothyroxine (SYNTHROID, LEVOTHROID) 88 MCG tablet TAKE 1 TABLET (88 MCG TOTAL) BY MOUTH DAILY BEFORE BREAKFAST. 30 tablet 1  . ondansetron (ZOFRAN) 8 MG tablet TAKE 1 TABLET (8 MG TOTAL) BY MOUTH EVERY 8 (EIGHT) HOURS AS NEEDED FOR NAUSEA OR VOMITING. 20 tablet 0  . potassium chloride (MICRO-K) 10 MEQ CR capsule Take 10 mEq by mouth 2 (two) times daily.  0  . rosuvastatin (CRESTOR) 20 MG tablet Take 20 mg by mouth daily.  0  . traMADol (ULTRAM) 50 MG tablet Take 1 tablet (50 mg total) by mouth every 6 (six) hours as needed. 60 tablet 0   No current facility-administered medications for this visit.     SURGICAL HISTORY:  Past Surgical History:  Procedure Laterality Date  . CARDIAC CATHETERIZATION  10/15/2007   EF 30-40%  . CESAREAN SECTION    . CHOLECYSTECTOMY    . CORONARY ANGIOPLASTY  WITH STENT PLACEMENT     LAD  . ectopic pregnancies requiring laparotomies    . LEAD REMOVAL Left 08/14/2014   Procedure: CRYO INTERCOSTAL NERVE BLOCK;  Surgeon: Melrose Nakayama, MD;  Location: [facility];  Service: Thoracic;  Laterality: Left;  . LOBECTOMY Left 08/14/2014   Procedure: LEFT LOWER LOBECTOMY;  Surgeon: Melrose Nakayama, MD;  Location: [facility];  Service: Thoracic;  Laterality: Left;  . NODE DISSECTION Left 08/14/2014   Procedure: NODE DISSECTION, LEFT LOWER LOBE LUNG;  Surgeon: Melrose Nakayama, MD;  Location: [facility];  Service: Thoracic;  Laterality: Left;  . SHOULDER SURGERY      LEFT X 2   (REMOVED SOME COLLAR BONE )  . TRANSTHORACIC ECHOCARDIOGRAM  10/17/2007  . TUBAL LIGATION    . US ECHOCARDIOGRAPHY  01/07/2008   EF 55-60%  . VIDEO ASSISTED THORACOSCOPY (VATS)/WEDGE RESECTION Left 08/14/2014   Procedure: VIDEO ASSISTED THORACOSCOPY (VATS)/WEDGE RESECTION;  Surgeon: Melrose Nakayama, MD;  Location: [facility];  Service: Thoracic;  Laterality: Left;    REVIEW OF SYSTEMS:  Constitutional: positive for fatigue Eyes: negative Ears, nose, mouth, throat, and face: negative Respiratory: negative Cardiovascular: negative Gastrointestinal: negative Genitourinary:negative Integument/breast: negative Hematologic/lymphatic: negative Musculoskeletal:negative Neurological: negative Behavioral/Psych: negative Endocrine: negative Allergic/Immunologic: negative   PHYSICAL EXAMINATION: General appearance: alert, cooperative and no distress Head: Normocephalic, without obvious abnormality, atraumatic Neck: no adenopathy, no JVD, supple, symmetrical, trachea midline and thyroid not enlarged, symmetric, no tenderness/mass/nodules Lymph nodes: Cervical, supraclavicular, and axillary nodes normal. Resp: clear to auscultation bilaterally Back: symmetric, no curvature. ROM normal. No CVA tenderness. Cardio: regular rate and rhythm, S1, S2 normal, no murmur, click, rub or gallop GI: soft, non-tender; bowel sounds normal; no masses,  no organomegaly Extremities: extremities normal, atraumatic, no cyanosis or edema Neurologic: Alert and oriented X 3, normal strength and tone. Normal symmetric reflexes. Normal coordination and gait  ECOG PERFORMANCE STATUS: 1 - Symptomatic but completely ambulatory  Blood pressure (!) 123/55, pulse 70, temperature 97.6 F (36.4 C), temperature source Oral, resp. rate 18, height '5\' 4"'$  (1.626 m), weight 140 lb 4.8 oz (63.6 kg), SpO2 99 %.  LABORATORY DATA: Lab Results  Component Value Date   WBC 8.3 02/09/2016   HGB 15.3 02/09/2016   HCT 45.2  02/09/2016   MCV 98.5 02/09/2016   PLT 200 02/09/2016      Chemistry      Component Value Date/Time   NA 139 01/20/2016 0738   K 3.4 (L) 01/20/2016 0738   CL 109 08/16/2014 0520   CO2 24 01/20/2016 0738   BUN 6.8 (L) 01/20/2016 0738   CREATININE 0.7 01/20/2016 0738      Component Value Date/Time   CALCIUM 9.4 01/20/2016 0738   ALKPHOS 74 01/20/2016 0738   AST 23 01/20/2016 0738   ALT 13 01/20/2016 0738   BILITOT 0.57 01/20/2016 0738       RADIOGRAPHIC STUDIES: Ct Chest W Contrast  Result Date: 02/04/2016 CLINICAL DATA:  Left upper lobe primary malignant neoplasm. Encounter for anti neoplastic immunotherapy. Restaging. EXAM: CT CHEST, ABDOMEN, AND PELVIS WITH CONTRAST TECHNIQUE: Multidetector CT imaging of the chest, abdomen and pelvis was performed following the standard protocol during bolus administration of intravenous contrast. CONTRAST:  151m ISOVUE-300 IOPAMIDOL (ISOVUE-300) INJECTION 61% COMPARISON:  11/17/2015 FINDINGS: CT CHEST FINDINGS Cardiovascular: Aortic and branch vessel atherosclerosis. Mild cardiomegaly. No pericardial effusion. Dense lad coronary artery atherosclerosis. Prior left apical infarct, as evidenced by a subendocardial hypoattenuation on image 39/series 2. No central pulmonary embolism,  on this non-dedicated study. Mediastinum/Nodes: No supraclavicular adenopathy. Right paratracheal node measures 13 mm on image 16/ series 2 and is not significantly changed. No hilar adenopathy. A prevascular node measures 1.0 x 2.5 cm on image 24/ series 2. Compare 1.0 x 2.2 cm on the prior exam (when remeasured). Lungs/Pleura: No pleural fluid. Moderate centrilobular and mild paraseptal emphysema. Vague 2 mm left upper lobe pulmonary nodule on image 31/series 5 is likely similar on image 22/series 5 of the prior. Left upper lobe surgical sutures. Left lower lobe scarring. 3 mm right upper lobe pulmonary nodule is unchanged on image 45/series 4. Musculoskeletal: Probable  Schmorl's node deformity at superior endplate of J62 with degenerative sclerosis at T10-11. Similar. CT ABDOMEN PELVIS FINDINGS Hepatobiliary: Normal liver. Cholecystectomy, without biliary ductal dilatation. Pancreas: Normal, without mass or ductal dilatation. Spleen: Normal in size, without focal abnormality. Adrenals/Urinary Tract: Normal adrenal glands. Normal kidneys, without hydronephrosis. Again identified is air within the nondependent urinary bladder. Stomach/Bowel: Proximal gastric underdistention. Normal colon, appendix, and terminal ileum. Normal small bowel. Vascular/Lymphatic: Aortic and branch vessel atherosclerosis. No abdominopelvic adenopathy. Reproductive: Retroverted uterus.  No adnexal mass. Other: No significant free fluid. No evidence of omental or peritoneal disease. Musculoskeletal: Right femoral head sclerosis measures 7 mm on image 111/series 2, has been present back to 07/22/2014 and is favored to represent a bone island. Mild bilateral hip osteoarthritis. Degenerative disc disease at the lumbosacral junction. IMPRESSION: 1. Suspect minimal enlargement of a prevascular node. Right paratracheal adenopathy is similar. 2. Similar small bilateral pulmonary nodules superimposed upon centrilobular and paraseptal emphysema. 3. No new sites of disease identified. 4. Age advanced coronary artery atherosclerosis. Recommend assessment of coronary risk factors and consideration of medical therapy. 5.  Aortic atherosclerosis. 6. Air within the urinary bladder could be iatrogenic. Correlate with instrumentation. Electronically Signed   By: Abigail Miyamoto M.D.   On: 02/04/2016 11:23   Ct Abdomen Pelvis W Contrast  Result Date: 02/04/2016 CLINICAL DATA:  Left upper lobe primary malignant neoplasm. Encounter for anti neoplastic immunotherapy. Restaging. EXAM: CT CHEST, ABDOMEN, AND PELVIS WITH CONTRAST TECHNIQUE: Multidetector CT imaging of the chest, abdomen and pelvis was performed following the  standard protocol during bolus administration of intravenous contrast. CONTRAST:  129m ISOVUE-300 IOPAMIDOL (ISOVUE-300) INJECTION 61% COMPARISON:  11/17/2015 FINDINGS: CT CHEST FINDINGS Cardiovascular: Aortic and branch vessel atherosclerosis. Mild cardiomegaly. No pericardial effusion. Dense lad coronary artery atherosclerosis. Prior left apical infarct, as evidenced by a subendocardial hypoattenuation on image 39/series 2. No central pulmonary embolism, on this non-dedicated study. Mediastinum/Nodes: No supraclavicular adenopathy. Right paratracheal node measures 13 mm on image 16/ series 2 and is not significantly changed. No hilar adenopathy. A prevascular node measures 1.0 x 2.5 cm on image 24/ series 2. Compare 1.0 x 2.2 cm on the prior exam (when remeasured). Lungs/Pleura: No pleural fluid. Moderate centrilobular and mild paraseptal emphysema. Vague 2 mm left upper lobe pulmonary nodule on image 31/series 5 is likely similar on image 22/series 5 of the prior. Left upper lobe surgical sutures. Left lower lobe scarring. 3 mm right upper lobe pulmonary nodule is unchanged on image 45/series 4. Musculoskeletal: Probable Schmorl's node deformity at superior endplate of TG31with degenerative sclerosis at T10-11. Similar. CT ABDOMEN PELVIS FINDINGS Hepatobiliary: Normal liver. Cholecystectomy, without biliary ductal dilatation. Pancreas: Normal, without mass or ductal dilatation. Spleen: Normal in size, without focal abnormality. Adrenals/Urinary Tract: Normal adrenal glands. Normal kidneys, without hydronephrosis. Again identified is air within the nondependent urinary bladder. Stomach/Bowel: Proximal gastric underdistention. Normal  colon, appendix, and terminal ileum. Normal small bowel. Vascular/Lymphatic: Aortic and branch vessel atherosclerosis. No abdominopelvic adenopathy. Reproductive: Retroverted uterus.  No adnexal mass. Other: No significant free fluid. No evidence of omental or peritoneal disease.  Musculoskeletal: Right femoral head sclerosis measures 7 mm on image 111/series 2, has been present back to 07/22/2014 and is favored to represent a bone island. Mild bilateral hip osteoarthritis. Degenerative disc disease at the lumbosacral junction. IMPRESSION: 1. Suspect minimal enlargement of a prevascular node. Right paratracheal adenopathy is similar. 2. Similar small bilateral pulmonary nodules superimposed upon centrilobular and paraseptal emphysema. 3. No new sites of disease identified. 4. Age advanced coronary artery atherosclerosis. Recommend assessment of coronary risk factors and consideration of medical therapy. 5.  Aortic atherosclerosis. 6. Air within the urinary bladder could be iatrogenic. Correlate with instrumentation. Electronically Signed   By: Abigail Miyamoto M.D.   On: 02/04/2016 11:23    ASSESSMENT AND PLAN: This is a very pleasant 55 years old white female with history of recurrent/metastatic non-small cell lung cancer , adenocarcinoma in November 2016 initially diagnosed as stage IB non-small cell lung cancer status post left lower lobectomy with mediastinal lymph node dissection with evidence of pleural invasion and has been on observation. Unfortunately the patient has evidence for disease recurrence and the recent CT scan of the chest showed new small left pleural effusion as well as new pleural based nodules worrisome for transpleural spread of tumor concerning for disease recurrence. The PET scan performed recently shows also numerous new small hypermetabolic soft tissue nodules throughout the left pleural space and small tiny left pleural effusion as well as hypermetabolic mediastinal lymphadenopathy. The patient is currently on treatment with immunotherapy with Ketruda (pembrolizumab) status post 13 cycles. The recent CT scan of the chest, abdomen and pelvis showed no evidence for disease progression except for minimal enlargement of a prevascular lymph node. I discussed the  scan results with the patient and her family. I recommended for her to proceed with cycle #14 today as a scheduled. 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  For smoke cessation, I strongly encouraged the patient to quit smoking and offered her to smoke cessation program. I will see the patient back for follow-up visit in 3 weeks for evaluation before starting cycle #15. The patient was advised to call immediately if she has any concerning symptoms in the interval. The patient voices understanding of current disease status and treatment options and is in agreement with the current care plan.  All questions were answered. The patient knows to call the clinic with any problems, questions or concerns. We can certainly see the patient much sooner if necessary.  Disclaimer: This note was dictated with voice recognition software. Similar sounding words can inadvertently be transcribed and may not be corrected upon review.

## 2016-03-01 ENCOUNTER — Ambulatory Visit (HOSPITAL_BASED_OUTPATIENT_CLINIC_OR_DEPARTMENT_OTHER): Payer: BLUE CROSS/BLUE SHIELD

## 2016-03-01 ENCOUNTER — Telehealth: Payer: Self-pay | Admitting: Internal Medicine

## 2016-03-01 ENCOUNTER — Encounter: Payer: Self-pay | Admitting: Internal Medicine

## 2016-03-01 ENCOUNTER — Other Ambulatory Visit (HOSPITAL_BASED_OUTPATIENT_CLINIC_OR_DEPARTMENT_OTHER): Payer: BLUE CROSS/BLUE SHIELD

## 2016-03-01 ENCOUNTER — Telehealth: Payer: Self-pay | Admitting: Medical Oncology

## 2016-03-01 ENCOUNTER — Other Ambulatory Visit: Payer: Self-pay | Admitting: Internal Medicine

## 2016-03-01 ENCOUNTER — Ambulatory Visit (HOSPITAL_BASED_OUTPATIENT_CLINIC_OR_DEPARTMENT_OTHER): Payer: BLUE CROSS/BLUE SHIELD | Admitting: Internal Medicine

## 2016-03-01 VITALS — BP 128/67 | HR 74 | Temp 98.6°F | Resp 16 | Ht 64.0 in | Wt 139.7 lb

## 2016-03-01 DIAGNOSIS — R599 Enlarged lymph nodes, unspecified: Secondary | ICD-10-CM | POA: Diagnosis not present

## 2016-03-01 DIAGNOSIS — C3412 Malignant neoplasm of upper lobe, left bronchus or lung: Secondary | ICD-10-CM

## 2016-03-01 DIAGNOSIS — J90 Pleural effusion, not elsewhere classified: Secondary | ICD-10-CM

## 2016-03-01 DIAGNOSIS — E039 Hypothyroidism, unspecified: Secondary | ICD-10-CM

## 2016-03-01 DIAGNOSIS — M799 Soft tissue disorder, unspecified: Secondary | ICD-10-CM | POA: Diagnosis not present

## 2016-03-01 DIAGNOSIS — R911 Solitary pulmonary nodule: Secondary | ICD-10-CM

## 2016-03-01 DIAGNOSIS — Z5112 Encounter for antineoplastic immunotherapy: Secondary | ICD-10-CM

## 2016-03-01 DIAGNOSIS — E032 Hypothyroidism due to medicaments and other exogenous substances: Secondary | ICD-10-CM

## 2016-03-01 DIAGNOSIS — Z720 Tobacco use: Secondary | ICD-10-CM

## 2016-03-01 LAB — CBC WITH DIFFERENTIAL/PLATELET
BASO%: 0.6 % (ref 0.0–2.0)
Basophils Absolute: 0.1 10*3/uL (ref 0.0–0.1)
EOS%: 1.4 % (ref 0.0–7.0)
Eosinophils Absolute: 0.2 10*3/uL (ref 0.0–0.5)
HEMATOCRIT: 45.3 % (ref 34.8–46.6)
HGB: 15 g/dL (ref 11.6–15.9)
LYMPH#: 2.2 10*3/uL (ref 0.9–3.3)
LYMPH%: 19.1 % (ref 14.0–49.7)
MCH: 32.6 pg (ref 25.1–34.0)
MCHC: 33 g/dL (ref 31.5–36.0)
MCV: 98.6 fL (ref 79.5–101.0)
MONO#: 0.5 10*3/uL (ref 0.1–0.9)
MONO%: 4.4 % (ref 0.0–14.0)
NEUT#: 8.4 10*3/uL — ABNORMAL HIGH (ref 1.5–6.5)
NEUT%: 74.5 % (ref 38.4–76.8)
PLATELETS: 201 10*3/uL (ref 145–400)
RBC: 4.59 10*6/uL (ref 3.70–5.45)
RDW: 13.7 % (ref 11.2–14.5)
WBC: 11.3 10*3/uL — ABNORMAL HIGH (ref 3.9–10.3)

## 2016-03-01 LAB — COMPREHENSIVE METABOLIC PANEL
ALBUMIN: 3.4 g/dL — AB (ref 3.5–5.0)
ALK PHOS: 85 U/L (ref 40–150)
ALT: 15 U/L (ref 0–55)
ANION GAP: 11 meq/L (ref 3–11)
AST: 25 U/L (ref 5–34)
BILIRUBIN TOTAL: 0.32 mg/dL (ref 0.20–1.20)
BUN: 9.9 mg/dL (ref 7.0–26.0)
CALCIUM: 9.5 mg/dL (ref 8.4–10.4)
CHLORIDE: 108 meq/L (ref 98–109)
CO2: 22 mEq/L (ref 22–29)
CREATININE: 0.8 mg/dL (ref 0.6–1.1)
EGFR: 88 mL/min/{1.73_m2} — ABNORMAL LOW (ref >90)
Glucose: 102 mg/dl (ref 70–140)
Potassium: 3.6 mEq/L (ref 3.5–5.1)
Sodium: 141 mEq/L (ref 136–145)
Total Protein: 7.2 g/dL (ref 6.4–8.3)

## 2016-03-01 LAB — TSH: TSH: 9.78 m[IU]/L — AB (ref 0.308–3.960)

## 2016-03-01 MED ORDER — SODIUM CHLORIDE 0.9 % IV SOLN
Freq: Once | INTRAVENOUS | Status: AC
  Administered 2016-03-01: 12:00:00 via INTRAVENOUS

## 2016-03-01 MED ORDER — SODIUM CHLORIDE 0.9 % IV SOLN
200.0000 mg | Freq: Once | INTRAVENOUS | Status: AC
  Administered 2016-03-01: 200 mg via INTRAVENOUS
  Filled 2016-03-01: qty 8

## 2016-03-01 NOTE — Telephone Encounter (Signed)
Gave patient avs report and appointments for October thru December

## 2016-03-01 NOTE — Patient Instructions (Signed)
Smoking Cessation, Tips for Success If you are ready to quit smoking, congratulations! You have chosen to help yourself be healthier. Cigarettes bring nicotine, tar, carbon monoxide, and other irritants into your body. Your lungs, heart, and blood vessels will be able to work better without these poisons. There are many different ways to quit smoking. Nicotine gum, nicotine patches, a nicotine inhaler, or nicotine nasal spray can help with physical craving. Hypnosis, support groups, and medicines help break the habit of smoking. WHAT THINGS CAN I DO TO MAKE QUITTING EASIER?  Here are some tips to help you quit for good:  Pick a date when you will quit smoking completely. Tell all of your friends and family about your plan to quit on that date.  Do not try to slowly cut down on the number of cigarettes you are smoking. Pick a quit date and quit smoking completely starting on that day.  Throw away all cigarettes.   Clean and remove all ashtrays from your home, work, and car.  On a card, write down your reasons for quitting. Carry the card with you and read it when you get the urge to smoke.  Cleanse your body of nicotine. Drink enough water and fluids to keep your urine clear or pale yellow. Do this after quitting to flush the nicotine from your body.  Learn to predict your moods. Do not let a bad situation be your excuse to have a cigarette. Some situations in your life might tempt you into wanting a cigarette.  Never have "just one" cigarette. It leads to wanting another and another. Remind yourself of your decision to quit.  Change habits associated with smoking. If you smoked while driving or when feeling stressed, try other activities to replace smoking. Stand up when drinking your coffee. Brush your teeth after eating. Sit in a different chair when you read the paper. Avoid alcohol while trying to quit, and try to drink fewer caffeinated beverages. Alcohol and caffeine may urge you to  smoke.  Avoid foods and drinks that can trigger a desire to smoke, such as sugary or spicy foods and alcohol.  Ask people who smoke not to smoke around you.  Have something planned to do right after eating or having a cup of coffee. For example, plan to take a walk or exercise.  Try a relaxation exercise to calm you down and decrease your stress. Remember, you may be tense and nervous for the first 2 weeks after you quit, but this will pass.  Find new activities to keep your hands busy. Play with a pen, coin, or rubber band. Doodle or draw things on paper.  Brush your teeth right after eating. This will help cut down on the craving for the taste of tobacco after meals. You can also try mouthwash.   Use oral substitutes in place of cigarettes. Try using lemon drops, carrots, cinnamon sticks, or chewing gum. Keep them handy so they are available when you have the urge to smoke.  When you have the urge to smoke, try deep breathing.  Designate your home as a nonsmoking area.  If you are a heavy smoker, ask your health care provider about a prescription for nicotine chewing gum. It can ease your withdrawal from nicotine.  Reward yourself. Set aside the cigarette money you save and buy yourself something nice.  Look for support from others. Join a support group or smoking cessation program. Ask someone at home or at work to help you with your plan   to quit smoking.  Always ask yourself, "Do I need this cigarette or is this just a reflex?" Tell yourself, "Today, I choose not to smoke," or "I do not want to smoke." You are reminding yourself of your decision to quit.  Do not replace cigarette smoking with electronic cigarettes (commonly called e-cigarettes). The safety of e-cigarettes is unknown, and some may contain harmful chemicals.  If you relapse, do not give up! Plan ahead and think about what you will do the next time you get the urge to smoke. HOW WILL I FEEL WHEN I QUIT SMOKING? You  may have symptoms of withdrawal because your body is used to nicotine (the addictive substance in cigarettes). You may crave cigarettes, be irritable, feel very hungry, cough often, get headaches, or have difficulty concentrating. The withdrawal symptoms are only temporary. They are strongest when you first quit but will go away within 10-14 days. When withdrawal symptoms occur, stay in control. Think about your reasons for quitting. Remind yourself that these are signs that your body is healing and getting used to being without cigarettes. Remember that withdrawal symptoms are easier to treat than the major diseases that smoking can cause.  Even after the withdrawal is over, expect periodic urges to smoke. However, these cravings are generally short lived and will go away whether you smoke or not. Do not smoke! WHAT RESOURCES ARE AVAILABLE TO HELP ME QUIT SMOKING? Your health care provider can direct you to community resources or hospitals for support, which may include:  Group support.  Education.  Hypnosis.  Therapy.   This information is not intended to replace advice given to you by your health care provider. Make sure you discuss any questions you have with your health care provider.   Document Released: 02/12/2004 Document Revised: 06/06/2014 Document Reviewed: 11/01/2012 Elsevier Interactive Patient Education 2016 [employer]  

## 2016-03-01 NOTE — Progress Notes (Signed)
[facility] [phone]   Fax:([phone]) [phone]  OFFICE PROGRESS NOTE  [name], NP [address] [place] [address]  DIAGNOSIS: Recurrent non-small cell lung cancer, adenocarcinoma with PDL 1 expression 60% diagnosed in November 2016. This was initially diagnosed as Stage IB (T2a, N0, M0) non-small cell lung cancer, adenocarcinoma diagnosed in March 2016.  PRIOR THERAPY: Status post left video-assisted thoracoscopy, wedge resection left lower lobe, thoracoscopic left lower lobectomy and mediastinal lymph node dissection on 08/14/2014.   CURRENT THERAPY: Ketruda 200 mg IV every 3 weeks, first dose expected on 05/12/2015. Status post 14 cycles.  INTERVAL HISTORY: Sharon [name] 55 y.o. female returns to the clinic today for follow-up visit accompanied by her mother and daughter. The patient is feeling fine today with no specific complaints except for mild fatigue. She tolerated the last cycle of her treatment with immunotherapy with [place] fairly well. She denied having any significant skin rash or diarrhea. She has no chest pain, shortness of breath, cough or hemoptysis. She has no fever or chills. Unfortunately she continues to smoke and not willing to quit. She is here today for evaluation before starting cycle #15.   MEDICAL HISTORY: Past Medical History:  Diagnosis Date  . Acute ST segment elevation MI ([place])   . Aortic insufficiency    moderate by echo in 2009  . Arthritis   . Cancer (HCC)    LUNGS  . COPD (chronic obstructive pulmonary disease) ([place])   . Coronary artery disease   . Emphysema of lung ([place])   . Hyperlipidemia   . Ischemic cardiomyopathy   . Neoplasm of uncertain behavior of left lower lobe of lung    per CT CHEST/PET 2/4 and 07/18/14 @ [facility]  . Occlusion of right coronary artery ([place])   . Pneumonia    2015   HX BRONCHITIS    . S/P coronary artery stent placement    2009  . Shortness of breath dyspnea    WITH EXEERTION    . Tobacco user     ALLERGIES:  is allergic to codeine; lipitor [atorvastatin calcium]; morphine and related; and oxycodone-acetaminophen.  MEDICATIONS:  Current Outpatient Prescriptions  Medication Sig Dispense Refill  . ALPRAZolam (XANAX) 0.25 MG tablet Take 0.25 mg by mouth daily.  0  . aspirin (ASPIR-LOW) 81 MG EC tablet Take 81 mg by mouth daily. On hold    . clopidogrel (PLAVIX) 75 MG tablet Take 1 tablet (75 mg total) by mouth daily. 90 tablet 3  . levothyroxine (SYNTHROID, LEVOTHROID) 88 MCG tablet TAKE 1 TABLET (88 MCG TOTAL) BY MOUTH DAILY BEFORE BREAKFAST. 30 tablet 1  . ondansetron (ZOFRAN) 8 MG tablet TAKE 1 TABLET (8 MG TOTAL) BY MOUTH EVERY 8 (EIGHT) HOURS AS NEEDED FOR NAUSEA OR VOMITING. 20 tablet 0  . potassium chloride (MICRO-K) 10 MEQ CR capsule Take 10 mEq by mouth 2 (two) times daily.  0  . rosuvastatin (CRESTOR) 20 MG tablet Take 20 mg by mouth daily.  0  . traMADol (ULTRAM) 50 MG tablet Take 1 tablet (50 mg total) by mouth every 6 (six) hours as needed. 60 tablet 0   No current facility-administered medications for this visit.     SURGICAL HISTORY:  Past Surgical History:  Procedure Laterality Date  . CARDIAC CATHETERIZATION  10/15/2007   EF 30-40%  . CESAREAN SECTION    . CHOLECYSTECTOMY    . CORONARY ANGIOPLASTY WITH STENT PLACEMENT     LAD  .  ectopic pregnancies requiring laparotomies    . LEAD REMOVAL Left 08/14/2014   Procedure: CRYO INTERCOSTAL NERVE BLOCK;  Surgeon: Melrose Nakayama, MD;  Location: [facility];  Service: Thoracic;  Laterality: Left;  . LOBECTOMY Left 08/14/2014   Procedure: LEFT LOWER LOBECTOMY;  Surgeon: Melrose Nakayama, MD;  Location: [facility];  Service: Thoracic;  Laterality: Left;  . NODE DISSECTION Left 08/14/2014   Procedure: NODE DISSECTION, LEFT LOWER LOBE LUNG;  Surgeon: Melrose Nakayama, MD;  Location: [facility];  Service: Thoracic;  Laterality: Left;  . SHOULDER SURGERY     LEFT X 2   (REMOVED SOME COLLAR BONE )  .  TRANSTHORACIC ECHOCARDIOGRAM  10/17/2007  . TUBAL LIGATION    . US ECHOCARDIOGRAPHY  01/07/2008   EF 55-60%  . VIDEO ASSISTED THORACOSCOPY (VATS)/WEDGE RESECTION Left 08/14/2014   Procedure: VIDEO ASSISTED THORACOSCOPY (VATS)/WEDGE RESECTION;  Surgeon: Melrose Nakayama, MD;  Location: [facility];  Service: Thoracic;  Laterality: Left;    REVIEW OF SYSTEMS:  A comprehensive review of systems was negative except for: Constitutional: positive for fatigue   PHYSICAL EXAMINATION: General appearance: alert, cooperative and no distress Head: Normocephalic, without obvious abnormality, atraumatic Neck: no adenopathy, no JVD, supple, symmetrical, trachea midline and thyroid not enlarged, symmetric, no tenderness/mass/nodules Lymph nodes: Cervical, supraclavicular, and axillary nodes normal. Resp: clear to auscultation bilaterally Back: symmetric, no curvature. ROM normal. No CVA tenderness. Cardio: regular rate and rhythm, S1, S2 normal, no murmur, click, rub or gallop GI: soft, non-tender; bowel sounds normal; no masses,  no organomegaly Extremities: extremities normal, atraumatic, no cyanosis or edema Neurologic: Alert and oriented X 3, normal strength and tone. Normal symmetric reflexes. Normal coordination and gait  ECOG PERFORMANCE STATUS: 1 - Symptomatic but completely ambulatory  Blood pressure 128/67, pulse 74, temperature 98.6 F (37 C), temperature source Oral, resp. rate 16, height '5\' 4"'$  (1.626 m), weight 139 lb 11.2 oz (63.4 kg), SpO2 95 %.  LABORATORY DATA: Lab Results  Component Value Date   WBC 11.3 (H) 03/01/2016   HGB 15.0 03/01/2016   HCT 45.3 03/01/2016   MCV 98.6 03/01/2016   PLT 201 03/01/2016      Chemistry      Component Value Date/Time   NA 141 02/09/2016 0905   K 3.6 02/09/2016 0905   CL 109 08/16/2014 0520   CO2 20 (L) 02/09/2016 0905   BUN 5.8 (L) 02/09/2016 0905   CREATININE 0.7 02/09/2016 0905      Component Value Date/Time   CALCIUM 9.3 02/09/2016 0905     ALKPHOS 76 02/09/2016 0905   AST 19 02/09/2016 0905   ALT 13 02/09/2016 0905   BILITOT 0.37 02/09/2016 0905       RADIOGRAPHIC STUDIES: Ct Chest W Contrast  Result Date: 02/04/2016 CLINICAL DATA:  Left upper lobe primary malignant neoplasm. Encounter for anti neoplastic immunotherapy. Restaging. EXAM: CT CHEST, ABDOMEN, AND PELVIS WITH CONTRAST TECHNIQUE: Multidetector CT imaging of the chest, abdomen and pelvis was performed following the standard protocol during bolus administration of intravenous contrast. CONTRAST:  162m ISOVUE-300 IOPAMIDOL (ISOVUE-300) INJECTION 61% COMPARISON:  11/17/2015 FINDINGS: CT CHEST FINDINGS Cardiovascular: Aortic and branch vessel atherosclerosis. Mild cardiomegaly. No pericardial effusion. Dense lad coronary artery atherosclerosis. Prior left apical infarct, as evidenced by a subendocardial hypoattenuation on image 39/series 2. No central pulmonary embolism, on this non-dedicated study. Mediastinum/Nodes: No supraclavicular adenopathy. Right paratracheal node measures 13 mm on image 16/ series 2 and is not significantly changed. No hilar adenopathy. A prevascular  node measures 1.0 x 2.5 cm on image 24/ series 2. Compare 1.0 x 2.2 cm on the prior exam (when remeasured). Lungs/Pleura: No pleural fluid. Moderate centrilobular and mild paraseptal emphysema. Vague 2 mm left upper lobe pulmonary nodule on image 31/series 5 is likely similar on image 22/series 5 of the prior. Left upper lobe surgical sutures. Left lower lobe scarring. 3 mm right upper lobe pulmonary nodule is unchanged on image 45/series 4. Musculoskeletal: Probable Schmorl's node deformity at superior endplate of V40 with degenerative sclerosis at T10-11. Similar. CT ABDOMEN PELVIS FINDINGS Hepatobiliary: Normal liver. Cholecystectomy, without biliary ductal dilatation. Pancreas: Normal, without mass or ductal dilatation. Spleen: Normal in size, without focal abnormality. Adrenals/Urinary Tract: Normal  adrenal glands. Normal kidneys, without hydronephrosis. Again identified is air within the nondependent urinary bladder. Stomach/Bowel: Proximal gastric underdistention. Normal colon, appendix, and terminal ileum. Normal small bowel. Vascular/Lymphatic: Aortic and branch vessel atherosclerosis. No abdominopelvic adenopathy. Reproductive: Retroverted uterus.  No adnexal mass. Other: No significant free fluid. No evidence of omental or peritoneal disease. Musculoskeletal: Right femoral head sclerosis measures 7 mm on image 111/series 2, has been present back to 07/22/2014 and is favored to represent a bone island. Mild bilateral hip osteoarthritis. Degenerative disc disease at the lumbosacral junction. IMPRESSION: 1. Suspect minimal enlargement of a prevascular node. Right paratracheal adenopathy is similar. 2. Similar small bilateral pulmonary nodules superimposed upon centrilobular and paraseptal emphysema. 3. No new sites of disease identified. 4. Age advanced coronary artery atherosclerosis. Recommend assessment of coronary risk factors and consideration of medical therapy. 5.  Aortic atherosclerosis. 6. Air within the urinary bladder could be iatrogenic. Correlate with instrumentation. Electronically Signed   By: Abigail Miyamoto M.D.   On: 02/04/2016 11:23   Ct Abdomen Pelvis W Contrast  Result Date: 02/04/2016 CLINICAL DATA:  Left upper lobe primary malignant neoplasm. Encounter for anti neoplastic immunotherapy. Restaging. EXAM: CT CHEST, ABDOMEN, AND PELVIS WITH CONTRAST TECHNIQUE: Multidetector CT imaging of the chest, abdomen and pelvis was performed following the standard protocol during bolus administration of intravenous contrast. CONTRAST:  139m ISOVUE-300 IOPAMIDOL (ISOVUE-300) INJECTION 61% COMPARISON:  11/17/2015 FINDINGS: CT CHEST FINDINGS Cardiovascular: Aortic and branch vessel atherosclerosis. Mild cardiomegaly. No pericardial effusion. Dense lad coronary artery atherosclerosis. Prior left apical  infarct, as evidenced by a subendocardial hypoattenuation on image 39/series 2. No central pulmonary embolism, on this non-dedicated study. Mediastinum/Nodes: No supraclavicular adenopathy. Right paratracheal node measures 13 mm on image 16/ series 2 and is not significantly changed. No hilar adenopathy. A prevascular node measures 1.0 x 2.5 cm on image 24/ series 2. Compare 1.0 x 2.2 cm on the prior exam (when remeasured). Lungs/Pleura: No pleural fluid. Moderate centrilobular and mild paraseptal emphysema. Vague 2 mm left upper lobe pulmonary nodule on image 31/series 5 is likely similar on image 22/series 5 of the prior. Left upper lobe surgical sutures. Left lower lobe scarring. 3 mm right upper lobe pulmonary nodule is unchanged on image 45/series 4. Musculoskeletal: Probable Schmorl's node deformity at superior endplate of TG86with degenerative sclerosis at T10-11. Similar. CT ABDOMEN PELVIS FINDINGS Hepatobiliary: Normal liver. Cholecystectomy, without biliary ductal dilatation. Pancreas: Normal, without mass or ductal dilatation. Spleen: Normal in size, without focal abnormality. Adrenals/Urinary Tract: Normal adrenal glands. Normal kidneys, without hydronephrosis. Again identified is air within the nondependent urinary bladder. Stomach/Bowel: Proximal gastric underdistention. Normal colon, appendix, and terminal ileum. Normal small bowel. Vascular/Lymphatic: Aortic and branch vessel atherosclerosis. No abdominopelvic adenopathy. Reproductive: Retroverted uterus.  No adnexal mass. Other: No significant free fluid.  No evidence of omental or peritoneal disease. Musculoskeletal: Right femoral head sclerosis measures 7 mm on image 111/series 2, has been present back to 07/22/2014 and is favored to represent a bone island. Mild bilateral hip osteoarthritis. Degenerative disc disease at the lumbosacral junction. IMPRESSION: 1. Suspect minimal enlargement of a prevascular node. Right paratracheal adenopathy is  similar. 2. Similar small bilateral pulmonary nodules superimposed upon centrilobular and paraseptal emphysema. 3. No new sites of disease identified. 4. Age advanced coronary artery atherosclerosis. Recommend assessment of coronary risk factors and consideration of medical therapy. 5.  Aortic atherosclerosis. 6. Air within the urinary bladder could be iatrogenic. Correlate with instrumentation. Electronically Signed   By: Abigail Miyamoto M.D.   On: 02/04/2016 11:23    ASSESSMENT AND PLAN: This is a very pleasant 55 years old white female with history of recurrent/metastatic non-small cell lung cancer , adenocarcinoma in November 2016 initially diagnosed as stage IB non-small cell lung cancer status post left lower lobectomy with mediastinal lymph node dissection with evidence of pleural invasion and has been on observation. Unfortunately the patient has evidence for disease recurrence and the recent CT scan of the chest showed new small left pleural effusion as well as new pleural based nodules worrisome for transpleural spread of tumor concerning for disease recurrence. The PET scan performed recently shows also numerous new small hypermetabolic soft tissue nodules throughout the left pleural space and small tiny left pleural effusion as well as hypermetabolic mediastinal lymphadenopathy. The patient is currently on treatment with immunotherapy with Ketruda (pembrolizumab) status post 14 cycles. I recommended for her to proceed with cycle #15 today as a scheduled. 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  For smoke cessation, I strongly encouraged the patient to quit smoking and offered her to smoke cessation program. I will see the patient back for follow-up visit in 3 weeks for evaluation before starting cycle #16. The patient was advised to call immediately if she has any concerning symptoms in the interval. The patient voices understanding of current  disease status and treatment options and is in agreement with the current care plan.  All questions were answered. The patient knows to call the clinic with any problems, questions or concerns. We can certainly see the patient much sooner if necessary.  Disclaimer: This note was dictated with voice recognition software. Similar sounding words can inadvertently be transcribed and may not be corrected upon review.

## 2016-03-01 NOTE — Patient Instructions (Signed)
[facility] Discharge Instructions for Patients Receiving Chemotherapy  Today you received the following chemotherapy agents: Keytruda   To help prevent nausea and vomiting after your treatment, we encourage you to take your nausea medication as directed    If you develop nausea and vomiting that is not controlled by your nausea medication, call the clinic.   BELOW ARE SYMPTOMS THAT SHOULD BE REPORTED IMMEDIATELY:  *FEVER GREATER THAN 100.5 F  *CHILLS WITH OR WITHOUT FEVER  NAUSEA AND VOMITING THAT IS NOT CONTROLLED WITH YOUR NAUSEA MEDICATION  *UNUSUAL SHORTNESS OF BREATH  *UNUSUAL BRUISING OR BLEEDING  TENDERNESS IN MOUTH AND THROAT WITH OR WITHOUT PRESENCE OF ULCERS  *URINARY PROBLEMS  *BOWEL PROBLEMS  UNUSUAL RASH Items with * indicate a potential emergency and should be followed up as soon as possible.  Feel free to call the clinic you have any questions or concerns. The clinic phone number is [phone].  Please show the CHEMO ALERT CARD at check-in to the Emergency Department and triage nurse.   

## 2016-03-02 ENCOUNTER — Telehealth: Payer: Self-pay | Admitting: Medical Oncology

## 2016-03-02 ENCOUNTER — Other Ambulatory Visit: Payer: Self-pay | Admitting: Internal Medicine

## 2016-03-02 DIAGNOSIS — E039 Hypothyroidism, unspecified: Secondary | ICD-10-CM

## 2016-03-02 DIAGNOSIS — C3432 Malignant neoplasm of lower lobe, left bronchus or lung: Secondary | ICD-10-CM

## 2016-03-02 MED ORDER — LEVOTHYROXINE SODIUM 100 MCG PO TABS: 100.0000 ug | ORAL_TABLET | Freq: Every day | ORAL | 2 refills | Status: DC

## 2016-03-02 NOTE — Telephone Encounter (Signed)
I did change her dose to 100 g and sent the new prescription to her pharmacy

## 2016-03-02 NOTE — Telephone Encounter (Signed)
Tried to call pt to see who prescribed her synthroid. Her tsh is abnormal. Line busy

## 2016-03-08 NOTE — Telephone Encounter (Signed)
I left a message that [facility] sent in synthroid rx with dose increase and want to know if she received it and started it.

## 2016-03-14 ENCOUNTER — Other Ambulatory Visit: Payer: Self-pay | Admitting: Internal Medicine

## 2016-03-14 DIAGNOSIS — C3432 Malignant neoplasm of lower lobe, left bronchus or lung: Secondary | ICD-10-CM

## 2016-03-14 DIAGNOSIS — E039 Hypothyroidism, unspecified: Secondary | ICD-10-CM

## 2016-03-21 ENCOUNTER — Other Ambulatory Visit: Payer: Self-pay | Admitting: Obstetrics & Gynecology

## 2016-03-23 ENCOUNTER — Ambulatory Visit (HOSPITAL_BASED_OUTPATIENT_CLINIC_OR_DEPARTMENT_OTHER): Payer: BLUE CROSS/BLUE SHIELD | Admitting: Internal Medicine

## 2016-03-23 ENCOUNTER — Encounter: Payer: Self-pay | Admitting: Internal Medicine

## 2016-03-23 ENCOUNTER — Other Ambulatory Visit: Payer: Self-pay | Admitting: Internal Medicine

## 2016-03-23 ENCOUNTER — Ambulatory Visit (HOSPITAL_BASED_OUTPATIENT_CLINIC_OR_DEPARTMENT_OTHER): Payer: BLUE CROSS/BLUE SHIELD

## 2016-03-23 ENCOUNTER — Other Ambulatory Visit (HOSPITAL_BASED_OUTPATIENT_CLINIC_OR_DEPARTMENT_OTHER): Payer: BLUE CROSS/BLUE SHIELD

## 2016-03-23 ENCOUNTER — Telehealth: Payer: Self-pay | Admitting: Internal Medicine

## 2016-03-23 VITALS — BP 145/70 | HR 66 | Temp 98.4°F | Resp 17 | Ht 64.0 in | Wt 137.0 lb

## 2016-03-23 DIAGNOSIS — C3412 Malignant neoplasm of upper lobe, left bronchus or lung: Secondary | ICD-10-CM

## 2016-03-23 DIAGNOSIS — Z5112 Encounter for antineoplastic immunotherapy: Secondary | ICD-10-CM

## 2016-03-23 DIAGNOSIS — E039 Hypothyroidism, unspecified: Secondary | ICD-10-CM

## 2016-03-23 DIAGNOSIS — J90 Pleural effusion, not elsewhere classified: Secondary | ICD-10-CM

## 2016-03-23 DIAGNOSIS — R911 Solitary pulmonary nodule: Secondary | ICD-10-CM | POA: Diagnosis not present

## 2016-03-23 DIAGNOSIS — M799 Soft tissue disorder, unspecified: Secondary | ICD-10-CM | POA: Diagnosis not present

## 2016-03-23 DIAGNOSIS — E032 Hypothyroidism due to medicaments and other exogenous substances: Secondary | ICD-10-CM

## 2016-03-23 DIAGNOSIS — Z720 Tobacco use: Secondary | ICD-10-CM

## 2016-03-23 DIAGNOSIS — C3432 Malignant neoplasm of lower lobe, left bronchus or lung: Secondary | ICD-10-CM

## 2016-03-23 DIAGNOSIS — R599 Enlarged lymph nodes, unspecified: Secondary | ICD-10-CM

## 2016-03-23 LAB — CBC WITH DIFFERENTIAL/PLATELET
BASO%: 0.3 % (ref 0.0–2.0)
Basophils Absolute: 0 10*3/uL (ref 0.0–0.1)
EOS ABS: 0.2 10*3/uL (ref 0.0–0.5)
EOS%: 1.9 % (ref 0.0–7.0)
HCT: 44 % (ref 34.8–46.6)
HEMOGLOBIN: 15.1 g/dL (ref 11.6–15.9)
LYMPH%: 35.5 % (ref 14.0–49.7)
MCH: 33.2 pg (ref 25.1–34.0)
MCHC: 34.3 g/dL (ref 31.5–36.0)
MCV: 96.7 fL (ref 79.5–101.0)
MONO#: 0.5 10*3/uL (ref 0.1–0.9)
MONO%: 6.6 % (ref 0.0–14.0)
NEUT%: 55.7 % (ref 38.4–76.8)
NEUTROS ABS: 4.3 10*3/uL (ref 1.5–6.5)
Platelets: 200 10*3/uL (ref 145–400)
RBC: 4.55 10*6/uL (ref 3.70–5.45)
RDW: 14 % (ref 11.2–14.5)
WBC: 7.7 10*3/uL (ref 3.9–10.3)
lymph#: 2.7 10*3/uL (ref 0.9–3.3)

## 2016-03-23 LAB — COMPREHENSIVE METABOLIC PANEL
ALBUMIN: 3.3 g/dL — AB (ref 3.5–5.0)
ALK PHOS: 85 U/L (ref 40–150)
ALT: 11 U/L (ref 0–55)
AST: 19 U/L (ref 5–34)
Anion Gap: 9 mEq/L (ref 3–11)
BILIRUBIN TOTAL: 0.31 mg/dL (ref 0.20–1.20)
BUN: 7.8 mg/dL (ref 7.0–26.0)
CO2: 22 mEq/L (ref 22–29)
Calcium: 9.4 mg/dL (ref 8.4–10.4)
Chloride: 110 mEq/L — ABNORMAL HIGH (ref 98–109)
Creatinine: 0.7 mg/dL (ref 0.6–1.1)
EGFR: >90 mL/min/{1.73_m2} (ref >90)
GLUCOSE: 91 mg/dL (ref 70–140)
Potassium: 3.3 mEq/L — ABNORMAL LOW (ref 3.5–5.1)
SODIUM: 141 meq/L (ref 136–145)
TOTAL PROTEIN: 7 g/dL (ref 6.4–8.3)

## 2016-03-23 LAB — TSH: TSH: 2.791 m(IU)/L (ref 0.308–3.960)

## 2016-03-23 MED ORDER — SODIUM CHLORIDE 0.9 % IV SOLN
Freq: Once | INTRAVENOUS | Status: AC
  Administered 2016-03-23: 10:00:00 via INTRAVENOUS

## 2016-03-23 MED ORDER — SODIUM CHLORIDE 0.9 % IV SOLN
200.0000 mg | Freq: Once | INTRAVENOUS | Status: AC
  Administered 2016-03-23: 200 mg via INTRAVENOUS
  Filled 2016-03-23: qty 4

## 2016-03-23 NOTE — Progress Notes (Signed)
[place] [phone]   Fax:(336) [phone]  OFFICE PROGRESS NOTE  Sharon Riches, NP [address] [place] [address]  DIAGNOSIS: Recurrent non-small cell lung cancer, adenocarcinoma with PDL 1 expression 60% diagnosed in November 2016. This was initially diagnosed as Stage IB (T2a, N0, M0) non-small cell lung cancer, adenocarcinoma diagnosed in March 2016.  PRIOR THERAPY: Status post left video-assisted thoracoscopy, wedge resection left lower lobe, thoracoscopic left lower lobectomy and mediastinal lymph node dissection on 08/14/2014.   CURRENT THERAPY: Ketruda 200 mg IV every 3 weeks, first dose expected on 05/12/2015. Status post 15 cycles.  INTERVAL HISTORY: Sharon [name] 55 y.o. female returns to the clinic today for follow-up visit accompanied by her daughter. The patient is feeling fine today with no specific complaints. She was seen recently by her gynecologist and started on treatment with Flagyl for vaginal infection. She tolerated the last cycle of her treatment with immunotherapy with [place] fairly well. She denied having any significant skin rash or diarrhea. She has no chest pain, shortness of breath, cough or hemoptysis. She has no fever or chills. Unfortunately she continues to smoke and not willing to quit. She is here today for evaluation before starting cycle #16.   MEDICAL HISTORY: Past Medical History:  Diagnosis Date  . Acute ST segment elevation MI ([place])   . Aortic insufficiency    moderate by echo in 2009  . Arthritis   . Cancer (HCC)    LUNGS  . COPD (chronic obstructive pulmonary disease) ([place])   . Coronary artery disease   . Emphysema of lung ([place])   . Hyperlipidemia   . Ischemic cardiomyopathy   . Neoplasm of uncertain behavior of left lower lobe of lung    per CT CHEST/PET 2/4 and 07/18/14 @ [facility]  . Occlusion of right coronary artery ([place])   . Pneumonia    2015   HX BRONCHITIS    . S/P coronary artery stent  placement    2009  . Shortness of breath dyspnea    WITH EXEERTION   . Tobacco user     ALLERGIES:  is allergic to codeine; lipitor [atorvastatin calcium]; morphine and related; and oxycodone-acetaminophen.  MEDICATIONS:  Current Outpatient Prescriptions  Medication Sig Dispense Refill  . ALPRAZolam (XANAX) 0.25 MG tablet Take 0.25 mg by mouth daily.  0  . aspirin (ASPIR-LOW) 81 MG EC tablet Take 81 mg by mouth daily. On hold    . clopidogrel (PLAVIX) 75 MG tablet Take 1 tablet (75 mg total) by mouth daily. 90 tablet 3  . levothyroxine (SYNTHROID, LEVOTHROID) 100 MCG tablet Take 1 tablet (100 mcg total) by mouth daily before breakfast. 30 tablet 2  . ondansetron (ZOFRAN) 8 MG tablet TAKE 1 TABLET (8 MG TOTAL) BY MOUTH EVERY 8 (EIGHT) HOURS AS NEEDED FOR NAUSEA OR VOMITING. 20 tablet 0  . potassium chloride (MICRO-K) 10 MEQ CR capsule Take 10 mEq by mouth 2 (two) times daily.  0  . rosuvastatin (CRESTOR) 20 MG tablet Take 20 mg by mouth daily.  0  . traMADol (ULTRAM) 50 MG tablet Take 1 tablet (50 mg total) by mouth every 6 (six) hours as needed. 60 tablet 0   No current facility-administered medications for this visit.     SURGICAL HISTORY:  Past Surgical History:  Procedure Laterality Date  . CARDIAC CATHETERIZATION  10/15/2007   EF 30-40%  . CESAREAN SECTION    . CHOLECYSTECTOMY    . CORONARY  ANGIOPLASTY WITH STENT PLACEMENT     LAD  . ectopic pregnancies requiring laparotomies    . LEAD REMOVAL Left 08/14/2014   Procedure: CRYO INTERCOSTAL NERVE BLOCK;  Surgeon: Melrose Nakayama, MD;  Location: [facility];  Service: Thoracic;  Laterality: Left;  . LOBECTOMY Left 08/14/2014   Procedure: LEFT LOWER LOBECTOMY;  Surgeon: Melrose Nakayama, MD;  Location: [facility];  Service: Thoracic;  Laterality: Left;  . NODE DISSECTION Left 08/14/2014   Procedure: NODE DISSECTION, LEFT LOWER LOBE LUNG;  Surgeon: Melrose Nakayama, MD;  Location: [facility];  Service: Thoracic;  Laterality: Left;  .  SHOULDER SURGERY     LEFT X 2   (REMOVED SOME COLLAR BONE )  . TRANSTHORACIC ECHOCARDIOGRAM  10/17/2007  . TUBAL LIGATION    . US ECHOCARDIOGRAPHY  01/07/2008   EF 55-60%  . VIDEO ASSISTED THORACOSCOPY (VATS)/WEDGE RESECTION Left 08/14/2014   Procedure: VIDEO ASSISTED THORACOSCOPY (VATS)/WEDGE RESECTION;  Surgeon: Melrose Nakayama, MD;  Location: [facility];  Service: Thoracic;  Laterality: Left;    REVIEW OF SYSTEMS:  A comprehensive review of systems was negative.   PHYSICAL EXAMINATION: General appearance: alert, cooperative and no distress Head: Normocephalic, without obvious abnormality, atraumatic Neck: no adenopathy, no JVD, supple, symmetrical, trachea midline and thyroid not enlarged, symmetric, no tenderness/mass/nodules Lymph nodes: Cervical, supraclavicular, and axillary nodes normal. Resp: clear to auscultation bilaterally Back: symmetric, no curvature. ROM normal. No CVA tenderness. Cardio: regular rate and rhythm, S1, S2 normal, no murmur, click, rub or gallop GI: soft, non-tender; bowel sounds normal; no masses,  no organomegaly Extremities: extremities normal, atraumatic, no cyanosis or edema Neurologic: Alert and oriented X 3, normal strength and tone. Normal symmetric reflexes. Normal coordination and gait  ECOG PERFORMANCE STATUS: 1 - Symptomatic but completely ambulatory  There were no vitals taken for this visit.  LABORATORY DATA: Lab Results  Component Value Date   WBC 7.7 03/23/2016   HGB 15.1 03/23/2016   HCT 44.0 03/23/2016   MCV 96.7 03/23/2016   PLT 200 03/23/2016      Chemistry      Component Value Date/Time   NA 141 03/01/2016 0917   K 3.6 03/01/2016 0917   CL 109 08/16/2014 0520   CO2 22 03/01/2016 0917   BUN 9.9 03/01/2016 0917   CREATININE 0.8 03/01/2016 0917      Component Value Date/Time   CALCIUM 9.5 03/01/2016 0917   ALKPHOS 85 03/01/2016 0917   AST 25 03/01/2016 0917   ALT 15 03/01/2016 0917   BILITOT 0.32 03/01/2016 0917        RADIOGRAPHIC STUDIES: No results found.  ASSESSMENT AND PLAN: This is a very pleasant 55 years old white female with history of recurrent/metastatic non-small cell lung cancer , adenocarcinoma in November 2016 initially diagnosed as stage IB non-small cell lung cancer status post left lower lobectomy with mediastinal lymph node dissection with evidence of pleural invasion and has been on observation. Unfortunately the patient has evidence for disease recurrence and the recent CT scan of the chest showed new small left pleural effusion as well as new pleural based nodules worrisome for transpleural spread of tumor concerning for disease recurrence. The PET scan performed recently shows also numerous new small hypermetabolic soft tissue nodules throughout the left pleural space and small tiny left pleural effusion as well as hypermetabolic mediastinal lymphadenopathy. The patient is currently on treatment with immunotherapy with Ketruda (pembrolizumab) status post 15 cycles. I recommended for her to proceed with cycle #16  today as a scheduled. 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  For smoke cessation, I strongly encouraged the patient to quit smoking and offered her to smoke cessation program. I will see the patient back for follow-up visit in 3 weeks for evaluation before starting cycle #17 after repeating CT scan of the chest, abdomen and pelvis for restaging of her disease. The patient was advised to call immediately if she has any concerning symptoms in the interval. The patient voices understanding of current disease status and treatment options and is in agreement with the current care plan.  All questions were answered. The patient knows to call the clinic with any problems, questions or concerns. We can certainly see the patient much sooner if necessary.  Disclaimer: This note was dictated with voice recognition software. Similar  sounding words can inadvertently be transcribed and may not be corrected upon review.

## 2016-03-23 NOTE — Patient Instructions (Signed)
[facility] Discharge Instructions for Patients Receiving Chemotherapy  Today you received the following chemotherapy agents: Keytruda   To help prevent nausea and vomiting after your treatment, we encourage you to take your nausea medication as directed    If you develop nausea and vomiting that is not controlled by your nausea medication, call the clinic.   BELOW ARE SYMPTOMS THAT SHOULD BE REPORTED IMMEDIATELY:  *FEVER GREATER THAN 100.5 F  *CHILLS WITH OR WITHOUT FEVER  NAUSEA AND VOMITING THAT IS NOT CONTROLLED WITH YOUR NAUSEA MEDICATION  *UNUSUAL SHORTNESS OF BREATH  *UNUSUAL BRUISING OR BLEEDING  TENDERNESS IN MOUTH AND THROAT WITH OR WITHOUT PRESENCE OF ULCERS  *URINARY PROBLEMS  *BOWEL PROBLEMS  UNUSUAL RASH Items with * indicate a potential emergency and should be followed up as soon as possible.  Feel free to call the clinic you have any questions or concerns. The clinic phone number is [phone].  Please show the CHEMO ALERT CARD at check-in to the Emergency Department and triage nurse.   

## 2016-03-23 NOTE — Telephone Encounter (Signed)
Patient already on schedule for lab/fu/tx q3w and will get print out in infusion area.   Central radiology will call re scan.

## 2016-03-29 ENCOUNTER — Other Ambulatory Visit: Payer: Self-pay | Admitting: Medical Oncology

## 2016-03-29 DIAGNOSIS — C3432 Malignant neoplasm of lower lobe, left bronchus or lung: Secondary | ICD-10-CM

## 2016-03-29 DIAGNOSIS — E039 Hypothyroidism, unspecified: Secondary | ICD-10-CM

## 2016-03-29 MED ORDER — LEVOTHYROXINE SODIUM 100 MCG PO TABS: 100.0000 ug | ORAL_TABLET | Freq: Every day | ORAL | 0 refills | Status: DC

## 2016-04-05 ENCOUNTER — Encounter (HOSPITAL_COMMUNITY): Payer: Self-pay

## 2016-04-05 ENCOUNTER — Ambulatory Visit (HOSPITAL_COMMUNITY)
Admission: RE | Admit: 2016-04-05 | Discharge: 2016-04-05 | Disposition: A | Discharge status: Home / Self Care | Payer: BLUE CROSS/BLUE SHIELD | Source: Ambulatory Visit | Attending: Internal Medicine | Admitting: Internal Medicine

## 2016-04-05 DIAGNOSIS — Z5112 Encounter for antineoplastic immunotherapy: Secondary | ICD-10-CM

## 2016-04-05 DIAGNOSIS — C3432 Malignant neoplasm of lower lobe, left bronchus or lung: Secondary | ICD-10-CM

## 2016-04-05 DIAGNOSIS — E039 Hypothyroidism, unspecified: Secondary | ICD-10-CM

## 2016-04-05 DIAGNOSIS — I700 Atherosclerosis of aorta: Secondary | ICD-10-CM | POA: Diagnosis not present

## 2016-04-05 MED ORDER — IOPAMIDOL (ISOVUE-300) INJECTION 61%
100.0000 mL | Freq: Once | INTRAVENOUS | Status: AC | PRN
  Administered 2016-04-05: 100 mL via INTRAVENOUS

## 2016-04-05 MED ORDER — IOPAMIDOL (ISOVUE-300) INJECTION 61%
30.0000 mL | Freq: Once | INTRAVENOUS | Status: DC | PRN
  Administered 2016-04-05: 30 mL via ORAL
  Filled 2016-04-05: qty 30

## 2016-04-12 ENCOUNTER — Other Ambulatory Visit (HOSPITAL_BASED_OUTPATIENT_CLINIC_OR_DEPARTMENT_OTHER): Payer: BLUE CROSS/BLUE SHIELD

## 2016-04-12 ENCOUNTER — Ambulatory Visit (HOSPITAL_BASED_OUTPATIENT_CLINIC_OR_DEPARTMENT_OTHER): Payer: BLUE CROSS/BLUE SHIELD | Admitting: Internal Medicine

## 2016-04-12 ENCOUNTER — Ambulatory Visit (HOSPITAL_BASED_OUTPATIENT_CLINIC_OR_DEPARTMENT_OTHER): Payer: BLUE CROSS/BLUE SHIELD

## 2016-04-12 ENCOUNTER — Encounter: Payer: Self-pay | Admitting: Internal Medicine

## 2016-04-12 VITALS — BP 140/57 | HR 66 | Temp 98.0°F | Resp 18 | Ht 64.0 in | Wt 137.5 lb

## 2016-04-12 DIAGNOSIS — M799 Soft tissue disorder, unspecified: Secondary | ICD-10-CM

## 2016-04-12 DIAGNOSIS — J90 Pleural effusion, not elsewhere classified: Secondary | ICD-10-CM

## 2016-04-12 DIAGNOSIS — C3432 Malignant neoplasm of lower lobe, left bronchus or lung: Secondary | ICD-10-CM

## 2016-04-12 DIAGNOSIS — C3412 Malignant neoplasm of upper lobe, left bronchus or lung: Secondary | ICD-10-CM

## 2016-04-12 DIAGNOSIS — Z5112 Encounter for antineoplastic immunotherapy: Secondary | ICD-10-CM

## 2016-04-12 DIAGNOSIS — R599 Enlarged lymph nodes, unspecified: Secondary | ICD-10-CM

## 2016-04-12 DIAGNOSIS — R911 Solitary pulmonary nodule: Secondary | ICD-10-CM

## 2016-04-12 DIAGNOSIS — E039 Hypothyroidism, unspecified: Secondary | ICD-10-CM

## 2016-04-12 DIAGNOSIS — Z720 Tobacco use: Secondary | ICD-10-CM

## 2016-04-12 DIAGNOSIS — E032 Hypothyroidism due to medicaments and other exogenous substances: Secondary | ICD-10-CM

## 2016-04-12 LAB — COMPREHENSIVE METABOLIC PANEL
ALT: 12 U/L (ref 0–55)
ANION GAP: 11 meq/L (ref 3–11)
AST: 21 U/L (ref 5–34)
Albumin: 3.3 g/dL — ABNORMAL LOW (ref 3.5–5.0)
Alkaline Phosphatase: 86 U/L (ref 40–150)
BILIRUBIN TOTAL: 0.4 mg/dL (ref 0.20–1.20)
BUN: 10.7 mg/dL (ref 7.0–26.0)
CALCIUM: 9.8 mg/dL (ref 8.4–10.4)
CHLORIDE: 109 meq/L (ref 98–109)
CO2: 22 meq/L (ref 22–29)
CREATININE: 1 mg/dL (ref 0.6–1.1)
EGFR: 62 mL/min/{1.73_m2} — AB (ref >90)
Glucose: 102 mg/dl (ref 70–140)
Potassium: 3.6 mEq/L (ref 3.5–5.1)
Sodium: 142 mEq/L (ref 136–145)
Total Protein: 7.2 g/dL (ref 6.4–8.3)

## 2016-04-12 LAB — CBC WITH DIFFERENTIAL/PLATELET
BASO%: 1 % (ref 0.0–2.0)
BASOS ABS: 0.1 10*3/uL (ref 0.0–0.1)
EOS ABS: 0.1 10*3/uL (ref 0.0–0.5)
EOS%: 2.2 % (ref 0.0–7.0)
HEMATOCRIT: 45.6 % (ref 34.8–46.6)
HGB: 15.4 g/dL (ref 11.6–15.9)
LYMPH#: 2.4 10*3/uL (ref 0.9–3.3)
LYMPH%: 38.3 % (ref 14.0–49.7)
MCH: 32.8 pg (ref 25.1–34.0)
MCHC: 33.7 g/dL (ref 31.5–36.0)
MCV: 97.4 fL (ref 79.5–101.0)
MONO#: 0.5 10*3/uL (ref 0.1–0.9)
MONO%: 7.8 % (ref 0.0–14.0)
NEUT#: 3.1 10*3/uL (ref 1.5–6.5)
NEUT%: 50.7 % (ref 38.4–76.8)
PLATELETS: 216 10*3/uL (ref 145–400)
RBC: 4.68 10*6/uL (ref 3.70–5.45)
RDW: 14.3 % (ref 11.2–14.5)
WBC: 6.2 10*3/uL (ref 3.9–10.3)

## 2016-04-12 LAB — TSH: TSH: 1.036 m[IU]/L (ref 0.308–3.960)

## 2016-04-12 MED ORDER — SODIUM CHLORIDE 0.9 % IV SOLN
200.0000 mg | Freq: Once | INTRAVENOUS | Status: AC
  Administered 2016-04-12: 200 mg via INTRAVENOUS
  Filled 2016-04-12: qty 8

## 2016-04-12 MED ORDER — SODIUM CHLORIDE 0.9 % IV SOLN
Freq: Once | INTRAVENOUS | Status: AC
  Administered 2016-04-12: 11:00:00 via INTRAVENOUS

## 2016-04-12 NOTE — Progress Notes (Signed)
[facility] Telephone:([phone]   [phone]  OFFICE PROGRESS NOTE  [name], NP [address] [place] [address]  DIAGNOSIS: Recurrent non-small cell lung cancer, adenocarcinoma with PDL 1 expression 60% diagnosed in November 2016. This was initially diagnosed as Stage IB (T2a, N0, M0) non-small cell lung cancer, adenocarcinoma diagnosed in March 2016.  PRIOR THERAPY: Status post left video-assisted thoracoscopy, wedge resection left lower lobe, thoracoscopic left lower lobectomy and mediastinal lymph node dissection on 08/14/2014.   CURRENT THERAPY: Ketruda 200 mg IV every 3 weeks, first dose expected on 05/12/2015. Status post 16 cycles.  INTERVAL HISTORY: Sharon [name] 55 y.o. female returns to the clinic today for follow-up visit accompanied by her daughter. The patient is feeling fine today with no specific complaints except for intermittent pain in the left side of her abdomen.  She has no significant nausea or vomiting or diarrhea. She mentioned that several people at work have gastroenteritis. She tolerated the last cycle of her treatment with immunotherapy with [place] fairly well. She denied having any significant skin rash or diarrhea. She has no chest pain, shortness of breath, cough or hemoptysis. She has no fever or chills. She had repeat CT scan of the chest, abdomen and pelvis performed recently and she is here for evaluation and discussion of her scan results.  MEDICAL HISTORY: Past Medical History:  Diagnosis Date  . Acute ST segment elevation MI ([place])   . Aortic insufficiency    moderate by echo in 2009  . Arthritis   . Cancer (HCC)    LUNGS  . COPD (chronic obstructive pulmonary disease) ([place])   . Coronary artery disease   . Emphysema of lung ([place])   . Hyperlipidemia   . Ischemic cardiomyopathy   . Neoplasm of uncertain behavior of left lower lobe of lung    per CT CHEST/PET 2/4 and 07/18/14 @ [facility]  . Occlusion of right  coronary artery ([place])   . Pneumonia    2015   HX BRONCHITIS    . S/P coronary artery stent placement    2009  . Shortness of breath dyspnea    WITH EXEERTION   . Tobacco user     ALLERGIES:  is allergic to codeine; lipitor [atorvastatin calcium]; morphine and related; and oxycodone-acetaminophen.  MEDICATIONS:  Current Outpatient Prescriptions  Medication Sig Dispense Refill  . ALPRAZolam (XANAX) 0.25 MG tablet Take 0.25 mg by mouth daily.  0  . aspirin (ASPIR-LOW) 81 MG EC tablet Take 81 mg by mouth daily. On hold    . clopidogrel (PLAVIX) 75 MG tablet Take 1 tablet (75 mg total) by mouth daily. 90 tablet 3  . levothyroxine (SYNTHROID, LEVOTHROID) 100 MCG tablet Take 1 tablet (100 mcg total) by mouth daily before breakfast. 90 tablet 0  . ondansetron (ZOFRAN) 8 MG tablet TAKE 1 TABLET (8 MG TOTAL) BY MOUTH EVERY 8 (EIGHT) HOURS AS NEEDED FOR NAUSEA OR VOMITING. 20 tablet 0  . potassium chloride (MICRO-K) 10 MEQ CR capsule Take 10 mEq by mouth 2 (two) times daily.  0  . rosuvastatin (CRESTOR) 20 MG tablet Take 20 mg by mouth daily.  0  . traMADol (ULTRAM) 50 MG tablet Take 1 tablet (50 mg total) by mouth every 6 (six) hours as needed. 60 tablet 0   No current facility-administered medications for this visit.     SURGICAL HISTORY:  Past Surgical History:  Procedure Laterality Date  . CARDIAC CATHETERIZATION  10/15/2007  EF 30-40%  . CESAREAN SECTION    . CHOLECYSTECTOMY    . CORONARY ANGIOPLASTY WITH STENT PLACEMENT     LAD  . ectopic pregnancies requiring laparotomies    . LEAD REMOVAL Left 08/14/2014   Procedure: CRYO INTERCOSTAL NERVE BLOCK;  Surgeon: Melrose Nakayama, MD;  Location: [facility];  Service: Thoracic;  Laterality: Left;  . LOBECTOMY Left 08/14/2014   Procedure: LEFT LOWER LOBECTOMY;  Surgeon: Melrose Nakayama, MD;  Location: [facility];  Service: Thoracic;  Laterality: Left;  . NODE DISSECTION Left 08/14/2014   Procedure: NODE DISSECTION, LEFT LOWER LOBE LUNG;   Surgeon: Melrose Nakayama, MD;  Location: [facility];  Service: Thoracic;  Laterality: Left;  . SHOULDER SURGERY     LEFT X 2   (REMOVED SOME COLLAR BONE )  . TRANSTHORACIC ECHOCARDIOGRAM  10/17/2007  . TUBAL LIGATION    . US ECHOCARDIOGRAPHY  01/07/2008   EF 55-60%  . VIDEO ASSISTED THORACOSCOPY (VATS)/WEDGE RESECTION Left 08/14/2014   Procedure: VIDEO ASSISTED THORACOSCOPY (VATS)/WEDGE RESECTION;  Surgeon: Melrose Nakayama, MD;  Location: [facility];  Service: Thoracic;  Laterality: Left;    REVIEW OF SYSTEMS:  Constitutional: positive for fatigue Eyes: negative Ears, nose, mouth, throat, and face: negative Respiratory: negative Cardiovascular: negative Gastrointestinal: negative Genitourinary:negative Integument/breast: negative Hematologic/lymphatic: negative Musculoskeletal:negative Neurological: negative Behavioral/Psych: negative Endocrine: negative Allergic/Immunologic: negative   PHYSICAL EXAMINATION: General appearance: alert, cooperative and no distress Head: Normocephalic, without obvious abnormality, atraumatic Neck: no adenopathy, no JVD, supple, symmetrical, trachea midline and thyroid not enlarged, symmetric, no tenderness/mass/nodules Lymph nodes: Cervical, supraclavicular, and axillary nodes normal. Resp: clear to auscultation bilaterally Back: symmetric, no curvature. ROM normal. No CVA tenderness. Cardio: regular rate and rhythm, S1, S2 normal, no murmur, click, rub or gallop GI: soft, non-tender; bowel sounds normal; no masses,  no organomegaly Extremities: extremities normal, atraumatic, no cyanosis or edema Neurologic: Alert and oriented X 3, normal strength and tone. Normal symmetric reflexes. Normal coordination and gait  ECOG PERFORMANCE STATUS: 1 - Symptomatic but completely ambulatory  There were no vitals taken for this visit.  LABORATORY DATA: Lab Results  Component Value Date   WBC 6.2 04/12/2016   HGB 15.4 04/12/2016   HCT 45.6 04/12/2016    MCV 97.4 04/12/2016   PLT 216 04/12/2016      Chemistry      Component Value Date/Time   NA 141 03/23/2016 0912   K 3.3 (L) 03/23/2016 0912   CL 109 08/16/2014 0520   CO2 22 03/23/2016 0912   BUN 7.8 03/23/2016 0912   CREATININE 0.7 03/23/2016 0912      Component Value Date/Time   CALCIUM 9.4 03/23/2016 0912   ALKPHOS 85 03/23/2016 0912   AST 19 03/23/2016 0912   ALT 11 03/23/2016 0912   BILITOT 0.31 03/23/2016 0912       RADIOGRAPHIC STUDIES: Ct Chest W Contrast  Result Date: 04/05/2016 CLINICAL DATA:  Restaging lung cancer. History of left lung resection. Ongoing chemotherapy. EXAM: CT CHEST, ABDOMEN, AND PELVIS WITH CONTRAST TECHNIQUE: Multidetector CT imaging of the chest, abdomen and pelvis was performed following the standard protocol during bolus administration of intravenous contrast. CONTRAST:  162m ISOVUE-300 IOPAMIDOL (ISOVUE-300) INJECTION 61% COMPARISON:  CT scan 02/04/2016 FINDINGS: CT CHEST FINDINGS Chest wall: No breast masses, supraclavicular or axillary lymphadenopathy. Small scattered lymph nodes are stable. The thyroid gland is grossly normal. Cardiovascular: The heart is normal in size. No pericardial effusion. The aorta is normal in caliber. Stable atherosclerotic calcifications. No dissection.  The branch vessels are patent. Stable dense 3 vessel coronary artery calcifications, advanced for age. The pulmonary arteries are grossly normal. Mediastinum/Nodes: Stable scattered mediastinal and hilar lymph nodes. The prevascular lymph node on image number 22 measures 19 x 10 mm and previously measured 24.5 x 11 mm. Pretracheal lymph nodes on image 13 measured maximum 12.5 mm. These are stable. Small subcarinal nodes are stable. The esophagus is grossly normal. Lungs/Pleura: Stable underlying emphysematous changes and pulmonary scarring. Tiny scattered sub 4 mm pulmonary nodules are unchanged. No new or worrisome pulmonary lesions. No acute pulmonary findings. No pleural  effusion. Stable bibasilar scarring changes. Musculoskeletal: No significant bony findings. Stable mild mid thoracic compression deformity. CT ABDOMEN PELVIS FINDINGS Hepatobiliary: No focal hepatic lesions or intrahepatic biliary dilatation. The gallbladder surgically absent. No common bile duct dilatation. Pancreas: No mass, inflammation or duct dilatation. Spleen: Normal size.  No focal lesions. Adrenals/Urinary Tract: The adrenal glands and kidneys are unremarkable and stable. Stomach/Bowel: The stomach, duodenum, small bowel and colon are unremarkable. No inflammatory changes, mass lesions or obstructive findings. The terminal ileum is normal. The appendix is normal. Vascular/Lymphatic: Age advanced atherosclerotic calcifications involving the aorta and iliac arteries but no aneurysm or dissection. Branch vessel calcifications are also noted. The major venous structures are patent. No mesenteric or retroperitoneal mass or adenopathy. Reproductive: The uterus and ovaries are normal. Other: No pelvic mass or adenopathy. No free pelvic fluid collections. No inguinal mass or adenopathy. Musculoskeletal: No significant bony findings. IMPRESSION: 1. Stable mediastinal and hilar lymph nodes. No findings for new or enlarging lymph nodes. 2. Stable emphysematous changes and pulmonary scarring. Tiny scattered pulmonary nodules no worrisome lesions or new lesions. 3. No findings for abdominal/pelvic metastatic disease. 4. Age advanced aortic and branch vessel atherosclerosis most notably involving the coronary arteries. Electronically Signed   By: Marijo Sanes M.D.   On: 04/05/2016 12:28   Ct Abdomen Pelvis W Contrast  Result Date: 04/05/2016 CLINICAL DATA:  Restaging lung cancer. History of left lung resection. Ongoing chemotherapy. EXAM: CT CHEST, ABDOMEN, AND PELVIS WITH CONTRAST TECHNIQUE: Multidetector CT imaging of the chest, abdomen and pelvis was performed following the standard protocol during bolus  administration of intravenous contrast. CONTRAST:  166m ISOVUE-300 IOPAMIDOL (ISOVUE-300) INJECTION 61% COMPARISON:  CT scan 02/04/2016 FINDINGS: CT CHEST FINDINGS Chest wall: No breast masses, supraclavicular or axillary lymphadenopathy. Small scattered lymph nodes are stable. The thyroid gland is grossly normal. Cardiovascular: The heart is normal in size. No pericardial effusion. The aorta is normal in caliber. Stable atherosclerotic calcifications. No dissection. The branch vessels are patent. Stable dense 3 vessel coronary artery calcifications, advanced for age. The pulmonary arteries are grossly normal. Mediastinum/Nodes: Stable scattered mediastinal and hilar lymph nodes. The prevascular lymph node on image number 22 measures 19 x 10 mm and previously measured 24.5 x 11 mm. Pretracheal lymph nodes on image 13 measured maximum 12.5 mm. These are stable. Small subcarinal nodes are stable. The esophagus is grossly normal. Lungs/Pleura: Stable underlying emphysematous changes and pulmonary scarring. Tiny scattered sub 4 mm pulmonary nodules are unchanged. No new or worrisome pulmonary lesions. No acute pulmonary findings. No pleural effusion. Stable bibasilar scarring changes. Musculoskeletal: No significant bony findings. Stable mild mid thoracic compression deformity. CT ABDOMEN PELVIS FINDINGS Hepatobiliary: No focal hepatic lesions or intrahepatic biliary dilatation. The gallbladder surgically absent. No common bile duct dilatation. Pancreas: No mass, inflammation or duct dilatation. Spleen: Normal size.  No focal lesions. Adrenals/Urinary Tract: The adrenal glands and kidneys are unremarkable and  stable. Stomach/Bowel: The stomach, duodenum, small bowel and colon are unremarkable. No inflammatory changes, mass lesions or obstructive findings. The terminal ileum is normal. The appendix is normal. Vascular/Lymphatic: Age advanced atherosclerotic calcifications involving the aorta and iliac arteries but no  aneurysm or dissection. Branch vessel calcifications are also noted. The major venous structures are patent. No mesenteric or retroperitoneal mass or adenopathy. Reproductive: The uterus and ovaries are normal. Other: No pelvic mass or adenopathy. No free pelvic fluid collections. No inguinal mass or adenopathy. Musculoskeletal: No significant bony findings. IMPRESSION: 1. Stable mediastinal and hilar lymph nodes. No findings for new or enlarging lymph nodes. 2. Stable emphysematous changes and pulmonary scarring. Tiny scattered pulmonary nodules no worrisome lesions or new lesions. 3. No findings for abdominal/pelvic metastatic disease. 4. Age advanced aortic and branch vessel atherosclerosis most notably involving the coronary arteries. Electronically Signed   By: Marijo Sanes M.D.   On: 04/05/2016 12:28    ASSESSMENT AND PLAN: This is a very pleasant 55 years old white female with history of recurrent/metastatic non-small cell lung cancer , adenocarcinoma in November 2016 initially diagnosed as stage IB non-small cell lung cancer status post left lower lobectomy with mediastinal lymph node dissection with evidence of pleural invasion and has been on observation. Unfortunately the patient has evidence for disease recurrence and the recent CT scan of the chest showed new small left pleural effusion as well as new pleural based nodules worrisome for transpleural spread of tumor concerning for disease recurrence. The PET scan performed recently shows also numerous new small hypermetabolic soft tissue nodules throughout the left pleural space and small tiny left pleural effusion as well as hypermetabolic mediastinal lymphadenopathy. The patient is currently on treatment with immunotherapy with Ketruda (pembrolizumab) status post 16 cycles. The recent CT scan of the chest, abdomen and pelvis showed no evidence for disease progression. I discussed the scan results with the patient and her daughter today. I  recommended for her to proceed with cycle #17 today as a scheduled. 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  For smoke cessation, I strongly encouraged the patient to quit smoking and offered her to smoke cessation program. I will see the patient back for follow-up visit in 3 weeks for evaluation before starting cycle #18. The patient was advised to call immediately if she has any concerning symptoms in the interval. The patient voices understanding of current disease status and treatment options and is in agreement with the current care plan.  All questions were answered. The patient knows to call the clinic with any problems, questions or concerns. We can certainly see the patient much sooner if necessary.  Disclaimer: This note was dictated with voice recognition software. Similar sounding words can inadvertently be transcribed and may not be corrected upon review.

## 2016-04-12 NOTE — Patient Instructions (Signed)
Smoking Cessation, Tips for Success If you are ready to quit smoking, congratulations! You have chosen to help yourself be healthier. Cigarettes bring nicotine, tar, carbon monoxide, and other irritants into your body. Your lungs, heart, and blood vessels will be able to work better without these poisons. There are many different ways to quit smoking. Nicotine gum, nicotine patches, a nicotine inhaler, or nicotine nasal spray can help with physical craving. Hypnosis, support groups, and medicines help break the habit of smoking. WHAT THINGS CAN I DO TO MAKE QUITTING EASIER?  Here are some tips to help you quit for good:  Pick a date when you will quit smoking completely. Tell all of your friends and family about your plan to quit on that date.  Do not try to slowly cut down on the number of cigarettes you are smoking. Pick a quit date and quit smoking completely starting on that day.  Throw away all cigarettes.   Clean and remove all ashtrays from your home, work, and car.  On a card, write down your reasons for quitting. Carry the card with you and read it when you get the urge to smoke.  Cleanse your body of nicotine. Drink enough water and fluids to keep your urine clear or pale yellow. Do this after quitting to flush the nicotine from your body.  Learn to predict your moods. Do not let a bad situation be your excuse to have a cigarette. Some situations in your life might tempt you into wanting a cigarette.  Never have "just one" cigarette. It leads to wanting another and another. Remind yourself of your decision to quit.  Change habits associated with smoking. If you smoked while driving or when feeling stressed, try other activities to replace smoking. Stand up when drinking your coffee. Brush your teeth after eating. Sit in a different chair when you read the paper. Avoid alcohol while trying to quit, and try to drink fewer caffeinated beverages. Alcohol and caffeine may urge you to  smoke.  Avoid foods and drinks that can trigger a desire to smoke, such as sugary or spicy foods and alcohol.  Ask people who smoke not to smoke around you.  Have something planned to do right after eating or having a cup of coffee. For example, plan to take a walk or exercise.  Try a relaxation exercise to calm you down and decrease your stress. Remember, you may be tense and nervous for the first 2 weeks after you quit, but this will pass.  Find new activities to keep your hands busy. Play with a pen, coin, or rubber band. Doodle or draw things on paper.  Brush your teeth right after eating. This will help cut down on the craving for the taste of tobacco after meals. You can also try mouthwash.   Use oral substitutes in place of cigarettes. Try using lemon drops, carrots, cinnamon sticks, or chewing gum. Keep them handy so they are available when you have the urge to smoke.  When you have the urge to smoke, try deep breathing.  Designate your home as a nonsmoking area.  If you are a heavy smoker, ask your health care provider about a prescription for nicotine chewing gum. It can ease your withdrawal from nicotine.  Reward yourself. Set aside the cigarette money you save and buy yourself something nice.  Look for support from others. Join a support group or smoking cessation program. Ask someone at home or at work to help you with your plan   to quit smoking.  Always ask yourself, "Do I need this cigarette or is this just a reflex?" Tell yourself, "Today, I choose not to smoke," or "I do not want to smoke." You are reminding yourself of your decision to quit.  Do not replace cigarette smoking with electronic cigarettes (commonly called e-cigarettes). The safety of e-cigarettes is unknown, and some may contain harmful chemicals.  If you relapse, do not give up! Plan ahead and think about what you will do the next time you get the urge to smoke. HOW WILL I FEEL WHEN I QUIT SMOKING? You  may have symptoms of withdrawal because your body is used to nicotine (the addictive substance in cigarettes). You may crave cigarettes, be irritable, feel very hungry, cough often, get headaches, or have difficulty concentrating. The withdrawal symptoms are only temporary. They are strongest when you first quit but will go away within 10-14 days. When withdrawal symptoms occur, stay in control. Think about your reasons for quitting. Remind yourself that these are signs that your body is healing and getting used to being without cigarettes. Remember that withdrawal symptoms are easier to treat than the major diseases that smoking can cause.  Even after the withdrawal is over, expect periodic urges to smoke. However, these cravings are generally short lived and will go away whether you smoke or not. Do not smoke! WHAT RESOURCES ARE AVAILABLE TO HELP ME QUIT SMOKING? Your health care provider can direct you to community resources or hospitals for support, which may include:  Group support.  Education.  Hypnosis.  Therapy.   This information is not intended to replace advice given to you by your health care provider. Make sure you discuss any questions you have with your health care provider.   Document Released: 02/12/2004 Document Revised: 06/06/2014 Document Reviewed: 11/01/2012 Elsevier Interactive Patient Education 2016 [employer]  

## 2016-04-12 NOTE — Progress Notes (Signed)
Patient preferred that IV be placed in antecubital. Patient educated on lower vein placement for therapy purposes. Denied and requested antecubital placement. Peripheral IV placed in left antecubital.   Wylene Simmer, BSN, RN 04/12/2016 10:56 AM

## 2016-04-12 NOTE — Patient Instructions (Signed)
[facility] Discharge Instructions for Patients Receiving Chemotherapy  Today you received the following chemotherapy agents: Keytruda   To help prevent nausea and vomiting after your treatment, we encourage you to take your nausea medication as directed    If you develop nausea and vomiting that is not controlled by your nausea medication, call the clinic.   BELOW ARE SYMPTOMS THAT SHOULD BE REPORTED IMMEDIATELY:  *FEVER GREATER THAN 100.5 F  *CHILLS WITH OR WITHOUT FEVER  NAUSEA AND VOMITING THAT IS NOT CONTROLLED WITH YOUR NAUSEA MEDICATION  *UNUSUAL SHORTNESS OF BREATH  *UNUSUAL BRUISING OR BLEEDING  TENDERNESS IN MOUTH AND THROAT WITH OR WITHOUT PRESENCE OF ULCERS  *URINARY PROBLEMS  *BOWEL PROBLEMS  UNUSUAL RASH Items with * indicate a potential emergency and should be followed up as soon as possible.  Feel free to call the clinic you have any questions or concerns. The clinic phone number is [phone].  Please show the CHEMO ALERT CARD at check-in to the Emergency Department and triage nurse.   

## 2016-04-15 ENCOUNTER — Telehealth: Payer: Self-pay | Admitting: *Deleted

## 2016-04-15 NOTE — Telephone Encounter (Signed)
Per LOS I have scheduled appts and notified the scheduler 

## 2016-05-03 ENCOUNTER — Encounter: Payer: Self-pay | Admitting: Internal Medicine

## 2016-05-03 ENCOUNTER — Ambulatory Visit (HOSPITAL_BASED_OUTPATIENT_CLINIC_OR_DEPARTMENT_OTHER): Payer: BLUE CROSS/BLUE SHIELD | Admitting: Internal Medicine

## 2016-05-03 ENCOUNTER — Ambulatory Visit (HOSPITAL_BASED_OUTPATIENT_CLINIC_OR_DEPARTMENT_OTHER): Payer: BLUE CROSS/BLUE SHIELD

## 2016-05-03 ENCOUNTER — Other Ambulatory Visit (HOSPITAL_BASED_OUTPATIENT_CLINIC_OR_DEPARTMENT_OTHER): Payer: BLUE CROSS/BLUE SHIELD

## 2016-05-03 VITALS — BP 131/56 | HR 64 | Temp 98.2°F | Resp 18 | Ht 64.0 in | Wt 138.0 lb

## 2016-05-03 DIAGNOSIS — C3432 Malignant neoplasm of lower lobe, left bronchus or lung: Secondary | ICD-10-CM

## 2016-05-03 DIAGNOSIS — E039 Hypothyroidism, unspecified: Secondary | ICD-10-CM

## 2016-05-03 DIAGNOSIS — Z5112 Encounter for antineoplastic immunotherapy: Secondary | ICD-10-CM

## 2016-05-03 DIAGNOSIS — C3412 Malignant neoplasm of upper lobe, left bronchus or lung: Secondary | ICD-10-CM

## 2016-05-03 LAB — COMPREHENSIVE METABOLIC PANEL
ALT: 11 U/L (ref 0–55)
AST: 19 U/L (ref 5–34)
Albumin: 3.4 g/dL — ABNORMAL LOW (ref 3.5–5.0)
Alkaline Phosphatase: 86 U/L (ref 40–150)
Anion Gap: 10 mEq/L (ref 3–11)
BUN: 6.4 mg/dL — AB (ref 7.0–26.0)
CALCIUM: 9.3 mg/dL (ref 8.4–10.4)
CHLORIDE: 108 meq/L (ref 98–109)
CO2: 22 meq/L (ref 22–29)
CREATININE: 0.7 mg/dL (ref 0.6–1.1)
EGFR: >90 mL/min/{1.73_m2} (ref >90)
Glucose: 92 mg/dl (ref 70–140)
Potassium: 3.6 mEq/L (ref 3.5–5.1)
Sodium: 140 mEq/L (ref 136–145)
Total Bilirubin: 0.42 mg/dL (ref 0.20–1.20)
Total Protein: 7.1 g/dL (ref 6.4–8.3)

## 2016-05-03 LAB — CBC WITH DIFFERENTIAL/PLATELET
BASO%: 1 % (ref 0.0–2.0)
Basophils Absolute: 0.1 10*3/uL (ref 0.0–0.1)
EOS%: 1.5 % (ref 0.0–7.0)
Eosinophils Absolute: 0.1 10*3/uL (ref 0.0–0.5)
HEMATOCRIT: 44.3 % (ref 34.8–46.6)
HGB: 14.9 g/dL (ref 11.6–15.9)
LYMPH#: 2.1 10*3/uL (ref 0.9–3.3)
LYMPH%: 36.3 % (ref 14.0–49.7)
MCH: 32.6 pg (ref 25.1–34.0)
MCHC: 33.7 g/dL (ref 31.5–36.0)
MCV: 96.7 fL (ref 79.5–101.0)
MONO#: 0.4 10*3/uL (ref 0.1–0.9)
MONO%: 6.9 % (ref 0.0–14.0)
NEUT#: 3.2 10*3/uL (ref 1.5–6.5)
NEUT%: 54.3 % (ref 38.4–76.8)
Platelets: 196 10*3/uL (ref 145–400)
RBC: 4.59 10*6/uL (ref 3.70–5.45)
RDW: 14.5 % (ref 11.2–14.5)
WBC: 5.9 10*3/uL (ref 3.9–10.3)

## 2016-05-03 LAB — TSH: TSH: 0.65 m[IU]/L (ref 0.308–3.960)

## 2016-05-03 MED ORDER — SODIUM CHLORIDE 0.9 % IV SOLN
200.0000 mg | Freq: Once | INTRAVENOUS | Status: AC
  Administered 2016-05-03: 200 mg via INTRAVENOUS
  Filled 2016-05-03: qty 8

## 2016-05-03 MED ORDER — SODIUM CHLORIDE 0.9 % IV SOLN
Freq: Once | INTRAVENOUS | Status: AC
Start: 2016-05-03 — End: 2016-05-03
  Administered 2016-05-03: 11:00:00 via INTRAVENOUS

## 2016-05-03 NOTE — Patient Instructions (Signed)
[facility] Discharge Instructions for Patients Receiving Chemotherapy  Today you received the following chemotherapy agents: Keytruda   To help prevent nausea and vomiting after your treatment, we encourage you to take your nausea medication as directed    If you develop nausea and vomiting that is not controlled by your nausea medication, call the clinic.   BELOW ARE SYMPTOMS THAT SHOULD BE REPORTED IMMEDIATELY:  *FEVER GREATER THAN 100.5 F  *CHILLS WITH OR WITHOUT FEVER  NAUSEA AND VOMITING THAT IS NOT CONTROLLED WITH YOUR NAUSEA MEDICATION  *UNUSUAL SHORTNESS OF BREATH  *UNUSUAL BRUISING OR BLEEDING  TENDERNESS IN MOUTH AND THROAT WITH OR WITHOUT PRESENCE OF ULCERS  *URINARY PROBLEMS  *BOWEL PROBLEMS  UNUSUAL RASH Items with * indicate a potential emergency and should be followed up as soon as possible.  Feel free to call the clinic you have any questions or concerns. The clinic phone number is [phone].  Please show the CHEMO ALERT CARD at check-in to the Emergency Department and triage nurse.   

## 2016-05-03 NOTE — Progress Notes (Signed)
[place] [phone]   Fax:([phone]  OFFICE PROGRESS NOTE  [name], NP [address] [place]  [address]  DIAGNOSIS: Recurrent non-small cell lung cancer, adenocarcinoma with PDL 1 expression of 60% diagnosed in November 2016. The patient was initially diagnosed as stage IB (T2a, N0, M0) adenocarcinoma in March 2016.  PRIOR THERAPY:  Status post left video-assisted thoracoscopy, wedge resection left lower lobe, thoracoscopic left lower lobectomy and mediastinal lymph node dissection on 08/14/2014.   CURRENT THERAPY: Treatment with immunotherapy with Ketruda 200 MG IV every 3 weeks. First dose was given 05/12/2015. Status post 17 cycles.  INTERVAL HISTORY: Sharon [name] 55 y.o. female returns to the clinic today for follow-up visit accompanied by her daughter. The patient is feeling well today with no concerning complaints. She feels a little bit cold. She denied having any fatigue or weakness. She works night shift. She denied having any chest pain, shortness of breath, cough or hemoptysis. She denied having any weight loss or night sweats. She is here today for evaluation before starting cycle #18 of her treatment.  MEDICAL HISTORY: Past Medical History:  Diagnosis Date  . Acute ST segment elevation MI ([place])   . Aortic insufficiency    moderate by echo in 2009  . Arthritis   . Cancer (HCC)    LUNGS  . COPD (chronic obstructive pulmonary disease) ([place])   . Coronary artery disease   . Emphysema of lung ([place])   . Hyperlipidemia   . Ischemic cardiomyopathy   . Neoplasm of uncertain behavior of left lower lobe of lung    per CT CHEST/PET 2/4 and 07/18/14 @ [facility]  . Occlusion of right coronary artery ([place])   . Pneumonia    2015   HX BRONCHITIS    . S/P coronary artery stent placement    2009  . Shortness of breath dyspnea    WITH EXEERTION   . Tobacco user     ALLERGIES:  is allergic to codeine; lipitor [atorvastatin calcium];  morphine and related; and oxycodone-acetaminophen.  MEDICATIONS:  Current Outpatient Prescriptions  Medication Sig Dispense Refill  . ALPRAZolam (XANAX) 0.25 MG tablet Take 0.25 mg by mouth daily.  0  . aspirin (ASPIR-LOW) 81 MG EC tablet Take 81 mg by mouth daily. On hold    . clopidogrel (PLAVIX) 75 MG tablet Take 1 tablet (75 mg total) by mouth daily. 90 tablet 3  . levothyroxine (SYNTHROID, LEVOTHROID) 100 MCG tablet Take 1 tablet (100 mcg total) by mouth daily before breakfast. 90 tablet 0  . ondansetron (ZOFRAN) 8 MG tablet TAKE 1 TABLET (8 MG TOTAL) BY MOUTH EVERY 8 (EIGHT) HOURS AS NEEDED FOR NAUSEA OR VOMITING. 20 tablet 0  . potassium chloride (MICRO-K) 10 MEQ CR capsule Take 10 mEq by mouth 2 (two) times daily.  0  . rosuvastatin (CRESTOR) 20 MG tablet Take 20 mg by mouth daily.  0  . traMADol (ULTRAM) 50 MG tablet Take 1 tablet (50 mg total) by mouth every 6 (six) hours as needed. 60 tablet 0   No current facility-administered medications for this visit.     SURGICAL HISTORY:  Past Surgical History:  Procedure Laterality Date  . CARDIAC CATHETERIZATION  10/15/2007   EF 30-40%  . CESAREAN SECTION    . CHOLECYSTECTOMY    . CORONARY ANGIOPLASTY WITH STENT PLACEMENT     LAD  . ectopic pregnancies requiring laparotomies    . LEAD REMOVAL Left 08/14/2014  Procedure: CRYO INTERCOSTAL NERVE BLOCK;  Surgeon: Melrose Nakayama, MD;  Location: [facility];  Service: Thoracic;  Laterality: Left;  . LOBECTOMY Left 08/14/2014   Procedure: LEFT LOWER LOBECTOMY;  Surgeon: Melrose Nakayama, MD;  Location: [facility];  Service: Thoracic;  Laterality: Left;  . NODE DISSECTION Left 08/14/2014   Procedure: NODE DISSECTION, LEFT LOWER LOBE LUNG;  Surgeon: Melrose Nakayama, MD;  Location: [facility];  Service: Thoracic;  Laterality: Left;  . SHOULDER SURGERY     LEFT X 2   (REMOVED SOME COLLAR BONE )  . TRANSTHORACIC ECHOCARDIOGRAM  10/17/2007  . TUBAL LIGATION    . US ECHOCARDIOGRAPHY  01/07/2008    EF 55-60%  . VIDEO ASSISTED THORACOSCOPY (VATS)/WEDGE RESECTION Left 08/14/2014   Procedure: VIDEO ASSISTED THORACOSCOPY (VATS)/WEDGE RESECTION;  Surgeon: Melrose Nakayama, MD;  Location: [facility];  Service: Thoracic;  Laterality: Left;    REVIEW OF SYSTEMS:  A comprehensive review of systems was negative.   PHYSICAL EXAMINATION: General appearance: alert, cooperative and no distress Head: Normocephalic, without obvious abnormality, atraumatic Neck: no adenopathy, no JVD, supple, symmetrical, trachea midline and thyroid not enlarged, symmetric, no tenderness/mass/nodules Lymph nodes: Cervical, supraclavicular, and axillary nodes normal. Resp: clear to auscultation bilaterally Back: negative, symmetric, no curvature. ROM normal. No CVA tenderness. Cardio: regular rate and rhythm, S1, S2 normal, no murmur, click, rub or gallop GI: soft, non-tender; bowel sounds normal; no masses,  no organomegaly Extremities: extremities normal, atraumatic, no cyanosis or edema  ECOG PERFORMANCE STATUS: 1 - Symptomatic but completely ambulatory  Blood pressure (!) 131/56, pulse 64, temperature 98.2 F (36.8 C), temperature source Oral, resp. rate 18, height '5\' 4"'$  (1.626 m), weight 138 lb (62.6 kg), SpO2 98 %.  LABORATORY DATA: Lab Results  Component Value Date   WBC 5.9 05/03/2016   HGB 14.9 05/03/2016   HCT 44.3 05/03/2016   MCV 96.7 05/03/2016   PLT 196 05/03/2016      Chemistry      Component Value Date/Time   NA 142 04/12/2016 0930   K 3.6 04/12/2016 0930   CL 109 08/16/2014 0520   CO2 22 04/12/2016 0930   BUN 10.7 04/12/2016 0930   CREATININE 1.0 04/12/2016 0930      Component Value Date/Time   CALCIUM 9.8 04/12/2016 0930   ALKPHOS 86 04/12/2016 0930   AST 21 04/12/2016 0930   ALT 12 04/12/2016 0930   BILITOT 0.40 04/12/2016 0930       RADIOGRAPHIC STUDIES: Ct Chest W Contrast  Result Date: 04/05/2016 CLINICAL DATA:  Restaging lung cancer. History of left lung resection.  Ongoing chemotherapy. EXAM: CT CHEST, ABDOMEN, AND PELVIS WITH CONTRAST TECHNIQUE: Multidetector CT imaging of the chest, abdomen and pelvis was performed following the standard protocol during bolus administration of intravenous contrast. CONTRAST:  166m ISOVUE-300 IOPAMIDOL (ISOVUE-300) INJECTION 61% COMPARISON:  CT scan 02/04/2016 FINDINGS: CT CHEST FINDINGS Chest wall: No breast masses, supraclavicular or axillary lymphadenopathy. Small scattered lymph nodes are stable. The thyroid gland is grossly normal. Cardiovascular: The heart is normal in size. No pericardial effusion. The aorta is normal in caliber. Stable atherosclerotic calcifications. No dissection. The branch vessels are patent. Stable dense 3 vessel coronary artery calcifications, advanced for age. The pulmonary arteries are grossly normal. Mediastinum/Nodes: Stable scattered mediastinal and hilar lymph nodes. The prevascular lymph node on image number 22 measures 19 x 10 mm and previously measured 24.5 x 11 mm. Pretracheal lymph nodes on image 13 measured maximum 12.5 mm. These are stable.  Small subcarinal nodes are stable. The esophagus is grossly normal. Lungs/Pleura: Stable underlying emphysematous changes and pulmonary scarring. Tiny scattered sub 4 mm pulmonary nodules are unchanged. No new or worrisome pulmonary lesions. No acute pulmonary findings. No pleural effusion. Stable bibasilar scarring changes. Musculoskeletal: No significant bony findings. Stable mild mid thoracic compression deformity. CT ABDOMEN PELVIS FINDINGS Hepatobiliary: No focal hepatic lesions or intrahepatic biliary dilatation. The gallbladder surgically absent. No common bile duct dilatation. Pancreas: No mass, inflammation or duct dilatation. Spleen: Normal size.  No focal lesions. Adrenals/Urinary Tract: The adrenal glands and kidneys are unremarkable and stable. Stomach/Bowel: The stomach, duodenum, small bowel and colon are unremarkable. No inflammatory changes, mass  lesions or obstructive findings. The terminal ileum is normal. The appendix is normal. Vascular/Lymphatic: Age advanced atherosclerotic calcifications involving the aorta and iliac arteries but no aneurysm or dissection. Branch vessel calcifications are also noted. The major venous structures are patent. No mesenteric or retroperitoneal mass or adenopathy. Reproductive: The uterus and ovaries are normal. Other: No pelvic mass or adenopathy. No free pelvic fluid collections. No inguinal mass or adenopathy. Musculoskeletal: No significant bony findings. IMPRESSION: 1. Stable mediastinal and hilar lymph nodes. No findings for new or enlarging lymph nodes. 2. Stable emphysematous changes and pulmonary scarring. Tiny scattered pulmonary nodules no worrisome lesions or new lesions. 3. No findings for abdominal/pelvic metastatic disease. 4. Age advanced aortic and branch vessel atherosclerosis most notably involving the coronary arteries. Electronically Signed   By: Marijo Sanes M.D.   On: 04/05/2016 12:28   Ct Abdomen Pelvis W Contrast  Result Date: 04/05/2016 CLINICAL DATA:  Restaging lung cancer. History of left lung resection. Ongoing chemotherapy. EXAM: CT CHEST, ABDOMEN, AND PELVIS WITH CONTRAST TECHNIQUE: Multidetector CT imaging of the chest, abdomen and pelvis was performed following the standard protocol during bolus administration of intravenous contrast. CONTRAST:  173m ISOVUE-300 IOPAMIDOL (ISOVUE-300) INJECTION 61% COMPARISON:  CT scan 02/04/2016 FINDINGS: CT CHEST FINDINGS Chest wall: No breast masses, supraclavicular or axillary lymphadenopathy. Small scattered lymph nodes are stable. The thyroid gland is grossly normal. Cardiovascular: The heart is normal in size. No pericardial effusion. The aorta is normal in caliber. Stable atherosclerotic calcifications. No dissection. The branch vessels are patent. Stable dense 3 vessel coronary artery calcifications, advanced for age. The pulmonary arteries are  grossly normal. Mediastinum/Nodes: Stable scattered mediastinal and hilar lymph nodes. The prevascular lymph node on image number 22 measures 19 x 10 mm and previously measured 24.5 x 11 mm. Pretracheal lymph nodes on image 13 measured maximum 12.5 mm. These are stable. Small subcarinal nodes are stable. The esophagus is grossly normal. Lungs/Pleura: Stable underlying emphysematous changes and pulmonary scarring. Tiny scattered sub 4 mm pulmonary nodules are unchanged. No new or worrisome pulmonary lesions. No acute pulmonary findings. No pleural effusion. Stable bibasilar scarring changes. Musculoskeletal: No significant bony findings. Stable mild mid thoracic compression deformity. CT ABDOMEN PELVIS FINDINGS Hepatobiliary: No focal hepatic lesions or intrahepatic biliary dilatation. The gallbladder surgically absent. No common bile duct dilatation. Pancreas: No mass, inflammation or duct dilatation. Spleen: Normal size.  No focal lesions. Adrenals/Urinary Tract: The adrenal glands and kidneys are unremarkable and stable. Stomach/Bowel: The stomach, duodenum, small bowel and colon are unremarkable. No inflammatory changes, mass lesions or obstructive findings. The terminal ileum is normal. The appendix is normal. Vascular/Lymphatic: Age advanced atherosclerotic calcifications involving the aorta and iliac arteries but no aneurysm or dissection. Branch vessel calcifications are also noted. The major venous structures are patent. No mesenteric or retroperitoneal mass or  adenopathy. Reproductive: The uterus and ovaries are normal. Other: No pelvic mass or adenopathy. No free pelvic fluid collections. No inguinal mass or adenopathy. Musculoskeletal: No significant bony findings. IMPRESSION: 1. Stable mediastinal and hilar lymph nodes. No findings for new or enlarging lymph nodes. 2. Stable emphysematous changes and pulmonary scarring. Tiny scattered pulmonary nodules no worrisome lesions or new lesions. 3. No findings  for abdominal/pelvic metastatic disease. 4. Age advanced aortic and branch vessel atherosclerosis most notably involving the coronary arteries. Electronically Signed   By: Marijo Sanes M.D.   On: 04/05/2016 12:28    ASSESSMENT AND PLAN: This is a very pleasant 55 years old white female with recurrent non-small cell lung cancer, adenocarcinoma with PDL 1 expression of 60%. The patient is currently on treatment with [place] every 3 weeks. She is status post 17 cycles. She is tolerating the treatment well with no significant adverse effects. I recommended for her to proceed with cycle #18 today as a scheduled. She will come back for follow-up visit in 3 weeks for evaluation before starting cycle #19. For the hypothyroidism, she will continue her current treatment with levothyroxine. The patient was advised to call immediately if she has any concerning symptoms in the interval. The patient voices understanding of current disease status and treatment options and is in agreement with the current care plan.  All questions were answered. The patient knows to call the clinic with any problems, questions or concerns. We can certainly see the patient much sooner if necessary.  Disclaimer: This note was dictated with voice recognition software. Similar sounding words can inadvertently be transcribed and may not be corrected upon review.

## 2016-05-24 ENCOUNTER — Other Ambulatory Visit: Payer: Self-pay | Admitting: Emergency Medicine

## 2016-05-24 ENCOUNTER — Ambulatory Visit (HOSPITAL_BASED_OUTPATIENT_CLINIC_OR_DEPARTMENT_OTHER): Payer: BLUE CROSS/BLUE SHIELD | Admitting: Internal Medicine

## 2016-05-24 ENCOUNTER — Other Ambulatory Visit (HOSPITAL_BASED_OUTPATIENT_CLINIC_OR_DEPARTMENT_OTHER): Payer: BLUE CROSS/BLUE SHIELD

## 2016-05-24 ENCOUNTER — Encounter: Payer: Self-pay | Admitting: Internal Medicine

## 2016-05-24 ENCOUNTER — Ambulatory Visit (HOSPITAL_BASED_OUTPATIENT_CLINIC_OR_DEPARTMENT_OTHER): Payer: BLUE CROSS/BLUE SHIELD

## 2016-05-24 VITALS — BP 137/58 | HR 70 | Temp 98.0°F | Resp 18 | Ht 64.0 in | Wt 136.2 lb

## 2016-05-24 DIAGNOSIS — Z79899 Other long term (current) drug therapy: Secondary | ICD-10-CM | POA: Diagnosis not present

## 2016-05-24 DIAGNOSIS — Z5112 Encounter for antineoplastic immunotherapy: Secondary | ICD-10-CM | POA: Diagnosis not present

## 2016-05-24 DIAGNOSIS — C3412 Malignant neoplasm of upper lobe, left bronchus or lung: Secondary | ICD-10-CM

## 2016-05-24 DIAGNOSIS — E039 Hypothyroidism, unspecified: Secondary | ICD-10-CM

## 2016-05-24 DIAGNOSIS — C3432 Malignant neoplasm of lower lobe, left bronchus or lung: Secondary | ICD-10-CM | POA: Diagnosis not present

## 2016-05-24 LAB — COMPREHENSIVE METABOLIC PANEL
ALBUMIN: 3.5 g/dL (ref 3.5–5.0)
ALK PHOS: 88 U/L (ref 40–150)
ALT: 14 U/L (ref 0–55)
AST: 22 U/L (ref 5–34)
Anion Gap: 8 mEq/L (ref 3–11)
BILIRUBIN TOTAL: 0.62 mg/dL (ref 0.20–1.20)
BUN: 8.9 mg/dL (ref 7.0–26.0)
CALCIUM: 9.6 mg/dL (ref 8.4–10.4)
CO2: 22 mEq/L (ref 22–29)
CREATININE: 0.8 mg/dL (ref 0.6–1.1)
Chloride: 108 mEq/L (ref 98–109)
EGFR: 84 mL/min/{1.73_m2} — ABNORMAL LOW (ref >90)
GLUCOSE: 93 mg/dL (ref 70–140)
Potassium: 4.2 mEq/L (ref 3.5–5.1)
SODIUM: 138 meq/L (ref 136–145)
TOTAL PROTEIN: 7.5 g/dL (ref 6.4–8.3)

## 2016-05-24 LAB — CBC WITH DIFFERENTIAL/PLATELET
BASO%: 0.7 % (ref 0.0–2.0)
BASOS ABS: 0 10*3/uL (ref 0.0–0.1)
EOS ABS: 0.1 10*3/uL (ref 0.0–0.5)
EOS%: 2.1 % (ref 0.0–7.0)
HEMATOCRIT: 45.4 % (ref 34.8–46.6)
HEMOGLOBIN: 15.8 g/dL (ref 11.6–15.9)
LYMPH#: 1.7 10*3/uL (ref 0.9–3.3)
LYMPH%: 25.8 % (ref 14.0–49.7)
MCH: 33.3 pg (ref 25.1–34.0)
MCHC: 34.7 g/dL (ref 31.5–36.0)
MCV: 95.9 fL (ref 79.5–101.0)
MONO#: 0.5 10*3/uL (ref 0.1–0.9)
MONO%: 8 % (ref 0.0–14.0)
NEUT#: 4.2 10*3/uL (ref 1.5–6.5)
NEUT%: 63.4 % (ref 38.4–76.8)
Platelets: 210 10*3/uL (ref 145–400)
RBC: 4.74 10*6/uL (ref 3.70–5.45)
RDW: 14.1 % (ref 11.2–14.5)
WBC: 6.7 10*3/uL (ref 3.9–10.3)

## 2016-05-24 LAB — TSH: TSH: 8.902 m[IU]/L — AB (ref 0.308–3.960)

## 2016-05-24 MED ORDER — SODIUM CHLORIDE 0.9 % IV SOLN
200.0000 mg | Freq: Once | INTRAVENOUS | Status: AC
  Administered 2016-05-24: 200 mg via INTRAVENOUS
  Filled 2016-05-24: qty 8

## 2016-05-24 MED ORDER — SODIUM CHLORIDE 0.9 % IV SOLN
Freq: Once | INTRAVENOUS | Status: AC
  Administered 2016-05-24: 12:00:00 via INTRAVENOUS

## 2016-05-24 MED ORDER — ONDANSETRON HCL 8 MG PO TABS: ORAL_TABLET | ORAL | 0 refills | Status: DC

## 2016-05-24 NOTE — Patient Instructions (Signed)
[facility] Discharge Instructions for Patients Receiving Chemotherapy  Today you received the following chemotherapy agents keytruda   To help prevent nausea and vomiting after your treatment, we encourage you to take your nausea medication as directed  If you develop nausea and vomiting that is not controlled by your nausea medication, call the clinic.   BELOW ARE SYMPTOMS THAT SHOULD BE REPORTED IMMEDIATELY:  *FEVER GREATER THAN 100.5 F  *CHILLS WITH OR WITHOUT FEVER  NAUSEA AND VOMITING THAT IS NOT CONTROLLED WITH YOUR NAUSEA MEDICATION  *UNUSUAL SHORTNESS OF BREATH  *UNUSUAL BRUISING OR BLEEDING  TENDERNESS IN MOUTH AND THROAT WITH OR WITHOUT PRESENCE OF ULCERS  *URINARY PROBLEMS  *BOWEL PROBLEMS  UNUSUAL RASH Items with * indicate a potential emergency and should be followed up as soon as possible.  Feel free to call the clinic you have any questions or concerns. The clinic phone number is [phone].  

## 2016-05-24 NOTE — Progress Notes (Signed)
[name] Telephone:([phone]   Fax:([phone]  OFFICE PROGRESS NOTE  [name], NP [address] [place] [address]  DIAGNOSIS: Recurrent non-small cell lung cancer, adenocarcinoma with PDL 1 expression of 60% diagnosed in November 2016. The patient was initially diagnosed as stage IB (T2a, N0, M0) adenocarcinoma in March 2016.  PRIOR THERAPY:  Status post left video-assisted thoracoscopy, wedge resection left lower lobe, thoracoscopic left lower lobectomy and mediastinal lymph node dissection on 08/14/2014.   CURRENT THERAPY: Treatment with immunotherapy with Ketruda 200 MG IV every 3 weeks. First dose was given 05/12/2015. Status post 18 cycles.  INTERVAL HISTORY: Sharon [name] 55 y.o. female came to the clinic today for follow-up visit accompanied by her daughter. The patient is feeling fine today was no specific complaints except for mild sinusitis. She has no fever or chills. She denied having any chest pain, shortness of breath, cough or hemoptysis. She has no nausea or vomiting. She denied having any significant weight loss or night sweats. She is here today for evaluation before starting cycle #19 of her immunotherapy.  MEDICAL HISTORY: Past Medical History:  Diagnosis Date  . Acute ST segment elevation MI ([place])   . Aortic insufficiency    moderate by echo in 2009  . Arthritis   . Cancer (HCC)    LUNGS  . COPD (chronic obstructive pulmonary disease) ([place])   . Coronary artery disease   . Emphysema of lung ([place])   . Hyperlipidemia   . Ischemic cardiomyopathy   . Neoplasm of uncertain behavior of left lower lobe of lung    per CT CHEST/PET 2/4 and 07/18/14 @ [facility]  . Occlusion of right coronary artery ([place])   . Pneumonia    2015   HX BRONCHITIS    . S/P coronary artery stent placement    2009  . Shortness of breath dyspnea    WITH EXEERTION   . Tobacco user     ALLERGIES:  is allergic to codeine; lipitor [atorvastatin calcium];  morphine and related; and oxycodone-acetaminophen.  MEDICATIONS:  Current Outpatient Prescriptions  Medication Sig Dispense Refill  . ALPRAZolam (XANAX) 0.25 MG tablet Take 0.25 mg by mouth daily.  0  . aspirin (ASPIR-LOW) 81 MG EC tablet Take 81 mg by mouth daily. On hold    . clopidogrel (PLAVIX) 75 MG tablet Take 1 tablet (75 mg total) by mouth daily. 90 tablet 3  . levothyroxine (SYNTHROID, LEVOTHROID) 100 MCG tablet Take 1 tablet (100 mcg total) by mouth daily before breakfast. 90 tablet 0  . ondansetron (ZOFRAN) 8 MG tablet TAKE 1 TABLET (8 MG TOTAL) BY MOUTH EVERY 8 (EIGHT) HOURS AS NEEDED FOR NAUSEA OR VOMITING. 20 tablet 0  . potassium chloride (MICRO-K) 10 MEQ CR capsule Take 10 mEq by mouth 2 (two) times daily.  0  . rosuvastatin (CRESTOR) 20 MG tablet Take 20 mg by mouth daily.  0  . traMADol (ULTRAM) 50 MG tablet Take 1 tablet (50 mg total) by mouth every 6 (six) hours as needed. 60 tablet 0   No current facility-administered medications for this visit.     SURGICAL HISTORY:  Past Surgical History:  Procedure Laterality Date  . CARDIAC CATHETERIZATION  10/15/2007   EF 30-40%  . CESAREAN SECTION    . CHOLECYSTECTOMY    . CORONARY ANGIOPLASTY WITH STENT PLACEMENT     LAD  . ectopic pregnancies requiring laparotomies    . LEAD REMOVAL Left 08/14/2014  Procedure: CRYO INTERCOSTAL NERVE BLOCK;  Surgeon: Melrose Nakayama, MD;  Location: [facility];  Service: Thoracic;  Laterality: Left;  . LOBECTOMY Left 08/14/2014   Procedure: LEFT LOWER LOBECTOMY;  Surgeon: Melrose Nakayama, MD;  Location: [facility];  Service: Thoracic;  Laterality: Left;  . NODE DISSECTION Left 08/14/2014   Procedure: NODE DISSECTION, LEFT LOWER LOBE LUNG;  Surgeon: Melrose Nakayama, MD;  Location: [facility];  Service: Thoracic;  Laterality: Left;  . SHOULDER SURGERY     LEFT X 2   (REMOVED SOME COLLAR BONE )  . TRANSTHORACIC ECHOCARDIOGRAM  10/17/2007  . TUBAL LIGATION    . US ECHOCARDIOGRAPHY  01/07/2008    EF 55-60%  . VIDEO ASSISTED THORACOSCOPY (VATS)/WEDGE RESECTION Left 08/14/2014   Procedure: VIDEO ASSISTED THORACOSCOPY (VATS)/WEDGE RESECTION;  Surgeon: Melrose Nakayama, MD;  Location: [facility];  Service: Thoracic;  Laterality: Left;    REVIEW OF SYSTEMS:  A comprehensive review of systems was negative except for: Ears, nose, mouth, throat, and face: positive for Mild sinusitis   PHYSICAL EXAMINATION: General appearance: alert, cooperative and no distress Head: Normocephalic, without obvious abnormality, atraumatic Neck: no adenopathy, no JVD, supple, symmetrical, trachea midline and thyroid not enlarged, symmetric, no tenderness/mass/nodules Lymph nodes: Cervical, supraclavicular, and axillary nodes normal. Resp: clear to auscultation bilaterally Back: negative, symmetric, no curvature. ROM normal. No CVA tenderness. Cardio: regular rate and rhythm, S1, S2 normal, no murmur, click, rub or gallop GI: soft, non-tender; bowel sounds normal; no masses,  no organomegaly Extremities: extremities normal, atraumatic, no cyanosis or edema  ECOG PERFORMANCE STATUS: 1 - Symptomatic but completely ambulatory  Blood pressure (!) 137/58, pulse 70, temperature 98 F (36.7 C), temperature source Oral, resp. rate 18, height '5\' 4"'$  (1.626 m), weight 136 lb 3.2 oz (61.8 kg), SpO2 98 %.  LABORATORY DATA: Lab Results  Component Value Date   WBC 6.7 05/24/2016   HGB 15.8 05/24/2016   HCT 45.4 05/24/2016   MCV 95.9 05/24/2016   PLT 210 05/24/2016      Chemistry      Component Value Date/Time   NA 140 05/03/2016 0925   K 3.6 05/03/2016 0925   CL 109 08/16/2014 0520   CO2 22 05/03/2016 0925   BUN 6.4 (L) 05/03/2016 0925   CREATININE 0.7 05/03/2016 0925      Component Value Date/Time   CALCIUM 9.3 05/03/2016 0925   ALKPHOS 86 05/03/2016 0925   AST 19 05/03/2016 0925   ALT 11 05/03/2016 0925   BILITOT 0.42 05/03/2016 0925       RADIOGRAPHIC STUDIES: No results found.  ASSESSMENT AND  PLAN: This is a very pleasant 55 years old white female with recurrent non-small cell lung cancer, adenocarcinoma with PDL 1 expression of 60% . The patient is currently undergoing treatment with [place] status post 18 cycles.. She is tolerating the treatment well with no concerning complaints. I recommended for the patient to proceed with cycle #19 today as a scheduled. She will come back for follow-up visit in 3 weeks for evaluation before starting cycle #20. She was advised to call immediately if she has any concerning symptoms in the interval. For the mild sinusitis, I will continue to monitor it closely and consider treating the patient with antibiotics if she develop any fever or worsening of her condition. The patient voices understanding of current disease status and treatment options and is in agreement with the current care plan.  All questions were answered. The patient knows to call the  clinic with any problems, questions or concerns. We can certainly see the patient much sooner if necessary. I spent 10 minutes counseling the patient face to face. The total time spent in the appointment was 15 minutes. Disclaimer: This note was dictated with voice recognition software. Similar sounding words can inadvertently be transcribed and may not be corrected upon review.

## 2016-06-14 ENCOUNTER — Ambulatory Visit (HOSPITAL_BASED_OUTPATIENT_CLINIC_OR_DEPARTMENT_OTHER): Payer: BLUE CROSS/BLUE SHIELD

## 2016-06-14 ENCOUNTER — Ambulatory Visit (HOSPITAL_BASED_OUTPATIENT_CLINIC_OR_DEPARTMENT_OTHER): Payer: BLUE CROSS/BLUE SHIELD | Admitting: Internal Medicine

## 2016-06-14 ENCOUNTER — Encounter: Payer: Self-pay | Admitting: Internal Medicine

## 2016-06-14 ENCOUNTER — Other Ambulatory Visit: Payer: Self-pay | Admitting: Internal Medicine

## 2016-06-14 ENCOUNTER — Other Ambulatory Visit (HOSPITAL_BASED_OUTPATIENT_CLINIC_OR_DEPARTMENT_OTHER): Payer: BLUE CROSS/BLUE SHIELD

## 2016-06-14 VITALS — BP 158/68 | HR 64 | Temp 97.6°F | Resp 18 | Ht 64.0 in | Wt 143.1 lb

## 2016-06-14 DIAGNOSIS — R5383 Other fatigue: Secondary | ICD-10-CM

## 2016-06-14 DIAGNOSIS — E039 Hypothyroidism, unspecified: Secondary | ICD-10-CM

## 2016-06-14 DIAGNOSIS — Z79899 Other long term (current) drug therapy: Secondary | ICD-10-CM | POA: Diagnosis not present

## 2016-06-14 DIAGNOSIS — Z5112 Encounter for antineoplastic immunotherapy: Secondary | ICD-10-CM | POA: Diagnosis not present

## 2016-06-14 DIAGNOSIS — C3432 Malignant neoplasm of lower lobe, left bronchus or lung: Secondary | ICD-10-CM

## 2016-06-14 DIAGNOSIS — C3412 Malignant neoplasm of upper lobe, left bronchus or lung: Secondary | ICD-10-CM

## 2016-06-14 DIAGNOSIS — I10 Essential (primary) hypertension: Secondary | ICD-10-CM | POA: Diagnosis not present

## 2016-06-14 LAB — CBC WITH DIFFERENTIAL/PLATELET
BASO%: 0.4 % (ref 0.0–2.0)
BASOS ABS: 0 10*3/uL (ref 0.0–0.1)
EOS%: 1.4 % (ref 0.0–7.0)
Eosinophils Absolute: 0.1 10*3/uL (ref 0.0–0.5)
HCT: 46.8 % — ABNORMAL HIGH (ref 34.8–46.6)
HGB: 15.8 g/dL (ref 11.6–15.9)
LYMPH%: 25.9 % (ref 14.0–49.7)
MCH: 32.9 pg (ref 25.1–34.0)
MCHC: 33.8 g/dL (ref 31.5–36.0)
MCV: 97.5 fL (ref 79.5–101.0)
MONO#: 0.4 10*3/uL (ref 0.1–0.9)
MONO%: 3.8 % (ref 0.0–14.0)
NEUT#: 7 10*3/uL — ABNORMAL HIGH (ref 1.5–6.5)
NEUT%: 68.5 % (ref 38.4–76.8)
Platelets: 205 10*3/uL (ref 145–400)
RBC: 4.8 10*6/uL (ref 3.70–5.45)
RDW: 14.6 % — ABNORMAL HIGH (ref 11.2–14.5)
WBC: 10.2 10*3/uL (ref 3.9–10.3)
lymph#: 2.6 10*3/uL (ref 0.9–3.3)
nRBC: 0 % (ref 0–0)

## 2016-06-14 LAB — COMPREHENSIVE METABOLIC PANEL
ALT: 11 U/L (ref 0–55)
AST: 22 U/L (ref 5–34)
Albumin: 3.7 g/dL (ref 3.5–5.0)
Alkaline Phosphatase: 90 U/L (ref 40–150)
Anion Gap: 11 mEq/L (ref 3–11)
BILIRUBIN TOTAL: 0.48 mg/dL (ref 0.20–1.20)
BUN: 10 mg/dL (ref 7.0–26.0)
CALCIUM: 9.8 mg/dL (ref 8.4–10.4)
CHLORIDE: 106 meq/L (ref 98–109)
CO2: 22 meq/L (ref 22–29)
CREATININE: 0.9 mg/dL (ref 0.6–1.1)
EGFR: 74 mL/min/{1.73_m2} — ABNORMAL LOW (ref >90)
Glucose: 107 mg/dl (ref 70–140)
Potassium: 3.5 mEq/L (ref 3.5–5.1)
Sodium: 139 mEq/L (ref 136–145)
TOTAL PROTEIN: 7.7 g/dL (ref 6.4–8.3)

## 2016-06-14 LAB — TSH: TSH: 34.239 m(IU)/L — ABNORMAL HIGH (ref 0.308–3.960)

## 2016-06-14 MED ORDER — SODIUM CHLORIDE 0.9 % IV SOLN
200.0000 mg | Freq: Once | INTRAVENOUS | Status: AC
  Administered 2016-06-14: 200 mg via INTRAVENOUS
  Filled 2016-06-14: qty 8

## 2016-06-14 MED ORDER — LEVOTHYROXINE SODIUM 125 MCG PO TABS: 125.0000 ug | ORAL_TABLET | Freq: Every day | ORAL | 2 refills | Status: DC

## 2016-06-14 MED ORDER — SODIUM CHLORIDE 0.9 % IV SOLN
Freq: Once | INTRAVENOUS | Status: AC
  Administered 2016-06-14: 11:00:00 via INTRAVENOUS

## 2016-06-14 NOTE — Patient Instructions (Signed)
Steps to Quit Smoking Smoking tobacco can be bad for your health. It can also affect almost every organ in your body. Smoking puts you and people around you at risk for many serious long-lasting (chronic) diseases. Quitting smoking is hard, but it is one of the best things that you can do for your health. It is never too late to quit. What are the benefits of quitting smoking? When you quit smoking, you lower your risk for getting serious diseases and conditions. They can include:  Lung cancer or lung disease.  Heart disease.  Stroke.  Heart attack.  Not being able to have children (infertility).  Weak bones (osteoporosis) and broken bones (fractures). If you have coughing, wheezing, and shortness of breath, those symptoms may get better when you quit. You may also get sick less often. If you are pregnant, quitting smoking can help to lower your chances of having a baby of low birth weight. What can I do to help me quit smoking? Talk with your doctor about what can help you quit smoking. Some things you can do (strategies) include:  Quitting smoking totally, instead of slowly cutting back how much you smoke over a period of time.  Going to in-person counseling. You are more likely to quit if you go to many counseling sessions.  Using resources and support systems, such as:  Online chats with a counselor.  Phone quitlines.  Printed self-help materials.  Support groups or group counseling.  Text messaging programs.  Mobile phone apps or applications.  Taking medicines. Some of these medicines may have nicotine in them. If you are pregnant or breastfeeding, do not take any medicines to quit smoking unless your doctor says it is okay. Talk with your doctor about counseling or other things that can help you. Talk with your doctor about using more than one strategy at the same time, such as taking medicines while you are also going to in-person counseling. This can help make quitting  easier. What things can I do to make it easier to quit? Quitting smoking might feel very hard at first, but there is a lot that you can do to make it easier. Take these steps:  Talk to your family and friends. Ask them to support and encourage you.  Call phone quitlines, reach out to support groups, or work with a counselor.  Ask people who smoke to not smoke around you.  Avoid places that make you want (trigger) to smoke, such as:  Bars.  Parties.  Smoke-break areas at work.  Spend time with people who do not smoke.  Lower the stress in your life. Stress can make you want to smoke. Try these things to help your stress:  Getting regular exercise.  Deep-breathing exercises.  Yoga.  Meditating.  Doing a body scan. To do this, close your eyes, focus on one area of your body at a time from head to toe, and notice which parts of your body are tense. Try to relax the muscles in those areas.  Download or buy apps on your mobile phone or tablet that can help you stick to your quit plan. There are many free apps, such as QuitGuide from the CDC (Centers for Disease Control and Prevention). You can find more support from smokefree.gov and other websites. This information is not intended to replace advice given to you by your health care provider. Make sure you discuss any questions you have with your health care provider. Document Released: 03/12/2009 Document Revised: 01/12/2016 Document   Reviewed: 09/30/2014 Elsevier Interactive Patient Education  2017 [employer] Inc.  

## 2016-06-14 NOTE — Patient Instructions (Signed)
[facility] Discharge Instructions for Patients Receiving Chemotherapy  Today you received the following chemotherapy agents keytruda   To help prevent nausea and vomiting after your treatment, we encourage you to take your nausea medication as directed  If you develop nausea and vomiting that is not controlled by your nausea medication, call the clinic.   BELOW ARE SYMPTOMS THAT SHOULD BE REPORTED IMMEDIATELY:  *FEVER GREATER THAN 100.5 F  *CHILLS WITH OR WITHOUT FEVER  NAUSEA AND VOMITING THAT IS NOT CONTROLLED WITH YOUR NAUSEA MEDICATION  *UNUSUAL SHORTNESS OF BREATH  *UNUSUAL BRUISING OR BLEEDING  TENDERNESS IN MOUTH AND THROAT WITH OR WITHOUT PRESENCE OF ULCERS  *URINARY PROBLEMS  *BOWEL PROBLEMS  UNUSUAL RASH Items with * indicate a potential emergency and should be followed up as soon as possible.  Feel free to call the clinic you have any questions or concerns. The clinic phone number is [phone].  

## 2016-06-14 NOTE — Progress Notes (Signed)
[facility] [phone]   [phone]  OFFICE PROGRESS NOTE  [name], NP [address] [place] [address]  DIAGNOSIS: Recurrent non-small cell lung cancer, adenocarcinoma with PDL 1 expression of 60% diagnosed in November 2016. The patient was initially diagnosed as stage IB (T2a, N0, M0) adenocarcinoma in March 2016.  PRIOR THERAPY:  Status post left video-assisted thoracoscopy, wedge resection left lower lobe, thoracoscopic left lower lobectomy and mediastinal lymph node dissection on 08/14/2014.   CURRENT THERAPY: Treatment with immunotherapy with Ketruda 200 MG IV every 3 weeks. First dose was given 05/12/2015. Status post 19 cycles.  INTERVAL HISTORY: Sharon [name] 56 y.o. female came to the clinic today accompanied by her daughter. The patient is feeling fine today except for fatigue and feeling cold. She has been tolerating her treatment with Nat Math (pembrolizumab) fairly well. She gained around 7 pounds since her last visit. Her blood pressure is elevated today but she is not on any blood pressure medication. She denied having any chest pain, shortness of breath, cough or hemoptysis. She has no fever or chills. She is here today for evaluation before starting cycle #20 of her treatment.  MEDICAL HISTORY: Past Medical History:  Diagnosis Date  . Acute ST segment elevation MI ([place])   . Aortic insufficiency    moderate by echo in 2009  . Arthritis   . Cancer (HCC)    LUNGS  . COPD (chronic obstructive pulmonary disease) ([place])   . Coronary artery disease   . Emphysema of lung ([place])   . Hyperlipidemia   . Ischemic cardiomyopathy   . 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 Occlusion of right coronary artery ([place])   . Pneumonia    2015   HX BRONCHITIS    . S/P coronary artery stent placement    2009  . Shortness of breath dyspnea    WITH EXEERTION   . Tobacco  user     ALLERGIES:  is allergic to codeine; lipitor [atorvastatin calcium]; morphine and related; and oxycodone-acetaminophen.  MEDICATIONS:  Current Outpatient Prescriptions  Medication Sig Dispense Refill  . ALPRAZolam (XANAX) 0.25 MG tablet Take 0.25 mg by mouth daily.  0  . aspirin (ASPIR-LOW) 81 MG EC tablet Take 81 mg by mouth daily. On hold    . clopidogrel (PLAVIX) 75 MG tablet Take 1 tablet (75 mg total) by mouth daily. 90 tablet 3  . guaiFENesin (MUCINEX) 600 MG 12 hr tablet Take 600 mg by mouth 2 (two) times daily.    Marland Kitchen levothyroxine (SYNTHROID, LEVOTHROID) 100 MCG tablet Take 1 tablet (100 mcg total) by mouth daily before breakfast. 90 tablet 0  . potassium chloride (MICRO-K) 10 MEQ CR capsule Take 10 mEq by mouth 2 (two) times daily.  0  . rosuvastatin (CRESTOR) 20 MG tablet Take 20 mg by mouth daily.  0  . ondansetron (ZOFRAN) 8 MG tablet TAKE 1 TABLET (8 MG TOTAL) BY MOUTH EVERY 8 (EIGHT) HOURS AS NEEDED FOR NAUSEA OR VOMITING. (Patient not taking: Reported on 06/14/2016) 30 tablet 0  . traMADol (ULTRAM) 50 MG tablet Take 1 tablet (50 mg total) by mouth every 6 (six) hours as needed. (Patient not taking: Reported on 06/14/2016) 60 tablet 0   No current facility-administered medications for this visit.     SURGICAL HISTORY:  Past Surgical History:  Procedure Laterality Date  .  CARDIAC CATHETERIZATION  10/15/2007   EF 30-40%  . CESAREAN SECTION    . CHOLECYSTECTOMY    . CORONARY ANGIOPLASTY WITH STENT PLACEMENT     LAD  . ectopic pregnancies requiring laparotomies    . LEAD REMOVAL Left 08/14/2014   Procedure: CRYO INTERCOSTAL NERVE BLOCK;  Surgeon: Melrose Nakayama, MD;  Location: [facility];  Service: Thoracic;  Laterality: Left;  . LOBECTOMY Left 08/14/2014   Procedure: LEFT LOWER LOBECTOMY;  Surgeon: Melrose Nakayama, MD;  Location: [facility];  Service: Thoracic;  Laterality: Left;  . NODE DISSECTION Left 08/14/2014   Procedure: NODE DISSECTION, LEFT LOWER LOBE LUNG;   Surgeon: Melrose Nakayama, MD;  Location: [facility];  Service: Thoracic;  Laterality: Left;  . SHOULDER SURGERY     LEFT X 2   (REMOVED SOME COLLAR BONE )  . TRANSTHORACIC ECHOCARDIOGRAM  10/17/2007  . TUBAL LIGATION    . US ECHOCARDIOGRAPHY  01/07/2008   EF 55-60%  . VIDEO ASSISTED THORACOSCOPY (VATS)/WEDGE RESECTION Left 08/14/2014   Procedure: VIDEO ASSISTED THORACOSCOPY (VATS)/WEDGE RESECTION;  Surgeon: Melrose Nakayama, MD;  Location: [facility];  Service: Thoracic;  Laterality: Left;    REVIEW OF SYSTEMS:  A comprehensive review of systems was negative except for: Constitutional: positive for fatigue   PHYSICAL EXAMINATION: General appearance: alert, cooperative, fatigued and no distress Head: Normocephalic, without obvious abnormality, atraumatic Neck: no adenopathy, no JVD, supple, symmetrical, trachea midline and thyroid not enlarged, symmetric, no tenderness/mass/nodules Lymph nodes: Cervical, supraclavicular, and axillary nodes normal. Resp: clear to auscultation bilaterally Back: negative, no kyphosis present, symmetric, no curvature. ROM normal. No CVA tenderness. Cardio: regular rate and rhythm, S1, S2 normal, no murmur, click, rub or gallop GI: soft, non-tender; bowel sounds normal; no masses,  no organomegaly Extremities: extremities normal, atraumatic, no cyanosis or edema  ECOG PERFORMANCE STATUS: 1 - Symptomatic but completely ambulatory  Blood pressure (!) 158/68, pulse 64, temperature 97.6 F (36.4 C), temperature source Oral, resp. rate 18, height '5\' 4"'$  (1.626 m), weight 143 lb 1.6 oz (64.9 kg), SpO2 98 %.  LABORATORY DATA: Lab Results  Component Value Date   WBC 10.2 06/14/2016   HGB 15.8 06/14/2016   HCT 46.8 (H) 06/14/2016   MCV 97.5 06/14/2016   PLT 205 06/14/2016      Chemistry      Component Value Date/Time   NA 138 05/24/2016 1038   K 4.2 05/24/2016 1038   CL 109 08/16/2014 0520   CO2 22 05/24/2016 1038   BUN 8.9 05/24/2016 1038   CREATININE 0.8  05/24/2016 1038      Component Value Date/Time   CALCIUM 9.6 05/24/2016 1038   ALKPHOS 88 05/24/2016 1038   AST 22 05/24/2016 1038   ALT 14 05/24/2016 1038   BILITOT 0.62 05/24/2016 1038       RADIOGRAPHIC STUDIES: No results found.  ASSESSMENT AND PLAN:  This is a very pleasant 56 years old white female with recurrent non-small cell lung cancer, adenocarcinoma with PDL 1 expression 60%. The patient is currently undergoing treatment with Ketruda (pembrolizumab) 200 MG IV every 3 weeks status post 19 cycles. She was treating her treatment well except for fatigue. I recommended for her to proceed with cycle #20 today as scheduled. She would come back for follow-up visit in 3 weeks for reevaluation with repeat CT scan of the chest, abdomen and pelvis for restaging of her disease before cycle #21. For the hypertension, I advised the patient to monitor her blood pressure  can get home and stay elevated to consult with her primary care physician regarding treatment for her condition. The patient was advised to call immediately if she has any concerning symptoms in the interval. The patient voices understanding of current disease status and treatment options and is in agreement with the current care plan.  All questions were answered. The patient knows to call the clinic with any problems, questions or concerns. We can certainly see the patient much sooner if necessary. I spent 10 minutes counseling the patient face to face. The total time spent in the appointment was 15 minutes.  Disclaimer: This note was dictated with voice recognition software. Similar sounding words can inadvertently be transcribed and may not be corrected upon review.

## 2016-06-15 ENCOUNTER — Telehealth: Payer: Self-pay | Admitting: Medical Oncology

## 2016-06-15 NOTE — Telephone Encounter (Signed)
I left message that synthroid dose changed

## 2016-06-28 ENCOUNTER — Ambulatory Visit (HOSPITAL_COMMUNITY)
Admission: RE | Admit: 2016-06-28 | Discharge: 2016-06-28 | Disposition: A | Discharge status: Home / Self Care | Payer: BLUE CROSS/BLUE SHIELD | Source: Ambulatory Visit | Attending: Internal Medicine | Admitting: Internal Medicine

## 2016-06-28 ENCOUNTER — Encounter (HOSPITAL_COMMUNITY): Payer: Self-pay

## 2016-06-28 DIAGNOSIS — C3412 Malignant neoplasm of upper lobe, left bronchus or lung: Secondary | ICD-10-CM | POA: Diagnosis present

## 2016-06-28 DIAGNOSIS — Z5112 Encounter for antineoplastic immunotherapy: Secondary | ICD-10-CM | POA: Diagnosis not present

## 2016-06-28 DIAGNOSIS — E039 Hypothyroidism, unspecified: Secondary | ICD-10-CM

## 2016-06-28 DIAGNOSIS — I2510 Atherosclerotic heart disease of native coronary artery without angina pectoris: Secondary | ICD-10-CM | POA: Diagnosis not present

## 2016-06-28 DIAGNOSIS — C3432 Malignant neoplasm of lower lobe, left bronchus or lung: Secondary | ICD-10-CM | POA: Insufficient documentation

## 2016-06-28 MED ORDER — SODIUM CHLORIDE 0.9 % IJ SOLN
INTRAMUSCULAR | Status: AC
  Filled 2016-06-28: qty 50

## 2016-06-28 MED ORDER — IOPAMIDOL (ISOVUE-300) INJECTION 61%
INTRAVENOUS | Status: AC
  Filled 2016-06-28: qty 30

## 2016-06-28 MED ORDER — IOPAMIDOL (ISOVUE-300) INJECTION 61%
INTRAVENOUS | Status: AC
  Filled 2016-06-28: qty 100

## 2016-06-28 MED ORDER — IOPAMIDOL (ISOVUE-300) INJECTION 61%
30.0000 mL | Freq: Once | INTRAVENOUS | Status: DC | PRN
  Administered 2016-06-28: 30 mL via ORAL
  Filled 2016-06-28: qty 30

## 2016-06-28 MED ORDER — IOPAMIDOL (ISOVUE-300) INJECTION 61%
100.0000 mL | Freq: Once | INTRAVENOUS | Status: AC | PRN
  Administered 2016-06-28: 100 mL via INTRAVENOUS

## 2016-07-05 ENCOUNTER — Encounter: Payer: Self-pay | Admitting: Internal Medicine

## 2016-07-05 ENCOUNTER — Ambulatory Visit (HOSPITAL_BASED_OUTPATIENT_CLINIC_OR_DEPARTMENT_OTHER): Payer: BLUE CROSS/BLUE SHIELD | Admitting: Internal Medicine

## 2016-07-05 ENCOUNTER — Other Ambulatory Visit (HOSPITAL_BASED_OUTPATIENT_CLINIC_OR_DEPARTMENT_OTHER): Payer: BLUE CROSS/BLUE SHIELD

## 2016-07-05 ENCOUNTER — Ambulatory Visit (HOSPITAL_BASED_OUTPATIENT_CLINIC_OR_DEPARTMENT_OTHER): Payer: BLUE CROSS/BLUE SHIELD

## 2016-07-05 VITALS — BP 132/73 | HR 67 | Temp 97.9°F | Resp 17 | Ht 64.0 in | Wt 139.8 lb

## 2016-07-05 DIAGNOSIS — I10 Essential (primary) hypertension: Secondary | ICD-10-CM

## 2016-07-05 DIAGNOSIS — Z5112 Encounter for antineoplastic immunotherapy: Secondary | ICD-10-CM

## 2016-07-05 DIAGNOSIS — C3412 Malignant neoplasm of upper lobe, left bronchus or lung: Secondary | ICD-10-CM

## 2016-07-05 DIAGNOSIS — C3432 Malignant neoplasm of lower lobe, left bronchus or lung: Secondary | ICD-10-CM

## 2016-07-05 DIAGNOSIS — E785 Hyperlipidemia, unspecified: Secondary | ICD-10-CM | POA: Diagnosis not present

## 2016-07-05 DIAGNOSIS — I2510 Atherosclerotic heart disease of native coronary artery without angina pectoris: Secondary | ICD-10-CM | POA: Diagnosis not present

## 2016-07-05 DIAGNOSIS — R5383 Other fatigue: Secondary | ICD-10-CM

## 2016-07-05 DIAGNOSIS — E039 Hypothyroidism, unspecified: Secondary | ICD-10-CM

## 2016-07-05 LAB — CBC WITH DIFFERENTIAL/PLATELET
BASO%: 0.6 % (ref 0.0–2.0)
BASOS ABS: 0 10*3/uL (ref 0.0–0.1)
EOS%: 2.2 % (ref 0.0–7.0)
Eosinophils Absolute: 0.2 10*3/uL (ref 0.0–0.5)
HEMATOCRIT: 46.9 % — AB (ref 34.8–46.6)
HGB: 15.5 g/dL (ref 11.6–15.9)
LYMPH#: 2.9 10*3/uL (ref 0.9–3.3)
LYMPH%: 42.1 % (ref 14.0–49.7)
MCH: 33.2 pg (ref 25.1–34.0)
MCHC: 33 g/dL (ref 31.5–36.0)
MCV: 100.4 fL (ref 79.5–101.0)
MONO#: 0.2 10*3/uL (ref 0.1–0.9)
MONO%: 3 % (ref 0.0–14.0)
NEUT#: 3.6 10*3/uL (ref 1.5–6.5)
NEUT%: 52.1 % (ref 38.4–76.8)
PLATELETS: 200 10*3/uL (ref 145–400)
RBC: 4.67 10*6/uL (ref 3.70–5.45)
RDW: 14.4 % (ref 11.2–14.5)
WBC: 6.9 10*3/uL (ref 3.9–10.3)

## 2016-07-05 LAB — COMPREHENSIVE METABOLIC PANEL
ALBUMIN: 3.7 g/dL (ref 3.5–5.0)
ALK PHOS: 89 U/L (ref 40–150)
ALT: 12 U/L (ref 0–55)
AST: 22 U/L (ref 5–34)
Anion Gap: 10 mEq/L (ref 3–11)
BILIRUBIN TOTAL: 0.35 mg/dL (ref 0.20–1.20)
BUN: 10.2 mg/dL (ref 7.0–26.0)
CO2: 22 meq/L (ref 22–29)
Calcium: 9.5 mg/dL (ref 8.4–10.4)
Chloride: 106 mEq/L (ref 98–109)
Creatinine: 0.8 mg/dL (ref 0.6–1.1)
EGFR: 81 mL/min/{1.73_m2} — ABNORMAL LOW (ref >90)
GLUCOSE: 129 mg/dL (ref 70–140)
Potassium: 3.3 mEq/L — ABNORMAL LOW (ref 3.5–5.1)
Sodium: 138 mEq/L (ref 136–145)
TOTAL PROTEIN: 7.4 g/dL (ref 6.4–8.3)

## 2016-07-05 LAB — TSH: TSH: 7.563 m(IU)/L — ABNORMAL HIGH (ref 0.308–3.960)

## 2016-07-05 MED ORDER — SODIUM CHLORIDE 0.9 % IV SOLN
Freq: Once | INTRAVENOUS | Status: AC
  Administered 2016-07-05: 13:00:00 via INTRAVENOUS

## 2016-07-05 MED ORDER — SODIUM CHLORIDE 0.9 % IV SOLN
200.0000 mg | Freq: Once | INTRAVENOUS | Status: AC
  Administered 2016-07-05: 200 mg via INTRAVENOUS
  Filled 2016-07-05: qty 8

## 2016-07-05 NOTE — Progress Notes (Signed)
[facility] [phone]   [phone]  OFFICE PROGRESS NOTE  [name], NP [address] [place] [address]  DIAGNOSIS: Recurrent non-small cell lung cancer, adenocarcinoma with PDL 1 expression of 60% diagnosed in November 2016. The patient was initially diagnosed as stage IB (T2a, N0, M0) adenocarcinoma in March 2016.  PRIOR THERAPY:  Status post left video-assisted thoracoscopy, wedge resection left lower lobe, thoracoscopic left lower lobectomy and mediastinal lymph node dissection on 08/14/2014.   CURRENT THERAPY: Immunotherapy with Ketruda (pembrolizumab) 200 mg IV every 3 weeks status post 20 cycles. First dose was given 05/12/2015.   INTERVAL HISTORY: Sharon [name] 56 y.o. female returns to the clinic today for follow-up visit accompanied by her daughter. The patient is currently on treatment with immunotherapy with Ketruda (pembrolizumab) status post 20 cycles. The patient continues to complain of fatigue. I increase her dose of levothyroxine last visit but unfortunately the patient was unable to afford the new medication. Her daughter will help her to get her medication today. She denied having any chest pain, shortness of breath, cough or hemoptysis. She has no nausea, vomiting, diarrhea or constipation. She denied having any fever or chills. She is tolerating her treatment with Nat Math (pembrolizumab) fairly well. She had repeat CT scan of the chest, abdomen and pelvis performed recently and she is here for evaluation and discussion of her scan results before starting the next dose of her treatment.  MEDICAL HISTORY: Past Medical History:  Diagnosis Date  . Acute ST segment elevation MI ([place])   . Aortic insufficiency    moderate by echo in 2009  . Arthritis   . Cancer (HCC)    LUNGS  . COPD (chronic obstructive pulmonary disease) ([place])   . Coronary artery disease   . Emphysema of lung ([place])   . Hyperlipidemia   . Ischemic  cardiomyopathy   . Neoplasm of uncertain behavior of left lower lobe of lung    per CT CHEST/PET 2/4 and 07/18/14 @ [facility]  . Occlusion of right coronary artery ([place])   . Pneumonia    2015   HX BRONCHITIS    . S/P coronary artery stent placement    2009  . Shortness of breath dyspnea    WITH EXEERTION   . Tobacco user     ALLERGIES:  is allergic to codeine; lipitor [atorvastatin calcium]; morphine and related; and oxycodone-acetaminophen.  MEDICATIONS:  Current Outpatient Prescriptions  Medication Sig Dispense Refill  . ALPRAZolam (XANAX) 0.25 MG tablet Take 0.25 mg by mouth daily.  0  . aspirin (ASPIR-LOW) 81 MG EC tablet Take 81 mg by mouth daily. On hold    . clopidogrel (PLAVIX) 75 MG tablet Take 1 tablet (75 mg total) by mouth daily. 90 tablet 3  . guaiFENesin (MUCINEX) 600 MG 12 hr tablet Take 600 mg by mouth 2 (two) times daily.    Marland Kitchen levothyroxine (SYNTHROID, LEVOTHROID) 125 MCG tablet Take 1 tablet (125 mcg total) by mouth daily before breakfast. 30 tablet 2  . ondansetron (ZOFRAN) 8 MG tablet TAKE 1 TABLET (8 MG TOTAL) BY MOUTH EVERY 8 (EIGHT) HOURS AS NEEDED FOR NAUSEA OR VOMITING. (Patient not taking: Reported on 06/14/2016) 30 tablet 0  . potassium chloride (MICRO-K) 10 MEQ CR capsule Take 10 mEq by mouth 2 (two) times daily.  0  . rosuvastatin (CRESTOR) 20 MG tablet Take 20 mg by mouth daily.  0  . traMADol (ULTRAM) 50 MG tablet Take 1  tablet (50 mg total) by mouth every 6 (six) hours as needed. (Patient not taking: Reported on 06/14/2016) 60 tablet 0   No current facility-administered medications for this visit.     SURGICAL HISTORY:  Past Surgical History:  Procedure Laterality Date  . CARDIAC CATHETERIZATION  10/15/2007   EF 30-40%  . CESAREAN SECTION    . CHOLECYSTECTOMY    . CORONARY ANGIOPLASTY WITH STENT PLACEMENT     LAD  . ectopic pregnancies requiring laparotomies    . LEAD REMOVAL Left 08/14/2014   Procedure: CRYO INTERCOSTAL NERVE BLOCK;  Surgeon:  Melrose Nakayama, MD;  Location: [facility];  Service: Thoracic;  Laterality: Left;  . LOBECTOMY Left 08/14/2014   Procedure: LEFT LOWER LOBECTOMY;  Surgeon: Melrose Nakayama, MD;  Location: [facility];  Service: Thoracic;  Laterality: Left;  . NODE DISSECTION Left 08/14/2014   Procedure: NODE DISSECTION, LEFT LOWER LOBE LUNG;  Surgeon: Melrose Nakayama, MD;  Location: [facility];  Service: Thoracic;  Laterality: Left;  . SHOULDER SURGERY     LEFT X 2   (REMOVED SOME COLLAR BONE )  . TRANSTHORACIC ECHOCARDIOGRAM  10/17/2007  . TUBAL LIGATION    . US ECHOCARDIOGRAPHY  01/07/2008   EF 55-60%  . VIDEO ASSISTED THORACOSCOPY (VATS)/WEDGE RESECTION Left 08/14/2014   Procedure: VIDEO ASSISTED THORACOSCOPY (VATS)/WEDGE RESECTION;  Surgeon: Melrose Nakayama, MD;  Location: [facility];  Service: Thoracic;  Laterality: Left;    REVIEW OF SYSTEMS:  Constitutional: positive for fatigue Eyes: negative Ears, nose, mouth, throat, and face: negative Respiratory: negative Cardiovascular: negative Gastrointestinal: negative Genitourinary:negative Integument/breast: negative Hematologic/lymphatic: negative Musculoskeletal:negative Neurological: negative Behavioral/Psych: negative Endocrine: negative Allergic/Immunologic: negative   PHYSICAL EXAMINATION: General appearance: alert, cooperative, fatigued and no distress Head: Normocephalic, without obvious abnormality, atraumatic Neck: no adenopathy, no JVD, supple, symmetrical, trachea midline and thyroid not enlarged, symmetric, no tenderness/mass/nodules Lymph nodes: Cervical, supraclavicular, and axillary nodes normal. Resp: clear to auscultation bilaterally Back: negative, symmetric, no curvature. ROM normal. No CVA tenderness. Cardio: regular rate and rhythm, S1, S2 normal, no murmur, click, rub or gallop GI: soft, non-tender; bowel sounds normal; no masses,  no organomegaly Extremities: extremities normal, atraumatic, no cyanosis or edema Neurologic:  Alert and oriented X 3, normal strength and tone. Normal symmetric reflexes. Normal coordination and gait  ECOG PERFORMANCE STATUS: 1 - Symptomatic but completely ambulatory  Blood pressure 132/73, pulse 67, temperature 97.9 F (36.6 C), temperature source Oral, resp. rate 17, height '5\' 4"'$  (1.626 m), weight 139 lb 12.8 oz (63.4 kg), SpO2 98 %.  LABORATORY DATA: Lab Results  Component Value Date   WBC 6.9 07/05/2016   HGB 15.5 07/05/2016   HCT 46.9 (H) 07/05/2016   MCV 100.4 07/05/2016   PLT 200 07/05/2016      Chemistry      Component Value Date/Time   NA 138 07/05/2016 1023   K 3.3 (L) 07/05/2016 1023   CL 109 08/16/2014 0520   CO2 22 07/05/2016 1023   BUN 10.2 07/05/2016 1023   CREATININE 0.8 07/05/2016 1023      Component Value Date/Time   CALCIUM 9.5 07/05/2016 1023   ALKPHOS 89 07/05/2016 1023   AST 22 07/05/2016 1023   ALT 12 07/05/2016 1023   BILITOT 0.35 07/05/2016 1023       RADIOGRAPHIC STUDIES: Ct Chest W Contrast  Result Date: 06/28/2016 CLINICAL DATA:  Lung cancer diagnosed in 2016. Primaries of left lower and left upper lobe. Currently on immunotherapy. Left lung resection. Left-sided abdominal  pain for 1 month. EXAM: CT CHEST, ABDOMEN, AND PELVIS WITH CONTRAST TECHNIQUE: Multidetector CT imaging of the chest, abdomen and pelvis was performed following the standard protocol during bolus administration of intravenous contrast. CONTRAST:  152m ISOVUE-300 IOPAMIDOL (ISOVUE-300) INJECTION 61% COMPARISON:  04/05/2016 FINDINGS: CT CHEST FINDINGS Cardiovascular: Age advanced aortic and branch vessel atherosclerosis, with mild to moderate stenosis of the right brachiocephalic artery on image 141/[id number]. Ascending aorta upper normal at 3.8 cm, similar. Borderline cardiomegaly with probable prior left ventricular apical infarct. Multivessel coronary artery atherosclerosis. No central pulmonary embolism, on this non-dedicated study. Mediastinum/Nodes: No supraclavicular  adenopathy. Right paratracheal node is unchanged at 1.2 cm on image 18/series 2. Prevascular node measures 2.2 x 1.0 cm on image 25/series 2. This is unchanged (when remeasured). Lungs/Pleura: No pleural fluid. Moderate centrilobular and paraseptal emphysema. Vague 3 mm left upper lobe pulmonary nodule on image 39/series 4 is unchanged. Left upper lobe scarring anteriorly. 3 mm left upper lobe pulmonary nodule on image 47/ series 4. Left lower lobe scarring, including on image 121/ series 4. Musculoskeletal: No acute osseous abnormality. Degenerate sclerosis at T10-11 with Schmorl's node deformities, similar. CT ABDOMEN PELVIS FINDINGS Hepatobiliary: Normal liver. Cholecystectomy, without biliary ductal dilatation. Pancreas: Normal, without mass or ductal dilatation. Spleen: Normal in size, without focal abnormality. Adrenals/Urinary Tract: Normal adrenal glands. Normal kidneys, without hydronephrosis. Minimal air in the anterior urinary bladder. Stomach/Bowel: Normal stomach, without wall thickening. Normal colon, appendix, and terminal ileum. Normal small bowel. Vascular/Lymphatic: Advanced aortic and branch vessel atherosclerosis. No abdominopelvic adenopathy. Reproductive: Normal uterus and adnexa. Other: No significant free fluid. No evidence of omental or peritoneal disease. Musculoskeletal: No acute osseous abnormality. Degenerate disc disease at the lumbosacral junction. IMPRESSION: 1. Similar thoracic adenopathy. 2. No evidence of progressive disease. 3. No acute process or evidence of metastatic disease in the abdomen or pelvis. 4. Minimal air in the urinary bladder could be iatrogenic. Correlate with instrumentation. 5. Age advanced coronary artery atherosclerosis. Recommend assessment of coronary risk factors and consideration of medical therapy. 6. Tiny stable pulmonary nodules. Electronically Signed   By: KAbigail MiyamotoM.D.   On: 06/28/2016 12:37   Ct Abdomen Pelvis W Contrast  Result Date:  06/28/2016 CLINICAL DATA:  Lung cancer diagnosed in 2016. Primaries of left lower and left upper lobe. Currently on immunotherapy. Left lung resection. Left-sided abdominal pain for 1 month. EXAM: CT CHEST, ABDOMEN, AND PELVIS WITH CONTRAST TECHNIQUE: Multidetector CT imaging of the chest, abdomen and pelvis was performed following the standard protocol during bolus administration of intravenous contrast. CONTRAST:  1071mISOVUE-300 IOPAMIDOL (ISOVUE-300) INJECTION 61% COMPARISON:  04/05/2016 FINDINGS: CT CHEST FINDINGS Cardiovascular: Age advanced aortic and branch vessel atherosclerosis, with mild to moderate stenosis of the right brachiocephalic artery on image 1840/[id number]. Ascending aorta upper normal at 3.8 cm, similar. Borderline cardiomegaly with probable prior left ventricular apical infarct. Multivessel coronary artery atherosclerosis. No central pulmonary embolism, on this non-dedicated study. Mediastinum/Nodes: No supraclavicular adenopathy. Right paratracheal node is unchanged at 1.2 cm on image 18/series 2. Prevascular node measures 2.2 x 1.0 cm on image 25/series 2. This is unchanged (when remeasured). Lungs/Pleura: No pleural fluid. Moderate centrilobular and paraseptal emphysema. Vague 3 mm left upper lobe pulmonary nodule on image 39/series 4 is unchanged. Left upper lobe scarring anteriorly. 3 mm left upper lobe pulmonary nodule on image 47/ series 4. Left lower lobe scarring, including on image 121/ series 4. Musculoskeletal: No acute osseous abnormality. Degenerate sclerosis at T10-11 with Schmorl's node deformities, similar. CT  ABDOMEN PELVIS FINDINGS Hepatobiliary: Normal liver. Cholecystectomy, without biliary ductal dilatation. Pancreas: Normal, without mass or ductal dilatation. Spleen: Normal in size, without focal abnormality. Adrenals/Urinary Tract: Normal adrenal glands. Normal kidneys, without hydronephrosis. Minimal air in the anterior urinary bladder. Stomach/Bowel: Normal stomach,  without wall thickening. Normal colon, appendix, and terminal ileum. Normal small bowel. Vascular/Lymphatic: Advanced aortic and branch vessel atherosclerosis. No abdominopelvic adenopathy. Reproductive: Normal uterus and adnexa. Other: No significant free fluid. No evidence of omental or peritoneal disease. Musculoskeletal: No acute osseous abnormality. Degenerate disc disease at the lumbosacral junction. IMPRESSION: 1. Similar thoracic adenopathy. 2. No evidence of progressive disease. 3. No acute process or evidence of metastatic disease in the abdomen or pelvis. 4. Minimal air in the urinary bladder could be iatrogenic. Correlate with instrumentation. 5. Age advanced coronary artery atherosclerosis. Recommend assessment of coronary risk factors and consideration of medical therapy. 6. Tiny stable pulmonary nodules. Electronically Signed   By: Abigail Miyamoto M.D.   On: 06/28/2016 12:37    ASSESSMENT AND PLAN:  This is a very pleasant 56 years old white female with recurrent non-small cell lung cancer, adenocarcinoma with PDL 1 expression of 60%. She is currently on treatment with Ketruda (pembrolizumab) 200 mg IV every 3 weeks status post 20 cycles. She has been tolerating the treatment well except for fatigue. The patient had repeat CT scan of the chest, abdomen and pelvis performed recently. I personally and independently reviewed the scan images and discuss the results with the patient and her daughter. Her scan showed no evidence for disease progression. I recommended for the patient to continue her current treatment with Ketruda (pembrolizumab) every 3 weeks. She will proceed with cycle #21 today. For hypothyroidism, I increased her dose of levothyroxine to 125 g by mouth daily. Her daughter will help the patient to get continue on the medication. I will continue to adjust her doses depending on TSH. For hypertension, her blood pressure is much better today. We will continue to monitor for  now. For the dyslipidemia and coronary artery disease, the patient will continue with her current medication. She will come back for follow-up visit in 3 weeks for evaluation before starting cycle #22. The patient was advised to call immediately if she has any concerning symptoms in the interval. The patient voices understanding of current disease status and treatment options and is in agreement with the current care plan.  All questions were answered. The patient knows to call the clinic with any problems, questions or concerns. We can certainly see the patient much sooner if necessary.  Disclaimer: This note was dictated with voice recognition software. Similar sounding words can inadvertently be transcribed and may not be corrected upon review.

## 2016-07-05 NOTE — Patient Instructions (Signed)
[facility] Discharge Instructions for Patients Receiving Chemotherapy  Today you received the following chemotherapy agents:  Keytruda.  To help prevent nausea and vomiting after your treatment, we encourage you to take your nausea medication as prescribed.   If you develop nausea and vomiting that is not controlled by your nausea medication, call the clinic.   BELOW ARE SYMPTOMS THAT SHOULD BE REPORTED IMMEDIATELY:  *FEVER GREATER THAN 100.5 F  *CHILLS WITH OR WITHOUT FEVER  NAUSEA AND VOMITING THAT IS NOT CONTROLLED WITH YOUR NAUSEA MEDICATION  *UNUSUAL SHORTNESS OF BREATH  *UNUSUAL BRUISING OR BLEEDING  TENDERNESS IN MOUTH AND THROAT WITH OR WITHOUT PRESENCE OF ULCERS  *URINARY PROBLEMS  *BOWEL PROBLEMS  UNUSUAL RASH Items with * indicate a potential emergency and should be followed up as soon as possible.  Feel free to call the clinic you have any questions or concerns. The clinic phone number is [phone].  Please show the CHEMO ALERT CARD at check-in to the Emergency Department and triage nurse.   

## 2016-07-05 NOTE — Patient Instructions (Signed)
Steps to Quit Smoking Smoking tobacco can be bad for your health. It can also affect almost every organ in your body. Smoking puts you and people around you at risk for many serious long-lasting (chronic) diseases. Quitting smoking is hard, but it is one of the best things that you can do for your health. It is never too late to quit. What are the benefits of quitting smoking? When you quit smoking, you lower your risk for getting serious diseases and conditions. They can include:  Lung cancer or lung disease.  Heart disease.  Stroke.  Heart attack.  Not being able to have children (infertility).  Weak bones (osteoporosis) and broken bones (fractures). If you have coughing, wheezing, and shortness of breath, those symptoms may get better when you quit. You may also get sick less often. If you are pregnant, quitting smoking can help to lower your chances of having a baby of low birth weight. What can I do to help me quit smoking? Talk with your doctor about what can help you quit smoking. Some things you can do (strategies) include:  Quitting smoking totally, instead of slowly cutting back how much you smoke over a period of time.  Going to in-person counseling. You are more likely to quit if you go to many counseling sessions.  Using resources and support systems, such as:  Online chats with a counselor.  Phone quitlines.  Printed self-help materials.  Support groups or group counseling.  Text messaging programs.  Mobile phone apps or applications.  Taking medicines. Some of these medicines may have nicotine in them. If you are pregnant or breastfeeding, do not take any medicines to quit smoking unless your doctor says it is okay. Talk with your doctor about counseling or other things that can help you. Talk with your doctor about using more than one strategy at the same time, such as taking medicines while you are also going to in-person counseling. This can help make quitting  easier. What things can I do to make it easier to quit? Quitting smoking might feel very hard at first, but there is a lot that you can do to make it easier. Take these steps:  Talk to your family and friends. Ask them to support and encourage you.  Call phone quitlines, reach out to support groups, or work with a counselor.  Ask people who smoke to not smoke around you.  Avoid places that make you want (trigger) to smoke, such as:  Bars.  Parties.  Smoke-break areas at work.  Spend time with people who do not smoke.  Lower the stress in your life. Stress can make you want to smoke. Try these things to help your stress:  Getting regular exercise.  Deep-breathing exercises.  Yoga.  Meditating.  Doing a body scan. To do this, close your eyes, focus on one area of your body at a time from head to toe, and notice which parts of your body are tense. Try to relax the muscles in those areas.  Download or buy apps on your mobile phone or tablet that can help you stick to your quit plan. There are many free apps, such as QuitGuide from the CDC (Centers for Disease Control and Prevention). You can find more support from smokefree.gov and other websites. This information is not intended to replace advice given to you by your health care provider. Make sure you discuss any questions you have with your health care provider. Document Released: 03/12/2009 Document Revised: 01/12/2016 Document   Reviewed: 09/30/2014 Elsevier Interactive Patient Education  2017 [employer] Inc.  

## 2016-07-05 NOTE — Progress Notes (Signed)
Per pt request, IV started in the right [facility]

## 2016-07-26 ENCOUNTER — Other Ambulatory Visit: Payer: Self-pay | Admitting: Medical Oncology

## 2016-07-26 ENCOUNTER — Telehealth: Payer: Self-pay | Admitting: Internal Medicine

## 2016-07-26 ENCOUNTER — Other Ambulatory Visit (HOSPITAL_BASED_OUTPATIENT_CLINIC_OR_DEPARTMENT_OTHER): Payer: BLUE CROSS/BLUE SHIELD

## 2016-07-26 ENCOUNTER — Ambulatory Visit (HOSPITAL_BASED_OUTPATIENT_CLINIC_OR_DEPARTMENT_OTHER): Payer: BLUE CROSS/BLUE SHIELD

## 2016-07-26 ENCOUNTER — Ambulatory Visit (HOSPITAL_BASED_OUTPATIENT_CLINIC_OR_DEPARTMENT_OTHER): Payer: BLUE CROSS/BLUE SHIELD | Admitting: Internal Medicine

## 2016-07-26 ENCOUNTER — Encounter: Payer: Self-pay | Admitting: Internal Medicine

## 2016-07-26 VITALS — BP 125/75 | HR 72 | Temp 98.5°F | Resp 17 | Ht 64.0 in | Wt 139.5 lb

## 2016-07-26 DIAGNOSIS — C3432 Malignant neoplasm of lower lobe, left bronchus or lung: Secondary | ICD-10-CM

## 2016-07-26 DIAGNOSIS — Z560 Unemployment, unspecified: Secondary | ICD-10-CM

## 2016-07-26 DIAGNOSIS — Z5112 Encounter for antineoplastic immunotherapy: Secondary | ICD-10-CM | POA: Diagnosis not present

## 2016-07-26 DIAGNOSIS — R1012 Left upper quadrant pain: Secondary | ICD-10-CM | POA: Diagnosis not present

## 2016-07-26 DIAGNOSIS — C3412 Malignant neoplasm of upper lobe, left bronchus or lung: Secondary | ICD-10-CM

## 2016-07-26 DIAGNOSIS — G8929 Other chronic pain: Secondary | ICD-10-CM

## 2016-07-26 DIAGNOSIS — E039 Hypothyroidism, unspecified: Secondary | ICD-10-CM

## 2016-07-26 LAB — CBC WITH DIFFERENTIAL/PLATELET
BASO%: 0.8 % (ref 0.0–2.0)
Basophils Absolute: 0 10*3/uL (ref 0.0–0.1)
EOS%: 1.3 % (ref 0.0–7.0)
Eosinophils Absolute: 0.1 10*3/uL (ref 0.0–0.5)
HCT: 43.9 % (ref 34.8–46.6)
HGB: 15.1 g/dL (ref 11.6–15.9)
LYMPH%: 36.9 % (ref 14.0–49.7)
MCH: 33.6 pg (ref 25.1–34.0)
MCHC: 34.4 g/dL (ref 31.5–36.0)
MCV: 97.4 fL (ref 79.5–101.0)
MONO#: 0.3 10*3/uL (ref 0.1–0.9)
MONO%: 5.7 % (ref 0.0–14.0)
NEUT%: 55.3 % (ref 38.4–76.8)
NEUTROS ABS: 3.3 10*3/uL (ref 1.5–6.5)
Platelets: 184 10*3/uL (ref 145–400)
RBC: 4.51 10*6/uL (ref 3.70–5.45)
RDW: 14.2 % (ref 11.2–14.5)
WBC: 6 10*3/uL (ref 3.9–10.3)
lymph#: 2.2 10*3/uL (ref 0.9–3.3)

## 2016-07-26 LAB — COMPREHENSIVE METABOLIC PANEL
ALT: 13 U/L (ref 0–55)
AST: 18 U/L (ref 5–34)
Albumin: 3.5 g/dL (ref 3.5–5.0)
Alkaline Phosphatase: 83 U/L (ref 40–150)
Anion Gap: 8 mEq/L (ref 3–11)
BUN: 9.2 mg/dL (ref 7.0–26.0)
CHLORIDE: 107 meq/L (ref 98–109)
CO2: 25 meq/L (ref 22–29)
Calcium: 9.4 mg/dL (ref 8.4–10.4)
Creatinine: 0.8 mg/dL (ref 0.6–1.1)
EGFR: 85 mL/min/{1.73_m2} — ABNORMAL LOW (ref >90)
GLUCOSE: 128 mg/dL (ref 70–140)
POTASSIUM: 3.5 meq/L (ref 3.5–5.1)
SODIUM: 139 meq/L (ref 136–145)
Total Bilirubin: 0.39 mg/dL (ref 0.20–1.20)
Total Protein: 7.1 g/dL (ref 6.4–8.3)

## 2016-07-26 LAB — TSH: TSH: 5.798 m(IU)/L — ABNORMAL HIGH (ref 0.308–3.960)

## 2016-07-26 MED ORDER — SODIUM CHLORIDE 0.9 % IV SOLN
200.0000 mg | Freq: Once | INTRAVENOUS | Status: AC
  Administered 2016-07-26: 200 mg via INTRAVENOUS
  Filled 2016-07-26: qty 8

## 2016-07-26 MED ORDER — SODIUM CHLORIDE 0.9 % IV SOLN
Freq: Once | INTRAVENOUS | Status: AC
  Administered 2016-07-26: 12:00:00 via INTRAVENOUS

## 2016-07-26 NOTE — Telephone Encounter (Signed)
Appointments scheduled per 2/27 LOS. Patient given AVS report and calendars with future scheduled appointments.

## 2016-07-26 NOTE — Patient Instructions (Signed)
Steps to Quit Smoking Smoking tobacco can be bad for your health. It can also affect almost every organ in your body. Smoking puts you and people around you at risk for many serious long-lasting (chronic) diseases. Quitting smoking is hard, but it is one of the best things that you can do for your health. It is never too late to quit. What are the benefits of quitting smoking? When you quit smoking, you lower your risk for getting serious diseases and conditions. They can include:  Lung cancer or lung disease.  Heart disease.  Stroke.  Heart attack.  Not being able to have children (infertility).  Weak bones (osteoporosis) and broken bones (fractures). If you have coughing, wheezing, and shortness of breath, those symptoms may get better when you quit. You may also get sick less often. If you are pregnant, quitting smoking can help to lower your chances of having a baby of low birth weight. What can I do to help me quit smoking? Talk with your doctor about what can help you quit smoking. Some things you can do (strategies) include:  Quitting smoking totally, instead of slowly cutting back how much you smoke over a period of time.  Going to in-person counseling. You are more likely to quit if you go to many counseling sessions.  Using resources and support systems, such as:  Online chats with a counselor.  Phone quitlines.  Printed self-help materials.  Support groups or group counseling.  Text messaging programs.  Mobile phone apps or applications.  Taking medicines. Some of these medicines may have nicotine in them. If you are pregnant or breastfeeding, do not take any medicines to quit smoking unless your doctor says it is okay. Talk with your doctor about counseling or other things that can help you. Talk with your doctor about using more than one strategy at the same time, such as taking medicines while you are also going to in-person counseling. This can help make quitting  easier. What things can I do to make it easier to quit? Quitting smoking might feel very hard at first, but there is a lot that you can do to make it easier. Take these steps:  Talk to your family and friends. Ask them to support and encourage you.  Call phone quitlines, reach out to support groups, or work with a counselor.  Ask people who smoke to not smoke around you.  Avoid places that make you want (trigger) to smoke, such as:  Bars.  Parties.  Smoke-break areas at work.  Spend time with people who do not smoke.  Lower the stress in your life. Stress can make you want to smoke. Try these things to help your stress:  Getting regular exercise.  Deep-breathing exercises.  Yoga.  Meditating.  Doing a body scan. To do this, close your eyes, focus on one area of your body at a time from head to toe, and notice which parts of your body are tense. Try to relax the muscles in those areas.  Download or buy apps on your mobile phone or tablet that can help you stick to your quit plan. There are many free apps, such as QuitGuide from the CDC (Centers for Disease Control and Prevention). You can find more support from smokefree.gov and other websites. This information is not intended to replace advice given to you by your health care provider. Make sure you discuss any questions you have with your health care provider. Document Released: 03/12/2009 Document Revised: 01/12/2016 Document   Reviewed: 09/30/2014 Elsevier Interactive Patient Education  2017 [employer] Inc.  

## 2016-07-26 NOTE — Patient Instructions (Signed)
[facility] Discharge Instructions for Patients Receiving Chemotherapy  Today you received the following chemotherapy agents:  Keytruda.  To help prevent nausea and vomiting after your treatment, we encourage you to take your nausea medication as prescribed.   If you develop nausea and vomiting that is not controlled by your nausea medication, call the clinic.   BELOW ARE SYMPTOMS THAT SHOULD BE REPORTED IMMEDIATELY:  *FEVER GREATER THAN 100.5 F  *CHILLS WITH OR WITHOUT FEVER  NAUSEA AND VOMITING THAT IS NOT CONTROLLED WITH YOUR NAUSEA MEDICATION  *UNUSUAL SHORTNESS OF BREATH  *UNUSUAL BRUISING OR BLEEDING  TENDERNESS IN MOUTH AND THROAT WITH OR WITHOUT PRESENCE OF ULCERS  *URINARY PROBLEMS  *BOWEL PROBLEMS  UNUSUAL RASH Items with * indicate a potential emergency and should be followed up as soon as possible.  Feel free to call the clinic you have any questions or concerns. The clinic phone number is [phone].  Please show the CHEMO ALERT CARD at check-in to the Emergency Department and triage nurse.   

## 2016-07-26 NOTE — Progress Notes (Signed)
[facility] [phone]   Fax:([phone]  OFFICE PROGRESS NOTE  [name], NP [address] [place] [address]  DIAGNOSIS: Recurrent non-small cell lung cancer, adenocarcinoma with PDL 1 expression of 60% diagnosed in November 2016. The patient was initially diagnosed as stage IB (T2a, N0, M0) adenocarcinoma in March 2016.  PRIOR THERAPY:  Status post left video-assisted thoracoscopy, wedge resection left lower lobe, thoracoscopic left lower lobectomy and mediastinal lymph node dissection on 08/14/2014.   CURRENT THERAPY: Immunotherapy with Ketruda (pembrolizumab) 200 mg IV every 3 weeks status post 21 cycles. First dose was given 05/12/2015.   INTERVAL HISTORY: Sharon [name] 56 y.o. female came to the clinic today for follow-up visit accompanied by her daughter. The patient is feeling fine except for left upper quadrant pain started last night and has persisted this morning. She also has some burping. She denied having any nausea or vomiting. She has no fever or chills. She denied having any significant chest pain, shortness of breath, cough or hemoptysis. She has been tolerating her treatment with immunotherapy fairly well. Unfortunately she lost her job and she would like to apply for social service and disability. She is here today for evaluation before starting cycle #22 her treatment.  MEDICAL HISTORY: Past Medical History:  Diagnosis Date  . Acute ST segment elevation MI ([place])   . Aortic insufficiency    moderate by echo in 2009  . Arthritis   . Cancer (HCC)    LUNGS  . COPD (chronic obstructive pulmonary disease) ([place])   . Coronary artery disease   . Emphysema of lung ([place])   . Hyperlipidemia   . Ischemic cardiomyopathy   . Neoplasm of uncertain behavior of left lower lobe of lung    per CT CHEST/PET 2/4 and 07/18/14 @ [facility]  . Occlusion of right coronary artery ([place])   . Pneumonia    2015   HX BRONCHITIS    . S/P coronary  artery stent placement    2009  . Shortness of breath dyspnea    WITH EXEERTION   . Tobacco user     ALLERGIES:  is allergic to codeine; lipitor [atorvastatin calcium]; morphine and related; and oxycodone-acetaminophen.  MEDICATIONS:  Current Outpatient Prescriptions  Medication Sig Dispense Refill  . ALPRAZolam (XANAX) 0.25 MG tablet Take 0.25 mg by mouth daily.  0  . aspirin (ASPIR-LOW) 81 MG EC tablet Take 81 mg by mouth daily. On hold    . clopidogrel (PLAVIX) 75 MG tablet Take 1 tablet (75 mg total) by mouth daily. 90 tablet 3  . guaiFENesin (MUCINEX) 600 MG 12 hr tablet Take 600 mg by mouth 2 (two) times daily.    Marland Kitchen levothyroxine (SYNTHROID, LEVOTHROID) 125 MCG tablet Take 1 tablet (125 mcg total) by mouth daily before breakfast. (Patient not taking: Reported on 07/05/2016) 30 tablet 2  . ondansetron (ZOFRAN) 8 MG tablet TAKE 1 TABLET (8 MG TOTAL) BY MOUTH EVERY 8 (EIGHT) HOURS AS NEEDED FOR NAUSEA OR VOMITING. (Patient not taking: Reported on 06/14/2016) 30 tablet 0  . potassium chloride (MICRO-K) 10 MEQ CR capsule Take 10 mEq by mouth 2 (two) times daily.  0  . rosuvastatin (CRESTOR) 20 MG tablet Take 20 mg by mouth daily.  0  . traMADol (ULTRAM) 50 MG tablet Take 1 tablet (50 mg total) by mouth every 6 (six) hours as needed. 60 tablet 0   No current facility-administered medications for this visit.  SURGICAL HISTORY:  Past Surgical History:  Procedure Laterality Date  . CARDIAC CATHETERIZATION  10/15/2007   EF 30-40%  . CESAREAN SECTION    . CHOLECYSTECTOMY    . CORONARY ANGIOPLASTY WITH STENT PLACEMENT     LAD  . ectopic pregnancies requiring laparotomies    . LEAD REMOVAL Left 08/14/2014   Procedure: CRYO INTERCOSTAL NERVE BLOCK;  Surgeon: Melrose Nakayama, MD;  Location: [facility];  Service: Thoracic;  Laterality: Left;  . LOBECTOMY Left 08/14/2014   Procedure: LEFT LOWER LOBECTOMY;  Surgeon: Melrose Nakayama, MD;  Location: [facility];  Service: Thoracic;  Laterality:  Left;  . NODE DISSECTION Left 08/14/2014   Procedure: NODE DISSECTION, LEFT LOWER LOBE LUNG;  Surgeon: Melrose Nakayama, MD;  Location: [facility];  Service: Thoracic;  Laterality: Left;  . SHOULDER SURGERY     LEFT X 2   (REMOVED SOME COLLAR BONE )  . TRANSTHORACIC ECHOCARDIOGRAM  10/17/2007  . TUBAL LIGATION    . US ECHOCARDIOGRAPHY  01/07/2008   EF 55-60%  . VIDEO ASSISTED THORACOSCOPY (VATS)/WEDGE RESECTION Left 08/14/2014   Procedure: VIDEO ASSISTED THORACOSCOPY (VATS)/WEDGE RESECTION;  Surgeon: Melrose Nakayama, MD;  Location: [facility];  Service: Thoracic;  Laterality: Left;    REVIEW OF SYSTEMS:  A comprehensive review of systems was negative except for: Gastrointestinal: positive for abdominal pain   PHYSICAL EXAMINATION: General appearance: alert, cooperative and no distress Head: Normocephalic, without obvious abnormality, atraumatic Neck: no adenopathy, no JVD, supple, symmetrical, trachea midline and thyroid not enlarged, symmetric, no tenderness/mass/nodules Lymph nodes: Cervical, supraclavicular, and axillary nodes normal. Resp: clear to auscultation bilaterally Back: negative, symmetric, no curvature. ROM normal. No CVA tenderness. Cardio: regular rate and rhythm, S1, S2 normal, no murmur, click, rub or gallop GI: Mild tenderness to palpation at the left upper quadrant. Extremities: extremities normal, atraumatic, no cyanosis or edema  ECOG PERFORMANCE STATUS: 1 - Symptomatic but completely ambulatory  Blood pressure 125/75, pulse 72, temperature 98.5 F (36.9 C), temperature source Oral, resp. rate 17, height '5\' 4"'$  (1.626 m), weight 139 lb 8 oz (63.3 kg), SpO2 98 %.  LABORATORY DATA: Lab Results  Component Value Date   WBC 6.0 07/26/2016   HGB 15.1 07/26/2016   HCT 43.9 07/26/2016   MCV 97.4 07/26/2016   PLT 184 07/26/2016      Chemistry      Component Value Date/Time   NA 138 07/05/2016 1023   K 3.3 (L) 07/05/2016 1023   CL 109 08/16/2014 0520   CO2 22  07/05/2016 1023   BUN 10.2 07/05/2016 1023   CREATININE 0.8 07/05/2016 1023      Component Value Date/Time   CALCIUM 9.5 07/05/2016 1023   ALKPHOS 89 07/05/2016 1023   AST 22 07/05/2016 1023   ALT 12 07/05/2016 1023   BILITOT 0.35 07/05/2016 1023       RADIOGRAPHIC STUDIES: Ct Chest W Contrast  Result Date: 06/28/2016 CLINICAL DATA:  Lung cancer diagnosed in 2016. Primaries of left lower and left upper lobe. Currently on immunotherapy. Left lung resection. Left-sided abdominal pain for 1 month. EXAM: CT CHEST, ABDOMEN, AND PELVIS WITH CONTRAST TECHNIQUE: Multidetector CT imaging of the chest, abdomen and pelvis was performed following the standard protocol during bolus administration of intravenous contrast. CONTRAST:  126m ISOVUE-300 IOPAMIDOL (ISOVUE-300) INJECTION 61% COMPARISON:  04/05/2016 FINDINGS: CT CHEST FINDINGS Cardiovascular: Age advanced aortic and branch vessel atherosclerosis, with mild to moderate stenosis of the right brachiocephalic artery on image 106/[id number]. Ascending aorta  upper normal at 3.8 cm, similar. Borderline cardiomegaly with probable prior left ventricular apical infarct. Multivessel coronary artery atherosclerosis. No central pulmonary embolism, on this non-dedicated study. Mediastinum/Nodes: No supraclavicular adenopathy. Right paratracheal node is unchanged at 1.2 cm on image 18/series 2. Prevascular node measures 2.2 x 1.0 cm on image 25/series 2. This is unchanged (when remeasured). Lungs/Pleura: No pleural fluid. Moderate centrilobular and paraseptal emphysema. Vague 3 mm left upper lobe pulmonary nodule on image 39/series 4 is unchanged. Left upper lobe scarring anteriorly. 3 mm left upper lobe pulmonary nodule on image 47/ series 4. Left lower lobe scarring, including on image 121/ series 4. Musculoskeletal: No acute osseous abnormality. Degenerate sclerosis at T10-11 with Schmorl's node deformities, similar. CT ABDOMEN PELVIS FINDINGS Hepatobiliary: Normal  liver. Cholecystectomy, without biliary ductal dilatation. Pancreas: Normal, without mass or ductal dilatation. Spleen: Normal in size, without focal abnormality. Adrenals/Urinary Tract: Normal adrenal glands. Normal kidneys, without hydronephrosis. Minimal air in the anterior urinary bladder. Stomach/Bowel: Normal stomach, without wall thickening. Normal colon, appendix, and terminal ileum. Normal small bowel. Vascular/Lymphatic: Advanced aortic and branch vessel atherosclerosis. No abdominopelvic adenopathy. Reproductive: Normal uterus and adnexa. Other: No significant free fluid. No evidence of omental or peritoneal disease. Musculoskeletal: No acute osseous abnormality. Degenerate disc disease at the lumbosacral junction. IMPRESSION: 1. Similar thoracic adenopathy. 2. No evidence of progressive disease. 3. No acute process or evidence of metastatic disease in the abdomen or pelvis. 4. Minimal air in the urinary bladder could be iatrogenic. Correlate with instrumentation. 5. Age advanced coronary artery atherosclerosis. Recommend assessment of coronary risk factors and consideration of medical therapy. 6. Tiny stable pulmonary nodules. Electronically Signed   By: Abigail Miyamoto M.D.   On: 06/28/2016 12:37   Ct Abdomen Pelvis W Contrast  Result Date: 06/28/2016 CLINICAL DATA:  Lung cancer diagnosed in 2016. Primaries of left lower and left upper lobe. Currently on immunotherapy. Left lung resection. Left-sided abdominal pain for 1 month. EXAM: CT CHEST, ABDOMEN, AND PELVIS WITH CONTRAST TECHNIQUE: Multidetector CT imaging of the chest, abdomen and pelvis was performed following the standard protocol during bolus administration of intravenous contrast. CONTRAST:  153m ISOVUE-300 IOPAMIDOL (ISOVUE-300) INJECTION 61% COMPARISON:  04/05/2016 FINDINGS: CT CHEST FINDINGS Cardiovascular: Age advanced aortic and branch vessel atherosclerosis, with mild to moderate stenosis of the right brachiocephalic artery on image  18/series 2. Ascending aorta upper normal at 3.8 cm, similar. Borderline cardiomegaly with probable prior left ventricular apical infarct. Multivessel coronary artery atherosclerosis. No central pulmonary embolism, on this non-dedicated study. Mediastinum/Nodes: No supraclavicular adenopathy. Right paratracheal node is unchanged at 1.2 cm on image 18/series 2. Prevascular node measures 2.2 x 1.0 cm on image 25/series 2. This is unchanged (when remeasured). Lungs/Pleura: No pleural fluid. Moderate centrilobular and paraseptal emphysema. Vague 3 mm left upper lobe pulmonary nodule on image 39/series 4 is unchanged. Left upper lobe scarring anteriorly. 3 mm left upper lobe pulmonary nodule on image 47/ series 4. Left lower lobe scarring, including on image 121/ series 4. Musculoskeletal: No acute osseous abnormality. Degenerate sclerosis at T10-11 with Schmorl's node deformities, similar. CT ABDOMEN PELVIS FINDINGS Hepatobiliary: Normal liver. Cholecystectomy, without biliary ductal dilatation. Pancreas: Normal, without mass or ductal dilatation. Spleen: Normal in size, without focal abnormality. Adrenals/Urinary Tract: Normal adrenal glands. Normal kidneys, without hydronephrosis. Minimal air in the anterior urinary bladder. Stomach/Bowel: Normal stomach, without wall thickening. Normal colon, appendix, and terminal ileum. Normal small bowel. Vascular/Lymphatic: Advanced aortic and branch vessel atherosclerosis. No abdominopelvic adenopathy. Reproductive: Normal uterus and adnexa. Other: No  significant free fluid. No evidence of omental or peritoneal disease. Musculoskeletal: No acute osseous abnormality. Degenerate disc disease at the lumbosacral junction. IMPRESSION: 1. Similar thoracic adenopathy. 2. No evidence of progressive disease. 3. No acute process or evidence of metastatic disease in the abdomen or pelvis. 4. Minimal air in the urinary bladder could be iatrogenic. Correlate with instrumentation. 5. Age  advanced coronary artery atherosclerosis. Recommend assessment of coronary risk factors and consideration of medical therapy. 6. Tiny stable pulmonary nodules. Electronically Signed   By: Abigail Miyamoto M.D.   On: 06/28/2016 12:37    ASSESSMENT AND PLAN:  This is a very pleasant 56 years old white female with recurrent non-small cell lung cancer, adenocarcinoma with PDL 1 expression of 60%. She is currently undergoing treatment with Ketruda (pembrolizumab) 200 mg IV every 3 weeks is status post 21 cycles. The patient isolating the treatment well with no significant adverse effects. I recommended for her to proceed with cycle #22 today as scheduled. Her left upper quadrant abdominal pain is likely secondary to gastric issues. I recommended for the patient to take Prilosec or Nexium over-the-counter. She will also continue on tramadol for pain management. I will see her back for follow-up visit in 3 weeks for evaluation before the next dose of her treatment. I will also refer the patient to the social worker at the cancer center to help her with her social service paperwork and guidance. She was advised to call immediately if she has any concerning symptoms in the interval. The patient voices understanding of current disease status and treatment options and is in agreement with the current care plan.  All questions were answered. The patient knows to call the clinic with any problems, questions or concerns. We can certainly see the patient much sooner if necessary. I spent 10 minutes counseling the patient face to face. The total time spent in the appointment was 15 minutes.  Disclaimer: This note was dictated with voice recognition software. Similar sounding words can inadvertently be transcribed and may not be corrected upon review.

## 2016-07-29 ENCOUNTER — Encounter: Payer: Self-pay | Admitting: *Deleted

## 2016-07-29 NOTE — Progress Notes (Signed)
[place] Work  Clinical Social Work was referred by Futures trader for assessment of psychosocial needs.  Clinical Social Worker contacted patient by phone to offer support and assess for needs.  Ms. Arbaugh reported she recently lost her job and is having difficulty working due to medical appointments and inability to perform the tasks required due to cancer treatment side effects.  CSW will refer to [facility] for disability assistance.  CSW and patient plan to meet at next cancer center visit to identify other possible resources.    Polo Riley, MSW, LCSW, OSW-C Clinical Social Worker [facility] [phone]

## 2016-08-16 ENCOUNTER — Ambulatory Visit (HOSPITAL_BASED_OUTPATIENT_CLINIC_OR_DEPARTMENT_OTHER): Payer: BLUE CROSS/BLUE SHIELD | Admitting: Internal Medicine

## 2016-08-16 ENCOUNTER — Ambulatory Visit (HOSPITAL_BASED_OUTPATIENT_CLINIC_OR_DEPARTMENT_OTHER): Payer: BLUE CROSS/BLUE SHIELD

## 2016-08-16 ENCOUNTER — Other Ambulatory Visit (HOSPITAL_BASED_OUTPATIENT_CLINIC_OR_DEPARTMENT_OTHER): Payer: BLUE CROSS/BLUE SHIELD

## 2016-08-16 ENCOUNTER — Encounter: Payer: Self-pay | Admitting: Internal Medicine

## 2016-08-16 VITALS — BP 131/76 | HR 64 | Temp 98.6°F | Resp 18 | Ht 64.0 in | Wt 139.2 lb

## 2016-08-16 DIAGNOSIS — E039 Hypothyroidism, unspecified: Secondary | ICD-10-CM

## 2016-08-16 DIAGNOSIS — Z5112 Encounter for antineoplastic immunotherapy: Secondary | ICD-10-CM

## 2016-08-16 DIAGNOSIS — C3412 Malignant neoplasm of upper lobe, left bronchus or lung: Secondary | ICD-10-CM

## 2016-08-16 DIAGNOSIS — C3432 Malignant neoplasm of lower lobe, left bronchus or lung: Secondary | ICD-10-CM | POA: Diagnosis not present

## 2016-08-16 LAB — TSH: TSH: 1.758 m(IU)/L (ref 0.308–3.960)

## 2016-08-16 LAB — CBC WITH DIFFERENTIAL/PLATELET
BASO%: 0.7 % (ref 0.0–2.0)
Basophils Absolute: 0.1 10*3/uL (ref 0.0–0.1)
EOS%: 1.2 % (ref 0.0–7.0)
Eosinophils Absolute: 0.1 10*3/uL (ref 0.0–0.5)
HCT: 45.7 % (ref 34.8–46.6)
HGB: 15.7 g/dL (ref 11.6–15.9)
LYMPH%: 25.1 % (ref 14.0–49.7)
MCH: 33.6 pg (ref 25.1–34.0)
MCHC: 34.4 g/dL (ref 31.5–36.0)
MCV: 97.7 fL (ref 79.5–101.0)
MONO#: 0.5 10*3/uL (ref 0.1–0.9)
MONO%: 6.8 % (ref 0.0–14.0)
NEUT%: 66.2 % (ref 38.4–76.8)
NEUTROS ABS: 5.3 10*3/uL (ref 1.5–6.5)
PLATELETS: 206 10*3/uL (ref 145–400)
RBC: 4.68 10*6/uL (ref 3.70–5.45)
RDW: 14.1 % (ref 11.2–14.5)
WBC: 8 10*3/uL (ref 3.9–10.3)
lymph#: 2 10*3/uL (ref 0.9–3.3)

## 2016-08-16 LAB — COMPREHENSIVE METABOLIC PANEL
ALT: 13 U/L (ref 0–55)
AST: 18 U/L (ref 5–34)
Albumin: 3.7 g/dL (ref 3.5–5.0)
Alkaline Phosphatase: 85 U/L (ref 40–150)
Anion Gap: 10 mEq/L (ref 3–11)
BILIRUBIN TOTAL: 0.42 mg/dL (ref 0.20–1.20)
BUN: 10.2 mg/dL (ref 7.0–26.0)
CO2: 22 meq/L (ref 22–29)
CREATININE: 0.8 mg/dL (ref 0.6–1.1)
Calcium: 9.6 mg/dL (ref 8.4–10.4)
Chloride: 107 mEq/L (ref 98–109)
EGFR: 85 mL/min/{1.73_m2} — ABNORMAL LOW (ref >90)
GLUCOSE: 104 mg/dL (ref 70–140)
Potassium: 3.9 mEq/L (ref 3.5–5.1)
SODIUM: 139 meq/L (ref 136–145)
TOTAL PROTEIN: 7.4 g/dL (ref 6.4–8.3)

## 2016-08-16 MED ORDER — LEVOTHYROXINE SODIUM 125 MCG PO TABS: 125.0000 ug | ORAL_TABLET | Freq: Every day | ORAL | 2 refills | Status: DC

## 2016-08-16 MED ORDER — SODIUM CHLORIDE 0.9 % IV SOLN
Freq: Once | INTRAVENOUS | Status: AC
  Administered 2016-08-16: 12:00:00 via INTRAVENOUS

## 2016-08-16 MED ORDER — SODIUM CHLORIDE 0.9 % IV SOLN
200.0000 mg | Freq: Once | INTRAVENOUS | Status: AC
  Administered 2016-08-16: 200 mg via INTRAVENOUS
  Filled 2016-08-16: qty 8

## 2016-08-16 NOTE — Patient Instructions (Signed)
[facility] Discharge Instructions for Patients Receiving Chemotherapy  Today you received the following chemotherapy agents:  Keytruda.  To help prevent nausea and vomiting after your treatment, we encourage you to take your nausea medication as prescribed.   If you develop nausea and vomiting that is not controlled by your nausea medication, call the clinic.   BELOW ARE SYMPTOMS THAT SHOULD BE REPORTED IMMEDIATELY:  *FEVER GREATER THAN 100.5 F  *CHILLS WITH OR WITHOUT FEVER  NAUSEA AND VOMITING THAT IS NOT CONTROLLED WITH YOUR NAUSEA MEDICATION  *UNUSUAL SHORTNESS OF BREATH  *UNUSUAL BRUISING OR BLEEDING  TENDERNESS IN MOUTH AND THROAT WITH OR WITHOUT PRESENCE OF ULCERS  *URINARY PROBLEMS  *BOWEL PROBLEMS  UNUSUAL RASH Items with * indicate a potential emergency and should be followed up as soon as possible.  Feel free to call the clinic you have any questions or concerns. The clinic phone number is [phone].  Please show the CHEMO ALERT CARD at check-in to the Emergency Department and triage nurse.   

## 2016-08-16 NOTE — Progress Notes (Signed)
[name] [phone]   Fax:(336) [phone]  OFFICE PROGRESS NOTE  [name], NP [address] [name] [address]  DIAGNOSIS: Recurrent non-small cell lung cancer, adenocarcinoma with PDL 1 expression of 60% diagnosed in November 2016. The patient was initially diagnosed as stage IB (T2a, N0, M0) adenocarcinoma in March 2016.  PRIOR THERAPY:  Status post left video-assisted thoracoscopy, wedge resection left lower lobe, thoracoscopic left lower lobectomy and mediastinal lymph node dissection on 08/14/2014.   CURRENT THERAPY: Immunotherapy with Ketruda (pembrolizumab) 200 mg IV every 3 weeks status post 22 cycles. First dose was given 05/12/2015.   INTERVAL HISTORY: Sharon [name] 56 y.o. female came to the clinic today for follow-up visit accompanied by her daughter. The patient is feeling fine today with no specific complaints. She was fired from her job recently and she is working on disability. She denied having any fever or chills. She denied having any chest pain, shortness of breath, cough or hemoptysis. She denied having any weight loss or night sweats. She has no nausea, vomiting, diarrhea or constipation. She is tolerating her current treatment with immunotherapy fairly well. She is here for evaluation before starting cycle #23.   MEDICAL HISTORY: Past Medical History:  Diagnosis Date  . Acute ST segment elevation MI ([place])   . Aortic insufficiency    moderate by echo in 2009  . Arthritis   . Cancer (HCC)    LUNGS  . COPD (chronic obstructive pulmonary disease) ([place])   . Coronary artery disease   . Emphysema of lung ([place])   . Hyperlipidemia   . Ischemic cardiomyopathy   . Neoplasm of uncertain behavior of left lower lobe of lung    per CT CHEST/PET 2/4 and 07/18/14 @ [facility]  . Occlusion of right coronary artery ([place])   . Pneumonia    2015   HX BRONCHITIS    . S/P coronary artery stent placement    2009  . Shortness of breath  dyspnea    WITH EXEERTION   . Tobacco user     ALLERGIES:  is allergic to codeine; lipitor [atorvastatin calcium]; morphine and related; and oxycodone-acetaminophen.  MEDICATIONS:  Current Outpatient Prescriptions  Medication Sig Dispense Refill  . ALPRAZolam (XANAX) 0.25 MG tablet Take 0.25 mg by mouth daily.  0  . aspirin (ASPIR-LOW) 81 MG EC tablet Take 81 mg by mouth daily. On hold    . clopidogrel (PLAVIX) 75 MG tablet Take 1 tablet (75 mg total) by mouth daily. 90 tablet 3  . levothyroxine (SYNTHROID, LEVOTHROID) 125 MCG tablet Take 1 tablet (125 mcg total) by mouth daily before breakfast. 30 tablet 2  . ondansetron (ZOFRAN) 8 MG tablet TAKE 1 TABLET (8 MG TOTAL) BY MOUTH EVERY 8 (EIGHT) HOURS AS NEEDED FOR NAUSEA OR VOMITING. 30 tablet 0  . potassium chloride (MICRO-K) 10 MEQ CR capsule Take 10 mEq by mouth 2 (two) times daily.  0  . rosuvastatin (CRESTOR) 20 MG tablet Take 20 mg by mouth daily.  0  . traMADol (ULTRAM) 50 MG tablet Take 1 tablet (50 mg total) by mouth every 6 (six) hours as needed. 60 tablet 0   No current facility-administered medications for this visit.     SURGICAL HISTORY:  Past Surgical History:  Procedure Laterality Date  . CARDIAC CATHETERIZATION  10/15/2007   EF 30-40%  . CESAREAN SECTION    . CHOLECYSTECTOMY    . CORONARY ANGIOPLASTY WITH STENT PLACEMENT  LAD  . ectopic pregnancies requiring laparotomies    . LEAD REMOVAL Left 08/14/2014   Procedure: CRYO INTERCOSTAL NERVE BLOCK;  Surgeon: Melrose Nakayama, MD;  Location: [facility];  Service: Thoracic;  Laterality: Left;  . LOBECTOMY Left 08/14/2014   Procedure: LEFT LOWER LOBECTOMY;  Surgeon: Melrose Nakayama, MD;  Location: [facility];  Service: Thoracic;  Laterality: Left;  . NODE DISSECTION Left 08/14/2014   Procedure: NODE DISSECTION, LEFT LOWER LOBE LUNG;  Surgeon: Melrose Nakayama, MD;  Location: [facility];  Service: Thoracic;  Laterality: Left;  . SHOULDER SURGERY     LEFT X 2   (REMOVED  SOME COLLAR BONE )  . TRANSTHORACIC ECHOCARDIOGRAM  10/17/2007  . TUBAL LIGATION    . US ECHOCARDIOGRAPHY  01/07/2008   EF 55-60%  . VIDEO ASSISTED THORACOSCOPY (VATS)/WEDGE RESECTION Left 08/14/2014   Procedure: VIDEO ASSISTED THORACOSCOPY (VATS)/WEDGE RESECTION;  Surgeon: Melrose Nakayama, MD;  Location: [facility];  Service: Thoracic;  Laterality: Left;    REVIEW OF SYSTEMS:  A comprehensive review of systems was negative.   PHYSICAL EXAMINATION: General appearance: alert, cooperative and no distress Head: Normocephalic, without obvious abnormality, atraumatic Neck: no adenopathy, no JVD, supple, symmetrical, trachea midline and thyroid not enlarged, symmetric, no tenderness/mass/nodules Lymph nodes: Cervical, supraclavicular, and axillary nodes normal. Resp: clear to auscultation bilaterally Back: negative Cardio: regular rate and rhythm, S1, S2 normal, no murmur, click, rub or gallop GI: soft, non-tender; bowel sounds normal; no masses,  no organomegaly Extremities: extremities normal, atraumatic, no cyanosis or edema  ECOG PERFORMANCE STATUS: 1 - Symptomatic but completely ambulatory  Blood pressure 131/76, pulse 64, temperature 98.6 F (37 C), temperature source Oral, resp. rate 18, height '5\' 4"'$  (1.626 m), weight 139 lb 3.2 oz (63.1 kg), SpO2 97 %.  LABORATORY DATA: Lab Results  Component Value Date   WBC 8.0 08/16/2016   HGB 15.7 08/16/2016   HCT 45.7 08/16/2016   MCV 97.7 08/16/2016   PLT 206 08/16/2016      Chemistry      Component Value Date/Time   NA 139 07/26/2016 1039   K 3.5 07/26/2016 1039   CL 109 08/16/2014 0520   CO2 25 07/26/2016 1039   BUN 9.2 07/26/2016 1039   CREATININE 0.8 07/26/2016 1039      Component Value Date/Time   CALCIUM 9.4 07/26/2016 1039   ALKPHOS 83 07/26/2016 1039   AST 18 07/26/2016 1039   ALT 13 07/26/2016 1039   BILITOT 0.39 07/26/2016 1039       RADIOGRAPHIC STUDIES: No results found.  ASSESSMENT AND PLAN:  This is a  very pleasant 56 years old white female with recurrent non-small cell lung cancer, adenocarcinoma with PDL 1 expression of 60%. The patient is undergoing treatment with immunotherapy with Ketruda (pembrolizumab) every 3 weeks status post 22 cycles. She is tolerating her treatment well with no significant adverse effects. I recommended for her to proceed with cycle #23 today as scheduled. She would come back for follow-up visit in 3 weeks for reevaluation with repeat CT scan of the chest, abdomen and pelvis for restaging of her disease. She was advised to call immediately if she has any concerning symptoms in the interval. The patient voices understanding of current disease status and treatment options and is in agreement with the current care plan.  All questions were answered. The patient knows to call the clinic with any problems, questions or concerns. We can certainly see the patient much sooner if necessary. I  spent 10 minutes counseling the patient face to face. The total time spent in the appointment was 15 minutes.  Disclaimer: This note was dictated with voice recognition software. Similar sounding words can inadvertently be transcribed and may not be corrected upon review.

## 2016-08-24 ENCOUNTER — Telehealth: Payer: Self-pay | Admitting: Internal Medicine

## 2016-08-24 NOTE — Telephone Encounter (Signed)
Per 3/20 - appts already scheduled no additional appts needed

## 2016-09-05 ENCOUNTER — Ambulatory Visit (HOSPITAL_COMMUNITY)
Admission: RE | Admit: 2016-09-05 | Discharge: 2016-09-05 | Disposition: A | Discharge status: Home / Self Care | Payer: BLUE CROSS/BLUE SHIELD | Source: Ambulatory Visit | Attending: Internal Medicine | Admitting: Internal Medicine

## 2016-09-05 ENCOUNTER — Encounter (HOSPITAL_COMMUNITY): Payer: Self-pay

## 2016-09-05 DIAGNOSIS — C3432 Malignant neoplasm of lower lobe, left bronchus or lung: Secondary | ICD-10-CM | POA: Diagnosis present

## 2016-09-05 DIAGNOSIS — Z5112 Encounter for antineoplastic immunotherapy: Secondary | ICD-10-CM | POA: Insufficient documentation

## 2016-09-05 DIAGNOSIS — E039 Hypothyroidism, unspecified: Secondary | ICD-10-CM | POA: Diagnosis not present

## 2016-09-05 DIAGNOSIS — J439 Emphysema, unspecified: Secondary | ICD-10-CM | POA: Insufficient documentation

## 2016-09-05 DIAGNOSIS — C3412 Malignant neoplasm of upper lobe, left bronchus or lung: Secondary | ICD-10-CM | POA: Diagnosis present

## 2016-09-05 MED ORDER — IOPAMIDOL (ISOVUE-300) INJECTION 61%
INTRAVENOUS | Status: AC
  Filled 2016-09-05: qty 100

## 2016-09-05 MED ORDER — IOPAMIDOL (ISOVUE-300) INJECTION 61%
30.0000 mL | Freq: Once | INTRAVENOUS | Status: DC | PRN
Start: 2016-09-05 — End: 2016-09-06
  Administered 2016-09-05: 30 mL via ORAL
  Filled 2016-09-05: qty 30

## 2016-09-05 MED ORDER — IOPAMIDOL (ISOVUE-300) INJECTION 61%
INTRAVENOUS | Status: AC
  Filled 2016-09-05: qty 30

## 2016-09-05 MED ORDER — IOPAMIDOL (ISOVUE-300) INJECTION 61%
100.0000 mL | Freq: Once | INTRAVENOUS | Status: AC | PRN
  Administered 2016-09-05: 100 mL via INTRAVENOUS

## 2016-09-06 ENCOUNTER — Ambulatory Visit (HOSPITAL_BASED_OUTPATIENT_CLINIC_OR_DEPARTMENT_OTHER): Payer: BLUE CROSS/BLUE SHIELD | Admitting: Internal Medicine

## 2016-09-06 ENCOUNTER — Encounter: Payer: Self-pay | Admitting: Internal Medicine

## 2016-09-06 ENCOUNTER — Other Ambulatory Visit (HOSPITAL_BASED_OUTPATIENT_CLINIC_OR_DEPARTMENT_OTHER): Payer: BLUE CROSS/BLUE SHIELD

## 2016-09-06 ENCOUNTER — Ambulatory Visit (HOSPITAL_BASED_OUTPATIENT_CLINIC_OR_DEPARTMENT_OTHER): Payer: BLUE CROSS/BLUE SHIELD

## 2016-09-06 ENCOUNTER — Telehealth: Payer: Self-pay | Admitting: Internal Medicine

## 2016-09-06 VITALS — BP 131/77 | HR 68 | Temp 97.8°F | Resp 18 | Ht 64.0 in | Wt 140.7 lb

## 2016-09-06 DIAGNOSIS — E039 Hypothyroidism, unspecified: Secondary | ICD-10-CM | POA: Diagnosis not present

## 2016-09-06 DIAGNOSIS — C3432 Malignant neoplasm of lower lobe, left bronchus or lung: Secondary | ICD-10-CM

## 2016-09-06 DIAGNOSIS — R51 Headache: Secondary | ICD-10-CM

## 2016-09-06 DIAGNOSIS — C3412 Malignant neoplasm of upper lobe, left bronchus or lung: Secondary | ICD-10-CM

## 2016-09-06 DIAGNOSIS — Z5112 Encounter for antineoplastic immunotherapy: Secondary | ICD-10-CM | POA: Diagnosis not present

## 2016-09-06 DIAGNOSIS — I2510 Atherosclerotic heart disease of native coronary artery without angina pectoris: Secondary | ICD-10-CM | POA: Diagnosis not present

## 2016-09-06 DIAGNOSIS — R519 Headache, unspecified: Secondary | ICD-10-CM

## 2016-09-06 DIAGNOSIS — G4452 New daily persistent headache (NDPH): Secondary | ICD-10-CM

## 2016-09-06 HISTORY — DX: Headache, unspecified: R51.9

## 2016-09-06 LAB — COMPREHENSIVE METABOLIC PANEL
ALBUMIN: 3.6 g/dL (ref 3.5–5.0)
ALK PHOS: 86 U/L (ref 40–150)
ALT: 13 U/L (ref 0–55)
ANION GAP: 10 meq/L (ref 3–11)
AST: 18 U/L (ref 5–34)
BUN: 9.2 mg/dL (ref 7.0–26.0)
CHLORIDE: 109 meq/L (ref 98–109)
CO2: 21 mEq/L — ABNORMAL LOW (ref 22–29)
CREATININE: 0.7 mg/dL (ref 0.6–1.1)
Calcium: 9.6 mg/dL (ref 8.4–10.4)
EGFR: >90 mL/min/{1.73_m2} (ref >90)
Glucose: 92 mg/dl (ref 70–140)
POTASSIUM: 3.8 meq/L (ref 3.5–5.1)
Sodium: 140 mEq/L (ref 136–145)
Total Bilirubin: 0.39 mg/dL (ref 0.20–1.20)
Total Protein: 7.2 g/dL (ref 6.4–8.3)

## 2016-09-06 LAB — CBC WITH DIFFERENTIAL/PLATELET
BASO%: 0.2 % (ref 0.0–2.0)
BASOS ABS: 0 10*3/uL (ref 0.0–0.1)
EOS ABS: 0.1 10*3/uL (ref 0.0–0.5)
EOS%: 0.8 % (ref 0.0–7.0)
HEMATOCRIT: 43.7 % (ref 34.8–46.6)
HEMOGLOBIN: 15 g/dL (ref 11.6–15.9)
LYMPH#: 2 10*3/uL (ref 0.9–3.3)
LYMPH%: 18.3 % (ref 14.0–49.7)
MCH: 33.3 pg (ref 25.1–34.0)
MCHC: 34.3 g/dL (ref 31.5–36.0)
MCV: 96.9 fL (ref 79.5–101.0)
MONO#: 0.6 10*3/uL (ref 0.1–0.9)
MONO%: 5.5 % (ref 0.0–14.0)
NEUT#: 8.1 10*3/uL — ABNORMAL HIGH (ref 1.5–6.5)
NEUT%: 75.2 % (ref 38.4–76.8)
PLATELETS: 235 10*3/uL (ref 145–400)
RBC: 4.51 10*6/uL (ref 3.70–5.45)
RDW: 14 % (ref 11.2–14.5)
WBC: 10.8 10*3/uL — ABNORMAL HIGH (ref 3.9–10.3)

## 2016-09-06 MED ORDER — SODIUM CHLORIDE 0.9 % IV SOLN
200.0000 mg | Freq: Once | INTRAVENOUS | Status: AC
  Administered 2016-09-06: 200 mg via INTRAVENOUS
  Filled 2016-09-06: qty 8

## 2016-09-06 MED ORDER — SODIUM CHLORIDE 0.9 % IV SOLN
Freq: Once | INTRAVENOUS | Status: AC
  Administered 2016-09-06: 12:00:00 via INTRAVENOUS

## 2016-09-06 NOTE — Progress Notes (Signed)
Sharon [name] [phone]   [phone]  OFFICE PROGRESS NOTE  [name], NP [address] [place] [address]  DIAGNOSIS: Recurrent non-small cell lung cancer, adenocarcinoma with PDL 1 expression of 60% diagnosed in November 2016. The patient was initially diagnosed as stage IB (T2a, N0, M0) adenocarcinoma in March 2016.  PRIOR THERAPY:  Status post left video-assisted thoracoscopy, wedge resection left lower lobe, thoracoscopic left lower lobectomy and mediastinal lymph node dissection on 08/14/2014.   CURRENT THERAPY: Immunotherapy with Ketruda (pembrolizumab) 200 mg IV every 3 weeks status post 23 cycles. First dose was given 05/12/2015.   INTERVAL HISTORY: Sharon EHRHARD 56 y.o. female returns to the clinic today for follow-up visit accompanied by her daughter Janett Billow. The patient is feeling fine today with no specific complaints except for new headache started yesterday. It is persistent and located behind her right eye. She denied having any recent chest pain, shortness of breath, cough or hemoptysis. She has no fever or chills. She denied having any nausea, vomiting, diarrhea or constipation. She gained few pounds since her last visit. She is tolerating her current treatment with [place] (pembrolizumab) fairly well. She had repeat CT scan of the chest, abdomen and pelvis performed recently and she is here for evaluation and discussion of her scan results.   MEDICAL HISTORY: Past Medical History:  Diagnosis Date  . Acute ST segment elevation MI ([facility])   . Aortic insufficiency    moderate by echo in 2009  . Arthritis   . Cancer (HCC)    LUNGS  . COPD (chronic obstructive pulmonary disease) ([place])   . Coronary artery disease   . Emphysema of lung ([place])   . Hyperlipidemia   . Ischemic cardiomyopathy   . Neoplasm of uncertain behavior of left lower lobe of lung    per CT CHEST/PET 2/4 and 07/18/14 @ [facility]  . Occlusion of right coronary  artery ([place])   . Pneumonia    2015   HX BRONCHITIS    . S/P coronary artery stent placement    2009  . Shortness of breath dyspnea    WITH EXEERTION   . Tobacco user     ALLERGIES:  is allergic to codeine; lipitor [atorvastatin calcium]; morphine and related; and oxycodone-acetaminophen.  MEDICATIONS:  Current Outpatient Prescriptions  Medication Sig Dispense Refill  . ALPRAZolam (XANAX) 0.25 MG tablet Take 0.25 mg by mouth daily.  0  . aspirin (ASPIR-LOW) 81 MG EC tablet Take 81 mg by mouth daily. On hold    . clopidogrel (PLAVIX) 75 MG tablet Take 1 tablet (75 mg total) by mouth daily. 90 tablet 3  . levothyroxine (SYNTHROID, LEVOTHROID) 125 MCG tablet Take 1 tablet (125 mcg total) by mouth daily before breakfast. 30 tablet 2  . ondansetron (ZOFRAN) 8 MG tablet TAKE 1 TABLET (8 MG TOTAL) BY MOUTH EVERY 8 (EIGHT) HOURS AS NEEDED FOR NAUSEA OR VOMITING. 30 tablet 0  . potassium chloride (MICRO-K) 10 MEQ CR capsule Take 10 mEq by mouth 2 (two) times daily.  0  . rosuvastatin (CRESTOR) 20 MG tablet Take 20 mg by mouth daily.  0  . traMADol (ULTRAM) 50 MG tablet Take 1 tablet (50 mg total) by mouth every 6 (six) hours as needed. 60 tablet 0   No current facility-administered medications for this visit.     SURGICAL HISTORY:  Past Surgical History:  Procedure Laterality Date  . CARDIAC CATHETERIZATION  10/15/2007   EF 30-40%  .  CESAREAN SECTION    . CHOLECYSTECTOMY    . CORONARY ANGIOPLASTY WITH STENT PLACEMENT     LAD  . ectopic pregnancies requiring laparotomies    . LEAD REMOVAL Left 08/14/2014   Procedure: CRYO INTERCOSTAL NERVE BLOCK;  Surgeon: Melrose Nakayama, MD;  Location: [facility];  Service: Thoracic;  Laterality: Left;  . LOBECTOMY Left 08/14/2014   Procedure: LEFT LOWER LOBECTOMY;  Surgeon: Melrose Nakayama, MD;  Location: [place];  Service: Thoracic;  Laterality: Left;  . NODE DISSECTION Left 08/14/2014   Procedure: NODE DISSECTION, LEFT LOWER LOBE LUNG;  Surgeon:  Melrose Nakayama, MD;  Location: [facility];  Service: Thoracic;  Laterality: Left;  . SHOULDER SURGERY     LEFT X 2   (REMOVED SOME COLLAR BONE )  . TRANSTHORACIC ECHOCARDIOGRAM  10/17/2007  . TUBAL LIGATION    . US ECHOCARDIOGRAPHY  01/07/2008   EF 55-60%  . VIDEO ASSISTED THORACOSCOPY (VATS)/WEDGE RESECTION Left 08/14/2014   Procedure: VIDEO ASSISTED THORACOSCOPY (VATS)/WEDGE RESECTION;  Surgeon: Melrose Nakayama, MD;  Location: [facility];  Service: Thoracic;  Laterality: Left;    REVIEW OF SYSTEMS:  Constitutional: negative Eyes: negative Ears, nose, mouth, throat, and face: negative Respiratory: negative Cardiovascular: negative Gastrointestinal: negative Genitourinary:negative Integument/breast: negative Hematologic/lymphatic: negative Musculoskeletal:negative Neurological: positive for headaches Behavioral/Psych: negative Endocrine: negative Allergic/Immunologic: negative   PHYSICAL EXAMINATION: General appearance: alert, cooperative and no distress Head: Normocephalic, without obvious abnormality, atraumatic Neck: no adenopathy, no JVD, supple, symmetrical, trachea midline and thyroid not enlarged, symmetric, no tenderness/mass/nodules Lymph nodes: Cervical, supraclavicular, and axillary nodes normal. Resp: clear to auscultation bilaterally Back: negative Cardio: regular rate and rhythm, S1, S2 normal, no murmur, click, rub or gallop GI: soft, non-tender; bowel sounds normal; no masses,  no organomegaly Extremities: extremities normal, atraumatic, no cyanosis or edema Neurologic: Alert and oriented X 3, normal strength and tone. Normal symmetric reflexes. Normal coordination and gait  ECOG PERFORMANCE STATUS: 1 - Symptomatic but completely ambulatory  Blood pressure 131/77, pulse 68, temperature 97.8 F (36.6 C), temperature source Oral, resp. rate 18, height '5\' 4"'$  (1.626 m), weight 140 lb 11.2 oz (63.8 kg), SpO2 100 %.  LABORATORY DATA: Lab Results  Component Value  Date   WBC 10.8 (H) 09/06/2016   HGB 15.0 09/06/2016   HCT 43.7 09/06/2016   MCV 96.9 09/06/2016   PLT 235 09/06/2016      Chemistry      Component Value Date/Time   NA 139 08/16/2016 1001   K 3.9 08/16/2016 1001   CL 109 08/16/2014 0520   CO2 22 08/16/2016 1001   BUN 10.2 08/16/2016 1001   CREATININE 0.8 08/16/2016 1001      Component Value Date/Time   CALCIUM 9.6 08/16/2016 1001   ALKPHOS 85 08/16/2016 1001   AST 18 08/16/2016 1001   ALT 13 08/16/2016 1001   BILITOT 0.42 08/16/2016 1001       RADIOGRAPHIC STUDIES: Ct Chest W Contrast  Result Date: 09/05/2016 CLINICAL DATA:  56 year old female with history of left-sided lung cancer diagnosed in February 2016 status post resection, currently undergoing ongoing immunotherapy with Keytruda. EXAM: CT CHEST, ABDOMEN, AND PELVIS WITH CONTRAST TECHNIQUE: Multidetector CT imaging of the chest, abdomen and pelvis was performed following the standard protocol during bolus administration of intravenous contrast. CONTRAST:  163m ISOVUE-300 IOPAMIDOL (ISOVUE-300) INJECTION 61% COMPARISON:  CT of the chest, abdomen and pelvis 06/28/2016. FINDINGS: CT CHEST FINDINGS Cardiovascular: Heart size is borderline enlarged with some mild rounding of the left ventricular  apex, likely related to prior distal LAD territory myocardial infarction. There is aortic atherosclerosis, as well as atherosclerosis of the great vessels of the mediastinum and the coronary arteries, including calcified atherosclerotic plaque in the left anterior descending and left circumflex coronary arteries. There is no significant pericardial fluid, thickening or pericardial calcification. Thickening calcification of the aortic valve. Incidental imaging of the valve during diastole appears to demonstrate a central region of malcoaptation, suggestive of probable aortic insufficiency. Mediastinum/Nodes: Previously noted prevascular lymph node appears essentially stable when compared to  prior study measuring 11 mm (previously 10 mm). Previously noted right paratracheal lymph node measures 8 mm in short axis, decreased compared to the prior study. No new mediastinal or hilar lymphadenopathy is noted. Esophagus is unremarkable in appearance. No axillary lymphadenopathy. Lungs/Pleura: Status post left lower lobectomy. Compensatory hyperexpansion of the left upper lobe. Postoperative changes of wedge resection in the superior a medial aspect of the left upper lobe are also noted. 3 mm left upper lobe pulmonary nodule (image 39 of series 4) is unchanged. 3 mm ground-glass attenuation nodule in the left upper lobe (image 30 of series 4) is unchanged. Nodular area of scarring in the inferior aspect of the left upper lobe near the base of the left hemithorax is unchanged. No other new suspicious appearing pulmonary nodules or masses are noted. Chronic scarring in the medial segment of the right middle lobe is unchanged. Mild diffuse bronchial wall thickening with mild centrilobular and paraseptal emphysema. No acute consolidative airspace disease. No pleural effusions. Musculoskeletal: Chronic appearing compression fracture of T9 with approximately 25% loss of anterior vertebral body height is unchanged. There are no aggressive appearing lytic or blastic lesions noted in the visualized portions of the skeleton. CT ABDOMEN PELVIS FINDINGS Hepatobiliary: No suspicious cystic or solid hepatic lesions. No intra or extrahepatic biliary ductal dilatation. Status post cholecystectomy. Pancreas: No pancreatic mass. No pancreatic ductal dilatation. No pancreatic or peripancreatic fluid or inflammatory changes. Spleen: Unremarkable. Adrenals/Urinary Tract: Bilateral kidneys and bilateral adrenal glands are normal in appearance. No hydroureteronephrosis. Urinary bladder is normal in appearance. Stomach/Bowel: The appearance of the stomach is normal. There is no pathologic dilatation of small bowel or colon. Normal  appendix. Vascular/Lymphatic: Extensive aortic atherosclerosis, without evidence of aneurysm or dissection in the abdominal or pelvic vasculature. No lymphadenopathy noted in the abdomen or pelvis. Reproductive: Uterus and ovaries are unremarkable in appearance. Other: No significant volume of ascites.  No pneumoperitoneum. Musculoskeletal: There are no aggressive appearing lytic or blastic lesions noted in the visualized portions of the skeleton. IMPRESSION: 1. Stable examination, without evidence to suggest local recurrence of disease or new metastatic disease in the chest, abdomen or pelvis. Previously noted mediastinal lymphadenopathy is essentially stable to slightly improved compared to the prior examination, as detailed above. 2. Mild diffuse bronchial wall thickening with mild centrilobular and paraseptal emphysema; imaging findings suggestive of underlying COPD. 3. Tiny left upper lobe pulmonary nodules and area of chronic scarring in the inferior aspect of the left lower lobe are stable compared to the prior examinations, favored to be benign. 4. Aortic atherosclerosis, in addition to 2 vessel coronary artery disease. Please note that although the presence of coronary artery calcium documents the presence of coronary artery disease, the severity of this disease and any potential stenosis cannot be assessed on this non-gated CT examination, however, there is evidence of prior distal LAD territory myocardial infarction. Assessment for potential risk factor modification, dietary therapy or pharmacologic therapy may be warranted, if clinically indicated.  5. There are calcifications of the aortic valve. In addition, incidental diastolic imaging of the aortic valve suggests underlying aortic insufficiency. Echocardiographic correlation for evaluation of potential valvular dysfunction may be warranted if clinically indicated. 6. Additional incidental findings, as above. Electronically Signed   By: Vinnie Langton M.D.   On: 09/05/2016 09:39   Ct Abdomen Pelvis W Contrast  Result Date: 09/05/2016 CLINICAL DATA:  56 year old female with history of left-sided lung cancer diagnosed in February 2016 status post resection, currently undergoing ongoing immunotherapy with Keytruda. EXAM: CT CHEST, ABDOMEN, AND PELVIS WITH CONTRAST TECHNIQUE: Multidetector CT imaging of the chest, abdomen and pelvis was performed following the standard protocol during bolus administration of intravenous contrast. CONTRAST:  192m ISOVUE-300 IOPAMIDOL (ISOVUE-300) INJECTION 61% COMPARISON:  CT of the chest, abdomen and pelvis 06/28/2016. FINDINGS: CT CHEST FINDINGS Cardiovascular: Heart size is borderline enlarged with some mild rounding of the left ventricular apex, likely related to prior distal LAD territory myocardial infarction. There is aortic atherosclerosis, as well as atherosclerosis of the great vessels of the mediastinum and the coronary arteries, including calcified atherosclerotic plaque in the left anterior descending and left circumflex coronary arteries. There is no significant pericardial fluid, thickening or pericardial calcification. Thickening calcification of the aortic valve. Incidental imaging of the valve during diastole appears to demonstrate a central region of malcoaptation, suggestive of probable aortic insufficiency. Mediastinum/Nodes: Previously noted prevascular lymph node appears essentially stable when compared to prior study measuring 11 mm (previously 10 mm). Previously noted right paratracheal lymph node measures 8 mm in short axis, decreased compared to the prior study. No new mediastinal or hilar lymphadenopathy is noted. Esophagus is unremarkable in appearance. No axillary lymphadenopathy. Lungs/Pleura: Status post left lower lobectomy. Compensatory hyperexpansion of the left upper lobe. Postoperative changes of wedge resection in the superior a medial aspect of the left upper lobe are also noted. 3  mm left upper lobe pulmonary nodule (image 39 of series 4) is unchanged. 3 mm ground-glass attenuation nodule in the left upper lobe (image 30 of series 4) is unchanged. Nodular area of scarring in the inferior aspect of the left upper lobe near the base of the left hemithorax is unchanged. No other new suspicious appearing pulmonary nodules or masses are noted. Chronic scarring in the medial segment of the right middle lobe is unchanged. Mild diffuse bronchial wall thickening with mild centrilobular and paraseptal emphysema. No acute consolidative airspace disease. No pleural effusions. Musculoskeletal: Chronic appearing compression fracture of T9 with approximately 25% loss of anterior vertebral body height is unchanged. There are no aggressive appearing lytic or blastic lesions noted in the visualized portions of the skeleton. CT ABDOMEN PELVIS FINDINGS Hepatobiliary: No suspicious cystic or solid hepatic lesions. No intra or extrahepatic biliary ductal dilatation. Status post cholecystectomy. Pancreas: No pancreatic mass. No pancreatic ductal dilatation. No pancreatic or peripancreatic fluid or inflammatory changes. Spleen: Unremarkable. Adrenals/Urinary Tract: Bilateral kidneys and bilateral adrenal glands are normal in appearance. No hydroureteronephrosis. Urinary bladder is normal in appearance. Stomach/Bowel: The appearance of the stomach is normal. There is no pathologic dilatation of small bowel or colon. Normal appendix. Vascular/Lymphatic: Extensive aortic atherosclerosis, without evidence of aneurysm or dissection in the abdominal or pelvic vasculature. No lymphadenopathy noted in the abdomen or pelvis. Reproductive: Uterus and ovaries are unremarkable in appearance. Other: No significant volume of ascites.  No pneumoperitoneum. Musculoskeletal: There are no aggressive appearing lytic or blastic lesions noted in the visualized portions of the skeleton. IMPRESSION: 1. Stable examination, without evidence  to suggest local recurrence of disease or new metastatic disease in the chest, abdomen or pelvis. Previously noted mediastinal lymphadenopathy is essentially stable to slightly improved compared to the prior examination, as detailed above. 2. Mild diffuse bronchial wall thickening with mild centrilobular and paraseptal emphysema; imaging findings suggestive of underlying COPD. 3. Tiny left upper lobe pulmonary nodules and area of chronic scarring in the inferior aspect of the left lower lobe are stable compared to the prior examinations, favored to be benign. 4. Aortic atherosclerosis, in addition to 2 vessel coronary artery disease. Please note that although the presence of coronary artery calcium documents the presence of coronary artery disease, the severity of this disease and any potential stenosis cannot be assessed on this non-gated CT examination, however, there is evidence of prior distal LAD territory myocardial infarction. Assessment for potential risk factor modification, dietary therapy or pharmacologic therapy may be warranted, if clinically indicated. 5. There are calcifications of the aortic valve. In addition, incidental diastolic imaging of the aortic valve suggests underlying aortic insufficiency. Echocardiographic correlation for evaluation of potential valvular dysfunction may be warranted if clinically indicated. 6. Additional incidental findings, as above. Electronically Signed   By: Vinnie Langton M.D.   On: 09/05/2016 09:39    ASSESSMENT AND PLAN:  This is a very pleasant 56 years old white female with recurrent non-small cell lung cancer, adenocarcinoma with PDL 1 impression a 60%. The patient was started on treatment with immunotherapy with Ketruda (pembrolizumab) every 3 weeks status post 23 cycles. She has been tolerating this treatment fairly well with no significant adverse effects. She had repeat CT scan of the chest, abdomen and pelvis performed recently. I personally and  independently reviewed the scan images and discuss the results with the patient and her daughter. Her scan showed no evidence for disease progression.  I recommended for the patient to continue her current treatment with immunotherapy as a scheduled and she will proceed with cycle #24 today.. For the coronary artery disease, I would recommend for the patient to see her primary care physician and cardiologist for close follow-up of her condition. For the hypothyroidism, she will continue on levothyroxine. The patient will come back for follow-up visit in 3 weeks for evaluation before starting cycle #25. She was advised to call immediately if she has any concerning symptoms in the interval. The patient voices understanding of current disease status and treatment options and is in agreement with the current care plan. All questions were answered. The patient knows to call the clinic with any problems, questions or concerns. We can certainly see the patient much sooner if necessary.  Disclaimer: This note was dictated with voice recognition software. Similar sounding words can inadvertently be transcribed and may not be corrected upon review.

## 2016-09-06 NOTE — Telephone Encounter (Signed)
Gave patient AVS and calender per 09/06/2016 los. - [facility] to contact patient with MRI

## 2016-09-06 NOTE — Patient Instructions (Signed)
Steps to Quit Smoking Smoking tobacco can be bad for your health. It can also affect almost every organ in your body. Smoking puts you and people around you at risk for many serious long-lasting (chronic) diseases. Quitting smoking is hard, but it is one of the best things that you can do for your health. It is never too late to quit. What are the benefits of quitting smoking? When you quit smoking, you lower your risk for getting serious diseases and conditions. They can include:  Lung cancer or lung disease.  Heart disease.  Stroke.  Heart attack.  Not being able to have children (infertility).  Weak bones (osteoporosis) and broken bones (fractures). If you have coughing, wheezing, and shortness of breath, those symptoms may get better when you quit. You may also get sick less often. If you are pregnant, quitting smoking can help to lower your chances of having a baby of low birth weight. What can I do to help me quit smoking? Talk with your doctor about what can help you quit smoking. Some things you can do (strategies) include:  Quitting smoking totally, instead of slowly cutting back how much you smoke over a period of time.  Going to in-person counseling. You are more likely to quit if you go to many counseling sessions.  Using resources and support systems, such as:  Online chats with a counselor.  Phone quitlines.  Printed self-help materials.  Support groups or group counseling.  Text messaging programs.  Mobile phone apps or applications.  Taking medicines. Some of these medicines may have nicotine in them. If you are pregnant or breastfeeding, do not take any medicines to quit smoking unless your doctor says it is okay. Talk with your doctor about counseling or other things that can help you. Talk with your doctor about using more than one strategy at the same time, such as taking medicines while you are also going to in-person counseling. This can help make quitting  easier. What things can I do to make it easier to quit? Quitting smoking might feel very hard at first, but there is a lot that you can do to make it easier. Take these steps:  Talk to your family and friends. Ask them to support and encourage you.  Call phone quitlines, reach out to support groups, or work with a counselor.  Ask people who smoke to not smoke around you.  Avoid places that make you want (trigger) to smoke, such as:  Bars.  Parties.  Smoke-break areas at work.  Spend time with people who do not smoke.  Lower the stress in your life. Stress can make you want to smoke. Try these things to help your stress:  Getting regular exercise.  Deep-breathing exercises.  Yoga.  Meditating.  Doing a body scan. To do this, close your eyes, focus on one area of your body at a time from head to toe, and notice which parts of your body are tense. Try to relax the muscles in those areas.  Download or buy apps on your mobile phone or tablet that can help you stick to your quit plan. There are many free apps, such as QuitGuide from the CDC (Centers for Disease Control and Prevention). You can find more support from smokefree.gov and other websites. This information is not intended to replace advice given to you by your health care provider. Make sure you discuss any questions you have with your health care provider. Document Released: 03/12/2009 Document Revised: 01/12/2016 Document   Reviewed: 09/30/2014 Elsevier Interactive Patient Education  2017 [employer] Inc.  

## 2016-09-06 NOTE — Patient Instructions (Signed)
[facility] Discharge Instructions for Patients Receiving Chemotherapy  Today you received the following chemotherapy agents:  Keytruda.  To help prevent nausea and vomiting after your treatment, we encourage you to take your nausea medication as prescribed.   If you develop nausea and vomiting that is not controlled by your nausea medication, call the clinic.   BELOW ARE SYMPTOMS THAT SHOULD BE REPORTED IMMEDIATELY:  *FEVER GREATER THAN 100.5 F  *CHILLS WITH OR WITHOUT FEVER  NAUSEA AND VOMITING THAT IS NOT CONTROLLED WITH YOUR NAUSEA MEDICATION  *UNUSUAL SHORTNESS OF BREATH  *UNUSUAL BRUISING OR BLEEDING  TENDERNESS IN MOUTH AND THROAT WITH OR WITHOUT PRESENCE OF ULCERS  *URINARY PROBLEMS  *BOWEL PROBLEMS  UNUSUAL RASH Items with * indicate a potential emergency and should be followed up as soon as possible.  Feel free to call the clinic you have any questions or concerns. The clinic phone number is [phone].  Please show the CHEMO ALERT CARD at check-in to the Emergency Department and triage nurse.   

## 2016-09-09 ENCOUNTER — Ambulatory Visit (HOSPITAL_COMMUNITY)
Admission: RE | Admit: 2016-09-09 | Discharge: 2016-09-09 | Disposition: A | Discharge status: Home / Self Care | Payer: BLUE CROSS/BLUE SHIELD | Source: Ambulatory Visit | Attending: Internal Medicine | Admitting: Internal Medicine

## 2016-09-09 DIAGNOSIS — G4452 New daily persistent headache (NDPH): Secondary | ICD-10-CM | POA: Diagnosis present

## 2016-09-09 DIAGNOSIS — Z5112 Encounter for antineoplastic immunotherapy: Secondary | ICD-10-CM | POA: Insufficient documentation

## 2016-09-09 DIAGNOSIS — C3412 Malignant neoplasm of upper lobe, left bronchus or lung: Secondary | ICD-10-CM

## 2016-09-09 DIAGNOSIS — E039 Hypothyroidism, unspecified: Secondary | ICD-10-CM | POA: Insufficient documentation

## 2016-09-09 MED ORDER — GADOBENATE DIMEGLUMINE 529 MG/ML IV SOLN
15.0000 mL | Freq: Once | INTRAVENOUS | Status: AC | PRN
  Administered 2016-09-09: 15 mL via INTRAVENOUS

## 2016-09-13 ENCOUNTER — Other Ambulatory Visit: Payer: Self-pay | Admitting: *Deleted

## 2016-09-13 DIAGNOSIS — E039 Hypothyroidism, unspecified: Secondary | ICD-10-CM

## 2016-09-13 DIAGNOSIS — C3432 Malignant neoplasm of lower lobe, left bronchus or lung: Secondary | ICD-10-CM

## 2016-09-13 MED ORDER — LEVOTHYROXINE SODIUM 125 MCG PO TABS: 125.0000 ug | ORAL_TABLET | Freq: Every day | ORAL | 0 refills | Status: DC

## 2016-09-15 DIAGNOSIS — Z736 Limitation of activities due to disability: Secondary | ICD-10-CM

## 2016-09-21 ENCOUNTER — Encounter: Payer: Self-pay | Admitting: Physician Assistant

## 2016-09-21 ENCOUNTER — Ambulatory Visit (INDEPENDENT_AMBULATORY_CARE_PROVIDER_SITE_OTHER): Payer: Medicaid Other | Admitting: Physician Assistant

## 2016-09-21 VITALS — BP 124/60 | HR 77 | Ht 64.0 in | Wt 139.8 lb

## 2016-09-21 DIAGNOSIS — I2510 Atherosclerotic heart disease of native coronary artery without angina pectoris: Secondary | ICD-10-CM | POA: Diagnosis not present

## 2016-09-21 DIAGNOSIS — I779 Disorder of arteries and arterioles, unspecified: Secondary | ICD-10-CM | POA: Diagnosis not present

## 2016-09-21 DIAGNOSIS — I351 Nonrheumatic aortic (valve) insufficiency: Secondary | ICD-10-CM | POA: Diagnosis not present

## 2016-09-21 DIAGNOSIS — C3432 Malignant neoplasm of lower lobe, left bronchus or lung: Secondary | ICD-10-CM

## 2016-09-21 DIAGNOSIS — E785 Hyperlipidemia, unspecified: Secondary | ICD-10-CM | POA: Diagnosis not present

## 2016-09-21 DIAGNOSIS — F17200 Nicotine dependence, unspecified, uncomplicated: Secondary | ICD-10-CM

## 2016-09-21 DIAGNOSIS — I739 Peripheral vascular disease, unspecified: Secondary | ICD-10-CM

## 2016-09-21 NOTE — Patient Instructions (Addendum)
Medication Instructions:  Your physician recommends that you continue on your current medications as directed. Please refer to the Current Medication list given to you today.  Labwork: FASTING LIPID PANEL TO BE DONE IN THE NEXT 1-2 WEEKS  Testing/Procedures: 1. Your physician has requested that you have an echocardiogram. Echocardiography is a painless test that uses sound waves to create images of your heart. It provides your doctor with information about the size and shape of your heart and how well your heart's chambers and valves are working. This procedure takes approximately one hour. There are no restrictions for this procedure. THIS IS TO BE DONE AT [name]  2. Your physician has requested that you have a carotid duplex. This test is an ultrasound of the carotid arteries in your neck. It looks at blood flow through these arteries that supply the brain with blood. Allow one hour for this exam. There are no restrictions or special instructions. THIS IS TO BE DONE AT [facility]  Follow-Up: Your physician wants you to follow-up in: 1 YEAR WITH DR. Acie Fredrickson You will receive a reminder letter in the mail two months in advance. If you don't receive a letter, please call our office to schedule the follow-up appointment.  Any Other Special Instructions Will Be Listed Below (If Applicable).  If you need a refill on your cardiac medications before your next appointment, please call your pharmacy.

## 2016-09-21 NOTE — Progress Notes (Signed)
Cardiology Office Note:    Date:  09/21/2016   ID:  Sharon [name], DOB Sep 25, 1960, MRN [id number]  PCP:  Sharon Riches, NP  Cardiologist:  Dr. Liam Rogers   Electrophysiologist:  n/a Oncologist: Dr. Curt Bears  Referring MD: Sharon Riches, NP   Chief Complaint  Patient presents with  . Coronary Artery Disease    follow up    History of Present Illness:    Sharon [name] is a 56 y.o. female with a hx of CAD status post anterior STEMI in 09/2007 treated with DES 2 to the LAD, carotid artery disease, HL, tobacco abuse, non-small cell lung CA status post lobectomy. Last seen by Dr. Acie [name] 2/16. She has had a recurrence of her lung CA and has been treated with Sharon [name]   She returns for Cardiology follow up.  She tells me that her Oncologist was concerned about her lab work and wanted her to follow up here.  Recent labs reviewed and her WBC is mildly elevated at 10.8.  All other parameters were WNL.  Since she was last here in 2016, she denies chest pain, shortness of breath, syncope, orthopnea, PND or significant pedal edema.   Prior CV studies:   The following studies were reviewed today:  Carotid US 8/15 R 40-59; L1-39 >> follow-up 1 year  Echo 8/09 EF 55-60, impaired relaxation, mild aortic stenosis (mean 8.1), moderate aortic insufficiency, trace MR, trace TR  LHC 5/09 LM normal LAD proximal 99; branching DX with one branch occluded and the other severely diseased LCx okay with irregularities in the OM1 RCA proximal occluded-CTO EF 30-40, anterior wall HK PCI: 3 x 18 mm Promus DES, 2.5 x 15 mm Promus DES to the LAD  Past Medical History:  Diagnosis Date  . Aortic insufficiency    Echo 8/09:  EF 55-60, impaired relaxation, mild aortic stenosis (mean 8.1), moderate aortic insufficiency, trace MR, trace TR  . Arthritis   . Carotid artery disease ([place]) 03/09/2011   Carotid US 8/15 - R 40-59; L1-39 >> follow-up 1 year  . COPD (chronic obstructive pulmonary disease)  ([place])   . Coronary artery disease involving native coronary artery of native heart without angina pectoris 11/24/2009   s/p ant STEMI 5/09 >> LHC 5/09 - LM normal; LAD proximal 99; branching DX with one branch occluded and the other severely diseased; LCx okay with irregularities in the OM1; RCA proximal occluded-CTO; EF 30-40, anterior wall HK  >>  PCI: 3 x 18 mm Promus DES, 2.5 x 15 mm Promus DES to the LAD  . Emphysema of lung ([place])   . Headache 09/06/2016  . History of acute anterior wall MI 12/2007   s/p DES x 2 to LAD  . Hyperlipidemia   . Ischemic cardiomyopathy   . Non-small cell lung cancer ([place])    per CT CHEST/PET 2/4 and 07/18/14 @ [facility]  . Pneumonia    2015   HX BRONCHITIS    . Tobacco user     Past Surgical History:  Procedure Laterality Date  . CARDIAC CATHETERIZATION  10/15/2007   EF 30-40%  . CESAREAN SECTION    . CHOLECYSTECTOMY    . CORONARY ANGIOPLASTY WITH STENT PLACEMENT     LAD  . ectopic pregnancies requiring laparotomies    . LEAD REMOVAL Left 08/14/2014   Procedure: CRYO INTERCOSTAL NERVE BLOCK;  Surgeon: Melrose Nakayama, MD;  Location: [facility];  Service: Thoracic;  Laterality: Left;  . LOBECTOMY Left 08/14/2014  Procedure: LEFT LOWER LOBECTOMY;  Surgeon: Melrose Nakayama, MD;  Location: [facility];  Service: Thoracic;  Laterality: Left;  . NODE DISSECTION Left 08/14/2014   Procedure: NODE DISSECTION, LEFT LOWER LOBE LUNG;  Surgeon: Melrose Nakayama, MD;  Location: [place];  Service: Thoracic;  Laterality: Left;  . SHOULDER SURGERY     LEFT X 2   (REMOVED SOME COLLAR BONE )  . TRANSTHORACIC ECHOCARDIOGRAM  10/17/2007  . TUBAL LIGATION    . US ECHOCARDIOGRAPHY  01/07/2008   EF 55-60%  . VIDEO ASSISTED THORACOSCOPY (VATS)/WEDGE RESECTION Left 08/14/2014   Procedure: VIDEO ASSISTED THORACOSCOPY (VATS)/WEDGE RESECTION;  Surgeon: Melrose Nakayama, MD;  Location: [facility];  Service: Thoracic;  Laterality: Left;    Current Medications: Current Meds    Medication Sig  . ALPRAZolam (XANAX) 0.25 MG tablet Take 0.25 mg by mouth as needed for anxiety or sleep.   Marland Kitchen aspirin (ASPIR-LOW) 81 MG EC tablet Take 81 mg by mouth daily. On hold  . clopidogrel (PLAVIX) 75 MG tablet Take 1 tablet (75 mg total) by mouth daily.  Marland Kitchen levothyroxine (SYNTHROID, LEVOTHROID) 125 MCG tablet Take 1 tablet (125 mcg total) by mouth daily before breakfast.  . Magnesium 250 MG TABS Take 250 mg by mouth daily.  . ondansetron (ZOFRAN) 8 MG tablet TAKE 1 TABLET (8 MG TOTAL) BY MOUTH EVERY 8 (EIGHT) HOURS AS NEEDED FOR NAUSEA OR VOMITING.  . Pembrolizumab (KEYTRUDA IV) TAKE KEYTRUDA  IV INFUSION ONCE EVERY 3 WEEKS FOR CHEMO( PT. NOT SURE OF DOSAGE)  . potassium chloride (MICRO-K) 10 MEQ CR capsule Take 10 mEq by mouth 2 (two) times daily.  . rosuvastatin (CRESTOR) 20 MG tablet Take 20 mg by mouth daily.  . traMADol (ULTRAM) 50 MG tablet Take 50 mg by mouth every 6 (six) hours as needed for moderate pain or severe pain.     Allergies:   Codeine; Lipitor [atorvastatin calcium]; Morphine and related; and Oxycodone-acetaminophen   Social History   Social History  . Marital status: Divorced    Spouse name: N/A  . Number of children: N/A  . Years of education: N/A   Social History Main Topics  . Smoking status: Current Every Day Smoker    Packs/day: 1.00    Years: 36.00    Types: Cigarettes    Last attempt to quit: 08/07/2014  . Smokeless tobacco: Never Used     Comment: smokes 1/2 pk day  . Alcohol use Yes     Comment: occasional    (RARELY)  . Drug use: No  . Sexual activity: Not Currently   Other Topics Concern  . 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 Diabetes Mother   . Hypertension Mother   . Cancer Father     pancreatic and esophageal  . Obesity Sister   . Cancer Sister     breast     ROS:   Please see the history of present illness.    ROS All other  systems reviewed and are negative.   EKGs/Labs/Other Test Reviewed:    EKG:  EKG is  ordered today.  The ekg ordered today demonstrates NSR, HR 77, normal axis, septal Q waves, QTc 434 ms, no change since prior tracing 01/20/14  Recent Labs: 08/16/2016: TSH 1.758 09/06/2016: ALT 13; BUN 9.2; Creatinine 0.7; HGB 15.0; Platelets 235; Potassium 3.8; Sodium 140  Recent Lipid Panel    Component Value Date/Time   CHOL 243 (H) 04/28/2014 0741   TRIG 61.0 04/28/2014 0741   HDL 41.20 04/28/2014 0741   CHOLHDL 6 04/28/2014 0741   VLDL 12.2 04/28/2014 0741   LDLCALC 190 (H) 04/28/2014 0741   LDLDIRECT 140.7 06/06/2011 0918     Physical Exam:    VS:  BP 124/60   Pulse 77   Ht '5\' 4"'$  (1.626 m)   Wt 139 lb 12.8 oz (63.4 kg)   BMI 24.00 kg/m     Wt Readings from Last 3 Encounters:  09/21/16 139 lb 12.8 oz (63.4 kg)  09/06/16 140 lb 11.2 oz (63.8 kg)  08/16/16 139 lb 3.2 oz (63.1 kg)     Physical Exam  Constitutional: She is oriented to person, place, and time. She appears well-developed and well-nourished. No distress.  HENT:  Head: Normocephalic and atraumatic.  Eyes: No scleral icterus.  Neck: Normal range of motion. No JVD present.  Cardiovascular: Normal rate, regular rhythm, S1 normal and S2 normal.   Murmur heard.  Medium-pitched early systolic murmur is present with a grade of 2/6   Decrescendo diastolic murmur is present with a grade of 2/6  at the lower left sternal border Pulses:      Dorsalis pedis pulses are 2+ on the right side, and 2+ on the left side.       Posterior tibial pulses are 2+ on the right side, and 2+ on the left side.  Pulmonary/Chest: Breath sounds normal. She has no wheezes. She has no rhonchi. She has no rales.  Abdominal: Soft. There is no tenderness.  Musculoskeletal: She exhibits no edema.  Neurological: She is alert and oriented to person, place, and time.  Skin: Skin is warm and dry.  Psychiatric: She has a normal mood and affect.     ASSESSMENT:    1. Coronary artery disease involving native coronary artery of native heart without angina pectoris   2. Bilateral carotid artery disease ([place])   3. Aortic valve insufficiency, etiology of cardiac valve disease unspecified   4. Hyperlipidemia, unspecified hyperlipidemia type   5. Malignant neoplasm of lower lobe of left lung ([place])   6. TOBACCO ABUSE    PLAN:    In order of problems listed above:  1. Coronary artery disease involving native coronary artery of native heart without angina pectoris -  Status post anterior MI in 2009 treated with DES 2 to the LAD. She has residual chronic occlusion of the RCA. EF was previously 30-40% but improved to 55-60% by last echocardiogram in 2009. She denies any anginal symptoms continue aspirin, Plavix, statin.  2. Bilateral carotid artery disease ([place]) -  Last [place] 8/15 with 40-59% ICA stenosis and 2-95% LICA stenosis.   -  Plan: VAS US CAROTID  3. Aortic valve insufficiency, etiology of cardiac valve disease unspecified -  Moderate aortic insufficiency in 2009 by echocardiogram. No follow-up echocardiogram since that time.   -  Plan: ECHOCARDIOGRAM COMPLETE  4. Hyperlipidemia, unspecified hyperlipidemia type -  Continue statin. Recent LFTs normal. No follow-up lipid panel in several years.   -  Plan: Lipid Profile  5. Malignant neoplasm of lower lobe of left lung ([place]) - Continue follow-up with oncology.  6.TOBACCO ABUSE - I have recommended cessation. She is not ready to quit.   Dispo:  Return in about 1 year (around 09/21/2017) for Routine Follow Up, w/ Dr. Acie [name].   Medication Adjustments/Labs and Tests Ordered: Current medicines are  reviewed at length with the patient today.  Concerns regarding medicines are outlined above.  Orders placed this visit:  Orders Placed This Encounter  Procedures  . Lipid Profile  . EKG 12-Lead  . ECHOCARDIOGRAM COMPLETE   Medication changes this visit: No orders of the defined  types were placed in this encounter.   Signed, Richardson Dopp, PA-C  09/21/2016 12:58 PM    [facility] [address], [place], [place]  [address] Phone: [phone]; Fax: [phone]

## 2016-09-23 ENCOUNTER — Ambulatory Visit (HOSPITAL_BASED_OUTPATIENT_CLINIC_OR_DEPARTMENT_OTHER)
Admission: RE | Admit: 2016-09-23 | Discharge: 2016-09-23 | Disposition: A | Discharge status: Home / Self Care | Payer: Medicaid Other | Source: Ambulatory Visit | Attending: Physician Assistant | Admitting: Physician Assistant

## 2016-09-23 ENCOUNTER — Encounter: Payer: Self-pay | Admitting: Physician Assistant

## 2016-09-23 ENCOUNTER — Telehealth: Payer: Self-pay | Admitting: *Deleted

## 2016-09-23 ENCOUNTER — Ambulatory Visit (HOSPITAL_COMMUNITY)
Admission: RE | Admit: 2016-09-23 | Discharge: 2016-09-23 | Disposition: A | Discharge status: Home / Self Care | Payer: Medicaid Other | Source: Ambulatory Visit | Attending: Physician Assistant | Admitting: Physician Assistant

## 2016-09-23 DIAGNOSIS — I739 Peripheral vascular disease, unspecified: Principal | ICD-10-CM

## 2016-09-23 DIAGNOSIS — I351 Nonrheumatic aortic (valve) insufficiency: Secondary | ICD-10-CM

## 2016-09-23 DIAGNOSIS — I6523 Occlusion and stenosis of bilateral carotid arteries: Secondary | ICD-10-CM | POA: Insufficient documentation

## 2016-09-23 DIAGNOSIS — I779 Disorder of arteries and arterioles, unspecified: Secondary | ICD-10-CM | POA: Insufficient documentation

## 2016-09-23 LAB — VAS US CAROTID
LCCAPDIAS: 21 cm/s
LEFT ECA DIAS: -11 cm/s
LEFT VERTEBRAL DIAS: 15 cm/s
LICADDIAS: -29 cm/s
LICAPDIAS: -19 cm/s
LICAPSYS: -64 cm/s
Left CCA dist dias: -21 cm/s
Left CCA dist sys: -94 cm/s
Left CCA prox sys: 114 cm/s
Left ICA dist sys: -122 cm/s
RIGHT ECA DIAS: -9 cm/s
RIGHT VERTEBRAL DIAS: 11 cm/s
Right CCA prox dias: 15 cm/s
Right CCA prox sys: 93 cm/s
Right cca dist sys: -99 cm/s

## 2016-09-23 NOTE — Telephone Encounter (Signed)
Ptcb and has been notified of both Carotid and Echo results and findings by phone with verbal understanding. Pt thanked me for the call.

## 2016-09-23 NOTE — Telephone Encounter (Signed)
Tried to reach to go over echo results though no answer. I will try again later.

## 2016-09-23 NOTE — Progress Notes (Signed)
*  PRELIMINARY RESULTS* Vascular Ultrasound Carotid Duplex (Doppler) has been completed.   Findings suggest 1-39% internal carotid artery stenosis bilaterally. Vertebral arteries are patent with antegrade flow.   09/23/2016 10:19 AM Maudry Mayhew, BS, RVT, RDCS, RDMS

## 2016-09-23 NOTE — Telephone Encounter (Signed)
-----   Message from Liliane Shi, [place] sent at 09/23/2016  4:26 PM EDT ----- Please call the patient. Carotid US with stable bilateral plaque (1-39% stenosis). Repeat in 1 year.  Please fax a copy of this study result to her PCP:  [name], NP  Thanks! Richardson Dopp, PA-C    09/23/2016 4:24 PM

## 2016-09-23 NOTE — Telephone Encounter (Signed)
-----   Message from Liliane Shi, [place] sent at 09/23/2016  1:40 PM EDT ----- Please call the patient. The echocardiogram shows normal ejection fraction and stable leakage of the aortic valve without significant change from last study.  Continue current treatment plan.  Please fax a copy of this study result to her PCP:  [name], NP  Thanks! Richardson Dopp, PA-C    09/23/2016 1:36 PM

## 2016-09-23 NOTE — Progress Notes (Signed)
  Echocardiogram 2D Echocardiogram has been performed.  Sharon [name] 09/23/2016, 9:48 AM

## 2016-09-27 ENCOUNTER — Encounter: Payer: Self-pay | Admitting: Internal Medicine

## 2016-09-27 ENCOUNTER — Ambulatory Visit (HOSPITAL_BASED_OUTPATIENT_CLINIC_OR_DEPARTMENT_OTHER): Payer: BLUE CROSS/BLUE SHIELD | Admitting: Internal Medicine

## 2016-09-27 ENCOUNTER — Ambulatory Visit (HOSPITAL_BASED_OUTPATIENT_CLINIC_OR_DEPARTMENT_OTHER): Payer: BLUE CROSS/BLUE SHIELD

## 2016-09-27 ENCOUNTER — Other Ambulatory Visit (HOSPITAL_BASED_OUTPATIENT_CLINIC_OR_DEPARTMENT_OTHER): Payer: BLUE CROSS/BLUE SHIELD

## 2016-09-27 VITALS — BP 131/82 | HR 66 | Temp 98.6°F | Ht 64.0 in | Wt 142.6 lb

## 2016-09-27 DIAGNOSIS — C3412 Malignant neoplasm of upper lobe, left bronchus or lung: Secondary | ICD-10-CM

## 2016-09-27 DIAGNOSIS — R5382 Chronic fatigue, unspecified: Secondary | ICD-10-CM

## 2016-09-27 DIAGNOSIS — C3432 Malignant neoplasm of lower lobe, left bronchus or lung: Secondary | ICD-10-CM | POA: Diagnosis not present

## 2016-09-27 DIAGNOSIS — E039 Hypothyroidism, unspecified: Secondary | ICD-10-CM

## 2016-09-27 DIAGNOSIS — Z5112 Encounter for antineoplastic immunotherapy: Secondary | ICD-10-CM | POA: Diagnosis not present

## 2016-09-27 LAB — CBC WITH DIFFERENTIAL/PLATELET
BASO%: 0.8 % (ref 0.0–2.0)
BASOS ABS: 0.1 10*3/uL (ref 0.0–0.1)
EOS%: 1.5 % (ref 0.0–7.0)
Eosinophils Absolute: 0.1 10*3/uL (ref 0.0–0.5)
HEMATOCRIT: 45.4 % (ref 34.8–46.6)
HGB: 15.4 g/dL (ref 11.6–15.9)
LYMPH%: 26.5 % (ref 14.0–49.7)
MCH: 33.1 pg (ref 25.1–34.0)
MCHC: 33.9 g/dL (ref 31.5–36.0)
MCV: 97.5 fL (ref 79.5–101.0)
MONO#: 0.4 10*3/uL (ref 0.1–0.9)
MONO%: 6.1 % (ref 0.0–14.0)
NEUT#: 4.4 10*3/uL (ref 1.5–6.5)
NEUT%: 65.1 % (ref 38.4–76.8)
Platelets: 219 10*3/uL (ref 145–400)
RBC: 4.65 10*6/uL (ref 3.70–5.45)
RDW: 13.9 % (ref 11.2–14.5)
WBC: 6.8 10*3/uL (ref 3.9–10.3)
lymph#: 1.8 10*3/uL (ref 0.9–3.3)

## 2016-09-27 LAB — COMPREHENSIVE METABOLIC PANEL
ALT: 14 U/L (ref 0–55)
AST: 20 U/L (ref 5–34)
Albumin: 3.5 g/dL (ref 3.5–5.0)
Alkaline Phosphatase: 86 U/L (ref 40–150)
Anion Gap: 9 mEq/L (ref 3–11)
BUN: 10.9 mg/dL (ref 7.0–26.0)
CALCIUM: 9.4 mg/dL (ref 8.4–10.4)
CHLORIDE: 108 meq/L (ref 98–109)
CO2: 22 meq/L (ref 22–29)
Creatinine: 0.7 mg/dL (ref 0.6–1.1)
EGFR: >90 mL/min/{1.73_m2} (ref >90)
Glucose: 91 mg/dl (ref 70–140)
POTASSIUM: 4 meq/L (ref 3.5–5.1)
Sodium: 140 mEq/L (ref 136–145)
Total Bilirubin: 0.36 mg/dL (ref 0.20–1.20)
Total Protein: 7.4 g/dL (ref 6.4–8.3)

## 2016-09-27 MED ORDER — SODIUM CHLORIDE 0.9 % IV SOLN
200.0000 mg | Freq: Once | INTRAVENOUS | Status: AC
  Administered 2016-09-27: 200 mg via INTRAVENOUS
  Filled 2016-09-27: qty 8

## 2016-09-27 MED ORDER — SODIUM CHLORIDE 0.9 % IV SOLN
Freq: Once | INTRAVENOUS | Status: AC
  Administered 2016-09-27: 12:00:00 via INTRAVENOUS

## 2016-09-27 NOTE — Progress Notes (Signed)
[place] Telephone:([phone]) [phone]   Fax:(336) [phone]  OFFICE PROGRESS NOTE  [name], NP [address] [place] [address]  DIAGNOSIS: Recurrent non-small cell lung cancer, adenocarcinoma with PDL 1 expression of 60% diagnosed in November 2016. The patient was initially diagnosed as stage IB (T2a, N0, M0) adenocarcinoma in March 2016.  PRIOR THERAPY:  Status post left video-assisted thoracoscopy, wedge resection left lower lobe, thoracoscopic left lower lobectomy and mediastinal lymph node dissection on 08/14/2014.   CURRENT THERAPY: Immunotherapy with Ketruda (pembrolizumab) 200 mg IV every 3 weeks status post 23 cycles. First dose was given 05/12/2015.   INTERVAL HISTORY: Sharon [name] 56 y.o. female returns to the clinic today for follow-up visit accompanied by her daughter. The patient has no complaints today. She elected but the breast after she lost her job but she does not want to start any antidepressant medication. She denied having any chest pain, shortness of breath, cough or hemoptysis. She has no fever or chills. She denied having any nausea, vomiting, diarrhea or constipation. She is tolerating her treatment with [facility] [employer] (pembrolizumab) fairly well.  MEDICAL HISTORY: Past Medical History:  Diagnosis Date  . Aortic insufficiency    Echo 8/09:  EF 55-60, impaired relaxation, mild aortic stenosis (mean 8.1), moderate aortic insufficiency, trace MR, trace TR // Echo 4/18: mild LVH, EF 55-60, no RWMA, Gr 1 DD, mod AI   . Arthritis   . Carotid artery disease ([place]) 03/09/2011   Carotid US 8/15 - R 40-59; L1-39 >> follow-up 1 year // Carotid US 4/18: ICA 1-39 bilaterally - repeat 1 yr  . COPD (chronic obstructive pulmonary disease) ([place])   . Coronary artery disease involving native coronary artery of native heart without angina pectoris 11/24/2009   s/p ant STEMI 5/09 >> LHC 5/09 - LM normal; LAD proximal 99; branching DX with one branch occluded and  the other severely diseased; LCx okay with irregularities in the OM1; RCA proximal occluded-CTO; EF 30-40, anterior wall HK  >>  PCI: 3 x 18 mm Promus DES, 2.5 x 15 mm Promus DES to the LAD  . Emphysema of lung ([place])   . Headache 09/06/2016  . History of acute anterior wall MI 12/2007   s/p DES x 2 to LAD  . Hyperlipidemia   . Ischemic cardiomyopathy   . Non-small cell lung cancer ([place])    per CT CHEST/PET 2/4 and 07/18/14 @ [facility]  . Pneumonia    2015   HX BRONCHITIS    . Tobacco user     ALLERGIES:  is allergic to codeine; lipitor [atorvastatin calcium]; morphine and related; and oxycodone-acetaminophen.  MEDICATIONS:  Current Outpatient Prescriptions  Medication Sig Dispense Refill  . aspirin (ASPIR-LOW) 81 MG EC tablet Take 81 mg by mouth daily. On hold    . clopidogrel (PLAVIX) 75 MG tablet Take 1 tablet (75 mg total) by mouth daily. 90 tablet 3  . levothyroxine (SYNTHROID, LEVOTHROID) 125 MCG tablet Take 1 tablet (125 mcg total) by mouth daily before breakfast. 30 tablet 0  . Magnesium 250 MG TABS Take 250 mg by mouth daily.    . ondansetron (ZOFRAN) 8 MG tablet TAKE 1 TABLET (8 MG TOTAL) BY MOUTH EVERY 8 (EIGHT) HOURS AS NEEDED FOR NAUSEA OR VOMITING. 30 tablet 0  . Pembrolizumab (KEYTRUDA IV) TAKE KEYTRUDA  IV INFUSION ONCE EVERY 3 WEEKS FOR CHEMO( PT. NOT SURE OF DOSAGE)    . potassium chloride (MICRO-K) 10 MEQ CR capsule  Take 10 mEq by mouth 2 (two) times daily.  0  . rosuvastatin (CRESTOR) 20 MG tablet Take 20 mg by mouth daily.  0  . ALPRAZolam (XANAX) 0.25 MG tablet Take 0.25 mg by mouth as needed for anxiety or sleep.   0  . traMADol (ULTRAM) 50 MG tablet Take 50 mg by mouth every 6 (six) hours as needed for moderate pain or severe pain.     No current facility-administered medications for this visit.     SURGICAL HISTORY:  Past Surgical History:  Procedure Laterality Date  . CARDIAC CATHETERIZATION  10/15/2007   EF 30-40%  . CESAREAN SECTION    . CHOLECYSTECTOMY     . CORONARY ANGIOPLASTY WITH STENT PLACEMENT     LAD  . ectopic pregnancies requiring laparotomies    . LEAD REMOVAL Left 08/14/2014   Procedure: CRYO INTERCOSTAL NERVE BLOCK;  Surgeon: Melrose Nakayama, MD;  Location: [facility];  Service: Thoracic;  Laterality: Left;  . LOBECTOMY Left 08/14/2014   Procedure: LEFT LOWER LOBECTOMY;  Surgeon: Melrose Nakayama, MD;  Location: [facility];  Service: Thoracic;  Laterality: Left;  . NODE DISSECTION Left 08/14/2014   Procedure: NODE DISSECTION, LEFT LOWER LOBE LUNG;  Surgeon: Melrose Nakayama, MD;  Location: [facility];  Service: Thoracic;  Laterality: Left;  . SHOULDER SURGERY     LEFT X 2   (REMOVED SOME COLLAR BONE )  . TRANSTHORACIC ECHOCARDIOGRAM  10/17/2007  . TUBAL LIGATION    . US ECHOCARDIOGRAPHY  01/07/2008   EF 55-60%  . VIDEO ASSISTED THORACOSCOPY (VATS)/WEDGE RESECTION Left 08/14/2014   Procedure: VIDEO ASSISTED THORACOSCOPY (VATS)/WEDGE RESECTION;  Surgeon: Melrose Nakayama, MD;  Location: [facility];  Service: Thoracic;  Laterality: Left;    REVIEW OF SYSTEMS:  A comprehensive review of systems was negative except for: Constitutional: positive for fatigue   PHYSICAL EXAMINATION: General appearance: alert, cooperative, fatigued and no distress Head: Normocephalic, without obvious abnormality, atraumatic Neck: no adenopathy, no JVD, supple, symmetrical, trachea midline and thyroid not enlarged, symmetric, no tenderness/mass/nodules Lymph nodes: Cervical, supraclavicular, and axillary nodes normal. Resp: clear to auscultation bilaterally Back: negative Cardio: regular rate and rhythm, S1, S2 normal, no murmur, click, rub or gallop GI: soft, non-tender; bowel sounds normal; no masses,  no organomegaly Extremities: extremities normal, atraumatic, no cyanosis or edema  ECOG PERFORMANCE STATUS: 1 - Symptomatic but completely ambulatory  Blood pressure 131/82, pulse 66, temperature 98.6 F (37 C), temperature source Oral, height '5\' 4"'$  (1.626  m), weight 142 lb 9.6 oz (64.7 kg), SpO2 97 %.  LABORATORY DATA: Lab Results  Component Value Date   WBC 6.8 09/27/2016   HGB 15.4 09/27/2016   HCT 45.4 09/27/2016   MCV 97.5 09/27/2016   PLT 219 09/27/2016      Chemistry      Component Value Date/Time   NA 140 09/27/2016 0956   K 4.0 09/27/2016 0956   CL 109 08/16/2014 0520   CO2 22 09/27/2016 0956   BUN 10.9 09/27/2016 0956   CREATININE 0.7 09/27/2016 0956      Component Value Date/Time   CALCIUM 9.4 09/27/2016 0956   ALKPHOS 86 09/27/2016 0956   AST 20 09/27/2016 0956   ALT 14 09/27/2016 0956   BILITOT 0.36 09/27/2016 0956       RADIOGRAPHIC STUDIES: Ct Chest W Contrast  Result Date: 09/05/2016 CLINICAL DATA:  56 year old female with history of left-sided lung cancer diagnosed in February 2016 status post resection, currently undergoing ongoing immunotherapy  with Keytruda. EXAM: CT CHEST, ABDOMEN, AND PELVIS WITH CONTRAST TECHNIQUE: Multidetector CT imaging of the chest, abdomen and pelvis was performed following the standard protocol during bolus administration of intravenous contrast. CONTRAST:  176m ISOVUE-300 IOPAMIDOL (ISOVUE-300) INJECTION 61% COMPARISON:  CT of the chest, abdomen and pelvis 06/28/2016. FINDINGS: CT CHEST FINDINGS Cardiovascular: Heart size is borderline enlarged with some mild rounding of the left ventricular apex, likely related to prior distal LAD territory myocardial infarction. There is aortic atherosclerosis, as well as atherosclerosis of the great vessels of the mediastinum and the coronary arteries, including calcified atherosclerotic plaque in the left anterior descending and left circumflex coronary arteries. There is no significant pericardial fluid, thickening or pericardial calcification. Thickening calcification of the aortic valve. Incidental imaging of the valve during diastole appears to demonstrate a central region of malcoaptation, suggestive of probable aortic insufficiency.  Mediastinum/Nodes: Previously noted prevascular lymph node appears essentially stable when compared to prior study measuring 11 mm (previously 10 mm). Previously noted right paratracheal lymph node measures 8 mm in short axis, decreased compared to the prior study. No new mediastinal or hilar lymphadenopathy is noted. Esophagus is unremarkable in appearance. No axillary lymphadenopathy. Lungs/Pleura: Status post left lower lobectomy. Compensatory hyperexpansion of the left upper lobe. Postoperative changes of wedge resection in the superior a medial aspect of the left upper lobe are also noted. 3 mm left upper lobe pulmonary nodule (image 39 of series 4) is unchanged. 3 mm ground-glass attenuation nodule in the left upper lobe (image 30 of series 4) is unchanged. Nodular area of scarring in the inferior aspect of the left upper lobe near the base of the left hemithorax is unchanged. No other new suspicious appearing pulmonary nodules or masses are noted. Chronic scarring in the medial segment of the right middle lobe is unchanged. Mild diffuse bronchial wall thickening with mild centrilobular and paraseptal emphysema. No acute consolidative airspace disease. No pleural effusions. Musculoskeletal: Chronic appearing compression fracture of T9 with approximately 25% loss of anterior vertebral body height is unchanged. There are no aggressive appearing lytic or blastic lesions noted in the visualized portions of the skeleton. CT ABDOMEN PELVIS FINDINGS Hepatobiliary: No suspicious cystic or solid hepatic lesions. No intra or extrahepatic biliary ductal dilatation. Status post cholecystectomy. Pancreas: No pancreatic mass. No pancreatic ductal dilatation. No pancreatic or peripancreatic fluid or inflammatory changes. Spleen: Unremarkable. Adrenals/Urinary Tract: Bilateral kidneys and bilateral adrenal glands are normal in appearance. No hydroureteronephrosis. Urinary bladder is normal in appearance. Stomach/Bowel: The  appearance of the stomach is normal. There is no pathologic dilatation of small bowel or colon. Normal appendix. Vascular/Lymphatic: Extensive aortic atherosclerosis, without evidence of aneurysm or dissection in the abdominal or pelvic vasculature. No lymphadenopathy noted in the abdomen or pelvis. Reproductive: Uterus and ovaries are unremarkable in appearance. Other: No significant volume of ascites.  No pneumoperitoneum. Musculoskeletal: There are no aggressive appearing lytic or blastic lesions noted in the visualized portions of the skeleton. IMPRESSION: 1. Stable examination, without evidence to suggest local recurrence of disease or new metastatic disease in the chest, abdomen or pelvis. Previously noted mediastinal lymphadenopathy is essentially stable to slightly improved compared to the prior examination, as detailed above. 2. Mild diffuse bronchial wall thickening with mild centrilobular and paraseptal emphysema; imaging findings suggestive of underlying COPD. 3. Tiny left upper lobe pulmonary nodules and area of chronic scarring in the inferior aspect of the left lower lobe are stable compared to the prior examinations, favored to be benign. 4. Aortic atherosclerosis, in addition  to 2 vessel coronary artery disease. Please note that although the presence of coronary artery calcium documents the presence of coronary artery disease, the severity of this disease and any potential stenosis cannot be assessed on this non-gated CT examination, however, there is evidence of prior distal LAD territory myocardial infarction. Assessment for potential risk factor modification, dietary therapy or pharmacologic therapy may be warranted, if clinically indicated. 5. There are calcifications of the aortic valve. In addition, incidental diastolic imaging of the aortic valve suggests underlying aortic insufficiency. Echocardiographic correlation for evaluation of potential valvular dysfunction may be warranted if  clinically indicated. 6. Additional incidental findings, as above. Electronically Signed   By: Vinnie Langton M.D.   On: 09/05/2016 09:39   Mr [facility]i Cos XB Contrast  Result Date: 09/09/2016 CLINICAL DATA:  56 y/o  F; severe headache.  History of lung cancer. EXAM: MRI HEAD WITHOUT AND WITH CONTRAST TECHNIQUE: Multiplanar, multiecho pulse sequences of the brain and surrounding structures were obtained without and with intravenous contrast. CONTRAST:  14m MULTIHANCE GADOBENATE DIMEGLUMINE 529 MG/ML IV SOLN COMPARISON:  05/04/2015 MRI of the brain. FINDINGS: Brain: No acute infarction, hemorrhage, hydrocephalus, extra-axial collection or mass lesion. Few stable scattered nonspecific foci of T2 FLAIR hyperintense signal abnormality in subcortical white matter and within the pons probably represent minimal chronic microvascular ischemic changes. After administration of intravenous contrast there is no abnormal enhancement of the brain. Vascular: Normal flow voids. Skull and upper cervical spine: Normal marrow signal. Sinuses/Orbits: Negative. Other: None. IMPRESSION: 1. No acute intracranial abnormality. No abnormal enhancement of the brain. 2. Stable minimal chronic microvascular ischemic changes. Electronically Signed   By: LKristine GarbeM.D.   On: 09/09/2016 21:07   Ct Abdomen Pelvis W Contrast  Result Date: 09/05/2016 CLINICAL DATA:  56year old female with history of left-sided lung cancer diagnosed in February 2016 status post resection, currently undergoing ongoing immunotherapy with Keytruda. EXAM: CT CHEST, ABDOMEN, AND PELVIS WITH CONTRAST TECHNIQUE: Multidetector CT imaging of the chest, abdomen and pelvis was performed following the standard protocol during bolus administration of intravenous contrast. CONTRAST:  1041mISOVUE-300 IOPAMIDOL (ISOVUE-300) INJECTION 61% COMPARISON:  CT of the chest, abdomen and pelvis 06/28/2016. FINDINGS: CT CHEST FINDINGS Cardiovascular: Heart size is  borderline enlarged with some mild rounding of the left ventricular apex, likely related to prior distal LAD territory myocardial infarction. There is aortic atherosclerosis, as well as atherosclerosis of the great vessels of the mediastinum and the coronary arteries, including calcified atherosclerotic plaque in the left anterior descending and left circumflex coronary arteries. There is no significant pericardial fluid, thickening or pericardial calcification. Thickening calcification of the aortic valve. Incidental imaging of the valve during diastole appears to demonstrate a central region of malcoaptation, suggestive of probable aortic insufficiency. Mediastinum/Nodes: Previously noted prevascular lymph node appears essentially stable when compared to prior study measuring 11 mm (previously 10 mm). Previously noted right paratracheal lymph node measures 8 mm in short axis, decreased compared to the prior study. No new mediastinal or hilar lymphadenopathy is noted. Esophagus is unremarkable in appearance. No axillary lymphadenopathy. Lungs/Pleura: Status post left lower lobectomy. Compensatory hyperexpansion of the left upper lobe. Postoperative changes of wedge resection in the superior a medial aspect of the left upper lobe are also noted. 3 mm left upper lobe pulmonary nodule (image 39 of series 4) is unchanged. 3 mm ground-glass attenuation nodule in the left upper lobe (image 30 of series 4) is unchanged. Nodular area of scarring in the inferior aspect of the left  upper lobe near the base of the left hemithorax is unchanged. No other new suspicious appearing pulmonary nodules or masses are noted. Chronic scarring in the medial segment of the right middle lobe is unchanged. Mild diffuse bronchial wall thickening with mild centrilobular and paraseptal emphysema. No acute consolidative airspace disease. No pleural effusions. Musculoskeletal: Chronic appearing compression fracture of T9 with approximately 25%  loss of anterior vertebral body height is unchanged. There are no aggressive appearing lytic or blastic lesions noted in the visualized portions of the skeleton. CT ABDOMEN PELVIS FINDINGS Hepatobiliary: No suspicious cystic or solid hepatic lesions. No intra or extrahepatic biliary ductal dilatation. Status post cholecystectomy. Pancreas: No pancreatic mass. No pancreatic ductal dilatation. No pancreatic or peripancreatic fluid or inflammatory changes. Spleen: Unremarkable. Adrenals/Urinary Tract: Bilateral kidneys and bilateral adrenal glands are normal in appearance. No hydroureteronephrosis. Urinary bladder is normal in appearance. Stomach/Bowel: The appearance of the stomach is normal. There is no pathologic dilatation of small bowel or colon. Normal appendix. Vascular/Lymphatic: Extensive aortic atherosclerosis, without evidence of aneurysm or dissection in the abdominal or pelvic vasculature. No lymphadenopathy noted in the abdomen or pelvis. Reproductive: Uterus and ovaries are unremarkable in appearance. Other: No significant volume of ascites.  No pneumoperitoneum. Musculoskeletal: There are no aggressive appearing lytic or blastic lesions noted in the visualized portions of the skeleton. IMPRESSION: 1. Stable examination, without evidence to suggest local recurrence of disease or new metastatic disease in the chest, abdomen or pelvis. Previously noted mediastinal lymphadenopathy is essentially stable to slightly improved compared to the prior examination, as detailed above. 2. Mild diffuse bronchial wall thickening with mild centrilobular and paraseptal emphysema; imaging findings suggestive of underlying COPD. 3. Tiny left upper lobe pulmonary nodules and area of chronic scarring in the inferior aspect of the left lower lobe are stable compared to the prior examinations, favored to be benign. 4. Aortic atherosclerosis, in addition to 2 vessel coronary artery disease. Please note that although the  presence of coronary artery calcium documents the presence of coronary artery disease, the severity of this disease and any potential stenosis cannot be assessed on this non-gated CT examination, however, there is evidence of prior distal LAD territory myocardial infarction. Assessment for potential risk factor modification, dietary therapy or pharmacologic therapy may be warranted, if clinically indicated. 5. There are calcifications of the aortic valve. In addition, incidental diastolic imaging of the aortic valve suggests underlying aortic insufficiency. Echocardiographic correlation for evaluation of potential valvular dysfunction may be warranted if clinically indicated. 6. Additional incidental findings, as above. Electronically Signed   By: Vinnie Langton M.D.   On: 09/05/2016 09:39    ASSESSMENT AND PLAN:  This is a very pleasant 56 years old white female with recurrent non-small cell lung cancer, adenocarcinoma with PDL 1 expression a 60%. She is currently undergoing treatment with immunotherapy with Ketruda (pembrolizumab) every 3 weeks is status post 24 cycles and has been tolerating this treatment well. I recommended for her to proceed with cycle #25 today as a scheduled. For the hypothyroidism, the patient will continue her current treatment with levothyroxine. She will come back for follow-up visit in 3 weeks with the next cycle of her treatment. The patient was advised to call immediately if she has any concerning symptoms in the interval. The patient voices understanding of current disease status and treatment options and is in agreement with the current care plan. All questions were answered. The patient knows to call the clinic with any problems, questions or concerns. We can  certainly see the patient much sooner if necessary. I spent 10 minutes counseling the patient face to face. The total time spent in the appointment was 15 minutes.  Disclaimer: This note was dictated with voice  recognition software. Similar sounding words can inadvertently be transcribed and may not be corrected upon review.

## 2016-09-27 NOTE — Patient Instructions (Signed)
[facility] Discharge Instructions for Patients Receiving Chemotherapy  Today you received the following chemotherapy agents: Keytruda   To help prevent nausea and vomiting after your treatment, we encourage you to take your nausea medication as directed    If you develop nausea and vomiting that is not controlled by your nausea medication, call the clinic.   BELOW ARE SYMPTOMS THAT SHOULD BE REPORTED IMMEDIATELY:  *FEVER GREATER THAN 100.5 F  *CHILLS WITH OR WITHOUT FEVER  NAUSEA AND VOMITING THAT IS NOT CONTROLLED WITH YOUR NAUSEA MEDICATION  *UNUSUAL SHORTNESS OF BREATH  *UNUSUAL BRUISING OR BLEEDING  TENDERNESS IN MOUTH AND THROAT WITH OR WITHOUT PRESENCE OF ULCERS  *URINARY PROBLEMS  *BOWEL PROBLEMS  UNUSUAL RASH Items with * indicate a potential emergency and should be followed up as soon as possible.  Feel free to call the clinic you have any questions or concerns. The clinic phone number is [phone].  Please show the CHEMO ALERT CARD at check-in to the Emergency Department and triage nurse.   

## 2016-09-29 ENCOUNTER — Other Ambulatory Visit: Payer: Medicaid Other | Admitting: *Deleted

## 2016-09-29 DIAGNOSIS — I2510 Atherosclerotic heart disease of native coronary artery without angina pectoris: Secondary | ICD-10-CM

## 2016-09-29 DIAGNOSIS — E785 Hyperlipidemia, unspecified: Secondary | ICD-10-CM

## 2016-09-29 LAB — LIPID PANEL
CHOL/HDL RATIO: 4 ratio (ref 0.0–4.4)
CHOLESTEROL TOTAL: 186 mg/dL (ref 100–199)
HDL: 47 mg/dL (ref >39)
LDL CALC: 122 mg/dL — AB (ref 0–99)
Triglycerides: 86 mg/dL (ref 0–149)
VLDL Cholesterol Cal: 17 mg/dL (ref 5–40)

## 2016-09-30 ENCOUNTER — Telehealth: Payer: Self-pay | Admitting: *Deleted

## 2016-09-30 DIAGNOSIS — E785 Hyperlipidemia, unspecified: Secondary | ICD-10-CM

## 2016-09-30 NOTE — Telephone Encounter (Signed)
Lmtcb x 2 to go over lab results and recommendations.

## 2016-09-30 NOTE — Telephone Encounter (Signed)
-----   Message from Liliane Shi, [place] sent at 09/30/2016  1:44 PM EDT ----- Please call the patient LDL above goal (<70). Increase Crestor to 40 mg QD Check Lipids and LFTs in 3 mos.

## 2016-10-03 ENCOUNTER — Telehealth: Payer: Self-pay | Admitting: Internal Medicine

## 2016-10-03 MED ORDER — ROSUVASTATIN CALCIUM 40 MG PO TABS: 40.0000 mg | ORAL_TABLET | Freq: Every day | ORAL | 3 refills | Status: DC

## 2016-10-03 NOTE — Telephone Encounter (Signed)
New message      Pt is returning your call for test results

## 2016-10-03 NOTE — Telephone Encounter (Signed)
Scheduled appt per 5/1 los - left message to let patient know additional appts added.

## 2016-10-03 NOTE — Telephone Encounter (Signed)
Pt notified of lab results and findings by phone today with verbal understanding. Pt is agreeable to increase Crestor to 40 mg daily with FLP/LFT 01/09/17. Pt request new Rx to be sent in today to [facility]. Pt thanked me for my call.

## 2016-10-18 ENCOUNTER — Ambulatory Visit (HOSPITAL_BASED_OUTPATIENT_CLINIC_OR_DEPARTMENT_OTHER): Payer: BLUE CROSS/BLUE SHIELD

## 2016-10-18 ENCOUNTER — Encounter: Payer: Self-pay | Admitting: Internal Medicine

## 2016-10-18 ENCOUNTER — Other Ambulatory Visit (HOSPITAL_BASED_OUTPATIENT_CLINIC_OR_DEPARTMENT_OTHER): Payer: BLUE CROSS/BLUE SHIELD

## 2016-10-18 ENCOUNTER — Ambulatory Visit (HOSPITAL_BASED_OUTPATIENT_CLINIC_OR_DEPARTMENT_OTHER): Payer: BLUE CROSS/BLUE SHIELD | Admitting: Internal Medicine

## 2016-10-18 VITALS — BP 121/70 | HR 71 | Temp 98.2°F | Resp 17 | Ht 64.0 in | Wt 140.8 lb

## 2016-10-18 DIAGNOSIS — Z5112 Encounter for antineoplastic immunotherapy: Secondary | ICD-10-CM | POA: Diagnosis not present

## 2016-10-18 DIAGNOSIS — C3432 Malignant neoplasm of lower lobe, left bronchus or lung: Secondary | ICD-10-CM

## 2016-10-18 DIAGNOSIS — R5382 Chronic fatigue, unspecified: Secondary | ICD-10-CM

## 2016-10-18 DIAGNOSIS — C3412 Malignant neoplasm of upper lobe, left bronchus or lung: Secondary | ICD-10-CM

## 2016-10-18 DIAGNOSIS — E039 Hypothyroidism, unspecified: Secondary | ICD-10-CM

## 2016-10-18 DIAGNOSIS — Z79899 Other long term (current) drug therapy: Secondary | ICD-10-CM | POA: Diagnosis not present

## 2016-10-18 LAB — CBC WITH DIFFERENTIAL/PLATELET
BASO%: 0.7 % (ref 0.0–2.0)
BASOS ABS: 0.1 10*3/uL (ref 0.0–0.1)
EOS ABS: 0.1 10*3/uL (ref 0.0–0.5)
EOS%: 1.1 % (ref 0.0–7.0)
HCT: 46.1 % (ref 34.8–46.6)
HEMOGLOBIN: 15.7 g/dL (ref 11.6–15.9)
LYMPH%: 20 % (ref 14.0–49.7)
MCH: 33.3 pg (ref 25.1–34.0)
MCHC: 34 g/dL (ref 31.5–36.0)
MCV: 97.8 fL (ref 79.5–101.0)
MONO#: 0.6 10*3/uL (ref 0.1–0.9)
MONO%: 6.1 % (ref 0.0–14.0)
NEUT#: 7.1 10*3/uL — ABNORMAL HIGH (ref 1.5–6.5)
NEUT%: 72.1 % (ref 38.4–76.8)
Platelets: 197 10*3/uL (ref 145–400)
RBC: 4.71 10*6/uL (ref 3.70–5.45)
RDW: 13.8 % (ref 11.2–14.5)
WBC: 9.9 10*3/uL (ref 3.9–10.3)
lymph#: 2 10*3/uL (ref 0.9–3.3)

## 2016-10-18 LAB — COMPREHENSIVE METABOLIC PANEL
ALBUMIN: 3.6 g/dL (ref 3.5–5.0)
ALK PHOS: 86 U/L (ref 40–150)
ALT: 18 U/L (ref 0–55)
AST: 23 U/L (ref 5–34)
Anion Gap: 9 mEq/L (ref 3–11)
BUN: 13.4 mg/dL (ref 7.0–26.0)
CHLORIDE: 108 meq/L (ref 98–109)
CO2: 22 meq/L (ref 22–29)
Calcium: 9.4 mg/dL (ref 8.4–10.4)
Creatinine: 0.8 mg/dL (ref 0.6–1.1)
EGFR: 86 mL/min/{1.73_m2} — AB (ref >90)
GLUCOSE: 125 mg/dL (ref 70–140)
POTASSIUM: 4.4 meq/L (ref 3.5–5.1)
SODIUM: 140 meq/L (ref 136–145)
Total Bilirubin: 0.36 mg/dL (ref 0.20–1.20)
Total Protein: 7.2 g/dL (ref 6.4–8.3)

## 2016-10-18 LAB — TSH: TSH: 0.178 m(IU)/L — ABNORMAL LOW (ref 0.308–3.960)

## 2016-10-18 MED ORDER — SODIUM CHLORIDE 0.9 % IV SOLN
Freq: Once | INTRAVENOUS | Status: AC
  Administered 2016-10-18: 12:00:00 via INTRAVENOUS

## 2016-10-18 MED ORDER — SODIUM CHLORIDE 0.9 % IV SOLN
200.0000 mg | Freq: Once | INTRAVENOUS | Status: AC
  Administered 2016-10-18: 200 mg via INTRAVENOUS
  Filled 2016-10-18: qty 8

## 2016-10-18 NOTE — Progress Notes (Signed)
[facility] [phone]   Fax:([phone]) [place], [place] [address]  DIAGNOSIS: Recurrent non-small cell lung cancer, adenocarcinoma with PDL 1 expression of 60% diagnosed in November 2016. The patient was initially diagnosed as stage IB (T2a, N0, M0) adenocarcinoma in March 2016.  PRIOR THERAPY:  Status post left video-assisted thoracoscopy, wedge resection left lower lobe, thoracoscopic left lower lobectomy and mediastinal lymph node dissection on 08/14/2014.   CURRENT THERAPY: Immunotherapy with Ketruda (pembrolizumab) 200 mg IV every 3 weeks status post 25 cycles. First dose was given 05/12/2015.   INTERVAL HISTORY: Sharon [name] 56 y.o. female returns to the clinic today for follow-up visit accompanied by her daughter. The patient is feeling very well today with no specific complaints. She denied having any chest pain, shortness of breath, cough or hemoptysis. She has no fever or chills. She has no significant weight loss or night sweats. She denied having any nausea, vomiting, diarrhea or constipation. She is tolerating her treatment with [place] fairly well. She is here today for evaluation before starting cycle #26.  MEDICAL HISTORY: Past Medical History:  Diagnosis Date  . Aortic insufficiency    Echo 8/09:  EF 55-60, impaired relaxation, mild aortic stenosis (mean 8.1), moderate aortic insufficiency, trace MR, trace TR // Echo 4/18: mild LVH, EF 55-60, no RWMA, Gr 1 DD, mod AI   . Arthritis   . Carotid artery disease ([facility]) 03/09/2011   Carotid US 8/15 - R 40-59; L1-39 >> follow-up 1 year // Carotid US 4/18: ICA 1-39 bilaterally - repeat 1 yr  . COPD (chronic obstructive pulmonary disease) ([facility])   . Coronary artery disease involving native coronary artery of native heart without angina pectoris 11/24/2009   s/p ant STEMI 5/09 >> LHC 5/09 - LM normal; LAD proximal 99; branching DX with one  branch occluded and the other severely diseased; LCx okay with irregularities in the OM1; RCA proximal occluded-CTO; EF 30-40, anterior wall HK  >>  PCI: 3 x 18 mm Promus DES, 2.5 x 15 mm Promus DES to the LAD  . Emphysema of lung ([facility])   . Headache 09/06/2016  . History of acute anterior wall MI 12/2007   s/p DES x 2 to LAD  . Hyperlipidemia   . Ischemic cardiomyopathy   . Non-small cell lung cancer ([facility])    per CT CHEST/PET 2/4 and 07/18/14 @ [facility]  . Pneumonia    2015   HX BRONCHITIS    . Tobacco user     ALLERGIES:  is allergic to codeine; lipitor [atorvastatin calcium]; morphine and related; and oxycodone-acetaminophen.  MEDICATIONS:  Current Outpatient Prescriptions  Medication Sig Dispense Refill  . aspirin (ASPIR-LOW) 81 MG EC tablet Take 81 mg by mouth daily. On hold    . clopidogrel (PLAVIX) 75 MG tablet Take 1 tablet (75 mg total) by mouth daily. 90 tablet 3  . levothyroxine (SYNTHROID, LEVOTHROID) 125 MCG tablet Take 1 tablet (125 mcg total) by mouth daily before breakfast. 30 tablet 0  . Magnesium 250 MG TABS Take 250 mg by mouth daily.    . Pembrolizumab (KEYTRUDA IV) TAKE KEYTRUDA  IV INFUSION ONCE EVERY 3 WEEKS FOR CHEMO( PT. NOT SURE OF DOSAGE)    . potassium chloride (MICRO-K) 10 MEQ CR capsule Take 10 mEq by mouth 2 (two) times daily.  0  . rosuvastatin (CRESTOR) 40 MG tablet Take 1 tablet (40 mg total)  by mouth daily. 90 tablet 3  . ALPRAZolam (XANAX) 0.25 MG tablet Take 0.25 mg by mouth as needed for anxiety or sleep.   0  . ondansetron (ZOFRAN) 8 MG tablet TAKE 1 TABLET (8 MG TOTAL) BY MOUTH EVERY 8 (EIGHT) HOURS AS NEEDED FOR NAUSEA OR VOMITING. (Patient not taking: Reported on 10/18/2016) 30 tablet 0  . traMADol (ULTRAM) 50 MG tablet Take 50 mg by mouth every 6 (six) hours as needed for moderate pain or severe pain.     No current facility-administered medications for this visit.     SURGICAL HISTORY:  Past Surgical History:  Procedure Laterality Date  .  CARDIAC CATHETERIZATION  10/15/2007   EF 30-40%  . CESAREAN SECTION    . CHOLECYSTECTOMY    . CORONARY ANGIOPLASTY WITH STENT PLACEMENT     LAD  . ectopic pregnancies requiring laparotomies    . LEAD REMOVAL Left 08/14/2014   Procedure: CRYO INTERCOSTAL NERVE BLOCK;  Surgeon: Melrose Nakayama, MD;  Location: [facility];  Service: Thoracic;  Laterality: Left;  . LOBECTOMY Left 08/14/2014   Procedure: LEFT LOWER LOBECTOMY;  Surgeon: Melrose Nakayama, MD;  Location: [facility];  Service: Thoracic;  Laterality: Left;  . NODE DISSECTION Left 08/14/2014   Procedure: NODE DISSECTION, LEFT LOWER LOBE LUNG;  Surgeon: Melrose Nakayama, MD;  Location: [facility];  Service: Thoracic;  Laterality: Left;  . SHOULDER SURGERY     LEFT X 2   (REMOVED SOME COLLAR BONE )  . TRANSTHORACIC ECHOCARDIOGRAM  10/17/2007  . TUBAL LIGATION    . US ECHOCARDIOGRAPHY  01/07/2008   EF 55-60%  . VIDEO ASSISTED THORACOSCOPY (VATS)/WEDGE RESECTION Left 08/14/2014   Procedure: VIDEO ASSISTED THORACOSCOPY (VATS)/WEDGE RESECTION;  Surgeon: Melrose Nakayama, MD;  Location: [facility];  Service: Thoracic;  Laterality: Left;    REVIEW OF SYSTEMS:  A comprehensive review of systems was negative.   PHYSICAL EXAMINATION: General appearance: alert, cooperative and no distress Head: Normocephalic, without obvious abnormality, atraumatic Neck: no adenopathy, no JVD, supple, symmetrical, trachea midline and thyroid not enlarged, symmetric, no tenderness/mass/nodules Lymph nodes: Cervical, supraclavicular, and axillary nodes normal. Resp: clear to auscultation bilaterally Back: negative Cardio: regular rate and rhythm, S1, S2 normal, no murmur, click, rub or gallop GI: soft, non-tender; bowel sounds normal; no masses,  no organomegaly Extremities: extremities normal, atraumatic, no cyanosis or edema  ECOG PERFORMANCE STATUS: 1 - Symptomatic but completely ambulatory  Blood pressure 121/70, pulse 71, temperature 98.2 F (36.8 C),  temperature source Oral, resp. rate 17, height '5\' 4"'$  (1.626 m), weight 140 lb 12.8 oz (63.9 kg), SpO2 96 %.  LABORATORY DATA: Lab Results  Component Value Date   WBC 9.9 10/18/2016   HGB 15.7 10/18/2016   HCT 46.1 10/18/2016   MCV 97.8 10/18/2016   PLT 197 10/18/2016      Chemistry      Component Value Date/Time   NA 140 09/27/2016 0956   K 4.0 09/27/2016 0956   CL 109 08/16/2014 0520   CO2 22 09/27/2016 0956   BUN 10.9 09/27/2016 0956   CREATININE 0.7 09/27/2016 0956      Component Value Date/Time   CALCIUM 9.4 09/27/2016 0956   ALKPHOS 86 09/27/2016 0956   AST 20 09/27/2016 0956   ALT 14 09/27/2016 0956   BILITOT 0.36 09/27/2016 0956       RADIOGRAPHIC STUDIES: No results found.  ASSESSMENT AND PLAN:  This is a very pleasant 56 years old white female with recurrent  non-small cell lung cancer, adenocarcinoma with positive PDL 1 expression of 60%. The patient is currently undergoing treatment with [place] every 3 weeks is status post 25 cycles and has been tolerating her treatment fairly well. I recommended for the patient to proceed with cycle #26 today as a scheduled. I will see her back for follow-up visit in 3 weeks for evaluation before starting cycle #27. She was advised to call immediately if she has any concerning symptoms in the interval. The patient voices understanding of current disease status and treatment options and is in agreement with the current care plan. All questions were answered. The patient knows to call the clinic with any problems, questions or concerns. We can certainly see the patient much sooner if necessary. I spent 10 minutes counseling the patient face to face. The total time spent in the appointment was 15 minutes.  Disclaimer: This note was dictated with voice recognition software. Similar sounding words can inadvertently be transcribed and may not be corrected upon review.

## 2016-10-18 NOTE — Patient Instructions (Signed)
Coping with Quitting Smoking Quitting smoking is a physical and mental challenge. You will face cravings, withdrawal symptoms, and temptation. Before quitting, work with your health care provider to make a plan that can help you cope. Preparation can help you quit and keep you from giving in. How can I cope with cravings? Cravings usually last for 5-10 minutes. If you get through it, the craving will pass. Consider taking the following actions to help you cope with cravings:  Keep your mouth busy:  Chew sugar-free gum.  Suck on hard candies or a straw.  Brush your teeth.  Keep your hands and body busy:  Immediately change to a different activity when you feel a craving.  Squeeze or play with a ball.  Do an activity or a hobby, like making bead jewelry, practicing needlepoint, or working with wood.  Mix up your normal routine.  Take a short exercise break. Go for a quick walk or run up and down stairs.  Spend time in public places where smoking is not allowed.  Focus on doing something kind or helpful for someone else.  Call a friend or family member to talk during a craving.  Join a support group.  Call a quit line, such as 1-800-QUIT-NOW.  Talk with your health care provider about medicines that might help you cope with cravings and make quitting easier for you. How can I deal with withdrawal symptoms? Your body may experience negative effects as it tries to get used to not having nicotine in the system. These effects are called withdrawal symptoms. They may include:  Feeling hungrier than normal.  Trouble concentrating.  Irritability.  Trouble sleeping.  Feeling depressed.  Restlessness and agitation.  Craving a cigarette. 1.  To manage withdrawal symptoms:  Avoid places, people, and activities that trigger your cravings.  Remember why you want to quit.  Get plenty of sleep.  Avoid coffee and other caffeinated drinks. These may worsen some of your  symptoms. How can I handle social situations? Social situations can be difficult when you are quitting smoking, especially in the first few weeks. To manage this, you can:  Avoid parties, bars, and other social situations where people might be smoking.  Avoid alcohol.  Leave right away if you have the urge to smoke.  Explain to your family and friends that you are quitting smoking. Ask for understanding and support.  Plan activities with friends or family where smoking is not an option. What are some ways I can cope with stress? Wanting to smoke may cause stress, and stress can make you want to smoke. Find ways to manage your stress. Relaxation techniques can help. For example:  Breathe slowly and deeply, in through your nose and out through your mouth.  Listen to soothing, relaxing music.  Talk with a family member or friend about your stress.  Light a candle.  Soak in a bath or take a shower.  Think about a peaceful place. What are some ways I can prevent weight gain? Be aware that many people gain weight after they quit smoking. However, not everyone does. To keep from gaining weight, have a plan in place before you quit and stick to the plan after you quit. Your plan should include:  Having healthy snacks. When you have a craving, it may help to:  Eat plain popcorn, crunchy carrots, celery, or other cut vegetables.  Chew sugar-free gum.  Changing how you eat:  Eat small portion sizes at meals.  Eat 4-6 small  meals throughout the day instead of 1-2 large meals a day.  Be mindful when you eat. Do not watch television or do other things that might distract you as you eat.  Exercising regularly:  Make time to exercise each day. If you do not have time for a long workout, do short bouts of exercise for 5-10 minutes several times a day.  Do some form of strengthening exercise, like weight lifting, and some form of aerobic exercise, like running or swimming.  Drinking  plenty of water or other low-calorie or no-calorie drinks. Drink 6-8 glasses of water daily, or as much as instructed by your health care provider. Summary  Quitting smoking is a physical and mental challenge. You will face cravings, withdrawal symptoms, and temptation to smoke again. Preparation can help you as you go through these challenges.  You can cope with cravings by keeping your mouth busy (such as by chewing gum), keeping your body and hands busy, and making calls to family, friends, or a helpline for people who want to quit smoking.  You can cope with withdrawal symptoms by avoiding places where people smoke, avoiding drinks with caffeine, and getting plenty of rest.  Ask your health care provider about the different ways to prevent weight gain, avoid stress, and handle social situations. This information is not intended to replace advice given to you by your health care provider. Make sure you discuss any questions you have with your health care provider. Document Released: 05/13/2016 Document Revised: 05/13/2016 Document Reviewed: 05/13/2016 Elsevier Interactive Patient Education  2017 Elsevier Inc.  

## 2016-10-18 NOTE — Patient Instructions (Signed)
[facility] Discharge Instructions for Patients Receiving Chemotherapy  Today you received the following chemotherapy agents: Keytruda   To help prevent nausea and vomiting after your treatment, we encourage you to take your nausea medication as directed    If you develop nausea and vomiting that is not controlled by your nausea medication, call the clinic.   BELOW ARE SYMPTOMS THAT SHOULD BE REPORTED IMMEDIATELY:  *FEVER GREATER THAN 100.5 F  *CHILLS WITH OR WITHOUT FEVER  NAUSEA AND VOMITING THAT IS NOT CONTROLLED WITH YOUR NAUSEA MEDICATION  *UNUSUAL SHORTNESS OF BREATH  *UNUSUAL BRUISING OR BLEEDING  TENDERNESS IN MOUTH AND THROAT WITH OR WITHOUT PRESENCE OF ULCERS  *URINARY PROBLEMS  *BOWEL PROBLEMS  UNUSUAL RASH Items with * indicate a potential emergency and should be followed up as soon as possible.  Feel free to call the clinic you have any questions or concerns. The clinic phone number is [phone].  Please show the CHEMO ALERT CARD at check-in to the Emergency Department and triage nurse.   

## 2016-10-19 ENCOUNTER — Telehealth: Payer: Self-pay | Admitting: Internal Medicine

## 2016-10-19 NOTE — Telephone Encounter (Signed)
Scheduled appt per 5/22 LOS- left message with that appts have been added per LOS - patient to pick up new schedule next visit.

## 2016-10-27 IMAGING — CT CT BIOPSY
1 of 5 series · 12 of 32 positions shown, 18 images · non-contrast
Comparison: none

CLINICAL DATA: 54-year-old female with a history of left lung
poorly differentiated adenocarcinoma which was surgically resected.
Now she has multiple small pleural-based nodules which demonstrate
hypermetabolic activity on PET-CT. Findings are concerning for
recurrent lung adenocarcinoma. CT-guided biopsy is warranted to
confirm tissue diagnosis.
TECHNIQUE: Informed consent was obtained from the patient following explanation
of the procedure, risks, benefits and alternatives. The patient
understands, agrees and consents for the procedure. All questions
were addressed. A time out was performed.

[Series 7: i-spiral 5.0 b40f · axial · 0.90mm/px · z∈[+1105,+1336]mm · 12 of 78 slices shown, 18 images]
[im 6/78  soft-tissue]
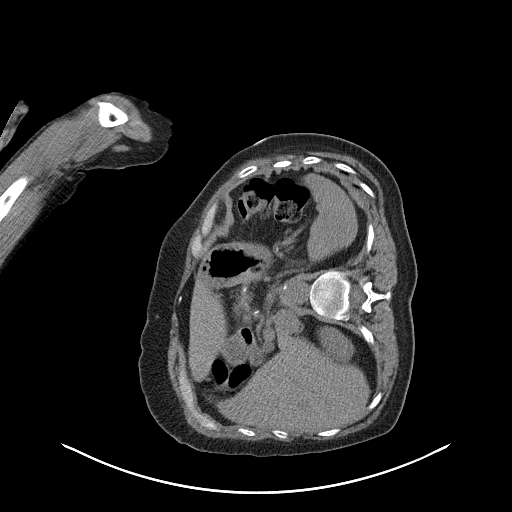
[im 6/78  bone]
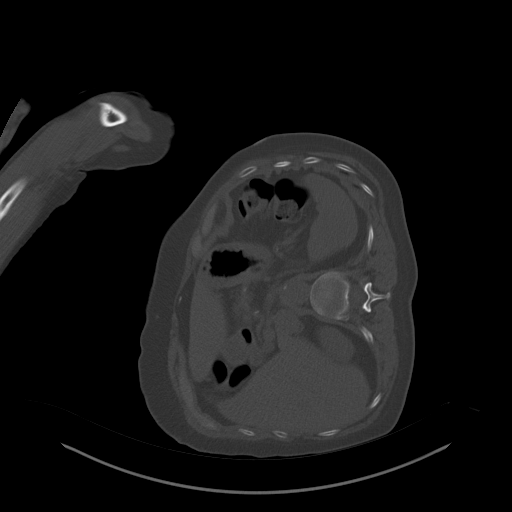
[im 12/78  soft-tissue]
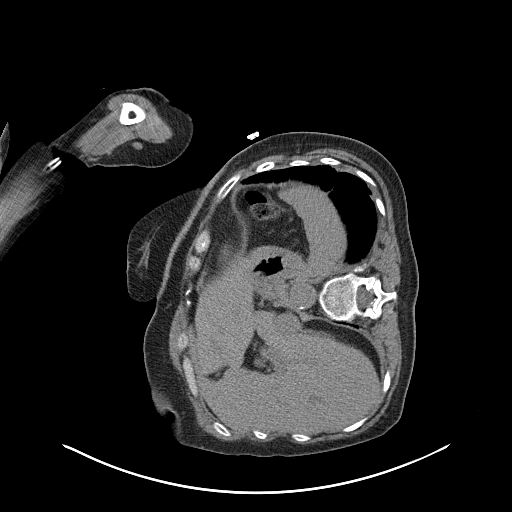
[im 17/78  soft-tissue]
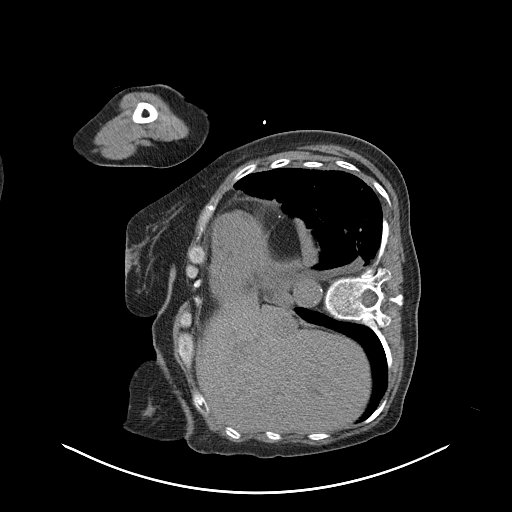
[im 23/78  soft-tissue]
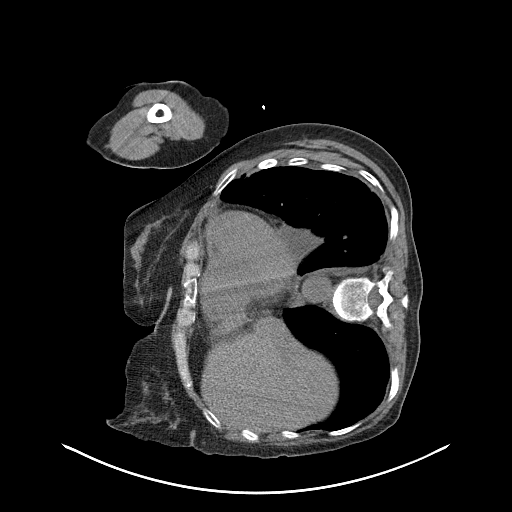
[im 28/78  soft-tissue]
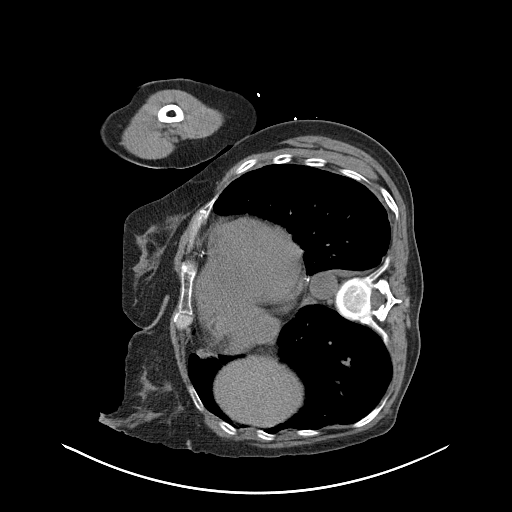
[im 34/78  soft-tissue]
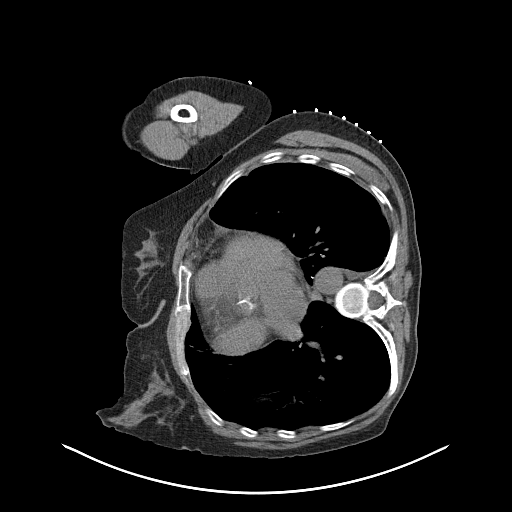
[im 45/78  soft-tissue]
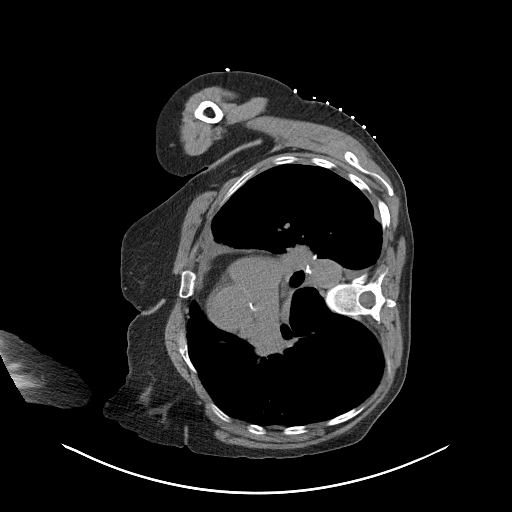
[im 50/78  soft-tissue]
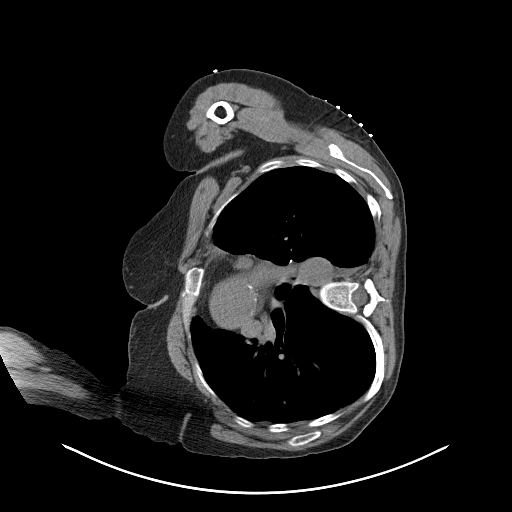
[im 56/78  soft-tissue]
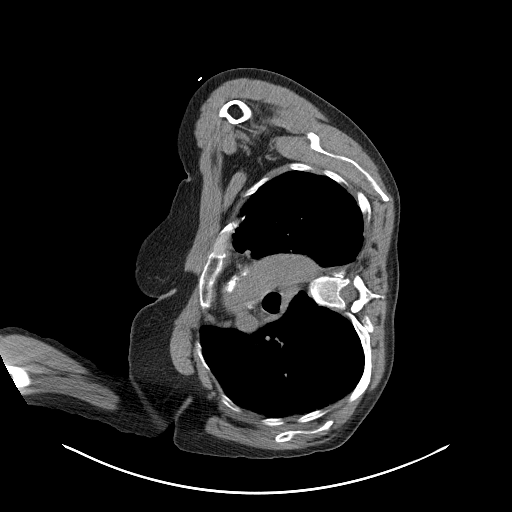
[im 56/78  lung]
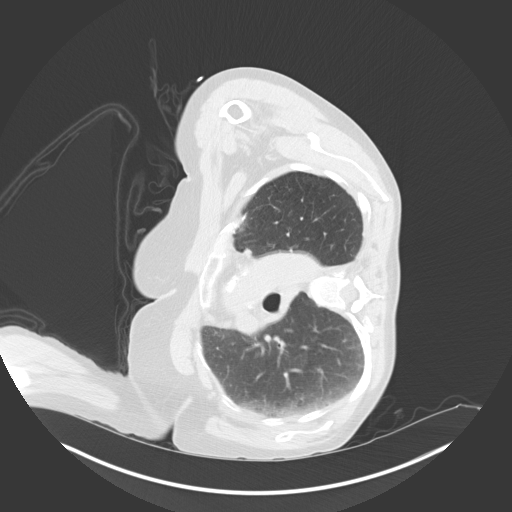
[im 56/78  bone]
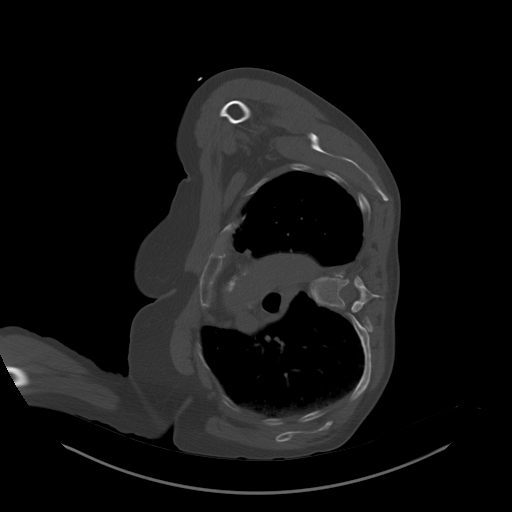
[im 61/78  soft-tissue]
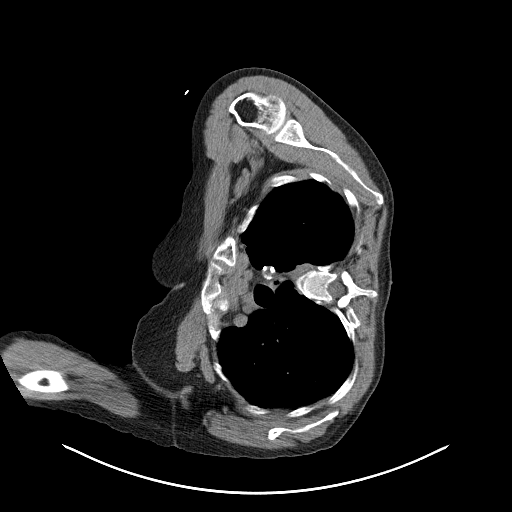
[im 61/78  lung]
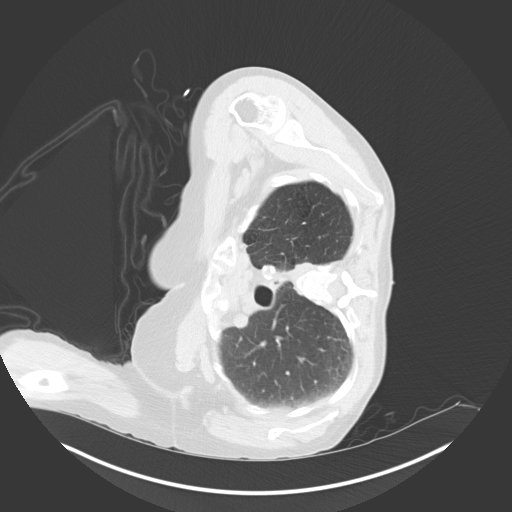
[im 67/78  soft-tissue]
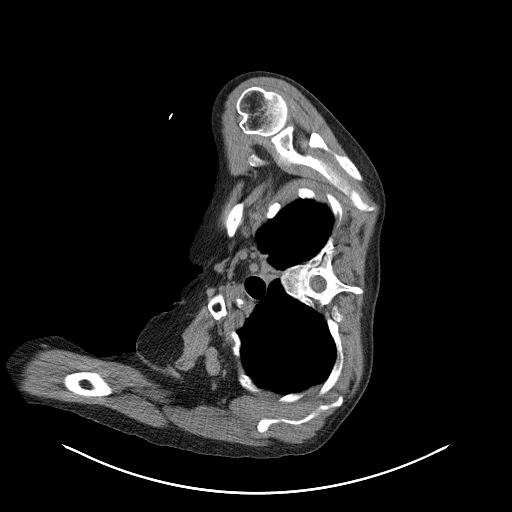
[im 67/78  lung]
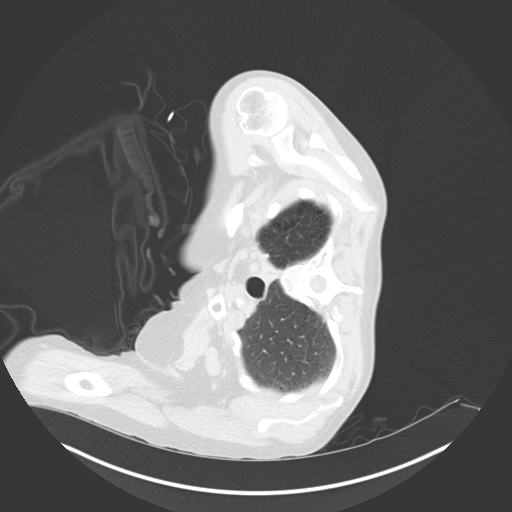
[im 72/78  soft-tissue]
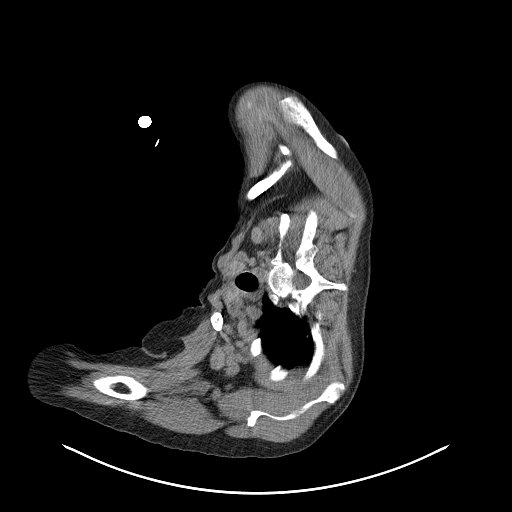
[im 72/78  lung]
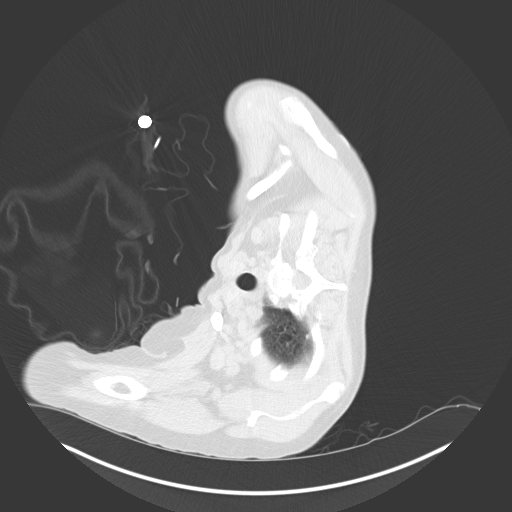

[12 of 32 positions shown; findings below may reference images not displayed]

EXAM:
CT BIOPSY

Date: 04/30/2015

PROCEDURE:
1. CT-guided biopsy left lower lobe pleural-based nodule

ANESTHESIA/SEDATION:
Moderate (conscious) sedation was used. 2 mg Versed, 150 mcg
Fentanyl were administered intravenously. The patient's vital signs
were monitored continuously by radiology nursing throughout the
procedure.

Sedation Time: 45 minutes

MEDICATIONS:
None additional
A planning axial CT scan was performed. The nodule in the pleura
overlying the left lower lobe was successfully identified. A
suitable skin entry site was selected and marked. The region was
then sterilely prepped and draped in standard fashion using Betadine
skin prep. Local anesthesia was attained by infiltration with 1%
lidocaine. A small dermatotomy was made. Under intermittent CT
fluoroscopic guidance, a 17 gauge trocar needle was advanced into
the lung and positioned at the margin of the nodule.

A single 18 gauge core biopsy was then coaxially obtained using the
BioPince automated biopsy device. Immediately following a single
pass of a biopsy needle there was significant collapse of the left
lung. Therefore, the 17 gauge introducer needle was exchanged over
an Amplatz wire for a 10 cm 5 Ali El Hadi Saydah centesis catheter. The
centesis catheter was hooked up to a three-way stopcock an the
pneumothorax evacuation. Once the pleural was re-apposed, a 10 mL
aliquot of autologous blood drawn for peripheral IV was injected
into the pleural space to serve as a pleural blood patch. Biopsy
specimens were placed in formalin and delivered to pathology for
further analysis. After 10 minutes, repeat CT imaging was performed
demonstrating continued absence of significant pneumothorax. There
is a trace basilar pneumothorax which is insignificant. The patient
tolerated the procedure well.

No evidence of recurrent pneumothorax at the 2-hour post biopsy
chest x-ray.

COMPLICATIONS:
SIR level B: Nominal therapy (including overnight admission for
observation), no consequence.

Estimated blood loss:  0
IMPRESSION: Technically successful CT-guided biopsy left lower lobe
pleural-based pulmonary nodule.

## 2016-11-08 ENCOUNTER — Telehealth: Payer: Self-pay | Admitting: Internal Medicine

## 2016-11-08 ENCOUNTER — Ambulatory Visit (HOSPITAL_BASED_OUTPATIENT_CLINIC_OR_DEPARTMENT_OTHER): Payer: BLUE CROSS/BLUE SHIELD | Admitting: Internal Medicine

## 2016-11-08 ENCOUNTER — Encounter: Payer: Self-pay | Admitting: Internal Medicine

## 2016-11-08 ENCOUNTER — Other Ambulatory Visit (HOSPITAL_BASED_OUTPATIENT_CLINIC_OR_DEPARTMENT_OTHER): Payer: BLUE CROSS/BLUE SHIELD

## 2016-11-08 ENCOUNTER — Ambulatory Visit (HOSPITAL_BASED_OUTPATIENT_CLINIC_OR_DEPARTMENT_OTHER): Payer: BLUE CROSS/BLUE SHIELD

## 2016-11-08 VITALS — BP 145/54 | HR 57 | Temp 98.1°F | Resp 18 | Ht 64.0 in | Wt 141.9 lb

## 2016-11-08 DIAGNOSIS — Z5112 Encounter for antineoplastic immunotherapy: Secondary | ICD-10-CM

## 2016-11-08 DIAGNOSIS — R110 Nausea: Secondary | ICD-10-CM | POA: Diagnosis not present

## 2016-11-08 DIAGNOSIS — C3412 Malignant neoplasm of upper lobe, left bronchus or lung: Secondary | ICD-10-CM

## 2016-11-08 DIAGNOSIS — C3432 Malignant neoplasm of lower lobe, left bronchus or lung: Secondary | ICD-10-CM

## 2016-11-08 DIAGNOSIS — E039 Hypothyroidism, unspecified: Secondary | ICD-10-CM

## 2016-11-08 DIAGNOSIS — R5382 Chronic fatigue, unspecified: Secondary | ICD-10-CM

## 2016-11-08 DIAGNOSIS — Z79899 Other long term (current) drug therapy: Secondary | ICD-10-CM

## 2016-11-08 LAB — COMPREHENSIVE METABOLIC PANEL
ALT: 15 U/L (ref 0–55)
AST: 22 U/L (ref 5–34)
Albumin: 3.7 g/dL (ref 3.5–5.0)
Alkaline Phosphatase: 88 U/L (ref 40–150)
Anion Gap: 9 mEq/L (ref 3–11)
BILIRUBIN TOTAL: 0.4 mg/dL (ref 0.20–1.20)
BUN: 10.6 mg/dL (ref 7.0–26.0)
CO2: 22 meq/L (ref 22–29)
Calcium: 9.5 mg/dL (ref 8.4–10.4)
Chloride: 111 mEq/L — ABNORMAL HIGH (ref 98–109)
Creatinine: 0.8 mg/dL (ref 0.6–1.1)
EGFR: 87 mL/min/{1.73_m2} — AB (ref >90)
GLUCOSE: 117 mg/dL (ref 70–140)
Potassium: 3.7 mEq/L (ref 3.5–5.1)
SODIUM: 141 meq/L (ref 136–145)
TOTAL PROTEIN: 7.3 g/dL (ref 6.4–8.3)

## 2016-11-08 LAB — CBC WITH DIFFERENTIAL/PLATELET
BASO%: 0.9 % (ref 0.0–2.0)
Basophils Absolute: 0.1 10*3/uL (ref 0.0–0.1)
EOS%: 1.6 % (ref 0.0–7.0)
Eosinophils Absolute: 0.1 10*3/uL (ref 0.0–0.5)
HCT: 46.8 % — ABNORMAL HIGH (ref 34.8–46.6)
HEMOGLOBIN: 15.8 g/dL (ref 11.6–15.9)
LYMPH%: 34.2 % (ref 14.0–49.7)
MCH: 32.8 pg (ref 25.1–34.0)
MCHC: 33.8 g/dL (ref 31.5–36.0)
MCV: 97.3 fL (ref 79.5–101.0)
MONO#: 0.4 10*3/uL (ref 0.1–0.9)
MONO%: 7.7 % (ref 0.0–14.0)
NEUT%: 55.6 % (ref 38.4–76.8)
NEUTROS ABS: 3.2 10*3/uL (ref 1.5–6.5)
Platelets: 220 10*3/uL (ref 145–400)
RBC: 4.81 10*6/uL (ref 3.70–5.45)
RDW: 14.2 % (ref 11.2–14.5)
WBC: 5.7 10*3/uL (ref 3.9–10.3)
lymph#: 2 10*3/uL (ref 0.9–3.3)

## 2016-11-08 LAB — TSH: TSH: 0.163 m(IU)/L — ABNORMAL LOW (ref 0.308–3.960)

## 2016-11-08 MED ORDER — SODIUM CHLORIDE 0.9 % IV SOLN
200.0000 mg | Freq: Once | INTRAVENOUS | Status: AC
  Administered 2016-11-08: 200 mg via INTRAVENOUS
  Filled 2016-11-08: qty 8

## 2016-11-08 MED ORDER — SODIUM CHLORIDE 0.9 % IV SOLN
Freq: Once | INTRAVENOUS | Status: AC
  Administered 2016-11-08: 11:00:00 via INTRAVENOUS

## 2016-11-08 NOTE — Telephone Encounter (Signed)
Scheduled additional cycle per 6/12 los - patient to receive calender in the treatment area

## 2016-11-08 NOTE — Patient Instructions (Signed)
Steps to Quit Smoking Smoking tobacco can be bad for your health. It can also affect almost every organ in your body. Smoking puts you and people around you at risk for many serious long-lasting (chronic) diseases. Quitting smoking is hard, but it is one of the best things that you can do for your health. It is never too late to quit. What are the benefits of quitting smoking? When you quit smoking, you lower your risk for getting serious diseases and conditions. They can include:  Lung cancer or lung disease.  Heart disease.  Stroke.  Heart attack.  Not being able to have children (infertility).  Weak bones (osteoporosis) and broken bones (fractures).  If you have coughing, wheezing, and shortness of breath, those symptoms may get better when you quit. You may also get sick less often. If you are pregnant, quitting smoking can help to lower your chances of having a baby of low birth weight. What can I do to help me quit smoking? Talk with your doctor about what can help you quit smoking. Some things you can do (strategies) include:  Quitting smoking totally, instead of slowly cutting back how much you smoke over a period of time.  Going to in-person counseling. You are more likely to quit if you go to many counseling sessions.  Using resources and support systems, such as: ? Online chats with a counselor. ? Phone quitlines. ? Printed self-help materials. ? Support groups or group counseling. ? Text messaging programs. ? Mobile phone apps or applications.  Taking medicines. Some of these medicines may have nicotine in them. If you are pregnant or breastfeeding, do not take any medicines to quit smoking unless your doctor says it is okay. Talk with your doctor about counseling or other things that can help you.  Talk with your doctor about using more than one strategy at the same time, such as taking medicines while you are also going to in-person counseling. This can help make  quitting easier. What things can I do to make it easier to quit? Quitting smoking might feel very hard at first, but there is a lot that you can do to make it easier. Take these steps:  Talk to your family and friends. Ask them to support and encourage you.  Call phone quitlines, reach out to support groups, or work with a counselor.  Ask people who smoke to not smoke around you.  Avoid places that make you want (trigger) to smoke, such as: ? Bars. ? Parties. ? Smoke-break areas at work.  Spend time with people who do not smoke.  Lower the stress in your life. Stress can make you want to smoke. Try these things to help your stress: ? Getting regular exercise. ? Deep-breathing exercises. ? Yoga. ? Meditating. ? Doing a body scan. To do this, close your eyes, focus on one area of your body at a time from head to toe, and notice which parts of your body are tense. Try to relax the muscles in those areas.  Download or buy apps on your mobile phone or tablet that can help you stick to your quit plan. There are many free apps, such as QuitGuide from the CDC (Centers for Disease Control and Prevention). You can find more support from smokefree.gov and other websites.  This information is not intended to replace advice given to you by your health care provider. Make sure you discuss any questions you have with your health care provider. Document Released: 03/12/2009 Document   Revised: 01/12/2016 Document Reviewed: 09/30/2014 Elsevier Interactive Patient Education  2018 Elsevier Inc.  

## 2016-11-08 NOTE — Progress Notes (Signed)
[facility] Telephone:(336) [phone]   Fax:(336) [place], NP [place] [address]  DIAGNOSIS: Recurrent non-small cell lung cancer, adenocarcinoma with PDL 1 expression of 60% diagnosed in November 2016. The patient was initially diagnosed as stage IB (T2a, N0, M0) adenocarcinoma in March 2016.  PRIOR THERAPY:  Status post left video-assisted thoracoscopy, wedge resection left lower lobe, thoracoscopic left lower lobectomy and mediastinal lymph node dissection on 08/14/2014.   CURRENT THERAPY: Immunotherapy with Ketruda (pembrolizumab) 200 mg IV every 3 weeks status post 26 cycles. First dose was given 05/12/2015.   INTERVAL HISTORY: Sharon [name] 56 y.o. female returns to the clinic today for follow-up visit accompanied by her daughter Janett Billow. The patient is feeling fine today with no specific complaints except for few episodes of nausea recently. She moved to new apartment and she thinks the new painting is contributing to her nausea. She denied having any chest pain, shortness of breath, cough or hemoptysis. She denied having any fever or chills. She has no weight loss or night sweats. She is tolerating her current treatment with immunotherapy fairly well. She is here today for evaluation before starting cycle #27.  MEDICAL HISTORY: Past Medical History:  Diagnosis Date  . Aortic insufficiency    Echo 8/09:  EF 55-60, impaired relaxation, mild aortic stenosis (mean 8.1), moderate aortic insufficiency, trace MR, trace TR // Echo 4/18: mild LVH, EF 55-60, no RWMA, Gr 1 DD, mod AI   . Arthritis   . Carotid artery disease ([place]) 03/09/2011   Carotid US 8/15 - R 40-59; L1-39 >> follow-up 1 year // Carotid US 4/18: ICA 1-39 bilaterally - repeat 1 yr  . COPD (chronic obstructive pulmonary disease) ([place])   . Coronary artery disease involving native coronary artery of native heart without angina pectoris 11/24/2009   s/p  ant STEMI 5/09 >> LHC 5/09 - LM normal; LAD proximal 99; branching DX with one branch occluded and the other severely diseased; LCx okay with irregularities in the OM1; RCA proximal occluded-CTO; EF 30-40, anterior wall HK  >>  PCI: 3 x 18 mm Promus DES, 2.5 x 15 mm Promus DES to the LAD  . Emphysema of lung ([place])   . Headache 09/06/2016  . History of acute anterior wall MI 12/2007   s/p DES x 2 to LAD  . Hyperlipidemia   . Ischemic cardiomyopathy   . Non-small cell lung cancer ([place])    per CT CHEST/PET 2/4 and 07/18/14 @ [facility]  . Pneumonia    2015   HX BRONCHITIS    . Tobacco user     ALLERGIES:  is allergic to codeine; lipitor [atorvastatin calcium]; morphine and related; and oxycodone-acetaminophen.  MEDICATIONS:  Current Outpatient Prescriptions  Medication Sig Dispense Refill  . ALPRAZolam (XANAX) 0.25 MG tablet Take 0.25 mg by mouth as needed for anxiety or sleep.   0  . aspirin (ASPIR-LOW) 81 MG EC tablet Take 81 mg by mouth daily. On hold    . clopidogrel (PLAVIX) 75 MG tablet Take 1 tablet (75 mg total) by mouth daily. 90 tablet 3  . levothyroxine (SYNTHROID, LEVOTHROID) 125 MCG tablet Take 1 tablet (125 mcg total) by mouth daily before breakfast. 30 tablet 0  . Magnesium 250 MG TABS Take 250 mg by mouth daily.    . ondansetron (ZOFRAN) 8 MG tablet TAKE 1 TABLET (8 MG TOTAL) BY MOUTH EVERY 8 (EIGHT) HOURS AS  NEEDED FOR NAUSEA OR VOMITING. (Patient not taking: Reported on 10/18/2016) 30 tablet 0  . Pembrolizumab (KEYTRUDA IV) TAKE KEYTRUDA  IV INFUSION ONCE EVERY 3 WEEKS FOR CHEMO( PT. NOT SURE OF DOSAGE)    . potassium chloride (MICRO-K) 10 MEQ CR capsule Take 10 mEq by mouth 2 (two) times daily.  0  . rosuvastatin (CRESTOR) 40 MG tablet Take 1 tablet (40 mg total) by mouth daily. 90 tablet 3  . traMADol (ULTRAM) 50 MG tablet Take 50 mg by mouth every 6 (six) hours as needed for moderate pain or severe pain.     No current facility-administered medications for this visit.      SURGICAL HISTORY:  Past Surgical History:  Procedure Laterality Date  . CARDIAC CATHETERIZATION  10/15/2007   EF 30-40%  . CESAREAN SECTION    . CHOLECYSTECTOMY    . CORONARY ANGIOPLASTY WITH STENT PLACEMENT     LAD  . ectopic pregnancies requiring laparotomies    . LEAD REMOVAL Left 08/14/2014   Procedure: CRYO INTERCOSTAL NERVE BLOCK;  Surgeon: Melrose Nakayama, MD;  Location: [facility];  Service: Thoracic;  Laterality: Left;  . LOBECTOMY Left 08/14/2014   Procedure: LEFT LOWER LOBECTOMY;  Surgeon: Melrose Nakayama, MD;  Location: [facility];  Service: Thoracic;  Laterality: Left;  . NODE DISSECTION Left 08/14/2014   Procedure: NODE DISSECTION, LEFT LOWER LOBE LUNG;  Surgeon: Melrose Nakayama, MD;  Location: [facility];  Service: Thoracic;  Laterality: Left;  . SHOULDER SURGERY     LEFT X 2   (REMOVED SOME COLLAR BONE )  . TRANSTHORACIC ECHOCARDIOGRAM  10/17/2007  . TUBAL LIGATION    . US ECHOCARDIOGRAPHY  01/07/2008   EF 55-60%  . VIDEO ASSISTED THORACOSCOPY (VATS)/WEDGE RESECTION Left 08/14/2014   Procedure: VIDEO ASSISTED THORACOSCOPY (VATS)/WEDGE RESECTION;  Surgeon: Melrose Nakayama, MD;  Location: [facility];  Service: Thoracic;  Laterality: Left;    REVIEW OF SYSTEMS:  A comprehensive review of systems was negative.   PHYSICAL EXAMINATION: General appearance: alert, cooperative and no distress Head: Normocephalic, without obvious abnormality, atraumatic Neck: no adenopathy, no JVD, supple, symmetrical, trachea midline and thyroid not enlarged, symmetric, no tenderness/mass/nodules Lymph nodes: Cervical, supraclavicular, and axillary nodes normal. Resp: clear to auscultation bilaterally Back: negative Cardio: regular rate and rhythm, S1, S2 normal, no murmur, click, rub or gallop GI: soft, non-tender; bowel sounds normal; no masses,  no organomegaly Extremities: extremities normal, atraumatic, no cyanosis or edema  ECOG PERFORMANCE STATUS: 1 - Symptomatic but completely  ambulatory  Blood pressure (!) 145/54, pulse (!) 57, temperature 98.1 F (36.7 C), temperature source Oral, resp. rate 18, height 5\' 4"  (1.626 m), weight 141 lb 14.4 oz (64.4 kg), SpO2 99 %.  LABORATORY DATA: Lab Results  Component Value Date   WBC 5.7 11/08/2016   HGB 15.8 11/08/2016   HCT 46.8 (H) 11/08/2016   MCV 97.3 11/08/2016   PLT 220 11/08/2016      Chemistry      Component Value Date/Time   NA 141 11/08/2016 0919   K 3.7 11/08/2016 0919   CL 109 08/16/2014 0520   CO2 22 11/08/2016 0919   BUN 10.6 11/08/2016 0919   CREATININE 0.8 11/08/2016 0919      Component Value Date/Time   CALCIUM 9.5 11/08/2016 0919   ALKPHOS 88 11/08/2016 0919   AST 22 11/08/2016 0919   ALT 15 11/08/2016 0919   BILITOT 0.40 11/08/2016 0919       RADIOGRAPHIC STUDIES: No results  found.  ASSESSMENT AND PLAN:  This is a very pleasant 56 years old white female with recurrent non-small cell lung cancer, adenocarcinoma with positive PDL 1 expression of 60%. The patient is currently on treatment with immunotherapy with [place] every 3 weeks is status post 26 cycles. She is tolerating her treatment well with no significant adverse effects. I recommended for her to proceed with cycle #27 today as a scheduled. I will see her back for follow-up visit in 3 weeks for evaluation after repeating CT scan of the chest, abdomen and pelvis for restaging of her disease. The patient was advised to call immediately if she has any concerning symptoms in the interval. The patient voices understanding of current disease status and treatment options and is in agreement with the current care plan. All questions were answered. The patient knows to call the clinic with any problems, questions or concerns. We can certainly see the patient much sooner if necessary. I spent 10 minutes counseling the patient face to face. The total time spent in the appointment was 15 minutes.  Disclaimer: This note was dictated with  voice recognition software. Similar sounding words can inadvertently be transcribed and may not be corrected upon review.

## 2016-11-24 ENCOUNTER — Ambulatory Visit (HOSPITAL_COMMUNITY)
Admission: RE | Admit: 2016-11-24 | Discharge: 2016-11-24 | Disposition: A | Discharge status: Home / Self Care | Payer: BLUE CROSS/BLUE SHIELD | Source: Ambulatory Visit | Attending: Internal Medicine | Admitting: Internal Medicine

## 2016-11-24 ENCOUNTER — Ambulatory Visit (HOSPITAL_COMMUNITY)
Admission: RE | Admit: 2016-11-24 | Discharge: 2016-11-24 | Disposition: A | Discharge status: Home / Self Care | Payer: Medicaid Other | Source: Ambulatory Visit | Attending: Internal Medicine | Admitting: Internal Medicine

## 2016-11-24 DIAGNOSIS — J432 Centrilobular emphysema: Secondary | ICD-10-CM | POA: Insufficient documentation

## 2016-11-24 DIAGNOSIS — E039 Hypothyroidism, unspecified: Secondary | ICD-10-CM

## 2016-11-24 DIAGNOSIS — M5137 Other intervertebral disc degeneration, lumbosacral region: Secondary | ICD-10-CM | POA: Insufficient documentation

## 2016-11-24 DIAGNOSIS — C3432 Malignant neoplasm of lower lobe, left bronchus or lung: Secondary | ICD-10-CM | POA: Insufficient documentation

## 2016-11-24 DIAGNOSIS — I700 Atherosclerosis of aorta: Secondary | ICD-10-CM | POA: Insufficient documentation

## 2016-11-24 DIAGNOSIS — I252 Old myocardial infarction: Secondary | ICD-10-CM | POA: Insufficient documentation

## 2016-11-24 DIAGNOSIS — Z9049 Acquired absence of other specified parts of digestive tract: Secondary | ICD-10-CM | POA: Insufficient documentation

## 2016-11-24 DIAGNOSIS — C3412 Malignant neoplasm of upper lobe, left bronchus or lung: Secondary | ICD-10-CM

## 2016-11-24 DIAGNOSIS — Z5112 Encounter for antineoplastic immunotherapy: Secondary | ICD-10-CM | POA: Diagnosis not present

## 2016-11-24 DIAGNOSIS — I2510 Atherosclerotic heart disease of native coronary artery without angina pectoris: Secondary | ICD-10-CM | POA: Diagnosis not present

## 2016-11-24 DIAGNOSIS — I712 Thoracic aortic aneurysm, without rupture: Secondary | ICD-10-CM | POA: Diagnosis not present

## 2016-11-24 DIAGNOSIS — R918 Other nonspecific abnormal finding of lung field: Secondary | ICD-10-CM | POA: Diagnosis not present

## 2016-11-24 MED ORDER — IOPAMIDOL (ISOVUE-300) INJECTION 61%
100.0000 mL | Freq: Once | INTRAVENOUS | Status: AC | PRN
  Administered 2016-11-24: 100 mL via INTRAVENOUS

## 2016-11-24 MED ORDER — IOPAMIDOL (ISOVUE-300) INJECTION 61%
30.0000 mL | Freq: Once | INTRAVENOUS | Status: AC | PRN
  Administered 2016-11-24: 30 mL via ORAL

## 2016-11-24 MED ORDER — IOPAMIDOL (ISOVUE-300) INJECTION 61%
INTRAVENOUS | Status: AC
  Administered 2016-11-24: 30 mL via ORAL
  Filled 2016-11-24: qty 30

## 2016-11-24 MED ORDER — IOPAMIDOL (ISOVUE-370) INJECTION 76%
INTRAVENOUS | Status: AC
  Filled 2016-11-24: qty 100

## 2016-11-29 ENCOUNTER — Encounter: Payer: Self-pay | Admitting: Internal Medicine

## 2016-11-29 ENCOUNTER — Ambulatory Visit (HOSPITAL_BASED_OUTPATIENT_CLINIC_OR_DEPARTMENT_OTHER): Payer: BLUE CROSS/BLUE SHIELD | Admitting: Internal Medicine

## 2016-11-29 ENCOUNTER — Ambulatory Visit (HOSPITAL_BASED_OUTPATIENT_CLINIC_OR_DEPARTMENT_OTHER): Payer: BLUE CROSS/BLUE SHIELD

## 2016-11-29 ENCOUNTER — Other Ambulatory Visit (HOSPITAL_BASED_OUTPATIENT_CLINIC_OR_DEPARTMENT_OTHER): Payer: BLUE CROSS/BLUE SHIELD

## 2016-11-29 VITALS — BP 125/62 | HR 65 | Temp 98.6°F | Resp 17 | Ht 64.0 in | Wt 139.5 lb

## 2016-11-29 DIAGNOSIS — Z5112 Encounter for antineoplastic immunotherapy: Secondary | ICD-10-CM | POA: Diagnosis not present

## 2016-11-29 DIAGNOSIS — C3432 Malignant neoplasm of lower lobe, left bronchus or lung: Secondary | ICD-10-CM

## 2016-11-29 DIAGNOSIS — E039 Hypothyroidism, unspecified: Secondary | ICD-10-CM

## 2016-11-29 DIAGNOSIS — C3412 Malignant neoplasm of upper lobe, left bronchus or lung: Secondary | ICD-10-CM

## 2016-11-29 DIAGNOSIS — Z79899 Other long term (current) drug therapy: Secondary | ICD-10-CM

## 2016-11-29 DIAGNOSIS — R5382 Chronic fatigue, unspecified: Secondary | ICD-10-CM

## 2016-11-29 LAB — COMPREHENSIVE METABOLIC PANEL
ALK PHOS: 93 U/L (ref 40–150)
ALT: 18 U/L (ref 0–55)
ANION GAP: 8 meq/L (ref 3–11)
AST: 23 U/L (ref 5–34)
Albumin: 3.6 g/dL (ref 3.5–5.0)
BILIRUBIN TOTAL: 0.39 mg/dL (ref 0.20–1.20)
BUN: 11.8 mg/dL (ref 7.0–26.0)
CO2: 23 mEq/L (ref 22–29)
CREATININE: 0.7 mg/dL (ref 0.6–1.1)
Calcium: 9.6 mg/dL (ref 8.4–10.4)
Chloride: 108 mEq/L (ref 98–109)
Glucose: 97 mg/dl (ref 70–140)
Potassium: 4 mEq/L (ref 3.5–5.1)
Sodium: 139 mEq/L (ref 136–145)
TOTAL PROTEIN: 7.3 g/dL (ref 6.4–8.3)

## 2016-11-29 LAB — CBC WITH DIFFERENTIAL/PLATELET
BASO%: 1 % (ref 0.0–2.0)
Basophils Absolute: 0.1 10*3/uL (ref 0.0–0.1)
EOS ABS: 0.1 10*3/uL (ref 0.0–0.5)
EOS%: 2 % (ref 0.0–7.0)
HEMATOCRIT: 47.2 % — AB (ref 34.8–46.6)
HEMOGLOBIN: 16.1 g/dL — AB (ref 11.6–15.9)
LYMPH#: 1.9 10*3/uL (ref 0.9–3.3)
LYMPH%: 26.2 % (ref 14.0–49.7)
MCH: 32.9 pg (ref 25.1–34.0)
MCHC: 34.1 g/dL (ref 31.5–36.0)
MCV: 96.5 fL (ref 79.5–101.0)
MONO#: 0.6 10*3/uL (ref 0.1–0.9)
MONO%: 8.1 % (ref 0.0–14.0)
NEUT%: 62.7 % (ref 38.4–76.8)
NEUTROS ABS: 4.6 10*3/uL (ref 1.5–6.5)
PLATELETS: 193 10*3/uL (ref 145–400)
RBC: 4.89 10*6/uL (ref 3.70–5.45)
RDW: 14.6 % — AB (ref 11.2–14.5)
WBC: 7.4 10*3/uL (ref 3.9–10.3)

## 2016-11-29 LAB — TSH: TSH: 0.207 m(IU)/L — ABNORMAL LOW (ref 0.308–3.960)

## 2016-11-29 MED ORDER — SODIUM CHLORIDE 0.9 % IV SOLN
Freq: Once | INTRAVENOUS | Status: AC
  Administered 2016-11-29: 12:00:00 via INTRAVENOUS

## 2016-11-29 MED ORDER — SODIUM CHLORIDE 0.9 % IV SOLN
200.0000 mg | Freq: Once | INTRAVENOUS | Status: AC
  Administered 2016-11-29: 200 mg via INTRAVENOUS
  Filled 2016-11-29: qty 8

## 2016-11-29 NOTE — Patient Instructions (Signed)
[facility] Discharge Instructions for Patients Receiving Chemotherapy  Today you received the following chemotherapy agents Keytruda.  To help prevent nausea and vomiting after your treatment, we encourage you to take your nausea medication.   If you develop nausea and vomiting that is not controlled by your nausea medication, call the clinic.   BELOW ARE SYMPTOMS THAT SHOULD BE REPORTED IMMEDIATELY:  *FEVER GREATER THAN 100.5 F  *CHILLS WITH OR WITHOUT FEVER  NAUSEA AND VOMITING THAT IS NOT CONTROLLED WITH YOUR NAUSEA MEDICATION  *UNUSUAL SHORTNESS OF BREATH  *UNUSUAL BRUISING OR BLEEDING  TENDERNESS IN MOUTH AND THROAT WITH OR WITHOUT PRESENCE OF ULCERS  *URINARY PROBLEMS  *BOWEL PROBLEMS  UNUSUAL RASH Items with * indicate a potential emergency and should be followed up as soon as possible.  Feel free to call the clinic you have any questions or concerns. The clinic phone number is [phone].  Please show the [facility] at check-in to the Emergency Department and triage nurse.

## 2016-11-29 NOTE — Progress Notes (Signed)
[facility] Telephone:(336) [phone]   Fax:(336) [place], NP [place] [address]  DIAGNOSIS: Recurrent non-small cell lung cancer, adenocarcinoma with PDL 1 expression of 60% diagnosed in November 2016. The patient was initially diagnosed as stage IB (T2a, N0, M0) adenocarcinoma in March 2016.  PRIOR THERAPY:  Status post left video-assisted thoracoscopy, wedge resection left lower lobe, thoracoscopic left lower lobectomy and mediastinal lymph node dissection on 08/14/2014.   CURRENT THERAPY: Immunotherapy with Ketruda (pembrolizumab) 200 mg IV every 3 weeks status post 27 cycles. First dose was given 05/12/2015.   INTERVAL HISTORY: Sharon [name] 56 y.o. female returns to the clinic today for follow-up visit. The patient is feeling fine today with no specific complaints except for fatigue. She is tolerating her current treatment with immunotherapy fairly well. She denied having any chest pain, shortness of breath, cough or hemoptysis. She denied having any fever or chills. She has no nausea, vomiting, diarrhea or constipation. She has no significant weight loss or night sweats. She had repeat CT scan of the chest, abdomen and pelvis performed recently and she is here for evaluation and discussion of her scan results.  MEDICAL HISTORY: Past Medical History:  Diagnosis Date  . Aortic insufficiency    Echo 8/09:  EF 55-60, impaired relaxation, mild aortic stenosis (mean 8.1), moderate aortic insufficiency, trace MR, trace TR // Echo 4/18: mild LVH, EF 55-60, no RWMA, Gr 1 DD, mod AI   . Arthritis   . Carotid artery disease ([facility]) 03/09/2011   Carotid US 8/15 - R 40-59; L1-39 >> follow-up 1 year // Carotid US 4/18: ICA 1-39 bilaterally - repeat 1 yr  . COPD (chronic obstructive pulmonary disease) ([facility])   . Coronary artery disease involving native coronary artery of native heart without angina pectoris 11/24/2009   s/p  ant STEMI 5/09 >> LHC 5/09 - LM normal; LAD proximal 99; branching DX with one branch occluded and the other severely diseased; LCx okay with irregularities in the OM1; RCA proximal occluded-CTO; EF 30-40, anterior wall HK  >>  PCI: 3 x 18 mm Promus DES, 2.5 x 15 mm Promus DES to the LAD  . Emphysema of lung ([place])   . Headache 09/06/2016  . History of acute anterior wall MI 12/2007   s/p DES x 2 to LAD  . Hyperlipidemia   . Ischemic cardiomyopathy   . Non-small cell lung cancer ([place])    per CT CHEST/PET 2/4 and 07/18/14 @ [facility]  . Pneumonia    2015   HX BRONCHITIS    . Tobacco user     ALLERGIES:  is allergic to codeine; lipitor [atorvastatin calcium]; morphine and related; and oxycodone-acetaminophen.  MEDICATIONS:  Current Outpatient Prescriptions  Medication Sig Dispense Refill  . ALPRAZolam (XANAX) 0.25 MG tablet Take 0.25 mg by mouth as needed for anxiety or sleep.   0  . aspirin (ASPIR-LOW) 81 MG EC tablet Take 81 mg by mouth daily. On hold    . clopidogrel (PLAVIX) 75 MG tablet Take 1 tablet (75 mg total) by mouth daily. 90 tablet 3  . levothyroxine (SYNTHROID, LEVOTHROID) 125 MCG tablet Take 1 tablet (125 mcg total) by mouth daily before breakfast. 30 tablet 0  . Magnesium 250 MG TABS Take 250 mg by mouth daily.    . ondansetron (ZOFRAN) 8 MG tablet TAKE 1 TABLET (8 MG TOTAL) BY MOUTH EVERY 8 (EIGHT) HOURS AS NEEDED  FOR NAUSEA OR VOMITING. (Patient not taking: Reported on 10/18/2016) 30 tablet 0  . Pembrolizumab (KEYTRUDA IV) TAKE KEYTRUDA  IV INFUSION ONCE EVERY 3 WEEKS FOR CHEMO( PT. NOT SURE OF DOSAGE)    . potassium chloride (MICRO-K) 10 MEQ CR capsule Take 10 mEq by mouth 2 (two) times daily.  0  . rosuvastatin (CRESTOR) 40 MG tablet Take 1 tablet (40 mg total) by mouth daily. 90 tablet 3  . traMADol (ULTRAM) 50 MG tablet Take 50 mg by mouth every 6 (six) hours as needed for moderate pain or severe pain.     No current facility-administered medications for this visit.      SURGICAL HISTORY:  Past Surgical History:  Procedure Laterality Date  . CARDIAC CATHETERIZATION  10/15/2007   EF 30-40%  . CESAREAN SECTION    . CHOLECYSTECTOMY    . CORONARY ANGIOPLASTY WITH STENT PLACEMENT     LAD  . ectopic pregnancies requiring laparotomies    . LEAD REMOVAL Left 08/14/2014   Procedure: CRYO INTERCOSTAL NERVE BLOCK;  Surgeon: Melrose Nakayama, MD;  Location: [facility];  Service: Thoracic;  Laterality: Left;  . LOBECTOMY Left 08/14/2014   Procedure: LEFT LOWER LOBECTOMY;  Surgeon: Melrose Nakayama, MD;  Location: [facility];  Service: Thoracic;  Laterality: Left;  . NODE DISSECTION Left 08/14/2014   Procedure: NODE DISSECTION, LEFT LOWER LOBE LUNG;  Surgeon: Melrose Nakayama, MD;  Location: [facility];  Service: Thoracic;  Laterality: Left;  . SHOULDER SURGERY     LEFT X 2   (REMOVED SOME COLLAR BONE )  . TRANSTHORACIC ECHOCARDIOGRAM  10/17/2007  . TUBAL LIGATION    . US ECHOCARDIOGRAPHY  01/07/2008   EF 55-60%  . VIDEO ASSISTED THORACOSCOPY (VATS)/WEDGE RESECTION Left 08/14/2014   Procedure: VIDEO ASSISTED THORACOSCOPY (VATS)/WEDGE RESECTION;  Surgeon: Melrose Nakayama, MD;  Location: [facility];  Service: Thoracic;  Laterality: Left;    REVIEW OF SYSTEMS:  Constitutional: positive for fatigue Eyes: negative Ears, nose, mouth, throat, and face: negative Respiratory: negative Cardiovascular: negative Gastrointestinal: negative Genitourinary:negative Integument/breast: negative Hematologic/lymphatic: negative Musculoskeletal:negative Neurological: negative Behavioral/Psych: negative Endocrine: negative Allergic/Immunologic: negative   PHYSICAL EXAMINATION: General appearance: alert, cooperative, fatigued and no distress Head: Normocephalic, without obvious abnormality, atraumatic Neck: no adenopathy, no JVD, supple, symmetrical, trachea midline and thyroid not enlarged, symmetric, no tenderness/mass/nodules Lymph nodes: Cervical, supraclavicular, and  axillary nodes normal. Resp: clear to auscultation bilaterally Back: negative Cardio: regular rate and rhythm, S1, S2 normal, no murmur, click, rub or gallop GI: soft, non-tender; bowel sounds normal; no masses,  no organomegaly Extremities: extremities normal, atraumatic, no cyanosis or edema Neurologic: Alert and oriented X 3, normal strength and tone. Normal symmetric reflexes. Normal coordination and gait  ECOG PERFORMANCE STATUS: 1 - Symptomatic but completely ambulatory  Blood pressure 125/62, pulse 65, temperature 98.6 F (37 C), temperature source Oral, resp. rate 17, height 5\' 4"  (1.626 m), weight 139 lb 8 oz (63.3 kg), SpO2 98 %.  LABORATORY DATA: Lab Results  Component Value Date   WBC 7.4 11/29/2016   HGB 16.1 (H) 11/29/2016   HCT 47.2 (H) 11/29/2016   MCV 96.5 11/29/2016   PLT 193 11/29/2016      Chemistry      Component Value Date/Time   NA 141 11/08/2016 0919   K 3.7 11/08/2016 0919   CL 109 08/16/2014 0520   CO2 22 11/08/2016 0919   BUN 10.6 11/08/2016 0919   CREATININE 0.8 11/08/2016 0919      Component  Value Date/Time   CALCIUM 9.5 11/08/2016 0919   ALKPHOS 88 11/08/2016 0919   AST 22 11/08/2016 0919   ALT 15 11/08/2016 0919   BILITOT 0.40 11/08/2016 0919       RADIOGRAPHIC STUDIES: Ct Chest W Contrast  Result Date: 11/24/2016 CLINICAL DATA:  Restaging of left lung cancer, immunotherapy in progress. EXAM: CT CHEST, ABDOMEN, AND PELVIS WITH CONTRAST TECHNIQUE: Multidetector CT imaging of the chest, abdomen and pelvis was performed following the standard protocol during bolus administration of intravenous contrast. CONTRAST:  176mL ISOVUE-300 IOPAMIDOL (ISOVUE-300) INJECTION 61%, 74mL ISOVUE-300 IOPAMIDOL (ISOVUE-300) INJECTION 61% COMPARISON:  09/05/2016 FINDINGS: CT CHEST FINDINGS Cardiovascular: Coronary, aortic arch, and branch vessel atherosclerotic vascular disease. Fatty density along the left ventricular apex suggest prior myocardial infarction.  Calcification of the aortic valve. Small ascending aortic aneurysm at 4.0 cm (image 29/2). Mediastinum/Nodes: Right upper paratracheal nodal cluster 1.8 cm in short axis, previously 1.7 cm when measured in the same fashion. This is made up of several adjacent lymph nodes. AP window lymph node 1.1 cm in short axis on image 27/2, formerly 1.1 cm. Upper paratracheal lymph node adjacent to the thoracic inlet 0.7 cm in short axis on image 11/2, previously 0.8 cm. Lungs/Pleura: Centrilobular and paraseptal emphysema. Prior left lower lobe lobectomy, wedge resection in the left upper lobe anteriorly. Nodular scarring along the inferior aspect of the left upper lobe has a nodular component with thickness of 0.9 cm, previously the same. No new or suspicious pulmonary nodules. Mild scarring or atelectasis medially in the right middle lobe. Musculoskeletal: Broad Schmorl's node along the superior endplate of V78. Sclerosis along the superior endplate of I69. Overall stable from prior. No compelling findings of osseous metastatic disease. Mild anterior wedging at T9 is stable. CT ABDOMEN PELVIS FINDINGS Hepatobiliary: Cholecystectomy.  Otherwise unremarkable. Pancreas: Unremarkable Spleen: Unremarkable Adrenals/Urinary Tract: Small amount of gas in the urinary bladder, query recent catheterization. The kidneys and adrenal glands appear normal. Stomach/Bowel: Unremarkable Vascular/Lymphatic: Aortoiliac atherosclerotic vascular disease. Reproductive: Unremarkable Other: No supplemental non-categorized findings. Musculoskeletal: Degenerative disc disease at L5-S1. IMPRESSION: 1. Stable mild mediastinal adenopathy. This is nonspecific with respect to being reactive or neoplastic. 2. Aortic Atherosclerosis (ICD10-I70.0) and Emphysema (ICD10-J43.9). Coronary atherosclerosis noted and there is calcification of the aortic valve. 3. Small ascending aortic aneurysm at 4.0 cm diameter. Recommend annual imaging followup by CTA or MRA. This  recommendation follows 2010 ACCF/AHA/AATS/ACR/ASA/SCA/SCAI/SIR/STS/SVM Guidelines for the Diagnosis and Management of Patients with Thoracic Aortic Disease. Circulation. 2010; 121: G295-M841. 4. Chronic nodular scarring at the left lung base. Electronically Signed   By: Van Clines M.D.   On: 11/24/2016 11:25   Ct Abdomen Pelvis W Contrast  Result Date: 11/24/2016 CLINICAL DATA:  Restaging of left lung cancer, immunotherapy in progress. EXAM: CT CHEST, ABDOMEN, AND PELVIS WITH CONTRAST TECHNIQUE: Multidetector CT imaging of the chest, abdomen and pelvis was performed following the standard protocol during bolus administration of intravenous contrast. CONTRAST:  166mL ISOVUE-300 IOPAMIDOL (ISOVUE-300) INJECTION 61%, 61mL ISOVUE-300 IOPAMIDOL (ISOVUE-300) INJECTION 61% COMPARISON:  09/05/2016 FINDINGS: CT CHEST FINDINGS Cardiovascular: Coronary, aortic arch, and branch vessel atherosclerotic vascular disease. Fatty density along the left ventricular apex suggest prior myocardial infarction. Calcification of the aortic valve. Small ascending aortic aneurysm at 4.0 cm (image 29/2). Mediastinum/Nodes: Right upper paratracheal nodal cluster 1.8 cm in short axis, previously 1.7 cm when measured in the same fashion. This is made up of several adjacent lymph nodes. AP window lymph node 1.1 cm in short axis on image 27/2,  formerly 1.1 cm. Upper paratracheal lymph node adjacent to the thoracic inlet 0.7 cm in short axis on image 11/2, previously 0.8 cm. Lungs/Pleura: Centrilobular and paraseptal emphysema. Prior left lower lobe lobectomy, wedge resection in the left upper lobe anteriorly. Nodular scarring along the inferior aspect of the left upper lobe has a nodular component with thickness of 0.9 cm, previously the same. No new or suspicious pulmonary nodules. Mild scarring or atelectasis medially in the right middle lobe. Musculoskeletal: Broad Schmorl's node along the superior endplate of Z61. Sclerosis along  the superior endplate of W96. Overall stable from prior. No compelling findings of osseous metastatic disease. Mild anterior wedging at T9 is stable. CT ABDOMEN PELVIS FINDINGS Hepatobiliary: Cholecystectomy.  Otherwise unremarkable. Pancreas: Unremarkable Spleen: Unremarkable Adrenals/Urinary Tract: Small amount of gas in the urinary bladder, query recent catheterization. The kidneys and adrenal glands appear normal. Stomach/Bowel: Unremarkable Vascular/Lymphatic: Aortoiliac atherosclerotic vascular disease. Reproductive: Unremarkable Other: No supplemental non-categorized findings. Musculoskeletal: Degenerative disc disease at L5-S1. IMPRESSION: 1. Stable mild mediastinal adenopathy. This is nonspecific with respect to being reactive or neoplastic. 2. Aortic Atherosclerosis (ICD10-I70.0) and Emphysema (ICD10-J43.9). Coronary atherosclerosis noted and there is calcification of the aortic valve. 3. Small ascending aortic aneurysm at 4.0 cm diameter. Recommend annual imaging followup by CTA or MRA. This recommendation follows 2010 ACCF/AHA/AATS/ACR/ASA/SCA/SCAI/SIR/STS/SVM Guidelines for the Diagnosis and Management of Patients with Thoracic Aortic Disease. Circulation. 2010; 121: E454-U981. 4. Chronic nodular scarring at the left lung base. Electronically Signed   By: Van Clines M.D.   On: 11/24/2016 11:25    ASSESSMENT AND PLAN:  This is a very pleasant 56 years old white female with recurrent non-small cell lung cancer, adenocarcinoma with positive PDL 1 expression of 60%. The patient is currently on treatment with immunotherapy with Ketruda 200 mg IV every 3 weeks is status post 27 cycles. She has been tolerating her treatment well with no significant adverse effects. She had repeat CT scan of the chest, abdomen and pelvis performed recently. I personally and independently reviewed the scans and discuss the results with the patient today. Her scan showed no evidence for disease progression. I  recommended for the patient to continue her current treatment with [place] and she will proceed with cycle #28 today. For the hypothyroidism, she will continue her current treatment with levothyroxine. I also discussed with the patient the finding on her CT scan including aortic atherosclerosis and coronary atherosclerosis and she was advised to discuss with her primary care physician for adjustment of her medications. The patient would come back for follow-up visit in 3 weeks for evaluation before starting cycle #29. She was advised to call immediately if she has any concerning symptoms in the interval. The patient voices understanding of current disease status and treatment options and is in agreement with the current care plan. All questions were answered. The patient knows to call the clinic with any problems, questions or concerns. We can certainly see the patient much sooner if necessary.  Disclaimer: This note was dictated with voice recognition software. Similar sounding words can inadvertently be transcribed and may not be corrected upon review.

## 2016-12-01 ENCOUNTER — Telehealth: Payer: Self-pay | Admitting: Internal Medicine

## 2016-12-01 NOTE — Telephone Encounter (Signed)
Scheduled appt pe r7/3 los - patient to get an updated schedule next visit.

## 2016-12-20 ENCOUNTER — Other Ambulatory Visit (HOSPITAL_BASED_OUTPATIENT_CLINIC_OR_DEPARTMENT_OTHER): Payer: BLUE CROSS/BLUE SHIELD

## 2016-12-20 ENCOUNTER — Ambulatory Visit (HOSPITAL_BASED_OUTPATIENT_CLINIC_OR_DEPARTMENT_OTHER): Payer: BLUE CROSS/BLUE SHIELD | Admitting: Internal Medicine

## 2016-12-20 ENCOUNTER — Ambulatory Visit (HOSPITAL_BASED_OUTPATIENT_CLINIC_OR_DEPARTMENT_OTHER): Payer: BLUE CROSS/BLUE SHIELD

## 2016-12-20 ENCOUNTER — Encounter: Payer: Self-pay | Admitting: Internal Medicine

## 2016-12-20 VITALS — BP 131/63 | HR 73 | Temp 98.3°F | Resp 18 | Ht 64.0 in | Wt 141.6 lb

## 2016-12-20 DIAGNOSIS — Z5112 Encounter for antineoplastic immunotherapy: Secondary | ICD-10-CM

## 2016-12-20 DIAGNOSIS — C3412 Malignant neoplasm of upper lobe, left bronchus or lung: Secondary | ICD-10-CM

## 2016-12-20 DIAGNOSIS — C3432 Malignant neoplasm of lower lobe, left bronchus or lung: Secondary | ICD-10-CM

## 2016-12-20 DIAGNOSIS — Z79899 Other long term (current) drug therapy: Secondary | ICD-10-CM

## 2016-12-20 DIAGNOSIS — R5382 Chronic fatigue, unspecified: Secondary | ICD-10-CM

## 2016-12-20 DIAGNOSIS — E039 Hypothyroidism, unspecified: Secondary | ICD-10-CM

## 2016-12-20 LAB — COMPREHENSIVE METABOLIC PANEL
ALK PHOS: 102 U/L (ref 40–150)
ALT: 14 U/L (ref 0–55)
AST: 19 U/L (ref 5–34)
Albumin: 3.6 g/dL (ref 3.5–5.0)
Anion Gap: 11 mEq/L (ref 3–11)
BUN: 12.5 mg/dL (ref 7.0–26.0)
CALCIUM: 9.8 mg/dL (ref 8.4–10.4)
CHLORIDE: 107 meq/L (ref 98–109)
CO2: 23 mEq/L (ref 22–29)
Creatinine: 0.8 mg/dL (ref 0.6–1.1)
EGFR: 89 mL/min/{1.73_m2} — AB (ref >90)
GLUCOSE: 87 mg/dL (ref 70–140)
POTASSIUM: 3.7 meq/L (ref 3.5–5.1)
SODIUM: 142 meq/L (ref 136–145)
Total Bilirubin: 0.32 mg/dL (ref 0.20–1.20)
Total Protein: 7.4 g/dL (ref 6.4–8.3)

## 2016-12-20 LAB — CBC WITH DIFFERENTIAL/PLATELET
BASO%: 0.8 % (ref 0.0–2.0)
BASOS ABS: 0.1 10*3/uL (ref 0.0–0.1)
EOS ABS: 0.1 10*3/uL (ref 0.0–0.5)
EOS%: 1.5 % (ref 0.0–7.0)
HCT: 47.6 % — ABNORMAL HIGH (ref 34.8–46.6)
HGB: 16.3 g/dL — ABNORMAL HIGH (ref 11.6–15.9)
LYMPH%: 21.2 % (ref 14.0–49.7)
MCH: 32.9 pg (ref 25.1–34.0)
MCHC: 34.2 g/dL (ref 31.5–36.0)
MCV: 96.4 fL (ref 79.5–101.0)
MONO#: 0.6 10*3/uL (ref 0.1–0.9)
MONO%: 6.1 % (ref 0.0–14.0)
NEUT#: 7 10*3/uL — ABNORMAL HIGH (ref 1.5–6.5)
NEUT%: 70.4 % (ref 38.4–76.8)
Platelets: 209 10*3/uL (ref 145–400)
RBC: 4.94 10*6/uL (ref 3.70–5.45)
RDW: 14.7 % — ABNORMAL HIGH (ref 11.2–14.5)
WBC: 9.9 10*3/uL (ref 3.9–10.3)
lymph#: 2.1 10*3/uL (ref 0.9–3.3)

## 2016-12-20 LAB — TSH: TSH: 0.64 m[IU]/L (ref 0.308–3.960)

## 2016-12-20 MED ORDER — PEMBROLIZUMAB CHEMO INJECTION 100 MG/4ML
200.0000 mg | Freq: Once | INTRAVENOUS | Status: AC
  Administered 2016-12-20: 200 mg via INTRAVENOUS
  Filled 2016-12-20: qty 8

## 2016-12-20 MED ORDER — SODIUM CHLORIDE 0.9 % IV SOLN
Freq: Once | INTRAVENOUS | Status: AC
  Administered 2016-12-20: 11:00:00 via INTRAVENOUS

## 2016-12-20 NOTE — Progress Notes (Signed)
[facility] [phone]   Fax:(336) [place], NP [place] [address]  DIAGNOSIS: Recurrent non-small cell lung cancer, adenocarcinoma with PDL 1 expression of 60% diagnosed in November 2016. The patient was initially diagnosed as stage IB (T2a, N0, M0) adenocarcinoma in March 2016.  PRIOR THERAPY:  Status post left video-assisted thoracoscopy, wedge resection left lower lobe, thoracoscopic left lower lobectomy and mediastinal lymph node dissection on 08/14/2014.   CURRENT THERAPY: Immunotherapy with Ketruda (pembrolizumab) 200 mg IV every 3 weeks status post 28 cycles. First dose was given 05/12/2015.   INTERVAL HISTORY: Sharon [name] 56 y.o. female returns to the clinic today for follow-up visit accompanied by her daughter. The patient tolerated the last cycle of her treatment with [place] (pembrolizumab) fairly well. She denied having any chest pain, shortness of breath, cough or hemoptysis. She has no significant weight loss or night sweats. She has no fever or chills. She denied having any nausea, vomiting, diarrhea or constipation. She is here today for evaluation before starting cycle #29.   MEDICAL HISTORY: Past Medical History:  Diagnosis Date  . Aortic insufficiency    Echo 8/09:  EF 55-60, impaired relaxation, mild aortic stenosis (mean 8.1), moderate aortic insufficiency, trace MR, trace TR // Echo 4/18: mild LVH, EF 55-60, no RWMA, Gr 1 DD, mod AI   . Arthritis   . Carotid artery disease ([facility]) 03/09/2011   Carotid US 8/15 - R 40-59; L1-39 >> follow-up 1 year // Carotid US 4/18: ICA 1-39 bilaterally - repeat 1 yr  . COPD (chronic obstructive pulmonary disease) ([facility])   . Coronary artery disease involving native coronary artery of native heart without angina pectoris 11/24/2009   s/p ant STEMI 5/09 >> LHC 5/09 - LM normal; LAD proximal 99; branching DX with one branch occluded and the other  severely diseased; LCx okay with irregularities in the OM1; RCA proximal occluded-CTO; EF 30-40, anterior wall HK  >>  PCI: 3 x 18 mm Promus DES, 2.5 x 15 mm Promus DES to the LAD  . Emphysema of lung ([facility])   . Headache 09/06/2016  . History of acute anterior wall MI 12/2007   s/p DES x 2 to LAD  . Hyperlipidemia   . Ischemic cardiomyopathy   . Non-small cell lung cancer ([facility])    per CT CHEST/PET 2/4 and 07/18/14 @ [facility]  . Pneumonia    2015   HX BRONCHITIS    . Tobacco user     ALLERGIES:  is allergic to codeine; lipitor [atorvastatin calcium]; morphine and related; and oxycodone-acetaminophen.  MEDICATIONS:  Current Outpatient Prescriptions  Medication Sig Dispense Refill  . aspirin (ASPIR-LOW) 81 MG EC tablet Take 81 mg by mouth daily. On hold    . clopidogrel (PLAVIX) 75 MG tablet Take 1 tablet (75 mg total) by mouth daily. 90 tablet 3  . levothyroxine (SYNTHROID, LEVOTHROID) 125 MCG tablet Take 1 tablet (125 mcg total) by mouth daily before breakfast. 30 tablet 0  . Magnesium 250 MG TABS Take 250 mg by mouth daily.    . ondansetron (ZOFRAN) 8 MG tablet TAKE 1 TABLET (8 MG TOTAL) BY MOUTH EVERY 8 (EIGHT) HOURS AS NEEDED FOR NAUSEA OR VOMITING. 30 tablet 0  . Pembrolizumab (KEYTRUDA IV) TAKE KEYTRUDA  IV INFUSION ONCE EVERY 3 WEEKS FOR CHEMO( PT. NOT SURE OF DOSAGE)    . potassium chloride (MICRO-K) 10 MEQ CR capsule Take  10 mEq by mouth 2 (two) times daily.  0  . rosuvastatin (CRESTOR) 40 MG tablet Take 1 tablet (40 mg total) by mouth daily. 90 tablet 3  . traMADol (ULTRAM) 50 MG tablet Take 50 mg by mouth every 6 (six) hours as needed for moderate pain or severe pain.     No current facility-administered medications for this visit.     SURGICAL HISTORY:  Past Surgical History:  Procedure Laterality Date  . CARDIAC CATHETERIZATION  10/15/2007   EF 30-40%  . CESAREAN SECTION    . CHOLECYSTECTOMY    . CORONARY ANGIOPLASTY WITH STENT PLACEMENT     LAD  . ectopic pregnancies  requiring laparotomies    . LEAD REMOVAL Left 08/14/2014   Procedure: CRYO INTERCOSTAL NERVE BLOCK;  Surgeon: Melrose Nakayama, MD;  Location: [facility];  Service: Thoracic;  Laterality: Left;  . LOBECTOMY Left 08/14/2014   Procedure: LEFT LOWER LOBECTOMY;  Surgeon: Melrose Nakayama, MD;  Location: [facility];  Service: Thoracic;  Laterality: Left;  . NODE DISSECTION Left 08/14/2014   Procedure: NODE DISSECTION, LEFT LOWER LOBE LUNG;  Surgeon: Melrose Nakayama, MD;  Location: [facility];  Service: Thoracic;  Laterality: Left;  . SHOULDER SURGERY     LEFT X 2   (REMOVED SOME COLLAR BONE )  . TRANSTHORACIC ECHOCARDIOGRAM  10/17/2007  . TUBAL LIGATION    . US ECHOCARDIOGRAPHY  01/07/2008   EF 55-60%  . VIDEO ASSISTED THORACOSCOPY (VATS)/WEDGE RESECTION Left 08/14/2014   Procedure: VIDEO ASSISTED THORACOSCOPY (VATS)/WEDGE RESECTION;  Surgeon: Melrose Nakayama, MD;  Location: [facility];  Service: Thoracic;  Laterality: Left;    REVIEW OF SYSTEMS:  A comprehensive review of systems was negative.   PHYSICAL EXAMINATION: General appearance: alert, cooperative and no distress Head: Normocephalic, without obvious abnormality, atraumatic Neck: no adenopathy, no JVD, supple, symmetrical, trachea midline and thyroid not enlarged, symmetric, no tenderness/mass/nodules Lymph nodes: Cervical, supraclavicular, and axillary nodes normal. Resp: clear to auscultation bilaterally Back: negative Cardio: regular rate and rhythm, S1, S2 normal, no murmur, click, rub or gallop GI: soft, non-tender; bowel sounds normal; no masses,  no organomegaly Extremities: extremities normal, atraumatic, no cyanosis or edema  ECOG PERFORMANCE STATUS: 1 - Symptomatic but completely ambulatory  Blood pressure 131/63, pulse 73, temperature 98.3 F (36.8 C), temperature source Oral, resp. rate 18, height 5\' 4"  (1.626 m), weight 141 lb 9.6 oz (64.2 kg), SpO2 92 %.  LABORATORY DATA: Lab Results  Component Value Date   WBC 9.9  12/20/2016   HGB 16.3 (H) 12/20/2016   HCT 47.6 (H) 12/20/2016   MCV 96.4 12/20/2016   PLT 209 12/20/2016      Chemistry      Component Value Date/Time   NA 139 11/29/2016 1026   K 4.0 11/29/2016 1026   CL 109 08/16/2014 0520   CO2 23 11/29/2016 1026   BUN 11.8 11/29/2016 1026   CREATININE 0.7 11/29/2016 1026      Component Value Date/Time   CALCIUM 9.6 11/29/2016 1026   ALKPHOS 93 11/29/2016 1026   AST 23 11/29/2016 1026   ALT 18 11/29/2016 1026   BILITOT 0.39 11/29/2016 1026       RADIOGRAPHIC STUDIES: Ct Chest W Contrast  Result Date: 11/24/2016 CLINICAL DATA:  Restaging of left lung cancer, immunotherapy in progress. EXAM: CT CHEST, ABDOMEN, AND PELVIS WITH CONTRAST TECHNIQUE: Multidetector CT imaging of the chest, abdomen and pelvis was performed following the standard protocol during bolus administration of intravenous contrast. CONTRAST:  151mL ISOVUE-300 IOPAMIDOL (ISOVUE-300) INJECTION 61%, 84mL ISOVUE-300 IOPAMIDOL (ISOVUE-300) INJECTION 61% COMPARISON:  09/05/2016 FINDINGS: CT CHEST FINDINGS Cardiovascular: Coronary, aortic arch, and branch vessel atherosclerotic vascular disease. Fatty density along the left ventricular apex suggest prior myocardial infarction. Calcification of the aortic valve. Small ascending aortic aneurysm at 4.0 cm (image 29/2). Mediastinum/Nodes: Right upper paratracheal nodal cluster 1.8 cm in short axis, previously 1.7 cm when measured in the same fashion. This is made up of several adjacent lymph nodes. AP window lymph node 1.1 cm in short axis on image 27/2, formerly 1.1 cm. Upper paratracheal lymph node adjacent to the thoracic inlet 0.7 cm in short axis on image 11/2, previously 0.8 cm. Lungs/Pleura: Centrilobular and paraseptal emphysema. Prior left lower lobe lobectomy, wedge resection in the left upper lobe anteriorly. Nodular scarring along the inferior aspect of the left upper lobe has a nodular component with thickness of 0.9 cm, previously  the same. No new or suspicious pulmonary nodules. Mild scarring or atelectasis medially in the right middle lobe. Musculoskeletal: Broad Schmorl's node along the superior endplate of L89. Sclerosis along the superior endplate of H73. Overall stable from prior. No compelling findings of osseous metastatic disease. Mild anterior wedging at T9 is stable. CT ABDOMEN PELVIS FINDINGS Hepatobiliary: Cholecystectomy.  Otherwise unremarkable. Pancreas: Unremarkable Spleen: Unremarkable Adrenals/Urinary Tract: Small amount of gas in the urinary bladder, query recent catheterization. The kidneys and adrenal glands appear normal. Stomach/Bowel: Unremarkable Vascular/Lymphatic: Aortoiliac atherosclerotic vascular disease. Reproductive: Unremarkable Other: No supplemental non-categorized findings. Musculoskeletal: Degenerative disc disease at L5-S1. IMPRESSION: 1. Stable mild mediastinal adenopathy. This is nonspecific with respect to being reactive or neoplastic. 2. Aortic Atherosclerosis (ICD10-I70.0) and Emphysema (ICD10-J43.9). Coronary atherosclerosis noted and there is calcification of the aortic valve. 3. Small ascending aortic aneurysm at 4.0 cm diameter. Recommend annual imaging followup by CTA or MRA. This recommendation follows 2010 ACCF/AHA/AATS/ACR/ASA/SCA/SCAI/SIR/STS/SVM Guidelines for the Diagnosis and Management of Patients with Thoracic Aortic Disease. Circulation. 2010; 121: S287-G811. 4. Chronic nodular scarring at the left lung base. Electronically Signed   By: Van Clines M.D.   On: 11/24/2016 11:25   Ct Abdomen Pelvis W Contrast  Result Date: 11/24/2016 CLINICAL DATA:  Restaging of left lung cancer, immunotherapy in progress. EXAM: CT CHEST, ABDOMEN, AND PELVIS WITH CONTRAST TECHNIQUE: Multidetector CT imaging of the chest, abdomen and pelvis was performed following the standard protocol during bolus administration of intravenous contrast. CONTRAST:  135mL ISOVUE-300 IOPAMIDOL (ISOVUE-300)  INJECTION 61%, 63mL ISOVUE-300 IOPAMIDOL (ISOVUE-300) INJECTION 61% COMPARISON:  09/05/2016 FINDINGS: CT CHEST FINDINGS Cardiovascular: Coronary, aortic arch, and branch vessel atherosclerotic vascular disease. Fatty density along the left ventricular apex suggest prior myocardial infarction. Calcification of the aortic valve. Small ascending aortic aneurysm at 4.0 cm (image 29/2). Mediastinum/Nodes: Right upper paratracheal nodal cluster 1.8 cm in short axis, previously 1.7 cm when measured in the same fashion. This is made up of several adjacent lymph nodes. AP window lymph node 1.1 cm in short axis on image 27/2, formerly 1.1 cm. Upper paratracheal lymph node adjacent to the thoracic inlet 0.7 cm in short axis on image 11/2, previously 0.8 cm. Lungs/Pleura: Centrilobular and paraseptal emphysema. Prior left lower lobe lobectomy, wedge resection in the left upper lobe anteriorly. Nodular scarring along the inferior aspect of the left upper lobe has a nodular component with thickness of 0.9 cm, previously the same. No new or suspicious pulmonary nodules. Mild scarring or atelectasis medially in the right middle lobe. Musculoskeletal: Broad Schmorl's node along the superior endplate of X72. Sclerosis  along the superior endplate of Z02. Overall stable from prior. No compelling findings of osseous metastatic disease. Mild anterior wedging at T9 is stable. CT ABDOMEN PELVIS FINDINGS Hepatobiliary: Cholecystectomy.  Otherwise unremarkable. Pancreas: Unremarkable Spleen: Unremarkable Adrenals/Urinary Tract: Small amount of gas in the urinary bladder, query recent catheterization. The kidneys and adrenal glands appear normal. Stomach/Bowel: Unremarkable Vascular/Lymphatic: Aortoiliac atherosclerotic vascular disease. Reproductive: Unremarkable Other: No supplemental non-categorized findings. Musculoskeletal: Degenerative disc disease at L5-S1. IMPRESSION: 1. Stable mild mediastinal adenopathy. This is nonspecific with  respect to being reactive or neoplastic. 2. Aortic Atherosclerosis (ICD10-I70.0) and Emphysema (ICD10-J43.9). Coronary atherosclerosis noted and there is calcification of the aortic valve. 3. Small ascending aortic aneurysm at 4.0 cm diameter. Recommend annual imaging followup by CTA or MRA. This recommendation follows 2010 ACCF/AHA/AATS/ACR/ASA/SCA/SCAI/SIR/STS/SVM Guidelines for the Diagnosis and Management of Patients with Thoracic Aortic Disease. Circulation. 2010; 121: H852-D782. 4. Chronic nodular scarring at the left lung base. Electronically Signed   By: Van Clines M.D.   On: 11/24/2016 11:25    ASSESSMENT AND PLAN:  This is a very pleasant 56 years old white female with recurrent non-small cell lung cancer, adenocarcinoma with positive PDL 1 expression of 60%. The patient is currently on treatment with immunotherapy with Ketruda 200 mg IV every 3 weeks status post 28 cycles. The patient is feeling fine today. I recommended for her to proceed with cycle #29 today as scheduled. I will see her back for follow-up visit in 3 weeks with the next cycle of her treatment. She was advised to call immediately if she has any concerning symptoms in the interval. The patient voices understanding of current disease status and treatment options and is in agreement with the current care plan. All questions were answered. The patient knows to call the clinic with any problems, questions or concerns. We can certainly see the patient much sooner if necessary.  Disclaimer: This note was dictated with voice recognition software. Similar sounding words can inadvertently be transcribed and may not be corrected upon review.

## 2016-12-20 NOTE — Patient Instructions (Signed)
[facility] Discharge Instructions for Patients Receiving Chemotherapy  Today you received the following chemotherapy agents Keytruda  To help prevent nausea and vomiting after your treatment, we encourage you to take your nausea medication    If you develop nausea and vomiting that is not controlled by your nausea medication, call the clinic.   BELOW ARE SYMPTOMS THAT SHOULD BE REPORTED IMMEDIATELY:  *FEVER GREATER THAN 100.5 F  *CHILLS WITH OR WITHOUT FEVER  NAUSEA AND VOMITING THAT IS NOT CONTROLLED WITH YOUR NAUSEA MEDICATION  *UNUSUAL SHORTNESS OF BREATH  *UNUSUAL BRUISING OR BLEEDING  TENDERNESS IN MOUTH AND THROAT WITH OR WITHOUT PRESENCE OF ULCERS  *URINARY PROBLEMS  *BOWEL PROBLEMS  UNUSUAL RASH Items with * indicate a potential emergency and should be followed up as soon as possible.  Feel free to call the clinic you have any questions or concerns. The clinic phone number is [phone].  Please show the [facility] at check-in to the Emergency Department and triage nurse.

## 2017-01-03 ENCOUNTER — Other Ambulatory Visit: Payer: Self-pay | Admitting: Physician Assistant

## 2017-01-09 ENCOUNTER — Other Ambulatory Visit: Payer: BLUE CROSS/BLUE SHIELD | Admitting: *Deleted

## 2017-01-09 DIAGNOSIS — E785 Hyperlipidemia, unspecified: Secondary | ICD-10-CM

## 2017-01-09 LAB — HEPATIC FUNCTION PANEL
ALT: 14 IU/L (ref 0–32)
AST: 18 IU/L (ref 0–40)
Albumin: 4.1 g/dL (ref 3.5–5.5)
Alkaline Phosphatase: 95 IU/L (ref 39–117)
Bilirubin Total: 0.4 mg/dL (ref 0.0–1.2)
Bilirubin, Direct: 0.14 mg/dL (ref 0.00–0.40)
Total Protein: 6.8 g/dL (ref 6.0–8.5)

## 2017-01-09 LAB — LIPID PANEL
CHOL/HDL RATIO: 3.1 ratio (ref 0.0–4.4)
Cholesterol, Total: 163 mg/dL (ref 100–199)
HDL: 52 mg/dL (ref >39)
LDL CALC: 97 mg/dL (ref 0–99)
TRIGLYCERIDES: 72 mg/dL (ref 0–149)
VLDL Cholesterol Cal: 14 mg/dL (ref 5–40)

## 2017-01-10 ENCOUNTER — Telehealth: Payer: Self-pay | Admitting: *Deleted

## 2017-01-10 ENCOUNTER — Ambulatory Visit (HOSPITAL_BASED_OUTPATIENT_CLINIC_OR_DEPARTMENT_OTHER): Payer: BLUE CROSS/BLUE SHIELD

## 2017-01-10 ENCOUNTER — Telehealth: Payer: Self-pay | Admitting: Internal Medicine

## 2017-01-10 ENCOUNTER — Ambulatory Visit (HOSPITAL_BASED_OUTPATIENT_CLINIC_OR_DEPARTMENT_OTHER): Payer: BLUE CROSS/BLUE SHIELD | Admitting: Internal Medicine

## 2017-01-10 ENCOUNTER — Other Ambulatory Visit (HOSPITAL_BASED_OUTPATIENT_CLINIC_OR_DEPARTMENT_OTHER): Payer: BLUE CROSS/BLUE SHIELD

## 2017-01-10 ENCOUNTER — Encounter: Payer: Self-pay | Admitting: Internal Medicine

## 2017-01-10 VITALS — BP 129/80 | HR 61 | Temp 98.1°F | Resp 18 | Ht 64.0 in | Wt 141.1 lb

## 2017-01-10 DIAGNOSIS — R5382 Chronic fatigue, unspecified: Secondary | ICD-10-CM

## 2017-01-10 DIAGNOSIS — Z5112 Encounter for antineoplastic immunotherapy: Secondary | ICD-10-CM

## 2017-01-10 DIAGNOSIS — C3412 Malignant neoplasm of upper lobe, left bronchus or lung: Secondary | ICD-10-CM

## 2017-01-10 DIAGNOSIS — C3432 Malignant neoplasm of lower lobe, left bronchus or lung: Secondary | ICD-10-CM

## 2017-01-10 DIAGNOSIS — E039 Hypothyroidism, unspecified: Secondary | ICD-10-CM | POA: Diagnosis not present

## 2017-01-10 LAB — COMPREHENSIVE METABOLIC PANEL
ALT: 19 U/L (ref 0–55)
AST: 23 U/L (ref 5–34)
Albumin: 3.4 g/dL — ABNORMAL LOW (ref 3.5–5.0)
Alkaline Phosphatase: 93 U/L (ref 40–150)
Anion Gap: 9 mEq/L (ref 3–11)
BUN: 12.2 mg/dL (ref 7.0–26.0)
CO2: 22 meq/L (ref 22–29)
Calcium: 9.8 mg/dL (ref 8.4–10.4)
Chloride: 109 mEq/L (ref 98–109)
Creatinine: 0.7 mg/dL (ref 0.6–1.1)
GLUCOSE: 88 mg/dL (ref 70–140)
POTASSIUM: 4.2 meq/L (ref 3.5–5.1)
SODIUM: 140 meq/L (ref 136–145)
Total Bilirubin: 0.59 mg/dL (ref 0.20–1.20)
Total Protein: 7.2 g/dL (ref 6.4–8.3)

## 2017-01-10 LAB — CBC WITH DIFFERENTIAL/PLATELET
BASO%: 0.4 % (ref 0.0–2.0)
BASOS ABS: 0 10*3/uL (ref 0.0–0.1)
EOS%: 1.8 % (ref 0.0–7.0)
Eosinophils Absolute: 0.1 10*3/uL (ref 0.0–0.5)
HCT: 45.1 % (ref 34.8–46.6)
HGB: 15.4 g/dL (ref 11.6–15.9)
LYMPH%: 25.7 % (ref 14.0–49.7)
MCH: 33.1 pg (ref 25.1–34.0)
MCHC: 34.1 g/dL (ref 31.5–36.0)
MCV: 97 fL (ref 79.5–101.0)
MONO#: 0.7 10*3/uL (ref 0.1–0.9)
MONO%: 8.4 % (ref 0.0–14.0)
NEUT#: 5 10*3/uL (ref 1.5–6.5)
NEUT%: 63.7 % (ref 38.4–76.8)
NRBC: 0 % (ref 0–0)
Platelets: 203 10*3/uL (ref 145–400)
RBC: 4.65 10*6/uL (ref 3.70–5.45)
RDW: 14.5 % (ref 11.2–14.5)
WBC: 7.9 10*3/uL (ref 3.9–10.3)
lymph#: 2 10*3/uL (ref 0.9–3.3)

## 2017-01-10 LAB — TSH: TSH: 0.083 m[IU]/L — AB (ref 0.308–3.960)

## 2017-01-10 MED ORDER — PEMBROLIZUMAB CHEMO INJECTION 100 MG/4ML
200.0000 mg | Freq: Once | INTRAVENOUS | Status: AC
  Administered 2017-01-10: 200 mg via INTRAVENOUS
  Filled 2017-01-10: qty 8

## 2017-01-10 MED ORDER — SODIUM CHLORIDE 0.9 % IV SOLN
Freq: Once | INTRAVENOUS | Status: AC
  Administered 2017-01-10: 12:00:00 via INTRAVENOUS

## 2017-01-10 NOTE — Progress Notes (Signed)
[facility] Telephone:([phone]   Fax:(336) [place], NP [place] [address]  DIAGNOSIS: Recurrent non-small cell lung cancer, adenocarcinoma with PDL 1 expression of 60% diagnosed in November 2016. The patient was initially diagnosed as stage IB (T2a, N0, M0) adenocarcinoma in March 2016.  PRIOR THERAPY:  Status post left video-assisted thoracoscopy, wedge resection left lower lobe, thoracoscopic left lower lobectomy and mediastinal lymph node dissection on 08/14/2014.   CURRENT THERAPY: Immunotherapy with Ketruda (pembrolizumab) 200 mg IV every 3 weeks status post 29 cycles. First dose was given 05/12/2015.   INTERVAL HISTORY: Sharon [name] 56 y.o. female returns to the clinic today for follow-up visit accompanied by her daughter. The patient is feeling fine today was no specific complaints. She continues to tolerate her treatment with immunotherapy fairly well. She denied having any nausea, vomiting, diarrhea or constipation. She denied having any fever or chills. She has no chest pain, shortness of breath, cough or hemoptysis. She had some allergy from exposure to dog hair from her neighbor. She is using Benadryl for this. She is here today for evaluation before starting cycle #30.   MEDICAL HISTORY: Past Medical History:  Diagnosis Date  . Aortic insufficiency    Echo 8/09:  EF 55-60, impaired relaxation, mild aortic stenosis (mean 8.1), moderate aortic insufficiency, trace MR, trace TR // Echo 4/18: mild LVH, EF 55-60, no RWMA, Gr 1 DD, mod AI   . Arthritis   . Carotid artery disease ([facility]) 03/09/2011   Carotid US 8/15 - R 40-59; L1-39 >> follow-up 1 year // Carotid US 4/18: ICA 1-39 bilaterally - repeat 1 yr  . COPD (chronic obstructive pulmonary disease) ([facility])   . Coronary artery disease involving native coronary artery of native heart without angina pectoris 11/24/2009   s/p ant STEMI 5/09 >> LHC 5/09 -  LM normal; LAD proximal 99; branching DX with one branch occluded and the other severely diseased; LCx okay with irregularities in the OM1; RCA proximal occluded-CTO; EF 30-40, anterior wall HK  >>  PCI: 3 x 18 mm Promus DES, 2.5 x 15 mm Promus DES to the LAD  . Emphysema of lung ([facility])   . Headache 09/06/2016  . History of acute anterior wall MI 12/2007   s/p DES x 2 to LAD  . Hyperlipidemia   . Ischemic cardiomyopathy   . Non-small cell lung cancer ([facility])    per CT CHEST/PET 2/4 and 07/18/14 @ [facility]  . Pneumonia    2015   HX BRONCHITIS    . Tobacco user     ALLERGIES:  is allergic to codeine; lipitor [atorvastatin calcium]; morphine and related; and oxycodone-acetaminophen.  MEDICATIONS:  Current Outpatient Prescriptions  Medication Sig Dispense Refill  . aspirin (ASPIR-LOW) 81 MG EC tablet Take 81 mg by mouth daily. On hold    . clopidogrel (PLAVIX) 75 MG tablet TAKE 1 TABLET BY MOUTH EVERY DAY 90 tablet 2  . levothyroxine (SYNTHROID, LEVOTHROID) 125 MCG tablet Take 1 tablet (125 mcg total) by mouth daily before breakfast. 30 tablet 0  . Magnesium 250 MG TABS Take 250 mg by mouth daily.    . ondansetron (ZOFRAN) 8 MG tablet TAKE 1 TABLET (8 MG TOTAL) BY MOUTH EVERY 8 (EIGHT) HOURS AS NEEDED FOR NAUSEA OR VOMITING. 30 tablet 0  . Pembrolizumab (KEYTRUDA IV) TAKE KEYTRUDA  IV INFUSION ONCE EVERY 3 WEEKS FOR CHEMO( PT. NOT SURE OF  DOSAGE)    . potassium chloride (MICRO-K) 10 MEQ CR capsule Take 10 mEq by mouth 2 (two) times daily.  0  . traMADol (ULTRAM) 50 MG tablet Take 50 mg by mouth every 6 (six) hours as needed for moderate pain or severe pain.    . rosuvastatin (CRESTOR) 40 MG tablet Take 1 tablet (40 mg total) by mouth daily. 90 tablet 3   No current facility-administered medications for this visit.     SURGICAL HISTORY:  Past Surgical History:  Procedure Laterality Date  . CARDIAC CATHETERIZATION  10/15/2007   EF 30-40%  . CESAREAN SECTION    . CHOLECYSTECTOMY    .  CORONARY ANGIOPLASTY WITH STENT PLACEMENT     LAD  . ectopic pregnancies requiring laparotomies    . LEAD REMOVAL Left 08/14/2014   Procedure: CRYO INTERCOSTAL NERVE BLOCK;  Surgeon: Melrose Nakayama, MD;  Location: [facility];  Service: Thoracic;  Laterality: Left;  . LOBECTOMY Left 08/14/2014   Procedure: LEFT LOWER LOBECTOMY;  Surgeon: Melrose Nakayama, MD;  Location: [facility];  Service: Thoracic;  Laterality: Left;  . NODE DISSECTION Left 08/14/2014   Procedure: NODE DISSECTION, LEFT LOWER LOBE LUNG;  Surgeon: Melrose Nakayama, MD;  Location: [facility];  Service: Thoracic;  Laterality: Left;  . SHOULDER SURGERY     LEFT X 2   (REMOVED SOME COLLAR BONE )  . TRANSTHORACIC ECHOCARDIOGRAM  10/17/2007  . TUBAL LIGATION    . US ECHOCARDIOGRAPHY  01/07/2008   EF 55-60%  . VIDEO ASSISTED THORACOSCOPY (VATS)/WEDGE RESECTION Left 08/14/2014   Procedure: VIDEO ASSISTED THORACOSCOPY (VATS)/WEDGE RESECTION;  Surgeon: Melrose Nakayama, MD;  Location: [facility];  Service: Thoracic;  Laterality: Left;    REVIEW OF SYSTEMS:  A comprehensive review of systems was negative.   PHYSICAL EXAMINATION: General appearance: alert, cooperative and no distress Head: Normocephalic, without obvious abnormality, atraumatic Neck: no adenopathy, no JVD, supple, symmetrical, trachea midline and thyroid not enlarged, symmetric, no tenderness/mass/nodules Lymph nodes: Cervical, supraclavicular, and axillary nodes normal. Resp: clear to auscultation bilaterally Back: negative Cardio: regular rate and rhythm, S1, S2 normal, no murmur, click, rub or gallop GI: soft, non-tender; bowel sounds normal; no masses,  no organomegaly Extremities: extremities normal, atraumatic, no cyanosis or edema  ECOG PERFORMANCE STATUS: 1 - Symptomatic but completely ambulatory  Blood pressure 129/80, pulse 61, temperature 98.1 F (36.7 C), temperature source Oral, resp. rate 18, height 5\' 4"  (1.626 m), weight 141 lb 1.6 oz (64 kg), SpO2 97  %.  LABORATORY DATA: Lab Results  Component Value Date   WBC 7.9 01/10/2017   HGB 15.4 01/10/2017   HCT 45.1 01/10/2017   MCV 97.0 01/10/2017   PLT 203 01/10/2017      Chemistry      Component Value Date/Time   NA 140 01/10/2017 1029   K 4.2 01/10/2017 1029   CL 109 08/16/2014 0520   CO2 22 01/10/2017 1029   BUN 12.2 01/10/2017 1029   CREATININE 0.7 01/10/2017 1029      Component Value Date/Time   CALCIUM 9.8 01/10/2017 1029   ALKPHOS 93 01/10/2017 1029   AST 23 01/10/2017 1029   ALT 19 01/10/2017 1029   BILITOT 0.59 01/10/2017 1029       RADIOGRAPHIC STUDIES: No results found.  ASSESSMENT AND PLAN:  This is a very pleasant 56 years old white female with recurrent non-small cell lung cancer, adenocarcinoma with positive PDL 1 expression of 60%. The patient is currently on treatment with  immunotherapy with Ketruda 200 mg IV every 3 weeks status post 29 cycles. The patient continues to do well and tolerating the treatment with no significant adverse effects. I recommended for her to proceed with cycle #30 today as a scheduled. I will see her back for follow-up visit in 3 weeks for evaluation before starting cycle #31 after repeating CT scan of the chest, abdomen and pelvis for restaging of her disease. For the history of hypothyroidism, she will continue on her current treatment with levothyroxine. The patient was advised to call immediately if she has any concerning symptoms in the interval. The patient voices understanding of current disease status and treatment options and is in agreement with the current care plan. All questions were answered. The patient knows to call the clinic with any problems, questions or concerns. We can certainly see the patient much sooner if necessary.  Disclaimer: This note was dictated with voice recognition software. Similar sounding words can inadvertently be transcribed and may not be corrected upon review.

## 2017-01-10 NOTE — Telephone Encounter (Signed)
Scheduled appt per 8/14 los - patient to get new scheduled next visit - central radiology to contact patient with ct schedule.

## 2017-01-10 NOTE — Telephone Encounter (Signed)
-----   Message from Liliane Shi, [place] sent at 01/10/2017  3:17 PM EDT ----- Please call the patient LDL is better.  Compared with LDL in 2015, she has had a 50% reduction since starting Crestor.  Therefore, her lipids are well controlled.  LFTs are ok. Continue with current treatment plan. Richardson Dopp, PA-C   01/10/2017 3:16 PM

## 2017-01-10 NOTE — Telephone Encounter (Signed)
Lmtcb to go over lab results. I lmom on her cell # [phone] (M). When I tried the Home # [phone] (H) recording came on and said to check # call could not go through.

## 2017-01-10 NOTE — Patient Instructions (Signed)
[facility] Discharge Instructions for Patients Receiving Chemotherapy  Today you received the following chemotherapy agents :  Keytruda.  To help prevent nausea and vomiting after your treatment, we encourage you to take your nausea medication as prescribed.   If you develop nausea and vomiting that is not controlled by your nausea medication, call the clinic.   BELOW ARE SYMPTOMS THAT SHOULD BE REPORTED IMMEDIATELY:  *FEVER GREATER THAN 100.5 F  *CHILLS WITH OR WITHOUT FEVER  NAUSEA AND VOMITING THAT IS NOT CONTROLLED WITH YOUR NAUSEA MEDICATION  *UNUSUAL SHORTNESS OF BREATH  *UNUSUAL BRUISING OR BLEEDING  TENDERNESS IN MOUTH AND THROAT WITH OR WITHOUT PRESENCE OF ULCERS  *URINARY PROBLEMS  *BOWEL PROBLEMS  UNUSUAL RASH Items with * indicate a potential emergency and should be followed up as soon as possible.  Feel free to call the clinic you have any questions or concerns. The clinic phone number is [phone].  Please show the [facility] at check-in to the Emergency Department and triage nurse.

## 2017-01-11 NOTE — Telephone Encounter (Signed)
Follow Up   Patient calling back to get lab results. Requesting call back. Home number has been updated.

## 2017-01-11 NOTE — Telephone Encounter (Signed)
Pt has been notified of lab results by phon with verbal understanding. Pt thanked me for my call. I stated to the pt that I saw when she returned my call that the operator stated she update the home # which was entered in as [phone] (H). I explained to the pt I called that new home # given but this was not correct since a woman answered and said I had the wrong #. I asked pt if I could have the correct home # so that I may correct her chart. Pt gave me home # [phone]. Pt said the operator kept reversing the 4 and the 5 and never read back # to her to make sure she had it correct in the chart. I have corrected her home # today to read [phone] per pt. Pt thanked me for my call and my help today. Pt asked when did she need to come back. I reviewed her last ov note from [facility] 08/2016 which states 1 yr f/u with Dr. Acie Fredrickson 08/2017. Pt advised a letter will be mailed out to her a few months earlier.

## 2017-01-24 ENCOUNTER — Ambulatory Visit (HOSPITAL_COMMUNITY)
Admission: RE | Admit: 2017-01-24 | Discharge: 2017-01-24 | Disposition: A | Discharge status: Home / Self Care | Payer: BLUE CROSS/BLUE SHIELD | Source: Ambulatory Visit | Attending: Internal Medicine | Admitting: Internal Medicine

## 2017-01-24 ENCOUNTER — Encounter (HOSPITAL_COMMUNITY): Payer: Self-pay

## 2017-01-24 DIAGNOSIS — C3432 Malignant neoplasm of lower lobe, left bronchus or lung: Secondary | ICD-10-CM

## 2017-01-24 DIAGNOSIS — Z902 Acquired absence of lung [part of]: Secondary | ICD-10-CM | POA: Diagnosis not present

## 2017-01-24 DIAGNOSIS — Z9889 Other specified postprocedural states: Secondary | ICD-10-CM | POA: Insufficient documentation

## 2017-01-24 DIAGNOSIS — I2510 Atherosclerotic heart disease of native coronary artery without angina pectoris: Secondary | ICD-10-CM | POA: Insufficient documentation

## 2017-01-24 DIAGNOSIS — E039 Hypothyroidism, unspecified: Secondary | ICD-10-CM | POA: Insufficient documentation

## 2017-01-24 DIAGNOSIS — J432 Centrilobular emphysema: Secondary | ICD-10-CM | POA: Insufficient documentation

## 2017-01-24 DIAGNOSIS — Z5112 Encounter for antineoplastic immunotherapy: Secondary | ICD-10-CM

## 2017-01-24 DIAGNOSIS — Z9049 Acquired absence of other specified parts of digestive tract: Secondary | ICD-10-CM | POA: Insufficient documentation

## 2017-01-24 DIAGNOSIS — I700 Atherosclerosis of aorta: Secondary | ICD-10-CM | POA: Diagnosis not present

## 2017-01-24 MED ORDER — IOPAMIDOL (ISOVUE-300) INJECTION 61%
INTRAVENOUS | Status: AC
  Administered 2017-01-24: 100 mL via INTRAVENOUS
  Filled 2017-01-24: qty 100

## 2017-01-24 MED ORDER — IOPAMIDOL (ISOVUE-300) INJECTION 61%
INTRAVENOUS | Status: AC
Start: 2017-01-24 — End: 2017-01-24
  Administered 2017-01-24: 30 mL via ORAL
  Filled 2017-01-24: qty 30

## 2017-01-24 MED ORDER — IOPAMIDOL (ISOVUE-300) INJECTION 61%
30.0000 mL | Freq: Once | INTRAVENOUS | Status: AC | PRN
  Administered 2017-01-24: 30 mL via ORAL

## 2017-01-24 MED ORDER — IOPAMIDOL (ISOVUE-300) INJECTION 61%
100.0000 mL | Freq: Once | INTRAVENOUS | Status: AC | PRN
  Administered 2017-01-24: 100 mL via INTRAVENOUS

## 2017-01-31 ENCOUNTER — Ambulatory Visit (HOSPITAL_BASED_OUTPATIENT_CLINIC_OR_DEPARTMENT_OTHER): Payer: BLUE CROSS/BLUE SHIELD

## 2017-01-31 ENCOUNTER — Telehealth: Payer: Self-pay | Admitting: Internal Medicine

## 2017-01-31 ENCOUNTER — Encounter: Payer: Self-pay | Admitting: Internal Medicine

## 2017-01-31 ENCOUNTER — Other Ambulatory Visit (HOSPITAL_BASED_OUTPATIENT_CLINIC_OR_DEPARTMENT_OTHER): Payer: BLUE CROSS/BLUE SHIELD

## 2017-01-31 ENCOUNTER — Ambulatory Visit (HOSPITAL_BASED_OUTPATIENT_CLINIC_OR_DEPARTMENT_OTHER): Payer: BLUE CROSS/BLUE SHIELD | Admitting: Internal Medicine

## 2017-01-31 VITALS — BP 127/70 | HR 58 | Temp 98.3°F | Resp 18 | Wt 143.3 lb

## 2017-01-31 DIAGNOSIS — Z5112 Encounter for antineoplastic immunotherapy: Secondary | ICD-10-CM

## 2017-01-31 DIAGNOSIS — C3412 Malignant neoplasm of upper lobe, left bronchus or lung: Secondary | ICD-10-CM

## 2017-01-31 DIAGNOSIS — C3432 Malignant neoplasm of lower lobe, left bronchus or lung: Secondary | ICD-10-CM | POA: Diagnosis not present

## 2017-01-31 DIAGNOSIS — E039 Hypothyroidism, unspecified: Secondary | ICD-10-CM

## 2017-01-31 DIAGNOSIS — I2510 Atherosclerotic heart disease of native coronary artery without angina pectoris: Secondary | ICD-10-CM

## 2017-01-31 LAB — CBC WITH DIFFERENTIAL/PLATELET
BASO%: 0.3 % (ref 0.0–2.0)
BASOS ABS: 0 10*3/uL (ref 0.0–0.1)
EOS ABS: 0.2 10*3/uL (ref 0.0–0.5)
EOS%: 2.2 % (ref 0.0–7.0)
HEMATOCRIT: 44.4 % (ref 34.8–46.6)
HGB: 14.9 g/dL (ref 11.6–15.9)
LYMPH#: 1.9 10*3/uL (ref 0.9–3.3)
LYMPH%: 28.3 % (ref 14.0–49.7)
MCH: 32.8 pg (ref 25.1–34.0)
MCHC: 33.6 g/dL (ref 31.5–36.0)
MCV: 97.8 fL (ref 79.5–101.0)
MONO#: 0.4 10*3/uL (ref 0.1–0.9)
MONO%: 6.5 % (ref 0.0–14.0)
NEUT#: 4.3 10*3/uL (ref 1.5–6.5)
NEUT%: 62.7 % (ref 38.4–76.8)
PLATELETS: 220 10*3/uL (ref 145–400)
RBC: 4.54 10*6/uL (ref 3.70–5.45)
RDW: 14 % (ref 11.2–14.5)
WBC: 6.8 10*3/uL (ref 3.9–10.3)

## 2017-01-31 LAB — COMPREHENSIVE METABOLIC PANEL
ALT: 17 U/L (ref 0–55)
ANION GAP: 8 meq/L (ref 3–11)
AST: 20 U/L (ref 5–34)
Albumin: 3.2 g/dL — ABNORMAL LOW (ref 3.5–5.0)
Alkaline Phosphatase: 93 U/L (ref 40–150)
BUN: 11.2 mg/dL (ref 7.0–26.0)
CALCIUM: 9.7 mg/dL (ref 8.4–10.4)
CHLORIDE: 109 meq/L (ref 98–109)
CO2: 23 mEq/L (ref 22–29)
CREATININE: 0.8 mg/dL (ref 0.6–1.1)
EGFR: 89 mL/min/{1.73_m2} — AB (ref >90)
Glucose: 142 mg/dl — ABNORMAL HIGH (ref 70–140)
Potassium: 4.1 mEq/L (ref 3.5–5.1)
Sodium: 140 mEq/L (ref 136–145)
Total Bilirubin: 0.31 mg/dL (ref 0.20–1.20)
Total Protein: 7.1 g/dL (ref 6.4–8.3)

## 2017-01-31 LAB — TSH

## 2017-01-31 MED ORDER — SODIUM CHLORIDE 0.9 % IV SOLN
Freq: Once | INTRAVENOUS | Status: AC
  Administered 2017-01-31: 11:00:00 via INTRAVENOUS

## 2017-01-31 MED ORDER — SODIUM CHLORIDE 0.9 % IV SOLN
200.0000 mg | Freq: Once | INTRAVENOUS | Status: AC
  Administered 2017-01-31: 200 mg via INTRAVENOUS
  Filled 2017-01-31: qty 8

## 2017-01-31 NOTE — Patient Instructions (Signed)
[facility] Discharge Instructions for Patients Receiving Chemotherapy  Today you received the following chemotherapy agents: Keytruda   To help prevent nausea and vomiting after your treatment, we encourage you to take your nausea medication as directed    If you develop nausea and vomiting that is not controlled by your nausea medication, call the clinic.   BELOW ARE SYMPTOMS THAT SHOULD BE REPORTED IMMEDIATELY:  *FEVER GREATER THAN 100.5 F  *CHILLS WITH OR WITHOUT FEVER  NAUSEA AND VOMITING THAT IS NOT CONTROLLED WITH YOUR NAUSEA MEDICATION  *UNUSUAL SHORTNESS OF BREATH  *UNUSUAL BRUISING OR BLEEDING  TENDERNESS IN MOUTH AND THROAT WITH OR WITHOUT PRESENCE OF ULCERS  *URINARY PROBLEMS  *BOWEL PROBLEMS  UNUSUAL RASH Items with * indicate a potential emergency and should be followed up as soon as possible.  Feel free to call the clinic you have any questions or concerns. The clinic phone number is [phone].  Please show the CHEMO ALERT CARD at check-in to the Emergency Department and triage nurse.   

## 2017-01-31 NOTE — Telephone Encounter (Signed)
Scheduled appt per 9/4 los - patient to get new schedule - next visit.

## 2017-01-31 NOTE — Progress Notes (Signed)
[place] [facility] [phone]   Fax:(336) [place], [place] [address]  DIAGNOSIS: Recurrent non-small cell lung cancer, adenocarcinoma with PDL 1 expression of 60% diagnosed in November 2016. The patient was initially diagnosed as stage IB (T2a, N0, M0) adenocarcinoma in March 2016.  PRIOR THERAPY:  Status post left video-assisted thoracoscopy, wedge resection left lower lobe, thoracoscopic left lower lobectomy and mediastinal lymph node dissection on 08/14/2014.   CURRENT THERAPY: Immunotherapy with Ketruda (pembrolizumab) 200 mg IV every 3 weeks status post 30 cycles. First dose was given 05/12/2015.   INTERVAL HISTORY: Sharon [name] 56 y.o. female returns to the clinic today for follow-up visit accompanied by her daughter. The patient is feeling fine today with no specific complaints except for feeling cold. She tolerated the last cycle of her treatment with immunotherapy fairly well. She denied having any weight loss or night sweats. She has no nausea, vomiting, diarrhea or constipation. She denied having any fever or chills. She has no chest pain, shortness of breath, cough or hemoptysis. She had repeat CT scan of the chest, abdomen and pelvis performed recently and she is here for evaluation and discussion of her scan results.  MEDICAL HISTORY: Past Medical History:  Diagnosis Date  . Aortic insufficiency    Echo 8/09:  EF 55-60, impaired relaxation, mild aortic stenosis (mean 8.1), moderate aortic insufficiency, trace MR, trace TR // Echo 4/18: mild LVH, EF 55-60, no RWMA, Gr 1 DD, mod AI   . Arthritis   . Carotid artery disease ([place]) 03/09/2011   Carotid US 8/15 - R 40-59; L1-39 >> follow-up 1 year // Carotid US 4/18: ICA 1-39 bilaterally - repeat 1 yr  . COPD (chronic obstructive pulmonary disease) ([place])   . Coronary artery disease involving native coronary artery of native heart without angina  pectoris 11/24/2009   s/p ant STEMI 5/09 >> LHC 5/09 - LM normal; LAD proximal 99; branching DX with one branch occluded and the other severely diseased; LCx okay with irregularities in the OM1; RCA proximal occluded-CTO; EF 30-40, anterior wall HK  >>  PCI: 3 x 18 mm Promus DES, 2.5 x 15 mm Promus DES to the LAD  . Emphysema of lung ([place])   . Headache 09/06/2016  . History of acute anterior wall MI 12/2007   s/p DES x 2 to LAD  . Hyperlipidemia   . Ischemic cardiomyopathy   . Non-small cell lung cancer ([place])    per CT CHEST/PET 2/4 and 07/18/14 @ [facility]  . Pneumonia    2015   HX BRONCHITIS    . Tobacco user     ALLERGIES:  is allergic to codeine; lipitor [atorvastatin calcium]; morphine and related; and oxycodone-acetaminophen.  MEDICATIONS:  Current Outpatient Prescriptions  Medication Sig Dispense Refill  . aspirin (ASPIR-LOW) 81 MG EC tablet Take 81 mg by mouth daily. On hold    . clopidogrel (PLAVIX) 75 MG tablet TAKE 1 TABLET BY MOUTH EVERY DAY 90 tablet 2  . levothyroxine (SYNTHROID, LEVOTHROID) 125 MCG tablet Take 1 tablet (125 mcg total) by mouth daily before breakfast. 30 tablet 0  . Magnesium 250 MG TABS Take 250 mg by mouth daily.    . ondansetron (ZOFRAN) 8 MG tablet TAKE 1 TABLET (8 MG TOTAL) BY MOUTH EVERY 8 (EIGHT) HOURS AS NEEDED FOR NAUSEA OR VOMITING. 30 tablet 0  . Pembrolizumab (KEYTRUDA IV) TAKE KEYTRUDA  IV INFUSION  ONCE EVERY 3 WEEKS FOR CHEMO( PT. NOT SURE OF DOSAGE)    . potassium chloride (MICRO-K) 10 MEQ CR capsule Take 10 mEq by mouth 2 (two) times daily.  0  . rosuvastatin (CRESTOR) 40 MG tablet Take 1 tablet (40 mg total) by mouth daily. 90 tablet 3  . traMADol (ULTRAM) 50 MG tablet Take 50 mg by mouth every 6 (six) hours as needed for moderate pain or severe pain.     No current facility-administered medications for this visit.     SURGICAL HISTORY:  Past Surgical History:  Procedure Laterality Date  . CARDIAC CATHETERIZATION  10/15/2007   EF 30-40%    . CESAREAN SECTION    . CHOLECYSTECTOMY    . CORONARY ANGIOPLASTY WITH STENT PLACEMENT     LAD  . ectopic pregnancies requiring laparotomies    . LEAD REMOVAL Left 08/14/2014   Procedure: CRYO INTERCOSTAL NERVE BLOCK;  Surgeon: Melrose Nakayama, MD;  Location: [facility];  Service: Thoracic;  Laterality: Left;  . LOBECTOMY Left 08/14/2014   Procedure: LEFT LOWER LOBECTOMY;  Surgeon: Melrose Nakayama, MD;  Location: [facility];  Service: Thoracic;  Laterality: Left;  . NODE DISSECTION Left 08/14/2014   Procedure: NODE DISSECTION, LEFT LOWER LOBE LUNG;  Surgeon: Melrose Nakayama, MD;  Location: [facility];  Service: Thoracic;  Laterality: Left;  . SHOULDER SURGERY     LEFT X 2   (REMOVED SOME COLLAR BONE )  . TRANSTHORACIC ECHOCARDIOGRAM  10/17/2007  . TUBAL LIGATION    . US ECHOCARDIOGRAPHY  01/07/2008   EF 55-60%  . VIDEO ASSISTED THORACOSCOPY (VATS)/WEDGE RESECTION Left 08/14/2014   Procedure: VIDEO ASSISTED THORACOSCOPY (VATS)/WEDGE RESECTION;  Surgeon: Melrose Nakayama, MD;  Location: [facility];  Service: Thoracic;  Laterality: Left;    REVIEW OF SYSTEMS:  Constitutional: negative Eyes: negative Ears, nose, mouth, throat, and face: negative Respiratory: negative Cardiovascular: negative Gastrointestinal: negative Genitourinary:negative Integument/breast: negative Hematologic/lymphatic: negative Musculoskeletal:negative Neurological: negative Behavioral/Psych: negative Endocrine: negative Allergic/Immunologic: negative   PHYSICAL EXAMINATION: General appearance: alert, cooperative and no distress Head: Normocephalic, without obvious abnormality, atraumatic Neck: no adenopathy, no JVD, supple, symmetrical, trachea midline and thyroid not enlarged, symmetric, no tenderness/mass/nodules Lymph nodes: Cervical, supraclavicular, and axillary nodes normal. Resp: clear to auscultation bilaterally Back: negative Cardio: regular rate and rhythm, S1, S2 normal, no murmur, click, rub or  gallop GI: soft, non-tender; bowel sounds normal; no masses,  no organomegaly Extremities: extremities normal, atraumatic, no cyanosis or edema Neurologic: Alert and oriented X 3, normal strength and tone. Normal symmetric reflexes. Normal coordination and gait  ECOG PERFORMANCE STATUS: 1 - Symptomatic but completely ambulatory  Blood pressure 127/70, pulse (!) 58, temperature 98.3 F (36.8 C), temperature source Oral, resp. rate 18, weight 143 lb 4.8 oz (65 kg), SpO2 97 %.  LABORATORY DATA: Lab Results  Component Value Date   WBC 6.8 01/31/2017   HGB 14.9 01/31/2017   HCT 44.4 01/31/2017   MCV 97.8 01/31/2017   PLT 220 01/31/2017      Chemistry      Component Value Date/Time   NA 140 01/10/2017 1029   K 4.2 01/10/2017 1029   CL 109 08/16/2014 0520   CO2 22 01/10/2017 1029   BUN 12.2 01/10/2017 1029   CREATININE 0.7 01/10/2017 1029      Component Value Date/Time   CALCIUM 9.8 01/10/2017 1029   ALKPHOS 93 01/10/2017 1029   AST 23 01/10/2017 1029   ALT 19 01/10/2017 1029   BILITOT 0.59 01/10/2017  1029       RADIOGRAPHIC STUDIES: Ct Chest W Contrast  Result Date: 01/24/2017 CLINICAL DATA:  56 year old female with history of lung cancer diagnosed in 2016 status post surgical resection (left lower lobectomy), currently undergoing Keytruda therapy. EXAM: CT CHEST, ABDOMEN, AND PELVIS WITH CONTRAST TECHNIQUE: Multidetector CT imaging of the chest, abdomen and pelvis was performed following the standard protocol during bolus administration of intravenous contrast. CONTRAST:  100 mL of Isovue-300. COMPARISON:  CT the chest, abdomen and pelvis 09/05/2016. FINDINGS: CT CHEST FINDINGS Cardiovascular: Heart size is normal. There is no significant pericardial fluid, thickening or pericardial calcification. There is aortic atherosclerosis, as well as atherosclerosis of the great vessels of the mediastinum and the coronary arteries, including calcified atherosclerotic plaque in the left  main, left anterior descending, left circumflex and right coronary arteries. Calcifications and thickening of the aortic valve. Ectasia of the ascending thoracic aorta (4.0 cm in diameter). Mediastinum/Nodes: Borderline enlarged mediastinal lymph nodes measuring up to 9 mm in short axis in the prevascular and right paratracheal nodal stations. No hilar lymphadenopathy. Esophagus is unremarkable in appearance. No axillary lymphadenopathy. Lungs/Pleura: Status post left lower lobectomy. There is also suture line along the medial aspect of the left upper lobe near the apex, suggesting prior wedge resection. Stable nodular postoperative scarring in the inferior aspect of the left upper lobe near the lung base (axial image 113 of series 4). There is also some linear scarring in the medial segment of the right middle lobe. No definite suspicious appearing pulmonary nodules or masses are noted on today's examination. No acute consolidative airspace disease. No pleural effusions. Diffuse bronchial wall thickening with mild to moderate centrilobular and paraseptal emphysema. Musculoskeletal: There are no aggressive appearing lytic or blastic lesions noted in the visualized portions of the skeleton. CT ABDOMEN PELVIS FINDINGS Hepatobiliary: No cystic or solid hepatic lesions. No intra or extrahepatic biliary ductal dilatation. Status post cholecystectomy. Pancreas: No pancreatic mass. No pancreatic ductal dilatation. No pancreatic or peripancreatic fluid or inflammatory changes. Spleen: Unremarkable. Adrenals/Urinary Tract: Bilateral kidneys and bilateral adrenal glands are normal in appearance. No hydroureteronephrosis. Urinary bladder is normal in appearance. Stomach/Bowel: Normal appearance of the stomach. No pathologic dilatation of small bowel or colon. Normal appendix. Vascular/Lymphatic: Aortic atherosclerosis, without evidence of aneurysm or dissection in the abdominal or pelvic vasculature. Moderate to severe  narrowing of the distal infrarenal abdominal aorta which has a minimal luminal diameter of 7 x 5 mm (mean diameter 6 mm) shortly above the level of the aortic bifurcation. No lymphadenopathy noted in the abdomen or pelvis. Reproductive: Uterus and ovaries are atrophic. Other: No significant volume of ascites.  No pneumoperitoneum. Musculoskeletal: There are no aggressive appearing lytic or blastic lesions noted in the visualized portions of the skeleton. IMPRESSION: 1. Status post left lower lobectomy. No findings to suggest recurrent or metastatic disease in the chest, abdomen or pelvis. 2. Mild diffuse bronchial wall thickening with mild to moderate centrilobular and paraseptal emphysema; imaging findings suggestive of underlying COPD. 3. Aortic atherosclerosis, in addition to left main and 3 vessel coronary artery disease. Please note that although the presence of coronary artery calcium documents the presence of coronary artery disease, the severity of this disease and any potential stenosis cannot be assessed on this non-gated CT examination. Assessment for potential risk factor modification, dietary therapy or pharmacologic therapy may be warranted, if clinically indicated. 4. In addition, there is ectasia of the ascending thoracic aorta (4.0 cm in diameter). Recommend annual imaging followup by CTA  or MRA. This recommendation follows 2010 ACCF/AHA/AATS/ACR/ASA/SCA/SCAI/SIR/STS/SVM Guidelines for the Diagnosis and Management of Patients with Thoracic Aortic Disease. Circulation. 2010; 121: S063-K160. 5. There are calcifications of the aortic valve. Echocardiographic correlation for evaluation of potential valvular dysfunction may be warranted if clinically indicated. 6. Additional incidental findings, as above Electronically Signed   By: Vinnie Langton M.D.   On: 01/24/2017 13:01   Ct Abdomen Pelvis W Contrast  Result Date: 01/24/2017 CLINICAL DATA:  56 year old female with history of lung cancer  diagnosed in 2016 status post surgical resection (left lower lobectomy), currently undergoing Keytruda therapy. EXAM: CT CHEST, ABDOMEN, AND PELVIS WITH CONTRAST TECHNIQUE: Multidetector CT imaging of the chest, abdomen and pelvis was performed following the standard protocol during bolus administration of intravenous contrast. CONTRAST:  100 mL of Isovue-300. COMPARISON:  CT the chest, abdomen and pelvis 09/05/2016. FINDINGS: CT CHEST FINDINGS Cardiovascular: Heart size is normal. There is no significant pericardial fluid, thickening or pericardial calcification. There is aortic atherosclerosis, as well as atherosclerosis of the great vessels of the mediastinum and the coronary arteries, including calcified atherosclerotic plaque in the left main, left anterior descending, left circumflex and right coronary arteries. Calcifications and thickening of the aortic valve. Ectasia of the ascending thoracic aorta (4.0 cm in diameter). Mediastinum/Nodes: Borderline enlarged mediastinal lymph nodes measuring up to 9 mm in short axis in the prevascular and right paratracheal nodal stations. No hilar lymphadenopathy. Esophagus is unremarkable in appearance. No axillary lymphadenopathy. Lungs/Pleura: Status post left lower lobectomy. There is also suture line along the medial aspect of the left upper lobe near the apex, suggesting prior wedge resection. Stable nodular postoperative scarring in the inferior aspect of the left upper lobe near the lung base (axial image 113 of series 4). There is also some linear scarring in the medial segment of the right middle lobe. No definite suspicious appearing pulmonary nodules or masses are noted on today's examination. No acute consolidative airspace disease. No pleural effusions. Diffuse bronchial wall thickening with mild to moderate centrilobular and paraseptal emphysema. Musculoskeletal: There are no aggressive appearing lytic or blastic lesions noted in the visualized portions of  the skeleton. CT ABDOMEN PELVIS FINDINGS Hepatobiliary: No cystic or solid hepatic lesions. No intra or extrahepatic biliary ductal dilatation. Status post cholecystectomy. Pancreas: No pancreatic mass. No pancreatic ductal dilatation. No pancreatic or peripancreatic fluid or inflammatory changes. Spleen: Unremarkable. Adrenals/Urinary Tract: Bilateral kidneys and bilateral adrenal glands are normal in appearance. No hydroureteronephrosis. Urinary bladder is normal in appearance. Stomach/Bowel: Normal appearance of the stomach. No pathologic dilatation of small bowel or colon. Normal appendix. Vascular/Lymphatic: Aortic atherosclerosis, without evidence of aneurysm or dissection in the abdominal or pelvic vasculature. Moderate to severe narrowing of the distal infrarenal abdominal aorta which has a minimal luminal diameter of 7 x 5 mm (mean diameter 6 mm) shortly above the level of the aortic bifurcation. No lymphadenopathy noted in the abdomen or pelvis. Reproductive: Uterus and ovaries are atrophic. Other: No significant volume of ascites.  No pneumoperitoneum. Musculoskeletal: There are no aggressive appearing lytic or blastic lesions noted in the visualized portions of the skeleton. IMPRESSION: 1. Status post left lower lobectomy. No findings to suggest recurrent or metastatic disease in the chest, abdomen or pelvis. 2. Mild diffuse bronchial wall thickening with mild to moderate centrilobular and paraseptal emphysema; imaging findings suggestive of underlying COPD. 3. Aortic atherosclerosis, in addition to left main and 3 vessel coronary artery disease. Please note that although the presence of coronary artery calcium documents the presence  of coronary artery disease, the severity of this disease and any potential stenosis cannot be assessed on this non-gated CT examination. Assessment for potential risk factor modification, dietary therapy or pharmacologic therapy may be warranted, if clinically indicated. 4.  In addition, there is ectasia of the ascending thoracic aorta (4.0 cm in diameter). Recommend annual imaging followup by CTA or MRA. This recommendation follows 2010 ACCF/AHA/AATS/ACR/ASA/SCA/SCAI/SIR/STS/SVM Guidelines for the Diagnosis and Management of Patients with Thoracic Aortic Disease. Circulation. 2010; 121: B559-R416. 5. There are calcifications of the aortic valve. Echocardiographic correlation for evaluation of potential valvular dysfunction may be warranted if clinically indicated. 6. Additional incidental findings, as above Electronically Signed   By: Vinnie Langton M.D.   On: 01/24/2017 13:01    ASSESSMENT AND PLAN:  This is a very pleasant 56 years old white female with recurrent non-small cell lung cancer, adenocarcinoma with positive PDL 1 expression of 60%. The patient is currently on treatment with immunotherapy with Ketruda 200 mg IV every 3 weeks status post 30 cycles. The patient tolerated the last cycle of her treatment well with no significant adverse effects. She had repeat CT scan of the chest, abdomen and pelvis performed recently. I personally and independently reviewed the scans and discuss the results with the patient and her daughter. Her scan showed no evidence for disease progression. I recommended for the patient to continue her current treatment with [place] and she will proceed with cycle #31 today. For the hypothyroidism, the patient will continue her current treatment with levothyroxine and will continue to monitor her TSH closely. For the coronary arteries disease seen on the recent scan, the patient is followed by cardiology for evaluation of her condition. She was advised to call immediately if she has any concerning symptoms in the interval. The patient voices understanding of current disease status and treatment options and is in agreement with the current care plan. All questions were answered. The patient knows to call the clinic with any problems,  questions or concerns. We can certainly see the patient much sooner if necessary.  Disclaimer: This note was dictated with voice recognition software. Similar sounding words can inadvertently be transcribed and may not be corrected upon review.

## 2017-02-20 ENCOUNTER — Other Ambulatory Visit: Payer: Self-pay | Admitting: Medical Oncology

## 2017-02-20 DIAGNOSIS — E039 Hypothyroidism, unspecified: Secondary | ICD-10-CM

## 2017-02-20 DIAGNOSIS — C3432 Malignant neoplasm of lower lobe, left bronchus or lung: Secondary | ICD-10-CM

## 2017-02-20 MED ORDER — LEVOTHYROXINE SODIUM 125 MCG PO TABS: 125.0000 ug | ORAL_TABLET | Freq: Every day | ORAL | 0 refills | Status: DC

## 2017-02-21 ENCOUNTER — Ambulatory Visit (HOSPITAL_BASED_OUTPATIENT_CLINIC_OR_DEPARTMENT_OTHER): Payer: BLUE CROSS/BLUE SHIELD | Admitting: Internal Medicine

## 2017-02-21 ENCOUNTER — Ambulatory Visit (HOSPITAL_BASED_OUTPATIENT_CLINIC_OR_DEPARTMENT_OTHER): Payer: BLUE CROSS/BLUE SHIELD

## 2017-02-21 ENCOUNTER — Encounter: Payer: Self-pay | Admitting: Internal Medicine

## 2017-02-21 ENCOUNTER — Other Ambulatory Visit (HOSPITAL_BASED_OUTPATIENT_CLINIC_OR_DEPARTMENT_OTHER): Payer: BLUE CROSS/BLUE SHIELD

## 2017-02-21 ENCOUNTER — Other Ambulatory Visit: Payer: Self-pay | Admitting: Medical Oncology

## 2017-02-21 DIAGNOSIS — E039 Hypothyroidism, unspecified: Secondary | ICD-10-CM

## 2017-02-21 DIAGNOSIS — Z5112 Encounter for antineoplastic immunotherapy: Secondary | ICD-10-CM

## 2017-02-21 DIAGNOSIS — C3432 Malignant neoplasm of lower lobe, left bronchus or lung: Secondary | ICD-10-CM

## 2017-02-21 DIAGNOSIS — R5382 Chronic fatigue, unspecified: Secondary | ICD-10-CM

## 2017-02-21 DIAGNOSIS — C3412 Malignant neoplasm of upper lobe, left bronchus or lung: Secondary | ICD-10-CM

## 2017-02-21 DIAGNOSIS — Z79899 Other long term (current) drug therapy: Secondary | ICD-10-CM | POA: Diagnosis not present

## 2017-02-21 LAB — COMPREHENSIVE METABOLIC PANEL
ALT: 13 U/L (ref 0–55)
AST: 19 U/L (ref 5–34)
Albumin: 3.5 g/dL (ref 3.5–5.0)
Alkaline Phosphatase: 99 U/L (ref 40–150)
Anion Gap: 9 mEq/L (ref 3–11)
BUN: 12.7 mg/dL (ref 7.0–26.0)
CALCIUM: 10.1 mg/dL (ref 8.4–10.4)
CHLORIDE: 109 meq/L (ref 98–109)
CO2: 24 mEq/L (ref 22–29)
Creatinine: 0.7 mg/dL (ref 0.6–1.1)
Glucose: 87 mg/dl (ref 70–140)
POTASSIUM: 3.9 meq/L (ref 3.5–5.1)
SODIUM: 142 meq/L (ref 136–145)
Total Bilirubin: 0.39 mg/dL (ref 0.20–1.20)
Total Protein: 7.5 g/dL (ref 6.4–8.3)

## 2017-02-21 LAB — CBC WITH DIFFERENTIAL/PLATELET
BASO%: 1.1 % (ref 0.0–2.0)
BASOS ABS: 0.1 10*3/uL (ref 0.0–0.1)
EOS ABS: 0.2 10*3/uL (ref 0.0–0.5)
EOS%: 2.4 % (ref 0.0–7.0)
HCT: 47.4 % — ABNORMAL HIGH (ref 34.8–46.6)
HEMOGLOBIN: 16.1 g/dL — AB (ref 11.6–15.9)
LYMPH%: 26.4 % (ref 14.0–49.7)
MCH: 33.2 pg (ref 25.1–34.0)
MCHC: 34.1 g/dL (ref 31.5–36.0)
MCV: 97.4 fL (ref 79.5–101.0)
MONO#: 0.7 10*3/uL (ref 0.1–0.9)
MONO%: 8.6 % (ref 0.0–14.0)
NEUT#: 4.7 10*3/uL (ref 1.5–6.5)
NEUT%: 61.5 % (ref 38.4–76.8)
Platelets: 214 10*3/uL (ref 145–400)
RBC: 4.86 10*6/uL (ref 3.70–5.45)
RDW: 14.3 % (ref 11.2–14.5)
WBC: 7.7 10*3/uL (ref 3.9–10.3)
lymph#: 2 10*3/uL (ref 0.9–3.3)

## 2017-02-21 LAB — TSH: TSH: <0.08 m(IU)/L — ABNORMAL LOW (ref 0.308–3.960)

## 2017-02-21 MED ORDER — SODIUM CHLORIDE 0.9 % IV SOLN
200.0000 mg | Freq: Once | INTRAVENOUS | Status: AC
  Administered 2017-02-21: 200 mg via INTRAVENOUS
  Filled 2017-02-21: qty 8

## 2017-02-21 MED ORDER — LEVOTHYROXINE SODIUM 125 MCG PO TABS: 125.0000 ug | ORAL_TABLET | Freq: Every day | ORAL | 0 refills | Status: DC

## 2017-02-21 MED ORDER — SODIUM CHLORIDE 0.9 % IV SOLN
Freq: Once | INTRAVENOUS | Status: AC
  Administered 2017-02-21: 13:00:00 via INTRAVENOUS

## 2017-02-21 NOTE — Progress Notes (Signed)
[facility] [phone]   Fax:(336) [place], NP [place] [address]  DIAGNOSIS: Recurrent non-small cell lung cancer, adenocarcinoma with PDL 1 expression of 60% diagnosed in November 2016. The patient was initially diagnosed as stage IB (T2a, N0, M0) adenocarcinoma in March 2016.  PRIOR THERAPY:  Status post left video-assisted thoracoscopy, wedge resection left lower lobe, thoracoscopic left lower lobectomy and mediastinal lymph node dissection on 08/14/2014.   CURRENT THERAPY: Immunotherapy with Ketruda (pembrolizumab) 200 mg IV every 3 weeks status post 31 cycles. First dose was given 05/12/2015.   INTERVAL HISTORY: Sharon [name] 56 y.o. female returns to the clinic today for follow-up visit accompanied by her daughter. The patient has no complaints today. She continues to tolerate her treatment with [facility] fairly well. She denied having any chest pain, shortness breath, cough or hemoptysis. She denied having any fever or chills. She has no nausea, vomiting, diarrhea or constipation. She denied having any skin rash. She will today for evaluation before starting cycle #32.  MEDICAL HISTORY: Past Medical History:  Diagnosis Date  . Aortic insufficiency    Echo 8/09:  EF 55-60, impaired relaxation, mild aortic stenosis (mean 8.1), moderate aortic insufficiency, trace MR, trace TR // Echo 4/18: mild LVH, EF 55-60, no RWMA, Gr 1 DD, mod AI   . Arthritis   . Carotid artery disease ([facility]) 03/09/2011   Carotid US 8/15 - R 40-59; L1-39 >> follow-up 1 year // Carotid US 4/18: ICA 1-39 bilaterally - repeat 1 yr  . COPD (chronic obstructive pulmonary disease) ([facility])   . Coronary artery disease involving native coronary artery of native heart without angina pectoris 11/24/2009   s/p ant STEMI 5/09 >> LHC 5/09 - LM normal; LAD proximal 99; branching DX with one branch occluded and the other severely diseased; LCx  okay with irregularities in the OM1; RCA proximal occluded-CTO; EF 30-40, anterior wall HK  >>  PCI: 3 x 18 mm Promus DES, 2.5 x 15 mm Promus DES to the LAD  . Emphysema of lung ([facility])   . Headache 09/06/2016  . History of acute anterior wall MI 12/2007   s/p DES x 2 to LAD  . Hyperlipidemia   . Ischemic cardiomyopathy   . Non-small cell lung cancer ([facility])    per CT CHEST/PET 2/4 and 07/18/14 @ [facility]  . Pneumonia    2015   HX BRONCHITIS    . Tobacco user     ALLERGIES:  is allergic to codeine; lipitor [atorvastatin calcium]; morphine and related; and oxycodone-acetaminophen.  MEDICATIONS:  Current Outpatient Prescriptions  Medication Sig Dispense Refill  . aspirin (ASPIR-LOW) 81 MG EC tablet Take 81 mg by mouth daily. On hold    . clopidogrel (PLAVIX) 75 MG tablet TAKE 1 TABLET BY MOUTH EVERY DAY 90 tablet 2  . levothyroxine (SYNTHROID, LEVOTHROID) 125 MCG tablet Take 1 tablet (125 mcg total) by mouth daily before breakfast. 90 tablet 0  . Magnesium 250 MG TABS Take 250 mg by mouth daily.    . ondansetron (ZOFRAN) 8 MG tablet TAKE 1 TABLET (8 MG TOTAL) BY MOUTH EVERY 8 (EIGHT) HOURS AS NEEDED FOR NAUSEA OR VOMITING. 30 tablet 0  . Pembrolizumab (KEYTRUDA IV) TAKE KEYTRUDA  IV INFUSION ONCE EVERY 3 WEEKS FOR CHEMO( PT. NOT SURE OF DOSAGE)    . potassium chloride (MICRO-K) 10 MEQ CR capsule Take 10 mEq by mouth 2 (two)  times daily.  0  . rosuvastatin (CRESTOR) 40 MG tablet Take 1 tablet (40 mg total) by mouth daily. 90 tablet 3  . traMADol (ULTRAM) 50 MG tablet Take 50 mg by mouth every 6 (six) hours as needed for moderate pain or severe pain.     No current facility-administered medications for this visit.     SURGICAL HISTORY:  Past Surgical History:  Procedure Laterality Date  . CARDIAC CATHETERIZATION  10/15/2007   EF 30-40%  . CESAREAN SECTION    . CHOLECYSTECTOMY    . CORONARY ANGIOPLASTY WITH STENT PLACEMENT     LAD  . ectopic pregnancies requiring laparotomies    . LEAD  REMOVAL Left 08/14/2014   Procedure: CRYO INTERCOSTAL NERVE BLOCK;  Surgeon: Melrose Nakayama, MD;  Location: [facility];  Service: Thoracic;  Laterality: Left;  . LOBECTOMY Left 08/14/2014   Procedure: LEFT LOWER LOBECTOMY;  Surgeon: Melrose Nakayama, MD;  Location: [facility];  Service: Thoracic;  Laterality: Left;  . NODE DISSECTION Left 08/14/2014   Procedure: NODE DISSECTION, LEFT LOWER LOBE LUNG;  Surgeon: Melrose Nakayama, MD;  Location: [facility];  Service: Thoracic;  Laterality: Left;  . SHOULDER SURGERY     LEFT X 2   (REMOVED SOME COLLAR BONE )  . TRANSTHORACIC ECHOCARDIOGRAM  10/17/2007  . TUBAL LIGATION    . US ECHOCARDIOGRAPHY  01/07/2008   EF 55-60%  . VIDEO ASSISTED THORACOSCOPY (VATS)/WEDGE RESECTION Left 08/14/2014   Procedure: VIDEO ASSISTED THORACOSCOPY (VATS)/WEDGE RESECTION;  Surgeon: Melrose Nakayama, MD;  Location: [facility];  Service: Thoracic;  Laterality: Left;    REVIEW OF SYSTEMS:  A comprehensive review of systems was negative.   PHYSICAL EXAMINATION: General appearance: alert, cooperative and no distress Head: Normocephalic, without obvious abnormality, atraumatic Neck: no adenopathy, no JVD, supple, symmetrical, trachea midline and thyroid not enlarged, symmetric, no tenderness/mass/nodules Lymph nodes: Cervical, supraclavicular, and axillary nodes normal. Resp: clear to auscultation bilaterally Back: negative Cardio: regular rate and rhythm, S1, S2 normal, no murmur, click, rub or gallop GI: soft, non-tender; bowel sounds normal; no masses,  no organomegaly Extremities: extremities normal, atraumatic, no cyanosis or edema  ECOG PERFORMANCE STATUS: 1 - Symptomatic but completely ambulatory  Blood pressure 121/89, pulse 75, temperature 98.2 F (36.8 C), temperature source Oral, resp. rate 18, height 5\' 4"  (1.626 m), weight 147 lb 1.6 oz (66.7 kg), SpO2 98 %.  LABORATORY DATA: Lab Results  Component Value Date   WBC 7.7 02/21/2017   HGB 16.1 (H) 02/21/2017     HCT 47.4 (H) 02/21/2017   MCV 97.4 02/21/2017   PLT 214 02/21/2017      Chemistry      Component Value Date/Time   NA 140 01/31/2017 0913   K 4.1 01/31/2017 0913   CL 109 08/16/2014 0520   CO2 23 01/31/2017 0913   BUN 11.2 01/31/2017 0913   CREATININE 0.8 01/31/2017 0913      Component Value Date/Time   CALCIUM 9.7 01/31/2017 0913   ALKPHOS 93 01/31/2017 0913   AST 20 01/31/2017 0913   ALT 17 01/31/2017 0913   BILITOT 0.31 01/31/2017 0913       RADIOGRAPHIC STUDIES: Ct Chest W Contrast  Result Date: 01/24/2017 CLINICAL DATA:  56 year old female with history of lung cancer diagnosed in 2016 status post surgical resection (left lower lobectomy), currently undergoing Keytruda therapy. EXAM: CT CHEST, ABDOMEN, AND PELVIS WITH CONTRAST TECHNIQUE: Multidetector CT imaging of the chest, abdomen and pelvis was performed following the standard  protocol during bolus administration of intravenous contrast. CONTRAST:  100 mL of Isovue-300. COMPARISON:  CT the chest, abdomen and pelvis 09/05/2016. FINDINGS: CT CHEST FINDINGS Cardiovascular: Heart size is normal. There is no significant pericardial fluid, thickening or pericardial calcification. There is aortic atherosclerosis, as well as atherosclerosis of the great vessels of the mediastinum and the coronary arteries, including calcified atherosclerotic plaque in the left main, left anterior descending, left circumflex and right coronary arteries. Calcifications and thickening of the aortic valve. Ectasia of the ascending thoracic aorta (4.0 cm in diameter). Mediastinum/Nodes: Borderline enlarged mediastinal lymph nodes measuring up to 9 mm in short axis in the prevascular and right paratracheal nodal stations. No hilar lymphadenopathy. Esophagus is unremarkable in appearance. No axillary lymphadenopathy. Lungs/Pleura: Status post left lower lobectomy. There is also suture line along the medial aspect of the left upper lobe near the apex,  suggesting prior wedge resection. Stable nodular postoperative scarring in the inferior aspect of the left upper lobe near the lung base (axial image 113 of series 4). There is also some linear scarring in the medial segment of the right middle lobe. No definite suspicious appearing pulmonary nodules or masses are noted on today's examination. No acute consolidative airspace disease. No pleural effusions. Diffuse bronchial wall thickening with mild to moderate centrilobular and paraseptal emphysema. Musculoskeletal: There are no aggressive appearing lytic or blastic lesions noted in the visualized portions of the skeleton. CT ABDOMEN PELVIS FINDINGS Hepatobiliary: No cystic or solid hepatic lesions. No intra or extrahepatic biliary ductal dilatation. Status post cholecystectomy. Pancreas: No pancreatic mass. No pancreatic ductal dilatation. No pancreatic or peripancreatic fluid or inflammatory changes. Spleen: Unremarkable. Adrenals/Urinary Tract: Bilateral kidneys and bilateral adrenal glands are normal in appearance. No hydroureteronephrosis. Urinary bladder is normal in appearance. Stomach/Bowel: Normal appearance of the stomach. No pathologic dilatation of small bowel or colon. Normal appendix. Vascular/Lymphatic: Aortic atherosclerosis, without evidence of aneurysm or dissection in the abdominal or pelvic vasculature. Moderate to severe narrowing of the distal infrarenal abdominal aorta which has a minimal luminal diameter of 7 x 5 mm (mean diameter 6 mm) shortly above the level of the aortic bifurcation. No lymphadenopathy noted in the abdomen or pelvis. Reproductive: Uterus and ovaries are atrophic. Other: No significant volume of ascites.  No pneumoperitoneum. Musculoskeletal: There are no aggressive appearing lytic or blastic lesions noted in the visualized portions of the skeleton. IMPRESSION: 1. Status post left lower lobectomy. No findings to suggest recurrent or metastatic disease in the chest, abdomen  or pelvis. 2. Mild diffuse bronchial wall thickening with mild to moderate centrilobular and paraseptal emphysema; imaging findings suggestive of underlying COPD. 3. Aortic atherosclerosis, in addition to left main and 3 vessel coronary artery disease. Please note that although the presence of coronary artery calcium documents the presence of coronary artery disease, the severity of this disease and any potential stenosis cannot be assessed on this non-gated CT examination. Assessment for potential risk factor modification, dietary therapy or pharmacologic therapy may be warranted, if clinically indicated. 4. In addition, there is ectasia of the ascending thoracic aorta (4.0 cm in diameter). Recommend annual imaging followup by CTA or MRA. This recommendation follows 2010 ACCF/AHA/AATS/ACR/ASA/SCA/SCAI/SIR/STS/SVM Guidelines for the Diagnosis and Management of Patients with Thoracic Aortic Disease. Circulation. 2010; 121: I297-L892. 5. There are calcifications of the aortic valve. Echocardiographic correlation for evaluation of potential valvular dysfunction may be warranted if clinically indicated. 6. Additional incidental findings, as above Electronically Signed   By: Vinnie Langton M.D.   On: 01/24/2017  13:01   Ct Abdomen Pelvis W Contrast  Result Date: 01/24/2017 CLINICAL DATA:  56 year old female with history of lung cancer diagnosed in 2016 status post surgical resection (left lower lobectomy), currently undergoing Keytruda therapy. EXAM: CT CHEST, ABDOMEN, AND PELVIS WITH CONTRAST TECHNIQUE: Multidetector CT imaging of the chest, abdomen and pelvis was performed following the standard protocol during bolus administration of intravenous contrast. CONTRAST:  100 mL of Isovue-300. COMPARISON:  CT the chest, abdomen and pelvis 09/05/2016. FINDINGS: CT CHEST FINDINGS Cardiovascular: Heart size is normal. There is no significant pericardial fluid, thickening or pericardial calcification. There is aortic  atherosclerosis, as well as atherosclerosis of the great vessels of the mediastinum and the coronary arteries, including calcified atherosclerotic plaque in the left main, left anterior descending, left circumflex and right coronary arteries. Calcifications and thickening of the aortic valve. Ectasia of the ascending thoracic aorta (4.0 cm in diameter). Mediastinum/Nodes: Borderline enlarged mediastinal lymph nodes measuring up to 9 mm in short axis in the prevascular and right paratracheal nodal stations. No hilar lymphadenopathy. Esophagus is unremarkable in appearance. No axillary lymphadenopathy. Lungs/Pleura: Status post left lower lobectomy. There is also suture line along the medial aspect of the left upper lobe near the apex, suggesting prior wedge resection. Stable nodular postoperative scarring in the inferior aspect of the left upper lobe near the lung base (axial image 113 of series 4). There is also some linear scarring in the medial segment of the right middle lobe. No definite suspicious appearing pulmonary nodules or masses are noted on today's examination. No acute consolidative airspace disease. No pleural effusions. Diffuse bronchial wall thickening with mild to moderate centrilobular and paraseptal emphysema. Musculoskeletal: There are no aggressive appearing lytic or blastic lesions noted in the visualized portions of the skeleton. CT ABDOMEN PELVIS FINDINGS Hepatobiliary: No cystic or solid hepatic lesions. No intra or extrahepatic biliary ductal dilatation. Status post cholecystectomy. Pancreas: No pancreatic mass. No pancreatic ductal dilatation. No pancreatic or peripancreatic fluid or inflammatory changes. Spleen: Unremarkable. Adrenals/Urinary Tract: Bilateral kidneys and bilateral adrenal glands are normal in appearance. No hydroureteronephrosis. Urinary bladder is normal in appearance. Stomach/Bowel: Normal appearance of the stomach. No pathologic dilatation of small bowel or colon.  Normal appendix. Vascular/Lymphatic: Aortic atherosclerosis, without evidence of aneurysm or dissection in the abdominal or pelvic vasculature. Moderate to severe narrowing of the distal infrarenal abdominal aorta which has a minimal luminal diameter of 7 x 5 mm (mean diameter 6 mm) shortly above the level of the aortic bifurcation. No lymphadenopathy noted in the abdomen or pelvis. Reproductive: Uterus and ovaries are atrophic. Other: No significant volume of ascites.  No pneumoperitoneum. Musculoskeletal: There are no aggressive appearing lytic or blastic lesions noted in the visualized portions of the skeleton. IMPRESSION: 1. Status post left lower lobectomy. No findings to suggest recurrent or metastatic disease in the chest, abdomen or pelvis. 2. Mild diffuse bronchial wall thickening with mild to moderate centrilobular and paraseptal emphysema; imaging findings suggestive of underlying COPD. 3. Aortic atherosclerosis, in addition to left main and 3 vessel coronary artery disease. Please note that although the presence of coronary artery calcium documents the presence of coronary artery disease, the severity of this disease and any potential stenosis cannot be assessed on this non-gated CT examination. Assessment for potential risk factor modification, dietary therapy or pharmacologic therapy may be warranted, if clinically indicated. 4. In addition, there is ectasia of the ascending thoracic aorta (4.0 cm in diameter). Recommend annual imaging followup by CTA or MRA. This recommendation follows  2010 ACCF/AHA/AATS/ACR/ASA/SCA/SCAI/SIR/STS/SVM Guidelines for the Diagnosis and Management of Patients with Thoracic Aortic Disease. Circulation. 2010; 121: P014-D030. 5. There are calcifications of the aortic valve. Echocardiographic correlation for evaluation of potential valvular dysfunction may be warranted if clinically indicated. 6. Additional incidental findings, as above Electronically Signed   By: Vinnie Langton M.D.   On: 01/24/2017 13:01    ASSESSMENT AND PLAN:  This is a very pleasant 56 years old white female with recurrent non-small cell lung cancer, adenocarcinoma with positive PDL 1 expression of 60%. The patient is currently on treatment with immunotherapy with Ketruda 200 mg IV every 3 weeks status post 31 cycles. She continues to tolerate her treatment fairly well with no significant adverse effects. I recommended for the patient to proceed with cycle #32 today as scheduled. I will see her back for follow-up visit in 3 weeks for reevaluation before the next dose of her treatment. For the hypothyroidism, I will give the patient refill for levothyroxine. She was advised to call immediately if she has any concerning symptoms in the interval. The patient voices understanding of current disease status and treatment options and is in agreement with the current care plan. All questions were answered. The patient knows to call the clinic with any problems, questions or concerns. We can certainly see the patient much sooner if necessary.  Disclaimer: This note was dictated with voice recognition software. Similar sounding words can inadvertently be transcribed and may not be corrected upon review.

## 2017-02-21 NOTE — Patient Instructions (Signed)
[facility] Discharge Instructions for Patients Receiving Chemotherapy  Today you received the following chemotherapy agents :  Keytruda.  To help prevent nausea and vomiting after your treatment, we encourage you to take your nausea medication as prescribed.   If you develop nausea and vomiting that is not controlled by your nausea medication, call the clinic.   BELOW ARE SYMPTOMS THAT SHOULD BE REPORTED IMMEDIATELY:  *FEVER GREATER THAN 100.5 F  *CHILLS WITH OR WITHOUT FEVER  NAUSEA AND VOMITING THAT IS NOT CONTROLLED WITH YOUR NAUSEA MEDICATION  *UNUSUAL SHORTNESS OF BREATH  *UNUSUAL BRUISING OR BLEEDING  TENDERNESS IN MOUTH AND THROAT WITH OR WITHOUT PRESENCE OF ULCERS  *URINARY PROBLEMS  *BOWEL PROBLEMS  UNUSUAL RASH Items with * indicate a potential emergency and should be followed up as soon as possible.  Feel free to call the clinic should you have any questions or concerns. The clinic phone number is [phone].  Please show the CHEMO ALERT CARD at check-in to the Emergency Department and triage nurse.  

## 2017-03-14 ENCOUNTER — Encounter: Payer: Self-pay | Admitting: Internal Medicine

## 2017-03-14 ENCOUNTER — Ambulatory Visit (HOSPITAL_BASED_OUTPATIENT_CLINIC_OR_DEPARTMENT_OTHER): Payer: BLUE CROSS/BLUE SHIELD

## 2017-03-14 ENCOUNTER — Telehealth: Payer: Self-pay | Admitting: Internal Medicine

## 2017-03-14 ENCOUNTER — Ambulatory Visit (HOSPITAL_BASED_OUTPATIENT_CLINIC_OR_DEPARTMENT_OTHER): Payer: BLUE CROSS/BLUE SHIELD | Admitting: Internal Medicine

## 2017-03-14 ENCOUNTER — Other Ambulatory Visit (HOSPITAL_BASED_OUTPATIENT_CLINIC_OR_DEPARTMENT_OTHER): Payer: BLUE CROSS/BLUE SHIELD

## 2017-03-14 VITALS — BP 120/66 | HR 78 | Temp 98.5°F | Resp 18 | Ht 64.0 in | Wt 145.9 lb

## 2017-03-14 DIAGNOSIS — Z5112 Encounter for antineoplastic immunotherapy: Secondary | ICD-10-CM | POA: Diagnosis not present

## 2017-03-14 DIAGNOSIS — R5382 Chronic fatigue, unspecified: Secondary | ICD-10-CM

## 2017-03-14 DIAGNOSIS — C3412 Malignant neoplasm of upper lobe, left bronchus or lung: Secondary | ICD-10-CM

## 2017-03-14 DIAGNOSIS — C3432 Malignant neoplasm of lower lobe, left bronchus or lung: Secondary | ICD-10-CM

## 2017-03-14 DIAGNOSIS — E039 Hypothyroidism, unspecified: Secondary | ICD-10-CM | POA: Diagnosis not present

## 2017-03-14 DIAGNOSIS — R42 Dizziness and giddiness: Secondary | ICD-10-CM

## 2017-03-14 LAB — COMPREHENSIVE METABOLIC PANEL
ALT: 16 U/L (ref 0–55)
ANION GAP: 11 meq/L (ref 3–11)
AST: 23 U/L (ref 5–34)
Albumin: 3.5 g/dL (ref 3.5–5.0)
Alkaline Phosphatase: 95 U/L (ref 40–150)
BUN: 10.5 mg/dL (ref 7.0–26.0)
CHLORIDE: 107 meq/L (ref 98–109)
CO2: 23 meq/L (ref 22–29)
Calcium: 9.5 mg/dL (ref 8.4–10.4)
Creatinine: 0.7 mg/dL (ref 0.6–1.1)
GLUCOSE: 124 mg/dL (ref 70–140)
POTASSIUM: 3.6 meq/L (ref 3.5–5.1)
SODIUM: 141 meq/L (ref 136–145)
TOTAL PROTEIN: 7.4 g/dL (ref 6.4–8.3)
Total Bilirubin: 0.43 mg/dL (ref 0.20–1.20)

## 2017-03-14 LAB — CBC WITH DIFFERENTIAL/PLATELET
BASO%: 0.5 % (ref 0.0–2.0)
Basophils Absolute: 0 10*3/uL (ref 0.0–0.1)
EOS%: 1.9 % (ref 0.0–7.0)
Eosinophils Absolute: 0.1 10*3/uL (ref 0.0–0.5)
HEMATOCRIT: 46.4 % (ref 34.8–46.6)
HEMOGLOBIN: 15.9 g/dL (ref 11.6–15.9)
LYMPH#: 2.2 10*3/uL (ref 0.9–3.3)
LYMPH%: 30.7 % (ref 14.0–49.7)
MCH: 33.2 pg (ref 25.1–34.0)
MCHC: 34.3 g/dL (ref 31.5–36.0)
MCV: 96.9 fL (ref 79.5–101.0)
MONO#: 0.6 10*3/uL (ref 0.1–0.9)
MONO%: 7.7 % (ref 0.0–14.0)
NEUT%: 59.2 % (ref 38.4–76.8)
NEUTROS ABS: 4.3 10*3/uL (ref 1.5–6.5)
Platelets: 204 10*3/uL (ref 145–400)
RBC: 4.79 10*6/uL (ref 3.70–5.45)
RDW: 13.4 % (ref 11.2–14.5)
WBC: 7.3 10*3/uL (ref 3.9–10.3)

## 2017-03-14 LAB — TSH

## 2017-03-14 MED ORDER — SODIUM CHLORIDE 0.9 % IV SOLN
Freq: Once | INTRAVENOUS | Status: AC
  Administered 2017-03-14: 12:00:00 via INTRAVENOUS

## 2017-03-14 MED ORDER — SODIUM CHLORIDE 0.9 % IV SOLN
200.0000 mg | Freq: Once | INTRAVENOUS | Status: AC
  Administered 2017-03-14: 200 mg via INTRAVENOUS
  Filled 2017-03-14: qty 8

## 2017-03-14 NOTE — Telephone Encounter (Signed)
Scheduled appt per 10/16 los - patient to get an updated schedule next visit.

## 2017-03-14 NOTE — Progress Notes (Signed)
[facility] [phone]   Fax:(336) [place], NP [place] [address]  DIAGNOSIS: Recurrent non-small cell lung cancer, adenocarcinoma with PDL 1 expression of 60% diagnosed in November 2016. The patient was initially diagnosed as stage IB (T2a, N0, M0) adenocarcinoma in March 2016.  PRIOR THERAPY:  Status post left video-assisted thoracoscopy, wedge resection left lower lobe, thoracoscopic left lower lobectomy and mediastinal lymph node dissection on 08/14/2014.   CURRENT THERAPY: Immunotherapy with Ketruda (pembrolizumab) 200 mg IV every 3 weeks status post 32 cycles. First dose was given 05/12/2015.   INTERVAL HISTORY: Sharon [name] 56 y.o. female returns to the clinic today for follow-up visit accompanied by her daughter. The patient is feeling fine today with no specific complaints. She continues to tolerate her treatment with the tube fairly well. She has occasional dizzy spells in the last 2 days. She denied having any fever or chills. She has no nausea, vomiting, diarrhea or constipation. She has no chest pain, shortness breath, cough or hemoptysis. She denied having any weight loss or night sweats. She is here today for evaluation before starting cycle #33.   MEDICAL HISTORY: Past Medical History:  Diagnosis Date  . Aortic insufficiency    Echo 8/09:  EF 55-60, impaired relaxation, mild aortic stenosis (mean 8.1), moderate aortic insufficiency, trace MR, trace TR // Echo 4/18: mild LVH, EF 55-60, no RWMA, Gr 1 DD, mod AI   . Arthritis   . Carotid artery disease ([facility]) 03/09/2011   Carotid US 8/15 - R 40-59; L1-39 >> follow-up 1 year // Carotid US 4/18: ICA 1-39 bilaterally - repeat 1 yr  . COPD (chronic obstructive pulmonary disease) ([facility])   . Coronary artery disease involving native coronary artery of native heart without angina pectoris 11/24/2009   s/p ant STEMI 5/09 >> LHC 5/09 - LM normal;  LAD proximal 99; branching DX with one branch occluded and the other severely diseased; LCx okay with irregularities in the OM1; RCA proximal occluded-CTO; EF 30-40, anterior wall HK  >>  PCI: 3 x 18 mm Promus DES, 2.5 x 15 mm Promus DES to the LAD  . Emphysema of lung ([facility])   . Headache 09/06/2016  . History of acute anterior wall MI 12/2007   s/p DES x 2 to LAD  . Hyperlipidemia   . Ischemic cardiomyopathy   . Non-small cell lung cancer ([facility])    per CT CHEST/PET 2/4 and 07/18/14 @ [facility]  . Pneumonia    2015   HX BRONCHITIS    . Tobacco user     ALLERGIES:  is allergic to codeine; lipitor [atorvastatin calcium]; morphine and related; and oxycodone-acetaminophen.  MEDICATIONS:  Current Outpatient Prescriptions  Medication Sig Dispense Refill  . aspirin (ASPIR-LOW) 81 MG EC tablet Take 81 mg by mouth daily. On hold    . clopidogrel (PLAVIX) 75 MG tablet TAKE 1 TABLET BY MOUTH EVERY DAY 90 tablet 2  . levothyroxine (SYNTHROID, LEVOTHROID) 125 MCG tablet Take 1 tablet (125 mcg total) by mouth daily before breakfast. 90 tablet 0  . Magnesium 250 MG TABS Take 250 mg by mouth daily.    . ondansetron (ZOFRAN) 8 MG tablet TAKE 1 TABLET (8 MG TOTAL) BY MOUTH EVERY 8 (EIGHT) HOURS AS NEEDED FOR NAUSEA OR VOMITING. 30 tablet 0  . Pembrolizumab (KEYTRUDA IV) TAKE KEYTRUDA  IV INFUSION ONCE EVERY 3 WEEKS FOR CHEMO( PT. NOT SURE OF  DOSAGE)    . potassium chloride (MICRO-K) 10 MEQ CR capsule Take 10 mEq by mouth 2 (two) times daily.  0  . traMADol (ULTRAM) 50 MG tablet Take 50 mg by mouth every 6 (six) hours as needed for moderate pain or severe pain.    . rosuvastatin (CRESTOR) 40 MG tablet Take 1 tablet (40 mg total) by mouth daily. 90 tablet 3   No current facility-administered medications for this visit.     SURGICAL HISTORY:  Past Surgical History:  Procedure Laterality Date  . CARDIAC CATHETERIZATION  10/15/2007   EF 30-40%  . CESAREAN SECTION    . CHOLECYSTECTOMY    . CORONARY  ANGIOPLASTY WITH STENT PLACEMENT     LAD  . ectopic pregnancies requiring laparotomies    . LEAD REMOVAL Left 08/14/2014   Procedure: CRYO INTERCOSTAL NERVE BLOCK;  Surgeon: Melrose Nakayama, MD;  Location: [facility];  Service: Thoracic;  Laterality: Left;  . LOBECTOMY Left 08/14/2014   Procedure: LEFT LOWER LOBECTOMY;  Surgeon: Melrose Nakayama, MD;  Location: [facility];  Service: Thoracic;  Laterality: Left;  . NODE DISSECTION Left 08/14/2014   Procedure: NODE DISSECTION, LEFT LOWER LOBE LUNG;  Surgeon: Melrose Nakayama, MD;  Location: [facility];  Service: Thoracic;  Laterality: Left;  . SHOULDER SURGERY     LEFT X 2   (REMOVED SOME COLLAR BONE )  . TRANSTHORACIC ECHOCARDIOGRAM  10/17/2007  . TUBAL LIGATION    . US ECHOCARDIOGRAPHY  01/07/2008   EF 55-60%  . VIDEO ASSISTED THORACOSCOPY (VATS)/WEDGE RESECTION Left 08/14/2014   Procedure: VIDEO ASSISTED THORACOSCOPY (VATS)/WEDGE RESECTION;  Surgeon: Melrose Nakayama, MD;  Location: [facility];  Service: Thoracic;  Laterality: Left;    REVIEW OF SYSTEMS:  A comprehensive review of systems was negative except for: Neurological: positive for dizziness   PHYSICAL EXAMINATION: General appearance: alert, cooperative and no distress Head: Normocephalic, without obvious abnormality, atraumatic Neck: no adenopathy, no JVD, supple, symmetrical, trachea midline and thyroid not enlarged, symmetric, no tenderness/mass/nodules Lymph nodes: Cervical, supraclavicular, and axillary nodes normal. Resp: clear to auscultation bilaterally Back: negative Cardio: regular rate and rhythm, S1, S2 normal, no murmur, click, rub or gallop GI: soft, non-tender; bowel sounds normal; no masses,  no organomegaly Extremities: extremities normal, atraumatic, no cyanosis or edema  ECOG PERFORMANCE STATUS: 1 - Symptomatic but completely ambulatory  Blood pressure 120/66, pulse 78, temperature 98.5 F (36.9 C), temperature source Oral, resp. rate 18, height 5\' 4"  (1.626 m),  weight 145 lb 14.4 oz (66.2 kg), SpO2 97 %.  LABORATORY DATA: Lab Results  Component Value Date   WBC 7.3 03/14/2017   HGB 15.9 03/14/2017   HCT 46.4 03/14/2017   MCV 96.9 03/14/2017   PLT 204 03/14/2017      Chemistry      Component Value Date/Time   NA 141 03/14/2017 0956   K 3.6 03/14/2017 0956   CL 109 08/16/2014 0520   CO2 23 03/14/2017 0956   BUN 10.5 03/14/2017 0956   CREATININE 0.7 03/14/2017 0956      Component Value Date/Time   CALCIUM 9.5 03/14/2017 0956   ALKPHOS 95 03/14/2017 0956   AST 23 03/14/2017 0956   ALT 16 03/14/2017 0956   BILITOT 0.43 03/14/2017 0956       RADIOGRAPHIC STUDIES: No results found.  ASSESSMENT AND PLAN:  This is a very pleasant 56 years old white female with recurrent non-small cell lung cancer, adenocarcinoma with positive PDL 1 expression of 60%. The  patient is currently on treatment with immunotherapy with Ketruda 200 mg IV every 3 weeks status post 32 cycles. The patient tolerated the last cycle of her treatment well with no significant complaints. I recommended for her to proceed with cycle #33 today as scheduled. I will see her back for follow-up visit in 3 weeks for evaluation before the next cycle of her treatment. For the intermittent dizzy spells, we'll continue to monitor for now and if it's getting worse I would consider the patient for repeat MRI of the brain to rule out any metastatic disease to the brain. For the hypothyroidism, I will give the patient refill for levothyroxine. The patient was advised to call immediately if she has any concerning symptoms in the interval. The patient voices understanding of current disease status and treatment options and is in agreement with the current care plan. All questions were answered. The patient knows to call the clinic with any problems, questions or concerns. We can certainly see the patient much sooner if necessary.  Disclaimer: This note was dictated with voice  recognition software. Similar sounding words can inadvertently be transcribed and may not be corrected upon review.

## 2017-03-14 NOTE — Patient Instructions (Signed)
[facility] Discharge Instructions for Patients Receiving Chemotherapy  Today you received the following chemotherapy agents :  Keytruda.  To help prevent nausea and vomiting after your treatment, we encourage you to take your nausea medication as prescribed.   If you develop nausea and vomiting that is not controlled by your nausea medication, call the clinic.   BELOW ARE SYMPTOMS THAT SHOULD BE REPORTED IMMEDIATELY:  *FEVER GREATER THAN 100.5 F  *CHILLS WITH OR WITHOUT FEVER  NAUSEA AND VOMITING THAT IS NOT CONTROLLED WITH YOUR NAUSEA MEDICATION  *UNUSUAL SHORTNESS OF BREATH  *UNUSUAL BRUISING OR BLEEDING  TENDERNESS IN MOUTH AND THROAT WITH OR WITHOUT PRESENCE OF ULCERS  *URINARY PROBLEMS  *BOWEL PROBLEMS  UNUSUAL RASH Items with * indicate a potential emergency and should be followed up as soon as possible.  Feel free to call the clinic should you have any questions or concerns. The clinic phone number is [phone].  Please show the CHEMO ALERT CARD at check-in to the Emergency Department and triage nurse.  

## 2017-04-04 ENCOUNTER — Encounter: Payer: Self-pay | Admitting: Internal Medicine

## 2017-04-04 ENCOUNTER — Ambulatory Visit (HOSPITAL_BASED_OUTPATIENT_CLINIC_OR_DEPARTMENT_OTHER): Payer: BLUE CROSS/BLUE SHIELD

## 2017-04-04 ENCOUNTER — Ambulatory Visit (HOSPITAL_BASED_OUTPATIENT_CLINIC_OR_DEPARTMENT_OTHER): Payer: BLUE CROSS/BLUE SHIELD | Admitting: Internal Medicine

## 2017-04-04 ENCOUNTER — Other Ambulatory Visit (HOSPITAL_BASED_OUTPATIENT_CLINIC_OR_DEPARTMENT_OTHER): Payer: BLUE CROSS/BLUE SHIELD

## 2017-04-04 VITALS — BP 118/64 | HR 84 | Temp 98.8°F | Resp 18 | Ht 64.0 in | Wt 145.2 lb

## 2017-04-04 DIAGNOSIS — C3432 Malignant neoplasm of lower lobe, left bronchus or lung: Secondary | ICD-10-CM

## 2017-04-04 DIAGNOSIS — E039 Hypothyroidism, unspecified: Secondary | ICD-10-CM | POA: Diagnosis not present

## 2017-04-04 DIAGNOSIS — C3412 Malignant neoplasm of upper lobe, left bronchus or lung: Secondary | ICD-10-CM

## 2017-04-04 DIAGNOSIS — Z5112 Encounter for antineoplastic immunotherapy: Secondary | ICD-10-CM

## 2017-04-04 DIAGNOSIS — R5382 Chronic fatigue, unspecified: Secondary | ICD-10-CM

## 2017-04-04 LAB — CBC WITH DIFFERENTIAL/PLATELET
BASO%: 0.3 % (ref 0.0–2.0)
Basophils Absolute: 0 10e3/uL (ref 0.0–0.1)
EOS%: 2 % (ref 0.0–7.0)
Eosinophils Absolute: 0.1 10e3/uL (ref 0.0–0.5)
HCT: 46.6 % (ref 34.8–46.6)
HGB: 15.8 g/dL (ref 11.6–15.9)
LYMPH%: 34.9 % (ref 14.0–49.7)
MCH: 32.8 pg (ref 25.1–34.0)
MCHC: 33.9 g/dL (ref 31.5–36.0)
MCV: 96.7 fL (ref 79.5–101.0)
MONO#: 0.5 10e3/uL (ref 0.1–0.9)
MONO%: 7.1 % (ref 0.0–14.0)
NEUT#: 3.6 10e3/uL (ref 1.5–6.5)
NEUT%: 55.7 % (ref 38.4–76.8)
Platelets: 207 10e3/uL (ref 145–400)
RBC: 4.82 10e6/uL (ref 3.70–5.45)
RDW: 13.5 % (ref 11.2–14.5)
WBC: 6.5 10e3/uL (ref 3.9–10.3)
lymph#: 2.3 10e3/uL (ref 0.9–3.3)

## 2017-04-04 LAB — COMPREHENSIVE METABOLIC PANEL
ALBUMIN: 3.4 g/dL — AB (ref 3.5–5.0)
ALK PHOS: 91 U/L (ref 40–150)
ALT: 15 U/L (ref 0–55)
ANION GAP: 8 meq/L (ref 3–11)
AST: 20 U/L (ref 5–34)
BILIRUBIN TOTAL: 0.4 mg/dL (ref 0.20–1.20)
BUN: 7.1 mg/dL (ref 7.0–26.0)
CALCIUM: 9.6 mg/dL (ref 8.4–10.4)
CO2: 25 meq/L (ref 22–29)
Chloride: 107 mEq/L (ref 98–109)
Creatinine: 0.7 mg/dL (ref 0.6–1.1)
Glucose: 97 mg/dl (ref 70–140)
Potassium: 3.8 mEq/L (ref 3.5–5.1)
Sodium: 140 mEq/L (ref 136–145)
TOTAL PROTEIN: 7.3 g/dL (ref 6.4–8.3)

## 2017-04-04 LAB — TSH: TSH: <0.08 m[IU]/L — ABNORMAL LOW (ref 0.308–3.960)

## 2017-04-04 MED ORDER — SODIUM CHLORIDE 0.9 % IV SOLN
Freq: Once | INTRAVENOUS | Status: AC
  Administered 2017-04-04: 12:00:00 via INTRAVENOUS

## 2017-04-04 MED ORDER — SODIUM CHLORIDE 0.9 % IV SOLN
200.0000 mg | Freq: Once | INTRAVENOUS | Status: AC
  Administered 2017-04-04: 200 mg via INTRAVENOUS
  Filled 2017-04-04: qty 8

## 2017-04-04 NOTE — Patient Instructions (Signed)
[facility] Discharge Instructions for Patients Receiving Chemotherapy  Today you received the following chemotherapy agents: Pembrolizumab Beryle Flock)  To help prevent nausea and vomiting after your treatment, we encourage you to take your nausea medication as prescribed. If you develop nausea and vomiting that is not controlled by your nausea medication, call the clinic.   BELOW ARE SYMPTOMS THAT SHOULD BE REPORTED IMMEDIATELY:  *FEVER GREATER THAN 100.5 F  *CHILLS WITH OR WITHOUT FEVER  NAUSEA AND VOMITING THAT IS NOT CONTROLLED WITH YOUR NAUSEA MEDICATION  *UNUSUAL SHORTNESS OF BREATH  *UNUSUAL BRUISING OR BLEEDING  TENDERNESS IN MOUTH AND THROAT WITH OR WITHOUT PRESENCE OF ULCERS  *URINARY PROBLEMS  *BOWEL PROBLEMS  UNUSUAL RASH Items with * indicate a potential emergency and should be followed up as soon as possible.  Feel free to call the clinic should you have any questions or concerns. The clinic phone number is [phone].  Please show the [facility] at check-in to the Emergency Department and triage nurse.

## 2017-04-04 NOTE — Progress Notes (Signed)
[facility] [phone]   Fax:(336) [place], NP [place] [address]  DIAGNOSIS: Recurrent non-small cell lung cancer, adenocarcinoma with PDL 1 expression of 60% diagnosed in November 2016. The patient was initially diagnosed as stage IB (T2a, N0, M0) adenocarcinoma in March 2016.  PRIOR THERAPY:  Status post left video-assisted thoracoscopy, wedge resection left lower lobe, thoracoscopic left lower lobectomy and mediastinal lymph node dissection on 08/14/2014.   CURRENT THERAPY: Immunotherapy with Ketruda (pembrolizumab) 200 mg IV every 3 weeks status post 33 cycles. First dose was given 05/12/2015.   INTERVAL HISTORY: Sharon [name] 56 y.o. female returns to the clinic today for follow-up visit.  The patient is feeling fine today with no specific complaints.  She denied having any chest pain, shortness of breath, cough or hemoptysis.  She denied having any fever or chills.  She has no nausea, vomiting, diarrhea or constipation.  She continues to tolerate her treatment with Keytruda fairly well.  She is here today for evaluation before starting cycle #34.   MEDICAL HISTORY: Past Medical History:  Diagnosis Date  . Aortic insufficiency    Echo 8/09:  EF 55-60, impaired relaxation, mild aortic stenosis (mean 8.1), moderate aortic insufficiency, trace MR, trace TR // Echo 4/18: mild LVH, EF 55-60, no RWMA, Gr 1 DD, mod AI   . Arthritis   . Carotid artery disease ([facility]) 03/09/2011   Carotid US 8/15 - R 40-59; L1-39 >> follow-up 1 year // Carotid US 4/18: ICA 1-39 bilaterally - repeat 1 yr  . COPD (chronic obstructive pulmonary disease) ([facility])   . Coronary artery disease involving native coronary artery of native heart without angina pectoris 11/24/2009   s/p ant STEMI 5/09 >> LHC 5/09 - LM normal; LAD proximal 99; branching DX with one branch occluded and the other severely diseased; LCx okay with  irregularities in the OM1; RCA proximal occluded-CTO; EF 30-40, anterior wall HK  >>  PCI: 3 x 18 mm Promus DES, 2.5 x 15 mm Promus DES to the LAD  . Emphysema of lung ([facility])   . Headache 09/06/2016  . History of acute anterior wall MI 12/2007   s/p DES x 2 to LAD  . Hyperlipidemia   . Ischemic cardiomyopathy   . Non-small cell lung cancer ([facility])    per CT CHEST/PET 2/4 and 07/18/14 @ [facility]  . Pneumonia    2015   HX BRONCHITIS    . Tobacco user     ALLERGIES:  is allergic to codeine; lipitor [atorvastatin calcium]; morphine and related; and oxycodone-acetaminophen.  MEDICATIONS:  Current Outpatient Medications  Medication Sig Dispense Refill  . aspirin (ASPIR-LOW) 81 MG EC tablet Take 81 mg by mouth daily. On hold    . clopidogrel (PLAVIX) 75 MG tablet TAKE 1 TABLET BY MOUTH EVERY DAY 90 tablet 2  . levothyroxine (SYNTHROID, LEVOTHROID) 125 MCG tablet Take 1 tablet (125 mcg total) by mouth daily before breakfast. 90 tablet 0  . Magnesium 250 MG TABS Take 250 mg by mouth daily.    . ondansetron (ZOFRAN) 8 MG tablet TAKE 1 TABLET (8 MG TOTAL) BY MOUTH EVERY 8 (EIGHT) HOURS AS NEEDED FOR NAUSEA OR VOMITING. 30 tablet 0  . Pembrolizumab (KEYTRUDA IV) TAKE KEYTRUDA  IV INFUSION ONCE EVERY 3 WEEKS FOR CHEMO( PT. NOT SURE OF DOSAGE)    . potassium chloride (MICRO-K) 10 MEQ CR capsule Take 10 mEq by  mouth 2 (two) times daily.  0  . traMADol (ULTRAM) 50 MG tablet Take 50 mg by mouth every 6 (six) hours as needed for moderate pain or severe pain.    . rosuvastatin (CRESTOR) 40 MG tablet Take 1 tablet (40 mg total) by mouth daily. 90 tablet 3   No current facility-administered medications for this visit.     SURGICAL HISTORY:  Past Surgical History:  Procedure Laterality Date  . CARDIAC CATHETERIZATION  10/15/2007   EF 30-40%  . CESAREAN SECTION    . CHOLECYSTECTOMY    . CORONARY ANGIOPLASTY WITH STENT PLACEMENT     LAD  . ectopic pregnancies requiring laparotomies    . SHOULDER SURGERY      LEFT X 2   (REMOVED SOME COLLAR BONE )  . TRANSTHORACIC ECHOCARDIOGRAM  10/17/2007  . TUBAL LIGATION    . US ECHOCARDIOGRAPHY  01/07/2008   EF 55-60%    REVIEW OF SYSTEMS:  A comprehensive review of systems was negative.   PHYSICAL EXAMINATION: General appearance: alert, cooperative and no distress Head: Normocephalic, without obvious abnormality, atraumatic Neck: no adenopathy, no JVD, supple, symmetrical, trachea midline and thyroid not enlarged, symmetric, no tenderness/mass/nodules Lymph nodes: Cervical, supraclavicular, and axillary nodes normal. Resp: clear to auscultation bilaterally Back: negative Cardio: regular rate and rhythm, S1, S2 normal, no murmur, click, rub or gallop GI: soft, non-tender; bowel sounds normal; no masses,  no organomegaly Extremities: extremities normal, atraumatic, no cyanosis or edema  ECOG PERFORMANCE STATUS: 1 - Symptomatic but completely ambulatory  Blood pressure 118/64, pulse 84, temperature 98.8 F (37.1 C), temperature source Oral, resp. rate 18, height 5\' 4"  (1.626 m), weight 145 lb 3.2 oz (65.9 kg), SpO2 97 %.  LABORATORY DATA: Lab Results  Component Value Date   WBC 6.5 04/04/2017   HGB 15.8 04/04/2017   HCT 46.6 04/04/2017   MCV 96.7 04/04/2017   PLT 207 04/04/2017      Chemistry      Component Value Date/Time   NA 140 04/04/2017 0923   K 3.8 04/04/2017 0923   CL 109 08/16/2014 0520   CO2 25 04/04/2017 0923   BUN 7.1 04/04/2017 0923   CREATININE 0.7 04/04/2017 0923      Component Value Date/Time   CALCIUM 9.6 04/04/2017 0923   ALKPHOS 91 04/04/2017 0923   AST 20 04/04/2017 0923   ALT 15 04/04/2017 0923   BILITOT 0.40 04/04/2017 0923       RADIOGRAPHIC STUDIES: No results found.  ASSESSMENT AND PLAN:  This is a very pleasant 56 years old white female with recurrent non-small cell lung cancer, adenocarcinoma with positive PDL 1 expression of 60%. The patient is currently on treatment with immunotherapy with  Ketruda 200 mg IV every 3 weeks status post 33 cycles. The patient continues to tolerate her treatment fairly well with no significant adverse effects. I recommended for her to proceed with cycle #34 today as a schedule. I will see her back for follow-up visit in 3 weeks for evaluation before starting the last cycle of this course of treatment. She was advised to call immediately if she has any concerning symptoms in the interval. For the hypothyroidism, I will give the patient refill for levothyroxine. The patient voices understanding of current disease status and treatment options and is in agreement with the current care plan. All questions were answered. The patient knows to call the clinic with any problems, questions or concerns. We can certainly see the patient much sooner if necessary.  Disclaimer: This note was dictated with voice recognition software. Similar sounding words can inadvertently be transcribed and may not be corrected upon review.

## 2017-04-25 ENCOUNTER — Telehealth: Payer: Self-pay | Admitting: Internal Medicine

## 2017-04-25 ENCOUNTER — Ambulatory Visit (HOSPITAL_BASED_OUTPATIENT_CLINIC_OR_DEPARTMENT_OTHER): Payer: BLUE CROSS/BLUE SHIELD

## 2017-04-25 ENCOUNTER — Encounter: Payer: Self-pay | Admitting: Internal Medicine

## 2017-04-25 ENCOUNTER — Encounter: Payer: Self-pay | Admitting: *Deleted

## 2017-04-25 ENCOUNTER — Ambulatory Visit (HOSPITAL_BASED_OUTPATIENT_CLINIC_OR_DEPARTMENT_OTHER): Payer: BLUE CROSS/BLUE SHIELD | Admitting: Internal Medicine

## 2017-04-25 ENCOUNTER — Other Ambulatory Visit (HOSPITAL_BASED_OUTPATIENT_CLINIC_OR_DEPARTMENT_OTHER): Payer: BLUE CROSS/BLUE SHIELD

## 2017-04-25 VITALS — BP 122/69 | HR 74 | Temp 98.3°F | Resp 20 | Ht 64.0 in | Wt 143.3 lb

## 2017-04-25 DIAGNOSIS — Z5112 Encounter for antineoplastic immunotherapy: Secondary | ICD-10-CM

## 2017-04-25 DIAGNOSIS — C3432 Malignant neoplasm of lower lobe, left bronchus or lung: Secondary | ICD-10-CM | POA: Diagnosis not present

## 2017-04-25 DIAGNOSIS — C3412 Malignant neoplasm of upper lobe, left bronchus or lung: Secondary | ICD-10-CM

## 2017-04-25 DIAGNOSIS — Z720 Tobacco use: Secondary | ICD-10-CM

## 2017-04-25 DIAGNOSIS — C3490 Malignant neoplasm of unspecified part of unspecified bronchus or lung: Secondary | ICD-10-CM

## 2017-04-25 DIAGNOSIS — E039 Hypothyroidism, unspecified: Secondary | ICD-10-CM

## 2017-04-25 LAB — COMPREHENSIVE METABOLIC PANEL
ALT: 14 U/L (ref 0–55)
ANION GAP: 10 meq/L (ref 3–11)
AST: 20 U/L (ref 5–34)
Albumin: 3.5 g/dL (ref 3.5–5.0)
Alkaline Phosphatase: 91 U/L (ref 40–150)
BILIRUBIN TOTAL: 0.46 mg/dL (ref 0.20–1.20)
BUN: 7.3 mg/dL (ref 7.0–26.0)
CO2: 19 meq/L — AB (ref 22–29)
CREATININE: 0.7 mg/dL (ref 0.6–1.1)
Calcium: 9.6 mg/dL (ref 8.4–10.4)
Chloride: 110 mEq/L — ABNORMAL HIGH (ref 98–109)
EGFR: >60 mL/min/{1.73_m2} (ref >60)
GLUCOSE: 101 mg/dL (ref 70–140)
Potassium: 3.7 mEq/L (ref 3.5–5.1)
Sodium: 139 mEq/L (ref 136–145)
TOTAL PROTEIN: 7.4 g/dL (ref 6.4–8.3)

## 2017-04-25 LAB — CBC WITH DIFFERENTIAL/PLATELET
BASO%: 0.3 % (ref 0.0–2.0)
BASOS ABS: 0 10*3/uL (ref 0.0–0.1)
EOS%: 1.5 % (ref 0.0–7.0)
Eosinophils Absolute: 0.1 10*3/uL (ref 0.0–0.5)
HCT: 46.5 % (ref 34.8–46.6)
HGB: 15.7 g/dL (ref 11.6–15.9)
LYMPH%: 31.9 % (ref 14.0–49.7)
MCH: 32.5 pg (ref 25.1–34.0)
MCHC: 33.8 g/dL (ref 31.5–36.0)
MCV: 96.3 fL (ref 79.5–101.0)
MONO#: 0.5 10*3/uL (ref 0.1–0.9)
MONO%: 7 % (ref 0.0–14.0)
NEUT#: 3.9 10*3/uL (ref 1.5–6.5)
NEUT%: 59.3 % (ref 38.4–76.8)
Platelets: 213 10*3/uL (ref 145–400)
RBC: 4.83 10*6/uL (ref 3.70–5.45)
RDW: 13.9 % (ref 11.2–14.5)
WBC: 6.6 10*3/uL (ref 3.9–10.3)
lymph#: 2.1 10*3/uL (ref 0.9–3.3)

## 2017-04-25 MED ORDER — SODIUM CHLORIDE 0.9 % IV SOLN
200.0000 mg | Freq: Once | INTRAVENOUS | Status: AC
  Administered 2017-04-25: 200 mg via INTRAVENOUS
  Filled 2017-04-25: qty 8

## 2017-04-25 MED ORDER — SODIUM CHLORIDE 0.9 % IV SOLN
Freq: Once | INTRAVENOUS | Status: AC
  Administered 2017-04-25: 11:00:00 via INTRAVENOUS

## 2017-04-25 NOTE — Progress Notes (Signed)
[name] [phone]   Fax:([phone]) [place], NP [place] [address]  DIAGNOSIS: Recurrent non-small cell lung cancer, adenocarcinoma with PDL 1 expression of 60% diagnosed in November 2016. The patient was initially diagnosed as stage IB (T2a, N0, M0) adenocarcinoma in March 2016.  PRIOR THERAPY:  Status post left video-assisted thoracoscopy, wedge resection left lower lobe, thoracoscopic left lower lobectomy and mediastinal lymph node dissection on 08/14/2014.   CURRENT THERAPY: Immunotherapy with Ketruda (pembrolizumab) 200 mg IV every 3 weeks status post 34 cycles. First dose was given 05/12/2015.   INTERVAL HISTORY: Sharon [name] 56 y.o. female returns to the clinic today for follow-up visit accompanied by her daughter.  The patient is feeling fine today with no specific complaints.  She denied having any chest pain, shortness of breath, cough or hemoptysis.  She denied having any weight loss or night sweats.  She has no nausea, vomiting, diarrhea or constipation.  She denied having any fever or chills.  Unfortunately she continues to smoke at regular basis.  I strongly advised her to quit smoking.  She is here today for evaluation before starting cycle #35 her treatment with Keytruda.   MEDICAL HISTORY: Past Medical History:  Diagnosis Date  . Aortic insufficiency    Echo 8/09:  EF 55-60, impaired relaxation, mild aortic stenosis (mean 8.1), moderate aortic insufficiency, trace MR, trace TR // Echo 4/18: mild LVH, EF 55-60, no RWMA, Gr 1 DD, mod AI   . Arthritis   . Carotid artery disease ([place]) 03/09/2011   Carotid US 8/15 - R 40-59; L1-39 >> follow-up 1 year // Carotid US 4/18: ICA 1-39 bilaterally - repeat 1 yr  . COPD (chronic obstructive pulmonary disease) ([place])   . Coronary artery disease involving native coronary artery of native heart without angina pectoris 11/24/2009   s/p ant STEMI 5/09  >> LHC 5/09 - LM normal; LAD proximal 99; branching DX with one branch occluded and the other severely diseased; LCx okay with irregularities in the OM1; RCA proximal occluded-CTO; EF 30-40, anterior wall HK  >>  PCI: 3 x 18 mm Promus DES, 2.5 x 15 mm Promus DES to the LAD  . Emphysema of lung ([place])   . Headache 09/06/2016  . History of acute anterior wall MI 12/2007   s/p DES x 2 to LAD  . Hyperlipidemia   . Ischemic cardiomyopathy   . Non-small cell lung cancer ([place])    per CT CHEST/PET 2/4 and 07/18/14 @ [facility]  . Pneumonia    2015   HX BRONCHITIS    . Tobacco user     ALLERGIES:  is allergic to codeine; lipitor [atorvastatin calcium]; morphine and related; and oxycodone-acetaminophen.  MEDICATIONS:  Current Outpatient Medications  Medication Sig Dispense Refill  . aspirin (ASPIR-LOW) 81 MG EC tablet Take 81 mg by mouth daily. On hold    . clopidogrel (PLAVIX) 75 MG tablet TAKE 1 TABLET BY MOUTH EVERY DAY 90 tablet 2  . levothyroxine (SYNTHROID, LEVOTHROID) 125 MCG tablet Take 1 tablet (125 mcg total) by mouth daily before breakfast. 90 tablet 0  . Magnesium 250 MG TABS Take 250 mg by mouth daily.    . ondansetron (ZOFRAN) 8 MG tablet TAKE 1 TABLET (8 MG TOTAL) BY MOUTH EVERY 8 (EIGHT) HOURS AS NEEDED FOR NAUSEA OR VOMITING. 30 tablet 0  . Pembrolizumab (KEYTRUDA IV) TAKE KEYTRUDA  IV INFUSION ONCE EVERY  3 WEEKS FOR CHEMO( PT. NOT SURE OF DOSAGE)    . potassium chloride (MICRO-K) 10 MEQ CR capsule Take 10 mEq by mouth 2 (two) times daily.  0  . traMADol (ULTRAM) 50 MG tablet Take 50 mg by mouth every 6 (six) hours as needed for moderate pain or severe pain.    . rosuvastatin (CRESTOR) 40 MG tablet Take 1 tablet (40 mg total) by mouth daily. 90 tablet 3   No current facility-administered medications for this visit.     SURGICAL HISTORY:  Past Surgical History:  Procedure Laterality Date  . CARDIAC CATHETERIZATION  10/15/2007   EF 30-40%  . CESAREAN SECTION    . CHOLECYSTECTOMY     . CORONARY ANGIOPLASTY WITH STENT PLACEMENT     LAD  . ectopic pregnancies requiring laparotomies    . LEAD REMOVAL Left 08/14/2014   Procedure: CRYO INTERCOSTAL NERVE BLOCK;  Surgeon: Melrose Nakayama, MD;  Location: [facility];  Service: Thoracic;  Laterality: Left;  . LOBECTOMY Left 08/14/2014   Procedure: LEFT LOWER LOBECTOMY;  Surgeon: Melrose Nakayama, MD;  Location: [facility];  Service: Thoracic;  Laterality: Left;  . NODE DISSECTION Left 08/14/2014   Procedure: NODE DISSECTION, LEFT LOWER LOBE LUNG;  Surgeon: Melrose Nakayama, MD;  Location: [facility];  Service: Thoracic;  Laterality: Left;  . SHOULDER SURGERY     LEFT X 2   (REMOVED SOME COLLAR BONE )  . TRANSTHORACIC ECHOCARDIOGRAM  10/17/2007  . TUBAL LIGATION    . US ECHOCARDIOGRAPHY  01/07/2008   EF 55-60%  . VIDEO ASSISTED THORACOSCOPY (VATS)/WEDGE RESECTION Left 08/14/2014   Procedure: VIDEO ASSISTED THORACOSCOPY (VATS)/WEDGE RESECTION;  Surgeon: Melrose Nakayama, MD;  Location: [facility];  Service: Thoracic;  Laterality: Left;    REVIEW OF SYSTEMS:  A comprehensive review of systems was negative.   PHYSICAL EXAMINATION: General appearance: alert, cooperative and no distress Head: Normocephalic, without obvious abnormality, atraumatic Neck: no adenopathy, no JVD, supple, symmetrical, trachea midline and thyroid not enlarged, symmetric, no tenderness/mass/nodules Lymph nodes: Cervical, supraclavicular, and axillary nodes normal. Resp: clear to auscultation bilaterally Back: negative Cardio: regular rate and rhythm, S1, S2 normal, no murmur, click, rub or gallop GI: soft, non-tender; bowel sounds normal; no masses,  no organomegaly Extremities: extremities normal, atraumatic, no cyanosis or edema  ECOG PERFORMANCE STATUS: 1 - Symptomatic but completely ambulatory  Blood pressure 122/69, pulse 74, temperature 98.3 F (36.8 C), temperature source Oral, resp. rate 20, height 5\' 4"  (1.626 m), weight 143 lb 4.8 oz (65 kg), SpO2  97 %.  LABORATORY DATA: Lab Results  Component Value Date   WBC 6.6 04/25/2017   HGB 15.7 04/25/2017   HCT 46.5 04/25/2017   MCV 96.3 04/25/2017   PLT 213 04/25/2017      Chemistry      Component Value Date/Time   NA 139 04/25/2017 0940   K 3.7 04/25/2017 0940   CL 109 08/16/2014 0520   CO2 19 (L) 04/25/2017 0940   BUN 7.3 04/25/2017 0940   CREATININE 0.7 04/25/2017 0940      Component Value Date/Time   CALCIUM 9.6 04/25/2017 0940   ALKPHOS 91 04/25/2017 0940   AST 20 04/25/2017 0940   ALT 14 04/25/2017 0940   BILITOT 0.46 04/25/2017 0940       RADIOGRAPHIC STUDIES: No results found.  ASSESSMENT AND PLAN:  This is a very pleasant 56 years old white female with recurrent non-small cell lung cancer, adenocarcinoma with positive PDL 1 expression  of 60%. The patient is currently on treatment with immunotherapy with Ketruda 200 mg IV every 3 weeks status post 34 cycles. She tolerated the last cycle of her treatment well with no significant adverse effects. I recommended for the patient to proceed with cycle #35 today as a scheduled.  She would complete 2 years of treatment with Keytruda with the cycle. She will have repeat CT scan of the chest, abdomen and pelvis for restaging of her disease and the patient will come back for follow-up visit in 1 month. She was advised to call immediately if she has any concerning symptoms in the interval. For the hypothyroidism, I will give the patient refill for levothyroxine. The patient voices understanding of current disease status and treatment options and is in agreement with the current care plan. All questions were answered. The patient knows to call the clinic with any problems, questions or concerns. We can certainly see the patient much sooner if necessary.  Disclaimer: This note was dictated with voice recognition software. Similar sounding words can inadvertently be transcribed and may not be corrected upon review.

## 2017-04-25 NOTE — Telephone Encounter (Signed)
Scheduled appt per 11/27 los - Gave patient AVS and calender per los. Central radiology to contact patient with ct schedule.

## 2017-04-25 NOTE — Progress Notes (Signed)
Oncology Nurse Navigator Documentation  Oncology Nurse Navigator Flowsheets 04/25/2017  Navigator Location [facility]  Navigator Encounter Type Clinic/MDC/I spoke with patient and daughter today in clinic. Patient is having her last Keytruda treatment today.  She seems happy about that. Patient is still smoking and I gave her information and encouragement to quit. She was not open to conversation about quitting.   Patient Visit Type MedOnc  Treatment Phase Final Chemo TX  Barriers/Navigation Needs Education  Education Smoking cessation  Interventions Education  Education Method Verbal;Written  Acuity Level 1  Time Spent with Patient 15

## 2017-04-25 NOTE — Patient Instructions (Signed)
[facility] Discharge Instructions for Patients Receiving Chemotherapy  Today you received the following chemotherapy agents: Pembrolizumab Beryle Flock).  To help prevent nausea and vomiting after your treatment, we encourage you to take your nausea medication as prescribed.  If you develop nausea and vomiting that is not controlled by your nausea medication, call the clinic.   BELOW ARE SYMPTOMS THAT SHOULD BE REPORTED IMMEDIATELY:  *FEVER GREATER THAN 100.5 F  *CHILLS WITH OR WITHOUT FEVER  NAUSEA AND VOMITING THAT IS NOT CONTROLLED WITH YOUR NAUSEA MEDICATION  *UNUSUAL SHORTNESS OF BREATH  *UNUSUAL BRUISING OR BLEEDING  TENDERNESS IN MOUTH AND THROAT WITH OR WITHOUT PRESENCE OF ULCERS  *URINARY PROBLEMS  *BOWEL PROBLEMS  UNUSUAL RASH Items with * indicate a potential emergency and should be followed up as soon as possible.  Feel free to call the clinic should you have any questions or concerns. The clinic phone number is [phone].  Please show the [facility] at check-in to the Emergency Department and triage nurse.

## 2017-05-16 ENCOUNTER — Ambulatory Visit: Payer: BLUE CROSS/BLUE SHIELD | Admitting: Internal Medicine

## 2017-05-16 ENCOUNTER — Other Ambulatory Visit: Payer: BLUE CROSS/BLUE SHIELD

## 2017-05-16 ENCOUNTER — Ambulatory Visit: Payer: BLUE CROSS/BLUE SHIELD

## 2017-05-24 ENCOUNTER — Other Ambulatory Visit: Payer: BLUE CROSS/BLUE SHIELD

## 2017-05-24 ENCOUNTER — Ambulatory Visit (HOSPITAL_COMMUNITY): Payer: BLUE CROSS/BLUE SHIELD

## 2017-05-25 ENCOUNTER — Ambulatory Visit (HOSPITAL_COMMUNITY)
Admission: RE | Admit: 2017-05-25 | Discharge: 2017-05-25 | Disposition: A | Discharge status: Home / Self Care | Payer: BLUE CROSS/BLUE SHIELD | Source: Ambulatory Visit | Attending: Internal Medicine | Admitting: Internal Medicine

## 2017-05-25 ENCOUNTER — Other Ambulatory Visit: Payer: BLUE CROSS/BLUE SHIELD

## 2017-05-25 ENCOUNTER — Other Ambulatory Visit (HOSPITAL_BASED_OUTPATIENT_CLINIC_OR_DEPARTMENT_OTHER): Payer: BLUE CROSS/BLUE SHIELD

## 2017-05-25 DIAGNOSIS — Z9889 Other specified postprocedural states: Secondary | ICD-10-CM | POA: Insufficient documentation

## 2017-05-25 DIAGNOSIS — J432 Centrilobular emphysema: Secondary | ICD-10-CM | POA: Insufficient documentation

## 2017-05-25 DIAGNOSIS — C3490 Malignant neoplasm of unspecified part of unspecified bronchus or lung: Secondary | ICD-10-CM | POA: Insufficient documentation

## 2017-05-25 DIAGNOSIS — I700 Atherosclerosis of aorta: Secondary | ICD-10-CM | POA: Insufficient documentation

## 2017-05-25 DIAGNOSIS — Z902 Acquired absence of lung [part of]: Secondary | ICD-10-CM | POA: Diagnosis not present

## 2017-05-25 DIAGNOSIS — I358 Other nonrheumatic aortic valve disorders: Secondary | ICD-10-CM | POA: Insufficient documentation

## 2017-05-25 DIAGNOSIS — C3432 Malignant neoplasm of lower lobe, left bronchus or lung: Secondary | ICD-10-CM

## 2017-05-25 DIAGNOSIS — I2510 Atherosclerotic heart disease of native coronary artery without angina pectoris: Secondary | ICD-10-CM | POA: Diagnosis not present

## 2017-05-25 DIAGNOSIS — J438 Other emphysema: Secondary | ICD-10-CM | POA: Insufficient documentation

## 2017-05-25 DIAGNOSIS — R5382 Chronic fatigue, unspecified: Secondary | ICD-10-CM

## 2017-05-25 DIAGNOSIS — E039 Hypothyroidism, unspecified: Secondary | ICD-10-CM

## 2017-05-25 DIAGNOSIS — C3412 Malignant neoplasm of upper lobe, left bronchus or lung: Secondary | ICD-10-CM

## 2017-05-25 DIAGNOSIS — I77810 Thoracic aortic ectasia: Secondary | ICD-10-CM | POA: Insufficient documentation

## 2017-05-25 LAB — COMPREHENSIVE METABOLIC PANEL
ALT: 20 U/L (ref 0–55)
ANION GAP: 9 meq/L (ref 3–11)
AST: 23 U/L (ref 5–34)
Albumin: 3.5 g/dL (ref 3.5–5.0)
Alkaline Phosphatase: 94 U/L (ref 40–150)
BILIRUBIN TOTAL: 0.38 mg/dL (ref 0.20–1.20)
BUN: 10 mg/dL (ref 7.0–26.0)
CO2: 23 meq/L (ref 22–29)
CREATININE: 0.7 mg/dL (ref 0.6–1.1)
Calcium: 9.4 mg/dL (ref 8.4–10.4)
Chloride: 107 mEq/L (ref 98–109)
EGFR: >60 mL/min/{1.73_m2} (ref >60)
Glucose: 94 mg/dl (ref 70–140)
Potassium: 3.5 mEq/L (ref 3.5–5.1)
Sodium: 139 mEq/L (ref 136–145)
TOTAL PROTEIN: 7.2 g/dL (ref 6.4–8.3)

## 2017-05-25 LAB — CBC WITH DIFFERENTIAL/PLATELET
BASO%: 1.1 % (ref 0.0–2.0)
BASOS ABS: 0.1 10*3/uL (ref 0.0–0.1)
EOS%: 1.6 % (ref 0.0–7.0)
Eosinophils Absolute: 0.1 10*3/uL (ref 0.0–0.5)
HCT: 45.5 % (ref 34.8–46.6)
HEMOGLOBIN: 15.4 g/dL (ref 11.6–15.9)
LYMPH%: 36 % (ref 14.0–49.7)
MCH: 32.1 pg (ref 25.1–34.0)
MCHC: 33.7 g/dL (ref 31.5–36.0)
MCV: 95.2 fL (ref 79.5–101.0)
MONO#: 0.5 10*3/uL (ref 0.1–0.9)
MONO%: 8.3 % (ref 0.0–14.0)
NEUT#: 3.3 10*3/uL (ref 1.5–6.5)
NEUT%: 53 % (ref 38.4–76.8)
Platelets: 205 10*3/uL (ref 145–400)
RBC: 4.78 10*6/uL (ref 3.70–5.45)
RDW: 14.3 % (ref 11.2–14.5)
WBC: 6.2 10*3/uL (ref 3.9–10.3)
lymph#: 2.2 10*3/uL (ref 0.9–3.3)

## 2017-05-25 LAB — TSH

## 2017-05-25 MED ORDER — IOPAMIDOL (ISOVUE-300) INJECTION 61%
30.0000 mL | Freq: Once | INTRAVENOUS | Status: AC | PRN
  Administered 2017-05-25: 30 mL via ORAL

## 2017-05-25 MED ORDER — IOPAMIDOL (ISOVUE-300) INJECTION 61%
INTRAVENOUS | Status: AC
  Administered 2017-05-25: 30 mL via ORAL
  Filled 2017-05-25: qty 30

## 2017-05-25 MED ORDER — IOPAMIDOL (ISOVUE-300) INJECTION 61%
100.0000 mL | Freq: Once | INTRAVENOUS | Status: AC | PRN
  Administered 2017-05-25: 100 mL via INTRAVENOUS

## 2017-05-25 MED ORDER — IOPAMIDOL (ISOVUE-300) INJECTION 61%
INTRAVENOUS | Status: AC
  Filled 2017-05-25: qty 100

## 2017-05-29 ENCOUNTER — Encounter: Payer: Self-pay | Admitting: Internal Medicine

## 2017-05-29 ENCOUNTER — Telehealth: Payer: Self-pay | Admitting: Internal Medicine

## 2017-05-29 ENCOUNTER — Ambulatory Visit (HOSPITAL_BASED_OUTPATIENT_CLINIC_OR_DEPARTMENT_OTHER): Payer: BLUE CROSS/BLUE SHIELD | Admitting: Internal Medicine

## 2017-05-29 DIAGNOSIS — C3490 Malignant neoplasm of unspecified part of unspecified bronchus or lung: Secondary | ICD-10-CM

## 2017-05-29 DIAGNOSIS — C3432 Malignant neoplasm of lower lobe, left bronchus or lung: Secondary | ICD-10-CM | POA: Diagnosis not present

## 2017-05-29 DIAGNOSIS — E039 Hypothyroidism, unspecified: Secondary | ICD-10-CM

## 2017-05-29 MED ORDER — LEVOTHYROXINE SODIUM 125 MCG PO TABS: 125.0000 ug | ORAL_TABLET | Freq: Every day | ORAL | 0 refills | Status: DC

## 2017-05-29 NOTE — Progress Notes (Signed)
[facility] Telephone:([phone]) [phone]   Fax:(336) [place], NP [place] [address]  DIAGNOSIS: Recurrent non-small cell lung cancer, adenocarcinoma with PDL 1 expression of 60% diagnosed in November 2016. The patient was initially diagnosed as stage IB (T2a, N0, M0) adenocarcinoma in March 2016.  PRIOR THERAPY:   1) Status post left video-assisted thoracoscopy, wedge resection left lower lobe, thoracoscopic left lower lobectomy and mediastinal lymph node dissection on 08/14/2014.  2) Immunotherapy with Ketruda (pembrolizumab) 200 mg IV every 3 weeks status post 35 cycles. First dose was given 05/12/2015.  This was discontinued after the patient completed 2 years of treatment.  CURRENT THERAPY: Observation.    INTERVAL HISTORY: Sharon [name] 56 y.o. female returns to the clinic today for follow-up visit accompanied by her daughter.  The patient is feeling fine today with no specific complaints.  She denied having any chest pain, shortness of breath, cough or hemoptysis.  She denied having any weight loss or night sweats.  She has no nausea, vomiting, diarrhea or constipation.  She tolerated the last cycle of her treatment fairly well with no significant adverse effects.  The patient had a repeat CT scan of the chest, abdomen and pelvis performed recently and she is here for evaluation and discussion of her risk her results.   MEDICAL HISTORY: Past Medical History:  Diagnosis Date  . Aortic insufficiency    Echo 8/09:  EF 55-60, impaired relaxation, mild aortic stenosis (mean 8.1), moderate aortic insufficiency, trace MR, trace TR // Echo 4/18: mild LVH, EF 55-60, no RWMA, Gr 1 DD, mod AI   . Arthritis   . Carotid artery disease ([facility]) 03/09/2011   Carotid US 8/15 - R 40-59; L1-39 >> follow-up 1 year // Carotid US 4/18: ICA 1-39 bilaterally - repeat 1 yr  . COPD (chronic obstructive pulmonary disease) ([facility])   .  Coronary artery disease involving native coronary artery of native heart without angina pectoris 11/24/2009   s/p ant STEMI 5/09 >> LHC 5/09 - LM normal; LAD proximal 99; branching DX with one branch occluded and the other severely diseased; LCx okay with irregularities in the OM1; RCA proximal occluded-CTO; EF 30-40, anterior wall HK  >>  PCI: 3 x 18 mm Promus DES, 2.5 x 15 mm Promus DES to the LAD  . Emphysema of lung ([facility])   . Headache 09/06/2016  . History of acute anterior wall MI 12/2007   s/p DES x 2 to LAD  . Hyperlipidemia   . Ischemic cardiomyopathy   . Non-small cell lung cancer ([facility])    per CT CHEST/PET 2/4 and 07/18/14 @ [facility]  . Pneumonia    2015   HX BRONCHITIS    . Tobacco user     ALLERGIES:  is allergic to codeine; lipitor [atorvastatin calcium]; morphine and related; and oxycodone-acetaminophen.  MEDICATIONS:  Current Outpatient Medications  Medication Sig Dispense Refill  . aspirin (ASPIR-LOW) 81 MG EC tablet Take 81 mg by mouth daily. On hold    . clopidogrel (PLAVIX) 75 MG tablet TAKE 1 TABLET BY MOUTH EVERY DAY 90 tablet 2  . levothyroxine (SYNTHROID, LEVOTHROID) 125 MCG tablet Take 1 tablet (125 mcg total) by mouth daily before breakfast. 90 tablet 0  . Magnesium 250 MG TABS Take 250 mg by mouth daily.    . ondansetron (ZOFRAN) 8 MG tablet TAKE 1 TABLET (8 MG TOTAL) BY MOUTH EVERY 8 (EIGHT)  HOURS AS NEEDED FOR NAUSEA OR VOMITING. 30 tablet 0  . Pembrolizumab (KEYTRUDA IV) TAKE KEYTRUDA  IV INFUSION ONCE EVERY 3 WEEKS FOR CHEMO( PT. NOT SURE OF DOSAGE)    . potassium chloride (MICRO-K) 10 MEQ CR capsule Take 10 mEq by mouth 2 (two) times daily.  0  . traMADol (ULTRAM) 50 MG tablet Take 50 mg by mouth every 6 (six) hours as needed for moderate pain or severe pain.    . rosuvastatin (CRESTOR) 40 MG tablet Take 1 tablet (40 mg total) by mouth daily. 90 tablet 3   No current facility-administered medications for this visit.     SURGICAL HISTORY:  Past Surgical  History:  Procedure Laterality Date  . CARDIAC CATHETERIZATION  10/15/2007   EF 30-40%  . CESAREAN SECTION    . CHOLECYSTECTOMY    . CORONARY ANGIOPLASTY WITH STENT PLACEMENT     LAD  . ectopic pregnancies requiring laparotomies    . LEAD REMOVAL Left 08/14/2014   Procedure: CRYO INTERCOSTAL NERVE BLOCK;  Surgeon: Melrose Nakayama, MD;  Location: [facility];  Service: Thoracic;  Laterality: Left;  . LOBECTOMY Left 08/14/2014   Procedure: LEFT LOWER LOBECTOMY;  Surgeon: Melrose Nakayama, MD;  Location: [facility];  Service: Thoracic;  Laterality: Left;  . NODE DISSECTION Left 08/14/2014   Procedure: NODE DISSECTION, LEFT LOWER LOBE LUNG;  Surgeon: Melrose Nakayama, MD;  Location: [facility];  Service: Thoracic;  Laterality: Left;  . SHOULDER SURGERY     LEFT X 2   (REMOVED SOME COLLAR BONE )  . TRANSTHORACIC ECHOCARDIOGRAM  10/17/2007  . TUBAL LIGATION    . US ECHOCARDIOGRAPHY  01/07/2008   EF 55-60%  . VIDEO ASSISTED THORACOSCOPY (VATS)/WEDGE RESECTION Left 08/14/2014   Procedure: VIDEO ASSISTED THORACOSCOPY (VATS)/WEDGE RESECTION;  Surgeon: Melrose Nakayama, MD;  Location: [facility];  Service: Thoracic;  Laterality: Left;    REVIEW OF SYSTEMS:  Constitutional: negative Eyes: negative Ears, nose, mouth, throat, and face: negative Respiratory: negative Cardiovascular: negative Gastrointestinal: negative Genitourinary:negative Integument/breast: negative Hematologic/lymphatic: negative Musculoskeletal:negative Neurological: negative Behavioral/Psych: negative Endocrine: negative Allergic/Immunologic: negative   PHYSICAL EXAMINATION: General appearance: alert, cooperative and no distress Head: Normocephalic, without obvious abnormality, atraumatic Neck: no adenopathy, no JVD, supple, symmetrical, trachea midline and thyroid not enlarged, symmetric, no tenderness/mass/nodules Lymph nodes: Cervical, supraclavicular, and axillary nodes normal. Resp: clear to auscultation  bilaterally Back: negative Cardio: regular rate and rhythm, S1, S2 normal, no murmur, click, rub or gallop GI: soft, non-tender; bowel sounds normal; no masses,  no organomegaly Extremities: extremities normal, atraumatic, no cyanosis or edema Neurologic: Alert and oriented X 3, normal strength and tone. Normal symmetric reflexes. Normal coordination and gait  ECOG PERFORMANCE STATUS: 1 - Symptomatic but completely ambulatory  Blood pressure 113/69, pulse 92, temperature 98.5 F (36.9 C), temperature source Oral, resp. rate 18, height 5\' 4"  (1.626 m), weight 145 lb 3.2 oz (65.9 kg), SpO2 96 %.  LABORATORY DATA: Lab Results  Component Value Date   WBC 6.2 05/25/2017   HGB 15.4 05/25/2017   HCT 45.5 05/25/2017   MCV 95.2 05/25/2017   PLT 205 05/25/2017      Chemistry      Component Value Date/Time   NA 139 05/25/2017 1344   K 3.5 05/25/2017 1344   CL 109 08/16/2014 0520   CO2 23 05/25/2017 1344   BUN 10.0 05/25/2017 1344   CREATININE 0.7 05/25/2017 1344      Component Value Date/Time   CALCIUM 9.4 05/25/2017 1344  ALKPHOS 94 05/25/2017 1344   AST 23 05/25/2017 1344   ALT 20 05/25/2017 1344   BILITOT 0.38 05/25/2017 1344       RADIOGRAPHIC STUDIES: Ct Chest W Contrast  Result Date: 05/25/2017 CLINICAL DATA:  56 year old female with history of recurrent non-small cell lung cancer (adenocarcinoma). Follow-up after immunotherapy. EXAM: CT CHEST, ABDOMEN, AND PELVIS WITH CONTRAST TECHNIQUE: Multidetector CT imaging of the chest, abdomen and pelvis was performed following the standard protocol during bolus administration of intravenous contrast. CONTRAST:  198mL ISOVUE-300 IOPAMIDOL (ISOVUE-300) INJECTION 61% COMPARISON:  CT of the chest, abdomen and pelvis 01/16/2017. FINDINGS: CT CHEST FINDINGS Cardiovascular: Heart size is normal. There is no significant pericardial fluid, thickening or pericardial calcification. There is aortic atherosclerosis, as well as atherosclerosis of  the great vessels of the mediastinum and the coronary arteries, including calcified atherosclerotic plaque in the left main, left anterior descending, left circumflex and right coronary arteries. Severe calcifications and thickening of the aortic valve. Ectasia of the ascending thoracic aorta (4 cm in diameter). Mediastinum/Nodes: Multiple prominent borderline enlarged lymph nodes measuring up to 11 mm in short axis in the prevascular nodal station, similar to the prior examination. Esophagus is unremarkable in appearance. No axillary lymphadenopathy. Lungs/Pleura: Status post left lower lobectomy. Compensatory hyperexpansion of the left upper lobe. Focal nodular area of architectural distortion in the posterior aspect of the most dependent portion of the left upper lobe (axial image 109 of series 7), similar to numerous prior examinations, favored to represent air chronic scarring. No other suspicious appearing pulmonary nodules or masses. No acute consolidative airspace disease. No pleural effusions. Diffuse bronchial wall thickening with mild to moderate centrilobular and paraseptal emphysema. Musculoskeletal: There are no aggressive appearing lytic or blastic lesions noted in the visualized portions of the skeleton. CT ABDOMEN PELVIS FINDINGS Hepatobiliary: No suspicious cystic or solid hepatic lesions. No intra or extrahepatic biliary ductal dilatation. Status post cholecystectomy. Pancreas: No pancreatic mass. No pancreatic ductal dilatation. No pancreatic or peripancreatic fluid or inflammatory changes. Spleen: Unremarkable. Adrenals/Urinary Tract: Bilateral kidneys and bilateral adrenal glands are normal in appearance. No hydroureteronephrosis. Urinary bladder is normal in appearance. Stomach/Bowel: Normal appearance of the stomach. No pathologic dilatation of small bowel or colon. Normal appendix. Vascular/Lymphatic: Aortic atherosclerosis, without evidence of aneurysm or dissection in the abdominal or  pelvic vasculature. No lymphadenopathy noted in the abdomen or pelvis. Reproductive: Uterus and ovaries are unremarkable in appearance. Other: No significant volume of ascites.  No pneumoperitoneum. Musculoskeletal: There are no aggressive appearing lytic or blastic lesions noted in the visualized portions of the skeleton. IMPRESSION: 1. Stable postoperative changes of left lower lobectomy, with no findings to suggest recurrent or metastatic disease in the chest, abdomen or pelvis. 2. Aortic atherosclerosis, in addition to left main and 3 vessel coronary artery disease. Please note that although the presence of coronary artery calcium documents the presence of coronary artery disease, the severity of this disease and any potential stenosis cannot be assessed on this non-gated CT examination. Assessment for potential risk factor modification, dietary therapy or pharmacologic therapy may be warranted, if clinically indicated. 3. Ectasia of the ascending thoracic aorta (4 cm in diameter), unchanged compared to the prior study. Recommend annual imaging followup by CTA or MRA. This recommendation follows 2010 ACCF/AHA/AATS/ACR/ASA/SCA/SCAI/SIR/STS/SVM Guidelines for the Diagnosis and Management of Patients with Thoracic Aortic Disease. Circulation. 2010; 121: W466-Z993. 4. Diffuse bronchial wall thickening with mild to moderate centrilobular and paraseptal emphysema; imaging findings suggestive of underlying COPD. 5. There are calcifications of  the aortic valve. Echocardiographic correlation for evaluation of potential valvular dysfunction may be warranted if clinically indicated. 6. Additional incidental findings, as above. Electronically Signed   By: Vinnie Langton M.D.   On: 05/25/2017 16:59   Ct Abdomen Pelvis W Contrast  Result Date: 05/25/2017 CLINICAL DATA:  56 year old female with history of recurrent non-small cell lung cancer (adenocarcinoma). Follow-up after immunotherapy. EXAM: CT CHEST, ABDOMEN, AND  PELVIS WITH CONTRAST TECHNIQUE: Multidetector CT imaging of the chest, abdomen and pelvis was performed following the standard protocol during bolus administration of intravenous contrast. CONTRAST:  117mL ISOVUE-300 IOPAMIDOL (ISOVUE-300) INJECTION 61% COMPARISON:  CT of the chest, abdomen and pelvis 01/16/2017. FINDINGS: CT CHEST FINDINGS Cardiovascular: Heart size is normal. There is no significant pericardial fluid, thickening or pericardial calcification. There is aortic atherosclerosis, as well as atherosclerosis of the great vessels of the mediastinum and the coronary arteries, including calcified atherosclerotic plaque in the left main, left anterior descending, left circumflex and right coronary arteries. Severe calcifications and thickening of the aortic valve. Ectasia of the ascending thoracic aorta (4 cm in diameter). Mediastinum/Nodes: Multiple prominent borderline enlarged lymph nodes measuring up to 11 mm in short axis in the prevascular nodal station, similar to the prior examination. Esophagus is unremarkable in appearance. No axillary lymphadenopathy. Lungs/Pleura: Status post left lower lobectomy. Compensatory hyperexpansion of the left upper lobe. Focal nodular area of architectural distortion in the posterior aspect of the most dependent portion of the left upper lobe (axial image 109 of series 7), similar to numerous prior examinations, favored to represent air chronic scarring. No other suspicious appearing pulmonary nodules or masses. No acute consolidative airspace disease. No pleural effusions. Diffuse bronchial wall thickening with mild to moderate centrilobular and paraseptal emphysema. Musculoskeletal: There are no aggressive appearing lytic or blastic lesions noted in the visualized portions of the skeleton. CT ABDOMEN PELVIS FINDINGS Hepatobiliary: No suspicious cystic or solid hepatic lesions. No intra or extrahepatic biliary ductal dilatation. Status post cholecystectomy. Pancreas:  No pancreatic mass. No pancreatic ductal dilatation. No pancreatic or peripancreatic fluid or inflammatory changes. Spleen: Unremarkable. Adrenals/Urinary Tract: Bilateral kidneys and bilateral adrenal glands are normal in appearance. No hydroureteronephrosis. Urinary bladder is normal in appearance. Stomach/Bowel: Normal appearance of the stomach. No pathologic dilatation of small bowel or colon. Normal appendix. Vascular/Lymphatic: Aortic atherosclerosis, without evidence of aneurysm or dissection in the abdominal or pelvic vasculature. No lymphadenopathy noted in the abdomen or pelvis. Reproductive: Uterus and ovaries are unremarkable in appearance. Other: No significant volume of ascites.  No pneumoperitoneum. Musculoskeletal: There are no aggressive appearing lytic or blastic lesions noted in the visualized portions of the skeleton. IMPRESSION: 1. Stable postoperative changes of left lower lobectomy, with no findings to suggest recurrent or metastatic disease in the chest, abdomen or pelvis. 2. Aortic atherosclerosis, in addition to left main and 3 vessel coronary artery disease. Please note that although the presence of coronary artery calcium documents the presence of coronary artery disease, the severity of this disease and any potential stenosis cannot be assessed on this non-gated CT examination. Assessment for potential risk factor modification, dietary therapy or pharmacologic therapy may be warranted, if clinically indicated. 3. Ectasia of the ascending thoracic aorta (4 cm in diameter), unchanged compared to the prior study. Recommend annual imaging followup by CTA or MRA. This recommendation follows 2010 ACCF/AHA/AATS/ACR/ASA/SCA/SCAI/SIR/STS/SVM Guidelines for the Diagnosis and Management of Patients with Thoracic Aortic Disease. Circulation. 2010; 121: K025-K270. 4. Diffuse bronchial wall thickening with mild to moderate centrilobular and paraseptal emphysema; imaging  findings suggestive of  underlying COPD. 5. There are calcifications of the aortic valve. Echocardiographic correlation for evaluation of potential valvular dysfunction may be warranted if clinically indicated. 6. Additional incidental findings, as above. Electronically Signed   By: Vinnie Langton M.D.   On: 05/25/2017 16:59    ASSESSMENT AND PLAN:  This is a very pleasant 56 years old white female with recurrent non-small cell lung cancer, adenocarcinoma with positive PDL 1 expression of 60%. The patient completed treatment with immunotherapy with Ketruda 200 mg IV every 3 weeks status post 35 cycles. She tolerated this course of treatment fairly well with no significant adverse effects. She had repeat CT scan of the chest, abdomen and pelvis performed recently.  I personally and independently reviewed the scans and discussed the results with the patient and her daughter.  Her scan showed no concerning findings for disease progression.  I recommended for the patient to continue on observation with repeat CT scan of the chest, abdomen and pelvis in 3 months.  Regarding the aortic atherosclerosis and three-vessel coronary artery disease, I strongly encouraged the patient to contact her primary care physician for evaluation and treatment of her risk factors. For the hypothyroidism, she will continue with levothyroxine of her medication for the next 3 months. The patient was advised to call immediately if she has any concerning symptoms in the interval. The patient voices understanding of current disease status and treatment options and is in agreement with the current care plan. All questions were answered. The patient knows to call the clinic with any problems, questions or concerns. We can certainly see the patient much sooner if necessary.  Disclaimer: This note was dictated with voice recognition software. Similar sounding words can inadvertently be transcribed and may not be corrected upon review.

## 2017-05-29 NOTE — Telephone Encounter (Signed)
Scheduled appt per 12/31 los - Gave pt AVS and calender per los. - Patient to be contact by [facility] to schedule ct.

## 2017-08-14 ENCOUNTER — Telehealth: Payer: Self-pay | Admitting: *Deleted

## 2017-08-14 NOTE — Telephone Encounter (Signed)
-----   Message from Liliane Shi, [place] sent at 08/14/2017  3:49 PM EDT ----- Labs from PCP reviewed.  LFTs normal.  Lipids not at goal (LDL 100 - goal is < 70). PLAN:  1. Increase Crestor to 40 mg Once daily  2. Check Lipids and LFTs in 3 months.  Richardson Dopp, PA-C    08/14/2017 3:47 PM

## 2017-08-14 NOTE — Telephone Encounter (Signed)
I s/w pt in regards to her lab results. I advised pt of dose increase on Crestor to 40 mg daily. Pt states she is already on Crestor 40 mg daily. I advised pt to let me d/w PA and I will call her back with new recommendations. Pt also asked when was she supposed to come back for her f/u. I reviewed the chart and saw there was a recall for Dr. Acie Fredrickson , though not until 2021. I reviewed last note per Richardson Dopp, PA who saw pt on 09/21/16, advised 1 yr f/u at that time. Pt has been scheduled to see Brynda Rim. PA 08/28/17 @ 2:45. Pt thanked me for my call and my help today. I will call pt back once I d/w further with PA as to dose for Crestor.

## 2017-08-15 ENCOUNTER — Telehealth: Payer: Self-pay | Admitting: *Deleted

## 2017-08-15 ENCOUNTER — Other Ambulatory Visit: Payer: Self-pay | Admitting: Internal Medicine

## 2017-08-15 DIAGNOSIS — C3490 Malignant neoplasm of unspecified part of unspecified bronchus or lung: Secondary | ICD-10-CM

## 2017-08-15 MED ORDER — EZETIMIBE 10 MG PO TABS: 10.0000 mg | ORAL_TABLET | Freq: Every day | ORAL | 3 refills | Status: DC

## 2017-08-15 NOTE — Telephone Encounter (Signed)
-----   Message from Liliane Shi, [place] sent at 08/15/2017  4:35 PM EDT ----- Looks like Zetia was recommended in the past but I do not see in her med list. Ask if she ever tried Zetia.  If so, did she stop b/c of side effects? If never tried or no side effects >> start Zetia 10 mg Once daily and check Lipids/LFTs in mos. Richardson Dopp, PA-C    08/15/2017 4:34 PM

## 2017-08-15 NOTE — Telephone Encounter (Signed)
Pt informed of new recommendations per Sharon Dopp, PA to try Zetia. Per PA looks like Zetia was recommended in the past, though do not see on med list. Pt not sure if she has tried Zetia or not however is willing to try. Pt states she is very limited income and tells me that had to pay $17.00 for her thyroid medications,  she states Medicaid charge should had been $3. I advised pt lets send in the Rx for Zetia and see what the cost is. Pt is scheduled to see PA 08/28/17. Pt is agreeable to plan of care and thanked me for my call today.

## 2017-08-25 ENCOUNTER — Ambulatory Visit (HOSPITAL_COMMUNITY)
Admission: RE | Admit: 2017-08-25 | Discharge: 2017-08-25 | Disposition: A | Discharge status: Home / Self Care | Payer: BLUE CROSS/BLUE SHIELD | Source: Ambulatory Visit | Attending: Internal Medicine | Admitting: Internal Medicine

## 2017-08-25 ENCOUNTER — Inpatient Hospital Stay: Payer: BLUE CROSS/BLUE SHIELD | Attending: Internal Medicine

## 2017-08-25 DIAGNOSIS — R590 Localized enlarged lymph nodes: Secondary | ICD-10-CM | POA: Diagnosis not present

## 2017-08-25 DIAGNOSIS — Z902 Acquired absence of lung [part of]: Secondary | ICD-10-CM | POA: Insufficient documentation

## 2017-08-25 DIAGNOSIS — I700 Atherosclerosis of aorta: Secondary | ICD-10-CM | POA: Insufficient documentation

## 2017-08-25 DIAGNOSIS — C3490 Malignant neoplasm of unspecified part of unspecified bronchus or lung: Secondary | ICD-10-CM | POA: Diagnosis not present

## 2017-08-25 DIAGNOSIS — R5382 Chronic fatigue, unspecified: Secondary | ICD-10-CM

## 2017-08-25 DIAGNOSIS — C3432 Malignant neoplasm of lower lobe, left bronchus or lung: Secondary | ICD-10-CM | POA: Diagnosis not present

## 2017-08-25 DIAGNOSIS — J432 Centrilobular emphysema: Secondary | ICD-10-CM | POA: Diagnosis not present

## 2017-08-25 DIAGNOSIS — E039 Hypothyroidism, unspecified: Secondary | ICD-10-CM | POA: Diagnosis not present

## 2017-08-25 DIAGNOSIS — C3412 Malignant neoplasm of upper lobe, left bronchus or lung: Secondary | ICD-10-CM

## 2017-08-25 DIAGNOSIS — I2510 Atherosclerotic heart disease of native coronary artery without angina pectoris: Secondary | ICD-10-CM | POA: Diagnosis not present

## 2017-08-25 LAB — COMPREHENSIVE METABOLIC PANEL
ALBUMIN: 3.8 g/dL (ref 3.5–5.0)
ALT: 22 U/L (ref 0–55)
AST: 23 U/L (ref 5–34)
Alkaline Phosphatase: 109 U/L (ref 40–150)
Anion gap: 11 (ref 3–11)
BILIRUBIN TOTAL: 0.4 mg/dL (ref 0.2–1.2)
BUN: 8 mg/dL (ref 7–26)
CHLORIDE: 101 mmol/L (ref 98–109)
CO2: 24 mmol/L (ref 22–29)
Calcium: 9.9 mg/dL (ref 8.4–10.4)
Creatinine, Ser: 0.78 mg/dL (ref 0.60–1.10)
GFR calc Af Amer: >60 mL/min (ref >60)
GLUCOSE: 94 mg/dL (ref 70–140)
POTASSIUM: 3.8 mmol/L (ref 3.5–5.1)
Sodium: 136 mmol/L (ref 136–145)
TOTAL PROTEIN: 7.8 g/dL (ref 6.4–8.3)

## 2017-08-25 LAB — CBC WITH DIFFERENTIAL/PLATELET
BASOS ABS: 0.1 10*3/uL (ref 0.0–0.1)
Basophils Relative: 1 %
Eosinophils Absolute: 0.1 10*3/uL (ref 0.0–0.5)
Eosinophils Relative: 2 %
HCT: 47.7 % — ABNORMAL HIGH (ref 34.8–46.6)
Hemoglobin: 16 g/dL — ABNORMAL HIGH (ref 11.6–15.9)
LYMPHS PCT: 32 %
Lymphs Abs: 2.1 10*3/uL (ref 0.9–3.3)
MCH: 32.7 pg (ref 25.1–34.0)
MCHC: 33.5 g/dL (ref 31.5–36.0)
MCV: 97.3 fL (ref 79.5–101.0)
Monocytes Absolute: 0.4 10*3/uL (ref 0.1–0.9)
Monocytes Relative: 7 %
Neutro Abs: 3.8 10*3/uL (ref 1.5–6.5)
Neutrophils Relative %: 58 %
Platelets: 214 10*3/uL (ref 145–400)
RBC: 4.9 MIL/uL (ref 3.70–5.45)
RDW: 14 % (ref 11.2–14.5)
WBC: 6.5 10*3/uL (ref 3.9–10.3)

## 2017-08-25 LAB — TSH: TSH: <0.08 u[IU]/mL — ABNORMAL LOW (ref 0.308–3.960)

## 2017-08-25 MED ORDER — IOPAMIDOL (ISOVUE-300) INJECTION 61%
INTRAVENOUS | Status: AC
  Filled 2017-08-25: qty 30

## 2017-08-25 MED ORDER — IOPAMIDOL (ISOVUE-300) INJECTION 61%
100.0000 mL | Freq: Once | INTRAVENOUS | Status: AC | PRN
  Administered 2017-08-25: 100 mL via INTRAVENOUS

## 2017-08-25 MED ORDER — IOPAMIDOL (ISOVUE-300) INJECTION 61%
30.0000 mL | Freq: Once | INTRAVENOUS | Status: AC | PRN
  Administered 2017-08-25: 30 mL via ORAL

## 2017-08-25 MED ORDER — IOPAMIDOL (ISOVUE-300) INJECTION 61%
INTRAVENOUS | Status: AC
  Filled 2017-08-25: qty 100

## 2017-08-28 ENCOUNTER — Inpatient Hospital Stay: Payer: BLUE CROSS/BLUE SHIELD | Attending: Internal Medicine | Admitting: Internal Medicine

## 2017-08-28 ENCOUNTER — Ambulatory Visit (INDEPENDENT_AMBULATORY_CARE_PROVIDER_SITE_OTHER): Payer: BLUE CROSS/BLUE SHIELD | Admitting: Physician Assistant

## 2017-08-28 ENCOUNTER — Telehealth: Payer: Self-pay

## 2017-08-28 ENCOUNTER — Encounter: Payer: Self-pay | Admitting: *Deleted

## 2017-08-28 ENCOUNTER — Encounter: Payer: Self-pay | Admitting: Physician Assistant

## 2017-08-28 ENCOUNTER — Encounter: Payer: Self-pay | Admitting: Internal Medicine

## 2017-08-28 VITALS — BP 120/56 | HR 69 | Ht 64.0 in | Wt 150.1 lb

## 2017-08-28 DIAGNOSIS — I739 Peripheral vascular disease, unspecified: Secondary | ICD-10-CM

## 2017-08-28 DIAGNOSIS — I2510 Atherosclerotic heart disease of native coronary artery without angina pectoris: Secondary | ICD-10-CM

## 2017-08-28 DIAGNOSIS — C3432 Malignant neoplasm of lower lobe, left bronchus or lung: Secondary | ICD-10-CM | POA: Insufficient documentation

## 2017-08-28 DIAGNOSIS — I779 Disorder of arteries and arterioles, unspecified: Secondary | ICD-10-CM

## 2017-08-28 DIAGNOSIS — E785 Hyperlipidemia, unspecified: Secondary | ICD-10-CM | POA: Diagnosis not present

## 2017-08-28 DIAGNOSIS — C3490 Malignant neoplasm of unspecified part of unspecified bronchus or lung: Secondary | ICD-10-CM

## 2017-08-28 DIAGNOSIS — I351 Nonrheumatic aortic (valve) insufficiency: Secondary | ICD-10-CM | POA: Diagnosis not present

## 2017-08-28 DIAGNOSIS — F17200 Nicotine dependence, unspecified, uncomplicated: Secondary | ICD-10-CM

## 2017-08-28 DIAGNOSIS — I712 Thoracic aortic aneurysm, without rupture: Secondary | ICD-10-CM

## 2017-08-28 DIAGNOSIS — E039 Hypothyroidism, unspecified: Secondary | ICD-10-CM | POA: Diagnosis not present

## 2017-08-28 DIAGNOSIS — Z720 Tobacco use: Secondary | ICD-10-CM | POA: Diagnosis not present

## 2017-08-28 DIAGNOSIS — I7121 Aneurysm of the ascending aorta, without rupture: Secondary | ICD-10-CM

## 2017-08-28 MED ORDER — METOPROLOL SUCCINATE ER 25 MG PO TB24: 25.0000 mg | ORAL_TABLET | Freq: Every day | ORAL | 3 refills | Status: DC

## 2017-08-28 NOTE — Progress Notes (Signed)
[facility] [phone]   Fax:([phone]) [place], [place] [address]  DIAGNOSIS: Recurrent non-small cell lung cancer, adenocarcinoma with PDL 1 expression of 60% diagnosed in November 2016. The patient was initially diagnosed as stage IB (T2a, N0, M0) adenocarcinoma in March 2016.  PRIOR THERAPY:   1) Status post left video-assisted thoracoscopy, wedge resection left lower lobe, thoracoscopic left lower lobectomy and mediastinal lymph node dissection on 08/14/2014.  2) Immunotherapy with Ketruda (pembrolizumab) 200 mg IV every 3 weeks status post 35 cycles. First dose was given 05/12/2015.  This was discontinued after the patient completed 2 years of treatment.  CURRENT THERAPY: Observation.   INTERVAL HISTORY: TANVEER DOBBERSTEIN 57 y.o. female returns to the clinic today for follow-up visit accompanied by her mother.  The patient is feeling fine today with no specific complaints.  She denied having any chest pain, shortness of breath, cough or hemoptysis.  She denied having any fever or chills.  She has no nausea, vomiting, diarrhea or constipation.  She has no significant weight loss or night sweats.  Unfortunately she continues to smoke 1 pack of cigarettes every day.  The patient had repeat CT scan of the chest and abdomen performed recently and she is here for evaluation and discussion of her scan results.   MEDICAL HISTORY: Past Medical History:  Diagnosis Date  . Aortic insufficiency    Echo 8/09:  EF 55-60, impaired relaxation, mild aortic stenosis (mean 8.1), moderate aortic insufficiency, trace MR, trace TR // Echo 4/18: mild LVH, EF 55-60, no RWMA, Gr 1 DD, mod AI   . Arthritis   . Carotid artery disease ([facility]) 03/09/2011   Carotid US 8/15 - R 40-59; L1-39 >> follow-up 1 year // Carotid US 4/18: ICA 1-39 bilaterally - repeat 1 yr  . COPD (chronic obstructive pulmonary disease) ([facility])   . Coronary  artery disease involving native coronary artery of native heart without angina pectoris 11/24/2009   s/p ant STEMI 5/09 >> LHC 5/09 - LM normal; LAD proximal 99; branching DX with one branch occluded and the other severely diseased; LCx okay with irregularities in the OM1; RCA proximal occluded-CTO; EF 30-40, anterior wall HK  >>  PCI: 3 x 18 mm Promus DES, 2.5 x 15 mm Promus DES to the LAD  . Emphysema of lung ([facility])   . Headache 09/06/2016  . History of acute anterior wall MI 12/2007   s/p DES x 2 to LAD  . Hyperlipidemia   . Ischemic cardiomyopathy   . Non-small cell lung cancer ([facility])    per CT CHEST/PET 2/4 and 07/18/14 @ [facility]  . Pneumonia    2015   HX BRONCHITIS    . Tobacco user     ALLERGIES:  is allergic to codeine; lipitor [atorvastatin calcium]; morphine and related; and oxycodone-acetaminophen.  MEDICATIONS:  Current Outpatient Medications  Medication Sig Dispense Refill  . aspirin (ASPIR-LOW) 81 MG EC tablet Take 81 mg by mouth daily. On hold    . clopidogrel (PLAVIX) 75 MG tablet TAKE 1 TABLET BY MOUTH EVERY DAY 90 tablet 2  . ezetimibe (ZETIA) 10 MG tablet Take 1 tablet (10 mg total) by mouth daily. 90 tablet 3  . levothyroxine (SYNTHROID, LEVOTHROID) 125 MCG tablet Take 1 tablet (125 mcg total) by mouth daily before breakfast. 90 tablet 0  . Magnesium 250 MG TABS Take 250 mg by mouth daily.    Marland Kitchen  ondansetron (ZOFRAN) 8 MG tablet TAKE 1 TABLET (8 MG TOTAL) BY MOUTH EVERY 8 (EIGHT) HOURS AS NEEDED FOR NAUSEA OR VOMITING. 30 tablet 0  . Pembrolizumab (KEYTRUDA IV) TAKE KEYTRUDA  IV INFUSION ONCE EVERY 3 WEEKS FOR CHEMO( PT. NOT SURE OF DOSAGE)    . potassium chloride (MICRO-K) 10 MEQ CR capsule Take 10 mEq by mouth 2 (two) times daily.  0  . rosuvastatin (CRESTOR) 40 MG tablet Take 1 tablet (40 mg total) by mouth daily. 90 tablet 3  . traMADol (ULTRAM) 50 MG tablet Take 50 mg by mouth every 6 (six) hours as needed for moderate pain or severe pain.     No current  facility-administered medications for this visit.     SURGICAL HISTORY:  Past Surgical History:  Procedure Laterality Date  . CARDIAC CATHETERIZATION  10/15/2007   EF 30-40%  . CESAREAN SECTION    . CHOLECYSTECTOMY    . CORONARY ANGIOPLASTY WITH STENT PLACEMENT     LAD  . ectopic pregnancies requiring laparotomies    . LEAD REMOVAL Left 08/14/2014   Procedure: CRYO INTERCOSTAL NERVE BLOCK;  Surgeon: Melrose Nakayama, MD;  Location: [facility];  Service: Thoracic;  Laterality: Left;  . LOBECTOMY Left 08/14/2014   Procedure: LEFT LOWER LOBECTOMY;  Surgeon: Melrose Nakayama, MD;  Location: [facility];  Service: Thoracic;  Laterality: Left;  . NODE DISSECTION Left 08/14/2014   Procedure: NODE DISSECTION, LEFT LOWER LOBE LUNG;  Surgeon: Melrose Nakayama, MD;  Location: [facility];  Service: Thoracic;  Laterality: Left;  . SHOULDER SURGERY     LEFT X 2   (REMOVED SOME COLLAR BONE )  . TRANSTHORACIC ECHOCARDIOGRAM  10/17/2007  . TUBAL LIGATION    . US ECHOCARDIOGRAPHY  01/07/2008   EF 55-60%  . VIDEO ASSISTED THORACOSCOPY (VATS)/WEDGE RESECTION Left 08/14/2014   Procedure: VIDEO ASSISTED THORACOSCOPY (VATS)/WEDGE RESECTION;  Surgeon: Melrose Nakayama, MD;  Location: [facility];  Service: Thoracic;  Laterality: Left;    REVIEW OF SYSTEMS:  A comprehensive review of systems was negative.   PHYSICAL EXAMINATION: General appearance: alert, cooperative and no distress Head: Normocephalic, without obvious abnormality, atraumatic Neck: no adenopathy, no JVD, supple, symmetrical, trachea midline and thyroid not enlarged, symmetric, no tenderness/mass/nodules Lymph nodes: Cervical, supraclavicular, and axillary nodes normal. Resp: clear to auscultation bilaterally Back: negative Cardio: regular rate and rhythm, S1, S2 normal, no murmur, click, rub or gallop GI: soft, non-tender; bowel sounds normal; no masses,  no organomegaly Extremities: extremities normal, atraumatic, no cyanosis or edema  ECOG  PERFORMANCE STATUS: 1 - Symptomatic but completely ambulatory  Blood pressure 132/62, pulse 78, temperature 97.9 F (36.6 C), temperature source Oral, resp. rate 19, weight 149 lb 3.2 oz (67.7 kg), SpO2 98 %.  LABORATORY DATA: Lab Results  Component Value Date   WBC 6.5 08/25/2017   HGB 16.0 (H) 08/25/2017   HCT 47.7 (H) 08/25/2017   MCV 97.3 08/25/2017   PLT 214 08/25/2017      Chemistry      Component Value Date/Time   NA 136 08/25/2017 0814   NA 139 05/25/2017 1344   K 3.8 08/25/2017 0814   K 3.5 05/25/2017 1344   CL 101 08/25/2017 0814   CO2 24 08/25/2017 0814   CO2 23 05/25/2017 1344   BUN 8 08/25/2017 0814   BUN 10.0 05/25/2017 1344   CREATININE 0.78 08/25/2017 0814   CREATININE 0.7 05/25/2017 1344      Component Value Date/Time   CALCIUM 9.9  08/25/2017 0814   CALCIUM 9.4 05/25/2017 1344   ALKPHOS 109 08/25/2017 0814   ALKPHOS 94 05/25/2017 1344   AST 23 08/25/2017 0814   AST 23 05/25/2017 1344   ALT 22 08/25/2017 0814   ALT 20 05/25/2017 1344   BILITOT 0.4 08/25/2017 0814   BILITOT 0.38 05/25/2017 1344       RADIOGRAPHIC STUDIES: Ct Chest W Contrast  Result Date: 08/25/2017 CLINICAL DATA:  Non-small-cell lung cancer. Status post left lower lobectomy 08/14/2014. Finished immunotherapy. EXAM: CT CHEST AND ABDOMEN WITH CONTRAST TECHNIQUE: Multidetector CT imaging of the chest and abdomen was performed following the standard protocol during bolus administration of intravenous contrast. CONTRAST:  142mL ISOVUE-300 IOPAMIDOL (ISOVUE-300) INJECTION 61%, 15mL ISOVUE-300 IOPAMIDOL (ISOVUE-300) INJECTION 61% COMPARISON:  05/25/2017 FINDINGS: CT CHEST FINDINGS Cardiovascular: Aortic and branch vessel atherosclerosis. Tortuous thoracic aorta. Upper normal ascending aortic size, similar at 3.9 cm on image 32/2. Normal heart size, without pericardial effusion. Left ventricular apical infarct, as evidenced by hypoattenuation. Multivessel coronary artery atherosclerosis. No  central pulmonary embolism, on this non-dedicated study. Mediastinum/Nodes: No supraclavicular adenopathy. 8 mm index right paratracheal node is unchanged on image 17/2. No hilar adenopathy. Prevascular node is similar in size, 11 mm on image 25/2. Lungs/Pleura: No pleural fluid. Moderate centrilobular and paracentral emphysema. Volume loss likely related to scarring within the anteromedial right middle lobe. Left lower lobectomy. Similar soft tissue thickening along the anteromedial aspect of the left upper lobe, including on image 29/4. Unchanged back to 11/17/2015. Left upper lobe surgical sutures. Similar inferior left upper lobe scarring Musculoskeletal: No acute osseous abnormality. CT ABDOMEN FINDINGS Hepatobiliary: Normal liver. Cholecystectomy, without biliary ductal dilatation. Pancreas: Normal, without mass or ductal dilatation. Spleen: Normal in size, without focal abnormality. Adrenals/Urinary Tract: Normal adrenal glands. Normal kidneys, without hydronephrosis. Stomach/Bowel: Normal stomach, without wall thickening. Colonic stool burden suggests constipation. Normal abdominal small bowel loops. Vascular/Lymphatic: Advanced aortic and branch vessel atherosclerosis. No retroperitoneal or retrocrural adenopathy. Other: No ascites.  No evidence of omental or peritoneal disease. Musculoskeletal: Degenerate disc disease at the lumbosacral junction. Mild osteopenia. IMPRESSION: 1. Similar mild mediastinal adenopathy. Otherwise, no evidence of metastatic disease in the chest/abdomen. 2. Status post left lower lobectomy. 3. Age advanced coronary artery atherosclerosis. Recommend assessment of coronary risk factors and consideration of medical therapy. 4. Aortic atherosclerosis (ICD10-I70.0) and emphysema (ICD10-J43.9). 5. Upper normal ascending aortic size, warranting follow-up attention. Electronically Signed   By: Abigail Miyamoto M.D.   On: 08/25/2017 10:33   Ct Abdomen W Contrast  Result Date:  08/25/2017 CLINICAL DATA:  Non-small-cell lung cancer. Status post left lower lobectomy 08/14/2014. Finished immunotherapy. EXAM: CT CHEST AND ABDOMEN WITH CONTRAST TECHNIQUE: Multidetector CT imaging of the chest and abdomen was performed following the standard protocol during bolus administration of intravenous contrast. CONTRAST:  121mL ISOVUE-300 IOPAMIDOL (ISOVUE-300) INJECTION 61%, 63mL ISOVUE-300 IOPAMIDOL (ISOVUE-300) INJECTION 61% COMPARISON:  05/25/2017 FINDINGS: CT CHEST FINDINGS Cardiovascular: Aortic and branch vessel atherosclerosis. Tortuous thoracic aorta. Upper normal ascending aortic size, similar at 3.9 cm on image 32/2. Normal heart size, without pericardial effusion. Left ventricular apical infarct, as evidenced by hypoattenuation. Multivessel coronary artery atherosclerosis. No central pulmonary embolism, on this non-dedicated study. Mediastinum/Nodes: No supraclavicular adenopathy. 8 mm index right paratracheal node is unchanged on image 17/2. No hilar adenopathy. Prevascular node is similar in size, 11 mm on image 25/2. Lungs/Pleura: No pleural fluid. Moderate centrilobular and paracentral emphysema. Volume loss likely related to scarring within the anteromedial right middle lobe. Left lower lobectomy. Similar soft  tissue thickening along the anteromedial aspect of the left upper lobe, including on image 29/4. Unchanged back to 11/17/2015. Left upper lobe surgical sutures. Similar inferior left upper lobe scarring Musculoskeletal: No acute osseous abnormality. CT ABDOMEN FINDINGS Hepatobiliary: Normal liver. Cholecystectomy, without biliary ductal dilatation. Pancreas: Normal, without mass or ductal dilatation. Spleen: Normal in size, without focal abnormality. Adrenals/Urinary Tract: Normal adrenal glands. Normal kidneys, without hydronephrosis. Stomach/Bowel: Normal stomach, without wall thickening. Colonic stool burden suggests constipation. Normal abdominal small bowel loops.  Vascular/Lymphatic: Advanced aortic and branch vessel atherosclerosis. No retroperitoneal or retrocrural adenopathy. Other: No ascites.  No evidence of omental or peritoneal disease. Musculoskeletal: Degenerate disc disease at the lumbosacral junction. Mild osteopenia. IMPRESSION: 1. Similar mild mediastinal adenopathy. Otherwise, no evidence of metastatic disease in the chest/abdomen. 2. Status post left lower lobectomy. 3. Age advanced coronary artery atherosclerosis. Recommend assessment of coronary risk factors and consideration of medical therapy. 4. Aortic atherosclerosis (ICD10-I70.0) and emphysema (ICD10-J43.9). 5. Upper normal ascending aortic size, warranting follow-up attention. Electronically Signed   By: Abigail Miyamoto M.D.   On: 08/25/2017 10:33    ASSESSMENT AND PLAN:  This is a very pleasant 57 years old white female with recurrent non-small cell lung cancer, adenocarcinoma with positive PDL 1 expression of 60%. The patient completed treatment with immunotherapy with Ketruda 200 mg IV every 3 weeks status post 35 cycles. The patient tolerated the previous course of her treatment fairly well. Repeat CT scan of the chest and abdomen showed no concerning findings for disease progression. I discussed the scan results with the patient and recommended for her to continue in observation with repeat CT scan of the chest in 3 months. For smoking cessation, I strongly encouraged the patient to quit smoking and offered her a smoke cessation program. For the hypothyroidism, she will continue her routine follow-up visit and evaluation by her primary care physician. She was advised to call immediately if she has any concerning symptoms in the interval. The patient voices understanding of current disease status and treatment options and is in agreement with the current care plan. All questions were answered. The patient knows to call the clinic with any problems, questions or concerns. We can certainly  see the patient much sooner if necessary.  Disclaimer: This note was dictated with voice recognition software. Similar sounding words can inadvertently be transcribed and may not be corrected upon review.

## 2017-08-28 NOTE — Progress Notes (Signed)
Cardiology Office Note:    Date:  08/28/2017   ID:  Sharon [name], DOB 1960-09-22, MRN [id number]  PCP:  [name], NP  Cardiologist:  Mertie Moores, MD  Oncologist: Dr. Curt Bears  Referring MD: [name], NP   Chief Complaint  Patient presents with  . Follow-up    CAD    History of Present Illness:    Sharon [name] is a 57 y.o. female with CAD status post anterior STEMI in 09/2007 treated with DES 2 to the LAD, carotid artery disease, HL, tobacco abuse,  recurrent non-small cell lung CA status post lobectomy, hypothyroidism.  Last seen April 2018.  Sharon [name] returns for follow-up.  She is here alone.  She does note dyspnea with some activities.  This has been chronic since her lobectomy.  However, she does feel as though it may be getting worse.  She denies orthopnea or PND.  She denies significant edema.  She denies chest discomfort or syncope.  Prior CV studies:   The following studies were reviewed today:  Carotid US 09/23/16 Bilateral ICA 1-39  Echo 09/23/16 Mild LVH, EF 55-60, normal wall motion, grade 1 diastolic dysfunction, moderate AI  Carotid US 8/15 R 40-59; L1-39 >> follow-up 1 year  Echo 8/09 EF 55-60, impaired relaxation, mild aortic stenosis (mean 8.1), moderate aortic insufficiency, trace MR, trace TR  LHC 5/09 LM normal LAD proximal 99; branching DX with one branch occluded and the other severely diseased LCx okay with irregularities in the OM1 RCA proximal occluded-CTO EF 30-40, anterior wall HK PCI: 3 x 18 mm Promus DES, 2.5 x 15 mm Promus DES to the LAD  Past Medical History:  Diagnosis Date  . Aortic insufficiency    Echo 8/09:  EF 55-60, impaired relaxation, mild aortic stenosis (mean 8.1), moderate aortic insufficiency, trace MR, trace TR // Echo 4/18: mild LVH, EF 55-60, no RWMA, Gr 1 DD, mod AI   . Arthritis   . Carotid artery disease ([facility]) 03/09/2011   Carotid US 8/15 - R 40-59; L1-39 >> follow-up 1 year // Carotid US  4/18: ICA 1-39 bilaterally - repeat 1 yr  . COPD (chronic obstructive pulmonary disease) ([facility])   . Coronary artery disease involving native coronary artery of native heart without angina pectoris 11/24/2009   s/p ant STEMI 5/09 >> LHC 5/09 - LM normal; LAD proximal 99; branching DX with one branch occluded and the other severely diseased; LCx okay with irregularities in the OM1; RCA proximal occluded-CTO; EF 30-40, anterior wall HK  >>  PCI: 3 x 18 mm Promus DES, 2.5 x 15 mm Promus DES to the LAD  . Emphysema of lung ([facility])   . Headache 09/06/2016  . History of acute anterior wall MI 12/2007   s/p DES x 2 to LAD  . Hyperlipidemia   . Ischemic cardiomyopathy   . Non-small cell lung cancer ([facility])    per CT CHEST/PET 2/4 and 07/18/14 @ [facility]  . Pneumonia    2015   HX BRONCHITIS    . Tobacco user    Surgical Hx: The patient  has a past surgical history that includes Cholecystectomy; Tubal ligation; ectopic pregnancies requiring laparotomies; Cardiac catheterization (10/15/2007); Coronary angioplasty with stent; US ECHOCARDIOGRAPHY (01/07/2008); transthoracic echocardiogram (10/17/2007); Cesarean section; Shoulder surgery; Video assisted thoracoscopy (vats)/wedge resection (Left, 08/14/2014); Lobectomy (Left, 08/14/2014); Lead removal (Left, 08/14/2014); and Node dissection (Left, 08/14/2014).   Current Medications: Current Meds  Medication Sig  . aspirin (ASPIR-LOW) 81 MG  EC tablet Take 81 mg by mouth daily. On hold  . buPROPion (WELLBUTRIN SR) 150 MG 12 hr tablet Take 150 mg by mouth 2 (two) times daily.  . clopidogrel (PLAVIX) 75 MG tablet TAKE 1 TABLET BY MOUTH EVERY DAY  . ezetimibe (ZETIA) 10 MG tablet Take 1 tablet (10 mg total) by mouth daily.  Marland Kitchen gabapentin (NEURONTIN) 100 MG capsule TAKE 1-3 CAP(S) ORALLY 3 TIMES A DAY FOR 30 DAY(S)  . levothyroxine (SYNTHROID, LEVOTHROID) 100 MCG tablet TAKE 1 TAB(S) ORALLY ONCE A DAY FOR 45 DAY(S)-RECHECK LABS 4-6 WEEKS BEFORE NEXT REFILL  . Magnesium 250 MG  TABS Take 250 mg by mouth daily.  . ondansetron (ZOFRAN) 8 MG tablet TAKE 1 TABLET (8 MG TOTAL) BY MOUTH EVERY 8 (EIGHT) HOURS AS NEEDED FOR NAUSEA OR VOMITING.  . Pembrolizumab (KEYTRUDA IV) TAKE KEYTRUDA  IV INFUSION ONCE EVERY 3 WEEKS FOR CHEMO( PT. NOT SURE OF DOSAGE)  . rosuvastatin (CRESTOR) 40 MG tablet Take 1 tablet (40 mg total) by mouth daily.  . traMADol (ULTRAM) 50 MG tablet Take 50 mg by mouth every 6 (six) hours as needed for moderate pain or severe pain.     Allergies:   Codeine; Lipitor [atorvastatin calcium]; Morphine and related; and Oxycodone-acetaminophen   Social History   Tobacco Use  . Smoking status: Current Every Day Smoker    Packs/day: 1.00    Years: 36.00    Pack years: 36.00    Types: Cigarettes    Last attempt to quit: 08/07/2014    Years since quitting: 3.0  . Smokeless tobacco: Never Used  . Tobacco comment: smokes 1/2 pk day  Substance Use Topics  . Alcohol use: Yes    Comment: occasional    (RARELY)  . Drug use: No     Family Hx: The patient's family history includes Cancer in her father and sister; Diabetes in her mother; Hypertension in her mother; Obesity in her sister.  ROS:   Please see the history of present illness.    Review of Systems  Cardiovascular: Positive for dyspnea on exertion.  Hematologic/Lymphatic: Bruises/bleeds easily.   All other systems reviewed and are negative.   EKGs/Labs/Other Test Reviewed:    EKG:  EKG is  ordered today.  The ekg ordered today demonstrates normal sinus rhythm, heart rate 69, normal axis, QTC 447, no change from prior tracing  Recent Labs: 08/25/2017: ALT 22; BUN 8; Creatinine, Ser 0.78; Hemoglobin 16.0; Platelets 214; Potassium 3.8; Sodium 136; TSH <0.080   Recent Lipid Panel Lab Results  Component Value Date/Time   CHOL 163 01/09/2017 07:49 AM   TRIG 72 01/09/2017 07:49 AM   HDL 52 01/09/2017 07:49 AM   CHOLHDL 3.1 01/09/2017 07:49 AM   CHOLHDL 6 04/28/2014 07:41 AM   LDLCALC 97  01/09/2017 07:49 AM   LDLDIRECT 140.7 06/06/2011 09:18 AM    Physical Exam:    VS:  BP (!) 120/56   Pulse 69   Ht 5\' 4"  (1.626 m)   Wt 150 lb 1.9 oz (68.1 kg)   SpO2 96%   BMI 25.77 kg/m     Wt Readings from Last 3 Encounters:  08/28/17 150 lb 1.9 oz (68.1 kg)  08/28/17 149 lb 3.2 oz (67.7 kg)  05/29/17 145 lb 3.2 oz (65.9 kg)     Physical Exam  Constitutional: She is oriented to person, place, and time. She appears well-developed and well-nourished. No distress.  HENT:  Head: Normocephalic and atraumatic.  Neck: No JVD present.  Cardiovascular: Normal rate and regular rhythm.  Murmur heard.  Holodiastolic murmur is present with a grade of 2/6 at the lower left sternal border. Pulmonary/Chest: Effort normal. She has no rales.  Abdominal: Soft.  Musculoskeletal: She exhibits no edema.  Neurological: She is alert and oriented to person, place, and time.  Skin: Skin is warm and dry.    ASSESSMENT & PLAN:    #1.  Coronary artery disease involving native coronary artery of native heart without angina pectoris Status post anterior MI in 2009 treated with drug-eluting stent to LAD x2.  She denies anginal symptoms.  Continue aspirin, statin, Plavix.  #2.  Bilateral carotid artery disease, unspecified type ([facility]) Will arrange follow up carotid US in 08/2017.  #3.  Aortic valve insufficiency, etiology of cardiac valve disease unspecified She does not some increased shortness of breath.  Her shortness of breath is most likely related to prior lobectomy, ongoing tobacco abuse and lung cancer.  However, she is due for follow up echocardiogram.  Will arrange later this month.  #4.  Thoracic ascending aortic aneurysm ([facility]) 40 mm by CT in 04/2017.  She gets serial CT scans (every 3 mos with oncology).  We can monitor this through her scans with oncology. We discussed starting on low dose beta-blocker therapy.  She is willing to try metoprolol succinate.  -Start metoprolol succinate 25  mg daily  #5.  Hyperlipidemia, unspecified hyperlipidemia type She is trying to get Zetia approved.  Recent LDL > 70.  Continue crestor.  #6.  TOBACCO ABUSE She is trying to quit.   Dispo:  Return in about 6 months (around 02/27/2018) for Routine Follow Up, w/ Dr. Acie Fredrickson, or Richardson Dopp, PA-C.   Medication Adjustments/Labs and Tests Ordered: Current medicines are reviewed at length with the patient today.  Concerns regarding medicines are outlined above.  Tests Ordered: Orders Placed This Encounter  Procedures  . ECHOCARDIOGRAM COMPLETE   Medication Changes: Meds ordered this encounter  Medications  . metoprolol succinate (TOPROL XL) 25 MG 24 hr tablet    Sig: Take 1 tablet (25 mg total) by mouth daily.    Dispense:  90 tablet    Refill:  3    Signed, Richardson Dopp, PA-C  08/28/2017 3:21 PM    [facility] [address], [place], [place]  [address] Phone: [phone]; Fax: [phone]

## 2017-08-28 NOTE — Patient Instructions (Signed)
Medication Instructions:  1. START TOPROL XL 25 MG TAKE 1 TABLET DAILY; RX HAS BEEN SENT IN  Labwork: NONE ORDERED [facility]  Testing/Procedures: 1. Your physician has requested that you have an echocardiogram. Echocardiography is a painless test that uses sound waves to create images of your heart. It provides your doctor with information about the size and shape of your heart and how well your heart's chambers and valves are working. This procedure takes approximately one hour. There are no restrictions for this procedure. TO BE DONE IN MAY 2019 AT [facility] PER PT REQUEST   2. Your physician has requested that you have a carotid duplex. This test is an ultrasound of the carotid arteries in your neck. It looks at blood flow through these arteries that supply the brain with blood. Allow one hour for this exam. There are no restrictions or special instructions. TO BE DONE IN MAY 2019 AT [facility] PER PT REQUEST     Follow-Up: Your physician wants you to follow-up in: [place]. Acie Fredrickson.  You will receive a reminder letter in the mail two months in advance. If you don't receive a letter, please call our office to schedule the follow-up appointment.   Any Other Special Instructions Will Be Listed Below (If Applicable).  CALL THE OFFICE FOR A 3 MONTH LAB APPT AFTER YOU START THE ZETIA [phone]   If you need a refill on your cardiac medications before your next appointment, please call your pharmacy.

## 2017-08-28 NOTE — Telephone Encounter (Signed)
Printed avs and calender of upcoming appointments. Per 4/1 los. Did not give patient contrast/ unable to drink. Will come in early the day of her CT and and have radiology prefixed contrast.

## 2017-08-30 NOTE — Addendum Note (Signed)
Addended by: Briant Cedar on: 08/30/2017 08:31 AM   Modules accepted: Orders

## 2017-10-02 ENCOUNTER — Ambulatory Visit (HOSPITAL_BASED_OUTPATIENT_CLINIC_OR_DEPARTMENT_OTHER)
Admission: RE | Admit: 2017-10-02 | Discharge: 2017-10-02 | Disposition: A | Discharge status: Home / Self Care | Payer: BLUE CROSS/BLUE SHIELD | Source: Ambulatory Visit | Attending: Physician Assistant | Admitting: Physician Assistant

## 2017-10-02 ENCOUNTER — Ambulatory Visit (HOSPITAL_COMMUNITY)
Admission: RE | Admit: 2017-10-02 | Discharge: 2017-10-02 | Disposition: A | Discharge status: Home / Self Care | Payer: BLUE CROSS/BLUE SHIELD | Source: Ambulatory Visit | Attending: Physician Assistant | Admitting: Physician Assistant

## 2017-10-02 DIAGNOSIS — I351 Nonrheumatic aortic (valve) insufficiency: Secondary | ICD-10-CM | POA: Diagnosis not present

## 2017-10-02 DIAGNOSIS — I2510 Atherosclerotic heart disease of native coronary artery without angina pectoris: Secondary | ICD-10-CM | POA: Diagnosis not present

## 2017-10-02 DIAGNOSIS — I350 Nonrheumatic aortic (valve) stenosis: Secondary | ICD-10-CM | POA: Insufficient documentation

## 2017-10-02 DIAGNOSIS — I739 Peripheral vascular disease, unspecified: Principal | ICD-10-CM

## 2017-10-02 DIAGNOSIS — I779 Disorder of arteries and arterioles, unspecified: Secondary | ICD-10-CM

## 2017-10-02 DIAGNOSIS — J449 Chronic obstructive pulmonary disease, unspecified: Secondary | ICD-10-CM | POA: Diagnosis not present

## 2017-10-02 DIAGNOSIS — E785 Hyperlipidemia, unspecified: Secondary | ICD-10-CM | POA: Insufficient documentation

## 2017-10-02 DIAGNOSIS — I255 Ischemic cardiomyopathy: Secondary | ICD-10-CM | POA: Insufficient documentation

## 2017-10-02 NOTE — Progress Notes (Signed)
VASCULAR LAB PRELIMINARY  PRELIMINARY  PRELIMINARY  PRELIMINARY  Carotid duplex completed.    Preliminary report:  1-39% ICA stenosis. Vertebral artery flow is antegrade.  Tortuous ICA bilaterally.   Rutledge Selsor, RVT 10/02/2017, 2:07 PM

## 2017-10-02 NOTE — Progress Notes (Signed)
  Echocardiogram 2D Echocardiogram has been performed.  Jennette Dubin 10/02/2017, 1:53 PM

## 2017-10-04 ENCOUNTER — Encounter: Payer: Self-pay | Admitting: Physician Assistant

## 2017-10-04 ENCOUNTER — Encounter: Payer: Self-pay | Admitting: *Deleted

## 2017-11-08 ENCOUNTER — Other Ambulatory Visit: Payer: Self-pay | Admitting: Cardiovascular Disease

## 2017-11-19 ENCOUNTER — Other Ambulatory Visit: Payer: Self-pay | Admitting: Physician Assistant

## 2017-11-19 DIAGNOSIS — E785 Hyperlipidemia, unspecified: Secondary | ICD-10-CM

## 2017-11-27 ENCOUNTER — Inpatient Hospital Stay: Payer: BLUE CROSS/BLUE SHIELD | Attending: Internal Medicine

## 2017-11-27 ENCOUNTER — Encounter (HOSPITAL_COMMUNITY): Payer: Self-pay

## 2017-11-27 ENCOUNTER — Ambulatory Visit (HOSPITAL_COMMUNITY)
Admission: RE | Admit: 2017-11-27 | Discharge: 2017-11-27 | Disposition: A | Discharge status: Home / Self Care | Payer: BLUE CROSS/BLUE SHIELD | Source: Ambulatory Visit | Attending: Internal Medicine | Admitting: Internal Medicine

## 2017-11-27 DIAGNOSIS — J438 Other emphysema: Secondary | ICD-10-CM | POA: Insufficient documentation

## 2017-11-27 DIAGNOSIS — Z720 Tobacco use: Secondary | ICD-10-CM | POA: Diagnosis not present

## 2017-11-27 DIAGNOSIS — C3490 Malignant neoplasm of unspecified part of unspecified bronchus or lung: Secondary | ICD-10-CM | POA: Insufficient documentation

## 2017-11-27 DIAGNOSIS — Z9889 Other specified postprocedural states: Secondary | ICD-10-CM | POA: Insufficient documentation

## 2017-11-27 DIAGNOSIS — E039 Hypothyroidism, unspecified: Secondary | ICD-10-CM | POA: Diagnosis not present

## 2017-11-27 DIAGNOSIS — I700 Atherosclerosis of aorta: Secondary | ICD-10-CM | POA: Insufficient documentation

## 2017-11-27 DIAGNOSIS — J432 Centrilobular emphysema: Secondary | ICD-10-CM | POA: Diagnosis not present

## 2017-11-27 DIAGNOSIS — C3432 Malignant neoplasm of lower lobe, left bronchus or lung: Secondary | ICD-10-CM | POA: Diagnosis present

## 2017-11-27 LAB — CBC WITH DIFFERENTIAL (CANCER CENTER ONLY)
BASOS PCT: 0 %
Basophils Absolute: 0 10*3/uL (ref 0.0–0.1)
EOS ABS: 0.1 10*3/uL (ref 0.0–0.5)
EOS PCT: 2 %
HCT: 43.2 % (ref 34.8–46.6)
HEMOGLOBIN: 14.5 g/dL (ref 11.6–15.9)
Lymphocytes Relative: 33 %
Lymphs Abs: 2.2 10*3/uL (ref 0.9–3.3)
MCH: 32.7 pg (ref 25.1–34.0)
MCHC: 33.6 g/dL (ref 31.5–36.0)
MCV: 97.3 fL (ref 79.5–101.0)
Monocytes Absolute: 0.4 10*3/uL (ref 0.1–0.9)
Monocytes Relative: 7 %
NEUTROS PCT: 58 %
Neutro Abs: 3.9 10*3/uL (ref 1.5–6.5)
PLATELETS: 196 10*3/uL (ref 145–400)
RBC: 4.44 MIL/uL (ref 3.70–5.45)
RDW: 14.7 % — ABNORMAL HIGH (ref 11.2–14.5)
WBC Count: 6.7 10*3/uL (ref 3.9–10.3)

## 2017-11-27 LAB — CMP (CANCER CENTER ONLY)
ALBUMIN: 3.6 g/dL (ref 3.5–5.0)
ALT: 18 U/L (ref 0–44)
ANION GAP: 7 (ref 5–15)
AST: 23 U/L (ref 15–41)
Alkaline Phosphatase: 106 U/L (ref 38–126)
BUN: 8 mg/dL (ref 6–20)
CHLORIDE: 106 mmol/L (ref 98–111)
CO2: 26 mmol/L (ref 22–32)
Calcium: 9.6 mg/dL (ref 8.9–10.3)
Creatinine: 0.76 mg/dL (ref 0.44–1.00)
GFR, Est AFR Am: >60 mL/min (ref >60)
GFR, Estimated: >60 mL/min (ref >60)
GLUCOSE: 88 mg/dL (ref 70–99)
Potassium: 4.1 mmol/L (ref 3.5–5.1)
SODIUM: 139 mmol/L (ref 135–145)
Total Bilirubin: 0.4 mg/dL (ref 0.3–1.2)
Total Protein: 6.8 g/dL (ref 6.5–8.1)

## 2017-11-27 LAB — RESEARCH LABS

## 2017-11-27 MED ORDER — IOPAMIDOL (ISOVUE-300) INJECTION 61%
100.0000 mL | Freq: Once | INTRAVENOUS | Status: AC | PRN
  Administered 2017-11-27: 100 mL via INTRAVENOUS

## 2017-11-27 MED ORDER — IOPAMIDOL (ISOVUE-300) INJECTION 61%
INTRAVENOUS | Status: AC
  Filled 2017-11-27: qty 30

## 2017-11-27 MED ORDER — IOPAMIDOL (ISOVUE-300) INJECTION 61%
INTRAVENOUS | Status: AC
  Filled 2017-11-27: qty 100

## 2017-11-27 MED ORDER — IOPAMIDOL (ISOVUE-300) INJECTION 61%
30.0000 mL | Freq: Once | INTRAVENOUS | Status: AC | PRN
  Administered 2017-11-27: 30 mL via ORAL

## 2017-11-29 ENCOUNTER — Inpatient Hospital Stay (HOSPITAL_BASED_OUTPATIENT_CLINIC_OR_DEPARTMENT_OTHER): Payer: BLUE CROSS/BLUE SHIELD | Admitting: Internal Medicine

## 2017-11-29 ENCOUNTER — Encounter: Payer: Self-pay | Admitting: Internal Medicine

## 2017-11-29 ENCOUNTER — Telehealth: Payer: Self-pay | Admitting: Internal Medicine

## 2017-11-29 VITALS — BP 124/64 | HR 66 | Temp 98.2°F | Resp 18 | Ht 60.0 in | Wt 138.8 lb

## 2017-11-29 DIAGNOSIS — E039 Hypothyroidism, unspecified: Secondary | ICD-10-CM

## 2017-11-29 DIAGNOSIS — C3432 Malignant neoplasm of lower lobe, left bronchus or lung: Secondary | ICD-10-CM

## 2017-11-29 DIAGNOSIS — Z720 Tobacco use: Secondary | ICD-10-CM | POA: Diagnosis not present

## 2017-11-29 DIAGNOSIS — C3490 Malignant neoplasm of unspecified part of unspecified bronchus or lung: Secondary | ICD-10-CM

## 2017-11-29 NOTE — Telephone Encounter (Signed)
Appointments scheduled AVS/Calendar printed per 7/3 los °

## 2017-11-29 NOTE — Progress Notes (Signed)
[facility] Telephone:([phone]) [phone]   Fax:(336) [place], [place] [address]  DIAGNOSIS: Recurrent non-small cell lung cancer, adenocarcinoma with PDL 1 expression of 60% diagnosed in November 2016. The patient was initially diagnosed as stage IB (T2a, N0, M0) adenocarcinoma in March 2016.  PRIOR THERAPY:   1) Status post left video-assisted thoracoscopy, wedge resection left lower lobe, thoracoscopic left lower lobectomy and mediastinal lymph node dissection on 08/14/2014.  2) Immunotherapy with Ketruda (pembrolizumab) 200 mg IV every 3 weeks status post 35 cycles. First dose was given 05/12/2015.  This was discontinued after the patient completed 2 years of treatment.  CURRENT THERAPY: Observation.   INTERVAL HISTORY: Sharon [name] 57 y.o. female returns to the clinic today for 3 months follow-up visit.  The patient is feeling fine today with no specific complaints.  She intentionally lost around 10 pounds since her last visit.  She is more active.  She denied having any chest pain, shortness of breath, cough or hemoptysis.  She denied having any fever or chills.  She has no nausea, vomiting, diarrhea or constipation.  She denied having any headache or visual changes.  The patient had repeat CT scan of the chest performed recently and she is here for evaluation and discussion of her risk her results.   MEDICAL HISTORY: Past Medical History:  Diagnosis Date  . Aortic insufficiency    Echo 8/09:  EF 55-60, impaired relaxation, mild aortic stenosis (mean 8.1), moderate aortic insufficiency, trace MR, trace TR // Echo 4/18: mild LVH, EF 55-60, no RWMA, Gr 1 DD, mod AI // Echo 5/19: EF 62-22, normal diastolic function, mild aortic stenosis (mean 16, peak 30), moderate aortic insufficiency   . Arthritis   . Carotid artery disease ([place]) 03/09/2011   Carotid US 8/15 - R 40-59; L1-39 >> follow-up 1 year // Carotid US  4/18: ICA 1-39 bilaterally - repeat 1 yr// Carotid US 5/19: Bilateral ICA 1-39  . COPD (chronic obstructive pulmonary disease) ([place])   . Coronary artery disease involving native coronary artery of native heart without angina pectoris 11/24/2009   s/p ant STEMI 5/09 >> LHC 5/09 - LM normal; LAD proximal 99; branching DX with one branch occluded and the other severely diseased; LCx okay with irregularities in the OM1; RCA proximal occluded-CTO; EF 30-40, anterior wall HK  >>  PCI: 3 x 18 mm Promus DES, 2.5 x 15 mm Promus DES to the LAD  . Emphysema of lung ([place])   . Headache 09/06/2016  . History of acute anterior wall MI 12/2007   s/p DES x 2 to LAD  . Hyperlipidemia   . Ischemic cardiomyopathy   . Non-small cell lung cancer ([facility])    per CT CHEST/PET 2/4 and 07/18/14 @ [facility]  . Pneumonia    2015   HX BRONCHITIS    . Tobacco user     ALLERGIES:  is allergic to codeine; lipitor [atorvastatin calcium]; morphine and related; and oxycodone-acetaminophen.  MEDICATIONS:  Current Outpatient Medications  Medication Sig Dispense Refill  . aspirin (ASPIR-LOW) 81 MG EC tablet Take 81 mg by mouth daily. On hold    . buPROPion (WELLBUTRIN SR) 150 MG 12 hr tablet Take 150 mg by mouth 2 (two) times daily.  0  . clopidogrel (PLAVIX) 75 MG tablet TAKE 1 TABLET BY MOUTH EVERY DAY 90 tablet 2  . ezetimibe (ZETIA) 10 MG tablet Take 1  tablet (10 mg total) by mouth daily. 90 tablet 3  . gabapentin (NEURONTIN) 100 MG capsule TAKE 1-3 CAP(S) ORALLY 3 TIMES A DAY FOR 30 DAY(S)  0  . levothyroxine (SYNTHROID, LEVOTHROID) 100 MCG tablet TAKE 1 TAB(S) ORALLY ONCE A DAY FOR 45 DAY(S)-RECHECK LABS 4-6 WEEKS BEFORE NEXT REFILL  0  . Magnesium 250 MG TABS Take 250 mg by mouth daily.    . metoprolol succinate (TOPROL XL) 25 MG 24 hr tablet Take 1 tablet (25 mg total) by mouth daily. 90 tablet 3  . ondansetron (ZOFRAN) 8 MG tablet TAKE 1 TABLET (8 MG TOTAL) BY MOUTH EVERY 8 (EIGHT) HOURS AS NEEDED FOR NAUSEA OR VOMITING.  30 tablet 0  . Pembrolizumab (KEYTRUDA IV) TAKE KEYTRUDA  IV INFUSION ONCE EVERY 3 WEEKS FOR CHEMO( PT. NOT SURE OF DOSAGE)    . rosuvastatin (CRESTOR) 40 MG tablet TAKE 1 TABLET BY MOUTH EVERY DAY 90 tablet 3  . traMADol (ULTRAM) 50 MG tablet Take 50 mg by mouth every 6 (six) hours as needed for moderate pain or severe pain.     No current facility-administered medications for this visit.     SURGICAL HISTORY:  Past Surgical History:  Procedure Laterality Date  . CARDIAC CATHETERIZATION  10/15/2007   EF 30-40%  . CESAREAN SECTION    . CHOLECYSTECTOMY    . CORONARY ANGIOPLASTY WITH STENT PLACEMENT     LAD  . ectopic pregnancies requiring laparotomies    . LEAD REMOVAL Left 08/14/2014   Procedure: CRYO INTERCOSTAL NERVE BLOCK;  Surgeon: Melrose Nakayama, MD;  Location: [facility];  Service: Thoracic;  Laterality: Left;  . LOBECTOMY Left 08/14/2014   Procedure: LEFT LOWER LOBECTOMY;  Surgeon: Melrose Nakayama, MD;  Location: [facility];  Service: Thoracic;  Laterality: Left;  . NODE DISSECTION Left 08/14/2014   Procedure: NODE DISSECTION, LEFT LOWER LOBE LUNG;  Surgeon: Melrose Nakayama, MD;  Location: [facility];  Service: Thoracic;  Laterality: Left;  . SHOULDER SURGERY     LEFT X 2   (REMOVED SOME COLLAR BONE )  . TRANSTHORACIC ECHOCARDIOGRAM  10/17/2007  . TUBAL LIGATION    . US ECHOCARDIOGRAPHY  01/07/2008   EF 55-60%  . VIDEO ASSISTED THORACOSCOPY (VATS)/WEDGE RESECTION Left 08/14/2014   Procedure: VIDEO ASSISTED THORACOSCOPY (VATS)/WEDGE RESECTION;  Surgeon: Melrose Nakayama, MD;  Location: [facility];  Service: Thoracic;  Laterality: Left;    REVIEW OF SYSTEMS:  A comprehensive review of systems was negative.   PHYSICAL EXAMINATION: General appearance: alert, cooperative and no distress Head: Normocephalic, without obvious abnormality, atraumatic Neck: no adenopathy, no JVD, supple, symmetrical, trachea midline and thyroid not enlarged, symmetric, no tenderness/mass/nodules Lymph  nodes: Cervical, supraclavicular, and axillary nodes normal. Resp: clear to auscultation bilaterally Back: negative Cardio: regular rate and rhythm, S1, S2 normal, no murmur, click, rub or gallop GI: soft, non-tender; bowel sounds normal; no masses,  no organomegaly Extremities: extremities normal, atraumatic, no cyanosis or edema  ECOG PERFORMANCE STATUS: 1 - Symptomatic but completely ambulatory  Blood pressure 124/64, pulse 66, temperature 98.2 F (36.8 C), temperature source Oral, resp. rate 18, height 5' (1.524 m), weight 138 lb 12.8 oz (63 kg), SpO2 99 %.  LABORATORY DATA: Lab Results  Component Value Date   WBC 6.7 11/27/2017   HGB 14.5 11/27/2017   HCT 43.2 11/27/2017   MCV 97.3 11/27/2017   PLT 196 11/27/2017      Chemistry      Component Value Date/Time   NA  139 11/27/2017 0838   NA 139 05/25/2017 1344   K 4.1 11/27/2017 0838   K 3.5 05/25/2017 1344   CL 106 11/27/2017 0838   CO2 26 11/27/2017 0838   CO2 23 05/25/2017 1344   BUN 8 11/27/2017 0838   BUN 10.0 05/25/2017 1344   CREATININE 0.76 11/27/2017 0838   CREATININE 0.7 05/25/2017 1344      Component Value Date/Time   CALCIUM 9.6 11/27/2017 0838   CALCIUM 9.4 05/25/2017 1344   ALKPHOS 106 11/27/2017 0838   ALKPHOS 94 05/25/2017 1344   AST 23 11/27/2017 0838   AST 23 05/25/2017 1344   ALT 18 11/27/2017 0838   ALT 20 05/25/2017 1344   BILITOT 0.4 11/27/2017 0838   BILITOT 0.38 05/25/2017 1344       RADIOGRAPHIC STUDIES: Ct Chest W Contrast  Result Date: 11/27/2017 CLINICAL DATA:  Recurrent non-small cell (adenocarcinoma) lung cancer diagnosed in March 2016. Left lower lobe wedge resection 08/14/2014 with subsequent immunotherapy. EXAM: CT CHEST AND ABDOMEN WITH CONTRAST TECHNIQUE: Multidetector CT imaging of the chest and abdomen was performed following the standard protocol during bolus administration of intravenous contrast. CONTRAST:  146mL ISOVUE-300 IOPAMIDOL (ISOVUE-300) INJECTION 61% COMPARISON:   CT 08/25/2017 and 05/25/2017. FINDINGS: CT CHEST FINDINGS Cardiovascular: Diffuse atherosclerosis of the aorta, great vessels and coronary arteries. No acute vascular findings are seen. There are aortic valvular calcifications. Left ventricular apical infarct again noted. The heart size is normal. There is no pericardial effusion. Mediastinum/Nodes: There are stable prominent mediastinal lymph nodes, including an 8 mm right paratracheal node on image 15/2 and a 10 mm prevascular node on image 23/2. There is no axillary, supraclavicular or hilar adenopathy. The thyroid gland, trachea and esophagus demonstrate no significant findings. Lungs/Pleura: There is no pleural effusion. Status post left lower lobe resection. Surgical clips are present anteromedially in the left upper lobe. There is stable scarring posteriorly at the left lung base. No new or suspicious pulmonary nodule. There is moderate centrilobular and paraseptal emphysema. Musculoskeletal/Chest wall: No chest wall mass or suspicious osseous findings. Stable Schmorl's nodes in the lower thoracic spine. CT ABDOMEN FINDINGS Hepatobiliary: The liver is normal in density without focal abnormality. No significant biliary dilatation status post cholecystectomy. Pancreas: Unremarkable. No pancreatic ductal dilatation or surrounding inflammatory changes. Spleen: Normal in size without focal abnormality. Adrenals/Urinary Tract: Both adrenal glands appear normal. Both kidneys appear normal without hydronephrosis. Stomach/Bowel: No evidence of bowel wall thickening, distention or surrounding inflammatory change. Vascular/Lymphatic: There are no enlarged abdominal or pelvic lymph nodes. Advanced aortic and branch vessel atherosclerosis with stable mild dilatation of the distal abdominal aorta. Other: The visualized anterior abdominal wall appears intact. No ascites or peritoneal nodularity. Musculoskeletal: No acute or significant osseous findings. IMPRESSION: 1.  Stable examination without evidence of local recurrence or progressive metastatic disease. Mildly prominent mediastinal lymph nodes are stable. 2. Stable postsurgical changes in the left hemithorax with scattered scarring. No suspicious nodularity. 3. Aortic Atherosclerosis (ICD10-I70.0) and Emphysema (ICD10-J43.9). Electronically Signed   By: Richardean Sale M.D.   On: 11/27/2017 10:45   Ct Abdomen W Contrast  Result Date: 11/27/2017 CLINICAL DATA:  Recurrent non-small cell (adenocarcinoma) lung cancer diagnosed in March 2016. Left lower lobe wedge resection 08/14/2014 with subsequent immunotherapy. EXAM: CT CHEST AND ABDOMEN WITH CONTRAST TECHNIQUE: Multidetector CT imaging of the chest and abdomen was performed following the standard protocol during bolus administration of intravenous contrast. CONTRAST:  142mL ISOVUE-300 IOPAMIDOL (ISOVUE-300) INJECTION 61% COMPARISON:  CT 08/25/2017 and 05/25/2017. FINDINGS:  CT CHEST FINDINGS Cardiovascular: Diffuse atherosclerosis of the aorta, great vessels and coronary arteries. No acute vascular findings are seen. There are aortic valvular calcifications. Left ventricular apical infarct again noted. The heart size is normal. There is no pericardial effusion. Mediastinum/Nodes: There are stable prominent mediastinal lymph nodes, including an 8 mm right paratracheal node on image 15/2 and a 10 mm prevascular node on image 23/2. There is no axillary, supraclavicular or hilar adenopathy. The thyroid gland, trachea and esophagus demonstrate no significant findings. Lungs/Pleura: There is no pleural effusion. Status post left lower lobe resection. Surgical clips are present anteromedially in the left upper lobe. There is stable scarring posteriorly at the left lung base. No new or suspicious pulmonary nodule. There is moderate centrilobular and paraseptal emphysema. Musculoskeletal/Chest wall: No chest wall mass or suspicious osseous findings. Stable Schmorl's nodes in the  lower thoracic spine. CT ABDOMEN FINDINGS Hepatobiliary: The liver is normal in density without focal abnormality. No significant biliary dilatation status post cholecystectomy. Pancreas: Unremarkable. No pancreatic ductal dilatation or surrounding inflammatory changes. Spleen: Normal in size without focal abnormality. Adrenals/Urinary Tract: Both adrenal glands appear normal. Both kidneys appear normal without hydronephrosis. Stomach/Bowel: No evidence of bowel wall thickening, distention or surrounding inflammatory change. Vascular/Lymphatic: There are no enlarged abdominal or pelvic lymph nodes. Advanced aortic and branch vessel atherosclerosis with stable mild dilatation of the distal abdominal aorta. Other: The visualized anterior abdominal wall appears intact. No ascites or peritoneal nodularity. Musculoskeletal: No acute or significant osseous findings. IMPRESSION: 1. Stable examination without evidence of local recurrence or progressive metastatic disease. Mildly prominent mediastinal lymph nodes are stable. 2. Stable postsurgical changes in the left hemithorax with scattered scarring. No suspicious nodularity. 3. Aortic Atherosclerosis (ICD10-I70.0) and Emphysema (ICD10-J43.9). Electronically Signed   By: Richardean Sale M.D.   On: 11/27/2017 10:45    ASSESSMENT AND PLAN:  This is a very pleasant 57 years old white female with recurrent non-small cell lung cancer, adenocarcinoma with positive PDL 1 expression of 60%. The patient completed treatment with immunotherapy with Ketruda 200 mg IV every 3 weeks status post 35 cycles. The patient tolerated the previous course of her treatment fairly well. The patient is currently on observation and she is feeling fine. Repeat CT scan of the chest, abdomen and pelvis showed no concerning findings for disease progression. I discussed the scan results with the patient and recommended for her to continue in observation with repeat CT scan of the chest, abdomen  and pelvis in 3 months. For smoking cessation, I strongly encouraged the patient to quit smoking and offered her a smoke cessation program. For the hypothyroidism, she will continue her routine follow-up visit and evaluation by her primary care physician. The patient was advised to call immediately if she has any concerning symptoms in the interval. The patient voices understanding of current disease status and treatment options and is in agreement with the current care plan. All questions were answered. The patient knows to call the clinic with any problems, questions or concerns. We can certainly see the patient much sooner if necessary.  Disclaimer: This note was dictated with voice recognition software. Similar sounding words can inadvertently be transcribed and may not be corrected upon review.

## 2018-02-02 ENCOUNTER — Telehealth: Payer: Self-pay | Admitting: *Deleted

## 2018-02-02 ENCOUNTER — Telehealth: Payer: Self-pay | Admitting: Physician Assistant

## 2018-02-02 DIAGNOSIS — E785 Hyperlipidemia, unspecified: Secondary | ICD-10-CM

## 2018-02-02 MED ORDER — METOPROLOL SUCCINATE ER 25 MG PO TB24: 25.0000 mg | ORAL_TABLET | Freq: Every day | ORAL | 3 refills | Status: DC

## 2018-02-02 MED ORDER — EZETIMIBE 10 MG PO TABS: 10.0000 mg | ORAL_TABLET | Freq: Every day | ORAL | 3 refills | Status: DC

## 2018-02-02 NOTE — Telephone Encounter (Signed)
-----   Message from Liliane Shi, [place] sent at 02/02/2018  1:48 PM EDT ----- Labs received from PCP 01/30/18:  Hgb 14.7, K 4.2, Creatinine 0.82, ALT 20, HDL 51, LDL 90 LDL is still above goal (goal < 70). At last visit she was trying to get Zetia assistance. PLAN:   - Ask if she got assistance for Zetia.  If so, start Zetia 10 mg QD and repeat Lipids and LFTs in 3 mos.  - If not, refer to Lipid Clinic (if she is ok with that). Richardson Dopp, PA-C    02/02/2018 1:48 PM

## 2018-02-02 NOTE — Telephone Encounter (Signed)
See phone note

## 2018-02-02 NOTE — Telephone Encounter (Signed)
New Message        Patient returned cal

## 2018-02-02 NOTE — Telephone Encounter (Signed)
Pt has been notified of lab results. Pt states she has met her deductible and the Zetia should be $0 for her. She asked for me to send in the Rx. Pt also states to me that Richardson Dopp, PA was going to start her on a medication for her heart. I reviewed last ov note from PA 08/2017 and saw that he was starting Toprol XL 25 mg daily. Pt states she had not started the Toprol because the pharmacy was charging too much, said she was not paying that price. However now that she met her deductible ok to send this in as well. Pt asked what kind of side effects can be expected from the Toprol XL. I explained to her the main side effect can be fatigue, or low BP or low HR. Advised if any of these side effects are happening to please call the office, so that we can either decrease the dose or change to another medication. Pt is agreeable to plan of care. Fasting lipid and liver 05/04/18. Confirmed her appt with Dr. Acie Fredrickson 02/20/18.

## 2018-02-02 NOTE — Telephone Encounter (Signed)
Left message to go over lab results and recommendations. Results have been sent to [facility].

## 2018-02-05 ENCOUNTER — Encounter: Payer: Self-pay | Admitting: Cardiovascular Disease

## 2018-02-20 ENCOUNTER — Encounter: Payer: Self-pay | Admitting: Cardiovascular Disease

## 2018-02-20 ENCOUNTER — Ambulatory Visit (INDEPENDENT_AMBULATORY_CARE_PROVIDER_SITE_OTHER): Payer: BLUE CROSS/BLUE SHIELD | Admitting: Cardiovascular Disease

## 2018-02-20 VITALS — BP 122/66 | HR 76 | Ht 60.0 in | Wt 141.0 lb

## 2018-02-20 DIAGNOSIS — I779 Disorder of arteries and arterioles, unspecified: Secondary | ICD-10-CM

## 2018-02-20 DIAGNOSIS — I739 Peripheral vascular disease, unspecified: Secondary | ICD-10-CM

## 2018-02-20 DIAGNOSIS — I2510 Atherosclerotic heart disease of native coronary artery without angina pectoris: Secondary | ICD-10-CM

## 2018-02-20 MED ORDER — EZETIMIBE 10 MG PO TABS: 10.0000 mg | ORAL_TABLET | Freq: Every day | ORAL | 3 refills | Status: DC

## 2018-02-20 MED ORDER — METOPROLOL SUCCINATE ER 25 MG PO TB24: 25.0000 mg | ORAL_TABLET | Freq: Every day | ORAL | 3 refills | Status: DC
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
## 2018-02-20 NOTE — Patient Instructions (Addendum)
Medication Instructions:  Your physician recommends that you continue on your current medications as directed. Please refer to the Current Medication list given to you today.   Labwork: None  Testing/Procedures: None  Follow-Up: Your physician recommends that you schedule a follow-up appointment in: 6 months with Richardson Dopp, PA-C.     Any Other Special Instructions Will Be Listed Below (If Applicable).     If you need a refill on your cardiac medications before your next appointment, please call your pharmacy.

## 2018-02-27 ENCOUNTER — Other Ambulatory Visit: Payer: Self-pay | Admitting: Medical Oncology

## 2018-02-27 ENCOUNTER — Other Ambulatory Visit: Payer: Self-pay | Admitting: Internal Medicine

## 2018-02-27 DIAGNOSIS — C3490 Malignant neoplasm of unspecified part of unspecified bronchus or lung: Secondary | ICD-10-CM

## 2018-03-01 ENCOUNTER — Ambulatory Visit (HOSPITAL_COMMUNITY)
Admission: RE | Admit: 2018-03-01 | Discharge: 2018-03-01 | Disposition: A | Discharge status: Home / Self Care | Payer: BLUE CROSS/BLUE SHIELD | Source: Ambulatory Visit | Attending: Internal Medicine | Admitting: Internal Medicine

## 2018-03-01 ENCOUNTER — Inpatient Hospital Stay: Payer: BLUE CROSS/BLUE SHIELD | Attending: Internal Medicine

## 2018-03-01 DIAGNOSIS — J439 Emphysema, unspecified: Secondary | ICD-10-CM | POA: Insufficient documentation

## 2018-03-01 DIAGNOSIS — C3490 Malignant neoplasm of unspecified part of unspecified bronchus or lung: Secondary | ICD-10-CM

## 2018-03-01 DIAGNOSIS — C3432 Malignant neoplasm of lower lobe, left bronchus or lung: Secondary | ICD-10-CM | POA: Diagnosis not present

## 2018-03-01 DIAGNOSIS — Z79899 Other long term (current) drug therapy: Secondary | ICD-10-CM | POA: Insufficient documentation

## 2018-03-01 DIAGNOSIS — R05 Cough: Secondary | ICD-10-CM | POA: Insufficient documentation

## 2018-03-01 DIAGNOSIS — I700 Atherosclerosis of aorta: Secondary | ICD-10-CM | POA: Diagnosis not present

## 2018-03-01 LAB — CMP (CANCER CENTER ONLY)
ALT: 39 U/L (ref 0–44)
AST: 41 U/L (ref 15–41)
Albumin: 3.7 g/dL (ref 3.5–5.0)
Alkaline Phosphatase: 93 U/L (ref 38–126)
Anion gap: 9 (ref 5–15)
BILIRUBIN TOTAL: 0.4 mg/dL (ref 0.3–1.2)
BUN: 9 mg/dL (ref 6–20)
CHLORIDE: 105 mmol/L (ref 98–111)
CO2: 27 mmol/L (ref 22–32)
CREATININE: 0.89 mg/dL (ref 0.44–1.00)
Calcium: 10 mg/dL (ref 8.9–10.3)
Glucose, Bld: 91 mg/dL (ref 70–99)
POTASSIUM: 4.1 mmol/L (ref 3.5–5.1)
Sodium: 141 mmol/L (ref 135–145)
Total Protein: 7.5 g/dL (ref 6.5–8.1)

## 2018-03-01 LAB — CBC WITH DIFFERENTIAL (CANCER CENTER ONLY)
BASOS ABS: 0.1 10*3/uL (ref 0.0–0.1)
Basophils Relative: 1 %
EOS ABS: 0.1 10*3/uL (ref 0.0–0.5)
EOS PCT: 2 %
HCT: 43.2 % (ref 34.8–46.6)
Hemoglobin: 14.8 g/dL (ref 11.6–15.9)
Lymphocytes Relative: 33 %
Lymphs Abs: 2 10*3/uL (ref 0.9–3.3)
MCH: 34.2 pg — ABNORMAL HIGH (ref 25.1–34.0)
MCHC: 34.4 g/dL (ref 31.5–36.0)
MCV: 99.4 fL (ref 79.5–101.0)
Monocytes Absolute: 0.4 10*3/uL (ref 0.1–0.9)
Monocytes Relative: 6 %
NEUTROS PCT: 58 %
Neutro Abs: 3.6 10*3/uL (ref 1.5–6.5)
PLATELETS: 162 10*3/uL (ref 145–400)
RBC: 4.34 MIL/uL (ref 3.70–5.45)
RDW: 15 % — ABNORMAL HIGH (ref 11.2–14.5)
WBC: 6.2 10*3/uL (ref 3.9–10.3)

## 2018-03-01 MED ORDER — IOHEXOL 300 MG/ML  SOLN
100.0000 mL | Freq: Once | INTRAMUSCULAR | Status: AC | PRN
  Administered 2018-03-01: 100 mL via INTRAVENOUS

## 2018-03-01 MED ORDER — IOPAMIDOL (ISOVUE-300) INJECTION 61%
30.0000 mL | Freq: Once | INTRAVENOUS | Status: AC | PRN
  Administered 2018-03-01: 30 mL via ORAL

## 2018-03-01 MED ORDER — IOPAMIDOL (ISOVUE-300) INJECTION 61%
INTRAVENOUS | Status: AC
  Administered 2018-03-01: 30 mL via ORAL
  Filled 2018-03-01: qty 30

## 2018-03-02 ENCOUNTER — Other Ambulatory Visit: Payer: BLUE CROSS/BLUE SHIELD

## 2018-03-06 ENCOUNTER — Inpatient Hospital Stay (HOSPITAL_BASED_OUTPATIENT_CLINIC_OR_DEPARTMENT_OTHER): Payer: BLUE CROSS/BLUE SHIELD | Admitting: Internal Medicine

## 2018-03-06 ENCOUNTER — Telehealth: Payer: Self-pay

## 2018-03-06 ENCOUNTER — Encounter: Payer: Self-pay | Admitting: Internal Medicine

## 2018-03-06 VITALS — BP 120/61 | HR 60 | Temp 98.9°F | Resp 18 | Ht 60.0 in | Wt 144.8 lb

## 2018-03-06 DIAGNOSIS — C3432 Malignant neoplasm of lower lobe, left bronchus or lung: Secondary | ICD-10-CM | POA: Diagnosis not present

## 2018-03-06 DIAGNOSIS — F17200 Nicotine dependence, unspecified, uncomplicated: Secondary | ICD-10-CM

## 2018-03-06 DIAGNOSIS — E039 Hypothyroidism, unspecified: Secondary | ICD-10-CM

## 2018-03-06 DIAGNOSIS — R05 Cough: Secondary | ICD-10-CM | POA: Diagnosis not present

## 2018-03-06 DIAGNOSIS — C3490 Malignant neoplasm of unspecified part of unspecified bronchus or lung: Secondary | ICD-10-CM

## 2018-03-06 NOTE — Progress Notes (Signed)
[facility] Telephone:(336) [phone]   Fax:(336) [place], NP [place] [address]  DIAGNOSIS: Recurrent non-small cell lung cancer, adenocarcinoma with PDL 1 expression of 60% diagnosed in November 2016. The patient was initially diagnosed as stage IB (T2a, N0, M0) adenocarcinoma in March 2016.  PRIOR THERAPY:   1) Status post left video-assisted thoracoscopy, wedge resection left lower lobe, thoracoscopic left lower lobectomy and mediastinal lymph node dissection on 08/14/2014.  2) Immunotherapy with Ketruda (pembrolizumab) 200 mg IV every 3 weeks status post 35 cycles. First dose was given 05/12/2015.  This was discontinued after the patient completed 2 years of treatment.  CURRENT THERAPY: Observation.   INTERVAL HISTORY: Sharon [name] 57 y.o. female returns to the clinic today for follow-up visit.  The patient is feeling fine today with no concerning complaints except for an episode of bronchitis started few weeks ago.  She was treated with azithromycin with no improvement.  She continues to have mild cough.  She denied having any chest pain, shortness of breath or hemoptysis.  She denied having any fever or chills.  She has no nausea, vomiting, diarrhea or constipation.  She has no concerning weight loss or night sweats.  The patient is here today for evaluation with repeat CT scan of the chest and abdomen.   MEDICAL HISTORY: Past Medical History:  Diagnosis Date  . Aortic insufficiency    Echo 8/09:  EF 55-60, impaired relaxation, mild aortic stenosis (mean 8.1), moderate aortic insufficiency, trace MR, trace TR // Echo 4/18: mild LVH, EF 55-60, no RWMA, Gr 1 DD, mod AI // Echo 5/19: EF 38-93, normal diastolic function, mild aortic stenosis (mean 16, peak 30), moderate aortic insufficiency   . Arthritis   . Carotid artery disease ([place]) 03/09/2011   Carotid US 8/15 - R 40-59; L1-39 >> follow-up 1 year //  Carotid US 4/18: ICA 1-39 bilaterally - repeat 1 yr// Carotid US 5/19: Bilateral ICA 1-39  . COPD (chronic obstructive pulmonary disease) ([place])   . Coronary artery disease involving native coronary artery of native heart without angina pectoris 11/24/2009   s/p ant STEMI 5/09 >> LHC 5/09 - LM normal; LAD proximal 99; branching DX with one branch occluded and the other severely diseased; LCx okay with irregularities in the OM1; RCA proximal occluded-CTO; EF 30-40, anterior wall HK  >>  PCI: 3 x 18 mm Promus DES, 2.5 x 15 mm Promus DES to the LAD  . Emphysema of lung ([place])   . Headache 09/06/2016  . History of acute anterior wall MI 12/2007   s/p DES x 2 to LAD  . Hyperlipidemia   . Ischemic cardiomyopathy   . Non-small cell lung cancer ([place])    per CT CHEST/PET 2/4 and 07/18/14 @ [facility]  . Pneumonia    2015   HX BRONCHITIS    . Tobacco user     ALLERGIES:  is allergic to codeine; lipitor [atorvastatin calcium]; morphine and related; and oxycodone-acetaminophen.  MEDICATIONS:  Current Outpatient Medications  Medication Sig Dispense Refill  . aspirin (ASPIR-LOW) 81 MG EC tablet Take 81 mg by mouth daily. On hold    . azithromycin (ZITHROMAX) 250 MG tablet Take 250 mg by mouth 2 (two) times daily.    . benzonatate (TESSALON) 200 MG capsule Take 200 mg by mouth 3 (three) times daily as needed for cough.    Marland Kitchen buPROPion (WELLBUTRIN SR) 150 MG  12 hr tablet Take 180 mg by mouth 2 (two) times daily.   0  . Cholecalciferol (VITAMIN D3) 2000 units TABS Take 1 tablet by mouth daily.    . clopidogrel (PLAVIX) 75 MG tablet TAKE 1 TABLET BY MOUTH EVERY DAY 90 tablet 2  . ezetimibe (ZETIA) 10 MG tablet Take 1 tablet (10 mg total) by mouth daily. 90 tablet 3  . levothyroxine (SYNTHROID, LEVOTHROID) 100 MCG tablet 50 mcg.   0  . loratadine (CLARITIN) 10 MG tablet Take 10 mg by mouth daily.    . metoprolol succinate (TOPROL XL) 25 MG 24 hr tablet Take 1 tablet (25 mg total) by mouth daily. 90 tablet 3  .  ranitidine (ZANTAC) 150 MG tablet Take 150 mg by mouth 2 (two) times daily.    . rosuvastatin (CRESTOR) 40 MG tablet TAKE 1 TABLET BY MOUTH EVERY DAY 90 tablet 3  . traMADol (ULTRAM) 50 MG tablet Take 50 mg by mouth every 6 (six) hours as needed for moderate pain or severe pain.     No current facility-administered medications for this visit.     SURGICAL HISTORY:  Past Surgical History:  Procedure Laterality Date  . CARDIAC CATHETERIZATION  10/15/2007   EF 30-40%  . CESAREAN SECTION    . CHOLECYSTECTOMY    . CORONARY ANGIOPLASTY WITH STENT PLACEMENT     LAD  . ectopic pregnancies requiring laparotomies    . LEAD REMOVAL Left 08/14/2014   Procedure: CRYO INTERCOSTAL NERVE BLOCK;  Surgeon: Melrose Nakayama, MD;  Location: [facility];  Service: Thoracic;  Laterality: Left;  . LOBECTOMY Left 08/14/2014   Procedure: LEFT LOWER LOBECTOMY;  Surgeon: Melrose Nakayama, MD;  Location: [facility];  Service: Thoracic;  Laterality: Left;  . NODE DISSECTION Left 08/14/2014   Procedure: NODE DISSECTION, LEFT LOWER LOBE LUNG;  Surgeon: Melrose Nakayama, MD;  Location: [facility];  Service: Thoracic;  Laterality: Left;  . SHOULDER SURGERY     LEFT X 2   (REMOVED SOME COLLAR BONE )  . TRANSTHORACIC ECHOCARDIOGRAM  10/17/2007  . TUBAL LIGATION    . US ECHOCARDIOGRAPHY  01/07/2008   EF 55-60%  . VIDEO ASSISTED THORACOSCOPY (VATS)/WEDGE RESECTION Left 08/14/2014   Procedure: VIDEO ASSISTED THORACOSCOPY (VATS)/WEDGE RESECTION;  Surgeon: Melrose Nakayama, MD;  Location: [facility];  Service: Thoracic;  Laterality: Left;    REVIEW OF SYSTEMS:  A comprehensive review of systems was negative except for: Respiratory: positive for cough   PHYSICAL EXAMINATION: General appearance: alert, cooperative and no distress Head: Normocephalic, without obvious abnormality, atraumatic Neck: no adenopathy, no JVD, supple, symmetrical, trachea midline and thyroid not enlarged, symmetric, no tenderness/mass/nodules Lymph nodes:  Cervical, supraclavicular, and axillary nodes normal. Resp: clear to auscultation bilaterally Back: negative Cardio: regular rate and rhythm, S1, S2 normal, no murmur, click, rub or gallop GI: soft, non-tender; bowel sounds normal; no masses,  no organomegaly Extremities: extremities normal, atraumatic, no cyanosis or edema  ECOG PERFORMANCE STATUS: 1 - Symptomatic but completely ambulatory  Blood pressure 120/61, pulse 60, temperature 98.9 F (37.2 C), temperature source Oral, resp. rate 18, height 5' (1.524 m), weight 144 lb 12.8 oz (65.7 kg), SpO2 98 %.  LABORATORY DATA: Lab Results  Component Value Date   WBC 6.2 03/01/2018   HGB 14.8 03/01/2018   HCT 43.2 03/01/2018   MCV 99.4 03/01/2018   PLT 162 03/01/2018      Chemistry      Component Value Date/Time   NA 141 03/01/2018  0829   NA 139 05/25/2017 1344   K 4.1 03/01/2018 0829   K 3.5 05/25/2017 1344   CL 105 03/01/2018 0829   CO2 27 03/01/2018 0829   CO2 23 05/25/2017 1344   BUN 9 03/01/2018 0829   BUN 10.0 05/25/2017 1344   CREATININE 0.89 03/01/2018 0829   CREATININE 0.7 05/25/2017 1344      Component Value Date/Time   CALCIUM 10.0 03/01/2018 0829   CALCIUM 9.4 05/25/2017 1344   ALKPHOS 93 03/01/2018 0829   ALKPHOS 94 05/25/2017 1344   AST 41 03/01/2018 0829   AST 23 05/25/2017 1344   ALT 39 03/01/2018 0829   ALT 20 05/25/2017 1344   BILITOT 0.4 03/01/2018 0829   BILITOT 0.38 05/25/2017 1344       RADIOGRAPHIC STUDIES: Ct Chest W Contrast  Result Date: 03/01/2018 CLINICAL DATA:  Lung cancer.  Status post left lower lobectomy. EXAM: CT CHEST, ABDOMEN WITH CONTRAST TECHNIQUE: Multidetector CT imaging of the chest, abdomen and pelvis was performed following the standard protocol during bolus administration of intravenous contrast. CONTRAST:  168mL OMNIPAQUE IOHEXOL 300 MG/ML  SOLN COMPARISON:  11/27/2017 FINDINGS: CT CHEST FINDINGS Cardiovascular: The heart size is normal. No substantial pericardial  effusion. Coronary artery calcification is evident. Atherosclerotic calcification is noted in the wall of the thoracic aorta. Ascending thoracic aorta measures 4.1 cm diameter. Mediastinum/Nodes: 9 mm short axis prevascular lymph node is stable. 6 mm short axis subcarinal lymph node measures minimally smaller today. There is no hilar lymphadenopathy. The esophagus has normal imaging features. There is no axillary lymphadenopathy. Lungs/Pleura: The central tracheobronchial airways are patent. Centrilobular and paraseptal emphysema again noted. Volume loss left hemithorax compatible with prior left lower lobectomy. Areas of architectural distortion/scarring are noted bilaterally. Linear band of scarring in the posterior left costophrenic sulcus (112/7) is unchanged. No new or suspicious pulmonary nodule or mass. No pleural effusion. Musculoskeletal: No worrisome lytic or sclerotic osseous abnormality. CT ABDOMEN FINDINGS Hepatobiliary: No focal abnormality within the liver parenchyma. Gallbladder surgically absent. No intrahepatic or extrahepatic biliary dilation. Pancreas: No focal mass lesion. No dilatation of the main duct. No intraparenchymal cyst. No peripancreatic edema. Spleen: No splenomegaly. No focal mass lesion. Adrenals/Urinary Tract: No adrenal nodule or mass. Kidneys unremarkable. Stomach/Bowel: Stomach is nondistended. No gastric wall thickening. No evidence of outlet obstruction. Duodenum is normally positioned as is the ligament of Treitz. Small bowel loops and colonic segments of the abdomen are nondilated. Vascular/Lymphatic: There is abdominal aortic atherosclerosis without aneurysm. There is no gastrohepatic or hepatoduodenal ligament lymphadenopathy. No intraperitoneal or retroperitoneal lymphadenopathy. Other: No intraperitoneal free fluid. Musculoskeletal: No worrisome lytic or sclerotic osseous abnormality. IMPRESSION: 1. Stable exam. No new or progressive interval findings. No features to  suggest recurrent or metastatic disease. 2.  Emphysema. (ICD10-J43.9) 3.  Aortic Atherosclerois (ICD10-170.0) 4. Ascending thoracic aorta measures 4.1 cm diameter. Recommend annual imaging followup by CTA or MRA. This recommendation follows 2010 ACCF/AHA/AATS/ACR/ASA/SCA/SCAI/SIR/STS/SVM Guidelines for the Diagnosis and Management of Patients with Thoracic Aortic Disease. Circulation. 2010; 121: G626-R485 Electronically Signed   By: Misty Stanley M.D.   On: 03/01/2018 11:02   Ct Abdomen W Contrast  Result Date: 03/01/2018 CLINICAL DATA:  Lung cancer.  Status post left lower lobectomy. EXAM: CT CHEST, ABDOMEN WITH CONTRAST TECHNIQUE: Multidetector CT imaging of the chest, abdomen and pelvis was performed following the standard protocol during bolus administration of intravenous contrast. CONTRAST:  148mL OMNIPAQUE IOHEXOL 300 MG/ML  SOLN COMPARISON:  11/27/2017 FINDINGS: CT CHEST FINDINGS Cardiovascular: The  heart size is normal. No substantial pericardial effusion. Coronary artery calcification is evident. Atherosclerotic calcification is noted in the wall of the thoracic aorta. Ascending thoracic aorta measures 4.1 cm diameter. Mediastinum/Nodes: 9 mm short axis prevascular lymph node is stable. 6 mm short axis subcarinal lymph node measures minimally smaller today. There is no hilar lymphadenopathy. The esophagus has normal imaging features. There is no axillary lymphadenopathy. Lungs/Pleura: The central tracheobronchial airways are patent. Centrilobular and paraseptal emphysema again noted. Volume loss left hemithorax compatible with prior left lower lobectomy. Areas of architectural distortion/scarring are noted bilaterally. Linear band of scarring in the posterior left costophrenic sulcus (112/7) is unchanged. No new or suspicious pulmonary nodule or mass. No pleural effusion. Musculoskeletal: No worrisome lytic or sclerotic osseous abnormality. CT ABDOMEN FINDINGS Hepatobiliary: No focal abnormality within  the liver parenchyma. Gallbladder surgically absent. No intrahepatic or extrahepatic biliary dilation. Pancreas: No focal mass lesion. No dilatation of the main duct. No intraparenchymal cyst. No peripancreatic edema. Spleen: No splenomegaly. No focal mass lesion. Adrenals/Urinary Tract: No adrenal nodule or mass. Kidneys unremarkable. Stomach/Bowel: Stomach is nondistended. No gastric wall thickening. No evidence of outlet obstruction. Duodenum is normally positioned as is the ligament of Treitz. Small bowel loops and colonic segments of the abdomen are nondilated. Vascular/Lymphatic: There is abdominal aortic atherosclerosis without aneurysm. There is no gastrohepatic or hepatoduodenal ligament lymphadenopathy. No intraperitoneal or retroperitoneal lymphadenopathy. Other: No intraperitoneal free fluid. Musculoskeletal: No worrisome lytic or sclerotic osseous abnormality. IMPRESSION: 1. Stable exam. No new or progressive interval findings. No features to suggest recurrent or metastatic disease. 2.  Emphysema. (ICD10-J43.9) 3.  Aortic Atherosclerois (ICD10-170.0) 4. Ascending thoracic aorta measures 4.1 cm diameter. Recommend annual imaging followup by CTA or MRA. This recommendation follows 2010 ACCF/AHA/AATS/ACR/ASA/SCA/SCAI/SIR/STS/SVM Guidelines for the Diagnosis and Management of Patients with Thoracic Aortic Disease. Circulation. 2010; 121: D532-D924 Electronically Signed   By: Misty Stanley M.D.   On: 03/01/2018 11:02    ASSESSMENT AND PLAN:  This is a very pleasant 57 years old white female with recurrent non-small cell lung cancer, adenocarcinoma with positive PDL 1 expression of 60%. The patient completed treatment with immunotherapy with Ketruda 200 mg IV every 3 weeks status post 35 cycles. The patient tolerated the previous course of her treatment fairly well. She is currently on observation and she is feeling fine. She had repeat CT scan of the chest and abdomen performed recently.  I  personally and independently reviewed the scans and discussed the results with the patient today. Her scan showed no concerning findings for disease progression. I recommended for the patient to continue on observation with repeat CT scan of the chest and abdomen and 4 months. I also strongly encouraged the patient to quit smoking. She was advised to call immediately if she has any concerning symptoms in the interval. The patient voices understanding of current disease status and treatment options and is in agreement with the current care plan. All questions were answered. The patient knows to call the clinic with any problems, questions or concerns. We can certainly see the patient much sooner if necessary.  Disclaimer: This note was dictated with voice recognition software. Similar sounding words can inadvertently be transcribed and may not be corrected upon review.

## 2018-03-06 NOTE — Telephone Encounter (Signed)
Printed avs and calender of upcoming appointment.per 10/8 los

## 2018-05-04 ENCOUNTER — Other Ambulatory Visit: Payer: BLUE CROSS/BLUE SHIELD

## 2018-05-07 ENCOUNTER — Telehealth: Payer: Self-pay | Admitting: Physician Assistant

## 2018-05-07 ENCOUNTER — Telehealth: Payer: Self-pay

## 2018-05-07 NOTE — Telephone Encounter (Signed)
Reviewed results with patient who verbalized understanding.Pt states that her daughter who is a nurse want [place] to know she feels that the Metoprolol is making her HR too low. Pt states that her HR was as low as 60. Pt says she is take Metoprolol 25 mg qd. I let pt know that I will inform Nicki Reaper and will call her back if he has any recommendations.

## 2018-05-07 NOTE — Telephone Encounter (Signed)
A HR of 60 is good.  If she is feeling fatigued, lightheaded or dizzy with this, she can decrease it to 1/2 tablet daily.  Otherwise, I recommend she stay on Toprol 25 mg QD. Richardson Dopp, PA-C    05/07/2018 5:15 PM

## 2018-05-07 NOTE — Telephone Encounter (Signed)
Follow Up:    Returning your call, concerning her lab results.

## 2018-05-08 NOTE — Telephone Encounter (Signed)
Returned pt call and reviewed recommendation per Richardson Dopp, PA-C who verbalized understanding. Pt states that she does get a little lightheaded and dizzy but not all the time. Pt just wanted to inform Scott of HR.  Pt states that she will try to continue to take Metoprolol 25 mg qd and didn't know that she could cut the pill in half. Pt thanked me for the call.

## 2018-05-08 NOTE — Telephone Encounter (Signed)
I left a message for the patient to return my call about recommendations.

## 2018-05-08 NOTE — Telephone Encounter (Signed)
Follow up ° °Pt returning call for nurse °

## 2018-07-03 ENCOUNTER — Other Ambulatory Visit: Payer: BLUE CROSS/BLUE SHIELD

## 2018-07-05 ENCOUNTER — Ambulatory Visit: Payer: BLUE CROSS/BLUE SHIELD | Admitting: Internal Medicine

## 2018-07-09 ENCOUNTER — Ambulatory Visit (HOSPITAL_COMMUNITY)
Admission: RE | Admit: 2018-07-09 | Discharge: 2018-07-09 | Disposition: A | Discharge status: Home / Self Care | Payer: Medicaid Other | Source: Ambulatory Visit | Attending: Internal Medicine | Admitting: Internal Medicine

## 2018-07-09 ENCOUNTER — Encounter (HOSPITAL_COMMUNITY): Payer: Self-pay

## 2018-07-09 ENCOUNTER — Inpatient Hospital Stay: Payer: Medicaid Other | Attending: Internal Medicine

## 2018-07-09 DIAGNOSIS — C3490 Malignant neoplasm of unspecified part of unspecified bronchus or lung: Secondary | ICD-10-CM

## 2018-07-09 DIAGNOSIS — C3432 Malignant neoplasm of lower lobe, left bronchus or lung: Secondary | ICD-10-CM | POA: Diagnosis present

## 2018-07-09 DIAGNOSIS — Z79899 Other long term (current) drug therapy: Secondary | ICD-10-CM | POA: Diagnosis not present

## 2018-07-09 LAB — CBC WITH DIFFERENTIAL (CANCER CENTER ONLY)
ABS IMMATURE GRANULOCYTES: 0.01 10*3/uL (ref 0.00–0.07)
BASOS ABS: 0 10*3/uL (ref 0.0–0.1)
Basophils Relative: 1 %
EOS PCT: 1 %
Eosinophils Absolute: 0.1 10*3/uL (ref 0.0–0.5)
HEMATOCRIT: 45.1 % (ref 36.0–46.0)
HEMOGLOBIN: 14.9 g/dL (ref 12.0–15.0)
Immature Granulocytes: 0 %
LYMPHS ABS: 2.3 10*3/uL (ref 0.7–4.0)
LYMPHS PCT: 35 %
MCH: 33.3 pg (ref 26.0–34.0)
MCHC: 33 g/dL (ref 30.0–36.0)
MCV: 100.9 fL — ABNORMAL HIGH (ref 80.0–100.0)
Monocytes Absolute: 0.6 10*3/uL (ref 0.1–1.0)
Monocytes Relative: 8 %
NEUTROS ABS: 3.7 10*3/uL (ref 1.7–7.7)
NRBC: 0 % (ref 0.0–0.2)
Neutrophils Relative %: 55 %
Platelet Count: 183 10*3/uL (ref 150–400)
RBC: 4.47 MIL/uL (ref 3.87–5.11)
RDW: 13.4 % (ref 11.5–15.5)
WBC Count: 6.6 10*3/uL (ref 4.0–10.5)

## 2018-07-09 LAB — CMP (CANCER CENTER ONLY)
ALT: 18 U/L (ref 0–44)
AST: 26 U/L (ref 15–41)
Albumin: 3.8 g/dL (ref 3.5–5.0)
Alkaline Phosphatase: 89 U/L (ref 38–126)
Anion gap: 10 (ref 5–15)
BUN: 7 mg/dL (ref 6–20)
CO2: 26 mmol/L (ref 22–32)
CREATININE: 0.8 mg/dL (ref 0.44–1.00)
Calcium: 9.7 mg/dL (ref 8.9–10.3)
Chloride: 102 mmol/L (ref 98–111)
GFR, Estimated: >60 mL/min (ref >60)
Glucose, Bld: 96 mg/dL (ref 70–99)
Potassium: 4.1 mmol/L (ref 3.5–5.1)
SODIUM: 138 mmol/L (ref 135–145)
Total Bilirubin: 0.7 mg/dL (ref 0.3–1.2)
Total Protein: 7.3 g/dL (ref 6.5–8.1)

## 2018-07-09 MED ORDER — SODIUM CHLORIDE (PF) 0.9 % IJ SOLN
INTRAMUSCULAR | Status: AC
  Filled 2018-07-09: qty 50

## 2018-07-09 MED ORDER — IOHEXOL 300 MG/ML  SOLN
100.0000 mL | Freq: Once | INTRAMUSCULAR | Status: AC | PRN
  Administered 2018-07-09: 100 mL via INTRAVENOUS

## 2018-07-09 MED ORDER — IOHEXOL 300 MG/ML  SOLN
30.0000 mL | Freq: Once | INTRAMUSCULAR | Status: AC | PRN
  Administered 2018-07-09: 30 mL via ORAL

## 2018-07-10 ENCOUNTER — Other Ambulatory Visit: Payer: BLUE CROSS/BLUE SHIELD

## 2018-07-12 ENCOUNTER — Encounter: Payer: Self-pay | Admitting: Internal Medicine

## 2018-07-12 ENCOUNTER — Telehealth: Payer: Self-pay

## 2018-07-12 ENCOUNTER — Inpatient Hospital Stay (HOSPITAL_BASED_OUTPATIENT_CLINIC_OR_DEPARTMENT_OTHER): Payer: Medicaid Other | Admitting: Internal Medicine

## 2018-07-12 VITALS — BP 136/76 | HR 74 | Temp 98.7°F | Resp 17 | Ht 60.0 in | Wt 147.8 lb

## 2018-07-12 DIAGNOSIS — C3490 Malignant neoplasm of unspecified part of unspecified bronchus or lung: Secondary | ICD-10-CM

## 2018-07-12 DIAGNOSIS — C3432 Malignant neoplasm of lower lobe, left bronchus or lung: Secondary | ICD-10-CM

## 2018-07-12 DIAGNOSIS — R5382 Chronic fatigue, unspecified: Secondary | ICD-10-CM

## 2018-07-12 DIAGNOSIS — C3412 Malignant neoplasm of upper lobe, left bronchus or lung: Secondary | ICD-10-CM

## 2018-07-12 NOTE — Telephone Encounter (Signed)
Printed avs and calender of upcoming appointment. Per 2/13 los 

## 2018-07-12 NOTE — Progress Notes (Signed)
[facility] [phone]   Fax:(336) [place], NP [place] [address]  DIAGNOSIS: Recurrent non-small cell lung cancer, adenocarcinoma with PDL 1 expression of 60% diagnosed in November 2016. The patient was initially diagnosed as stage IB (T2a, N0, M0) adenocarcinoma in March 2016.  PRIOR THERAPY:   1) Status post left video-assisted thoracoscopy, wedge resection left lower lobe, thoracoscopic left lower lobectomy and mediastinal lymph node dissection on 08/14/2014.  2) Immunotherapy with Ketruda (pembrolizumab) 200 mg IV every 3 weeks status post 35 cycles. First dose was given 05/12/2015.  This was discontinued after the patient completed 2 years of treatment.  CURRENT THERAPY: Observation.   INTERVAL HISTORY: Sharon [name] 58 y.o. female returns to the clinic today for follow-up visit.  The patient is feeling fine today with no concerning complaints.  She denied having any chest pain, shortness of breath, cough or hemoptysis.  She denied having any weight loss or night sweats.  She has no nausea, vomiting, diarrhea or constipation.  She denied having any headache or visual changes.  She had repeat CT scan of the chest and abdomen performed recently and she is here for evaluation and discussion of her scan results.   MEDICAL HISTORY: Past Medical History:  Diagnosis Date  . Aortic insufficiency    Echo 8/09:  EF 55-60, impaired relaxation, mild aortic stenosis (mean 8.1), moderate aortic insufficiency, trace MR, trace TR // Echo 4/18: mild LVH, EF 55-60, no RWMA, Gr 1 DD, mod AI // Echo 5/19: EF 42-59, normal diastolic function, mild aortic stenosis (mean 16, peak 30), moderate aortic insufficiency   . Arthritis   . Carotid artery disease ([facility]) 03/09/2011   Carotid US 8/15 - R 40-59; L1-39 >> follow-up 1 year // Carotid US 4/18: ICA 1-39 bilaterally - repeat 1 yr// Carotid US 5/19: Bilateral ICA 1-39    . COPD (chronic obstructive pulmonary disease) ([facility])   . Coronary artery disease involving native coronary artery of native heart without angina pectoris 11/24/2009   s/p ant STEMI 5/09 >> LHC 5/09 - LM normal; LAD proximal 99; branching DX with one branch occluded and the other severely diseased; LCx okay with irregularities in the OM1; RCA proximal occluded-CTO; EF 30-40, anterior wall HK  >>  PCI: 3 x 18 mm Promus DES, 2.5 x 15 mm Promus DES to the LAD  . Emphysema of lung ([facility])   . Headache 09/06/2016  . History of acute anterior wall MI 12/2007   s/p DES x 2 to LAD  . Hyperlipidemia   . Ischemic cardiomyopathy   . Non-small cell lung cancer ([facility])    per CT CHEST/PET 2/4 and 07/18/14 @ [facility]  . Pneumonia    2015   HX BRONCHITIS    . Tobacco user     ALLERGIES:  is allergic to codeine; lipitor [atorvastatin calcium]; morphine and related; and oxycodone-acetaminophen.  MEDICATIONS:  Current Outpatient Medications  Medication Sig Dispense Refill  . aspirin (ASPIR-LOW) 81 MG EC tablet Take 81 mg by mouth daily. On hold    . azithromycin (ZITHROMAX) 250 MG tablet Take 250 mg by mouth 2 (two) times daily.    . benzonatate (TESSALON) 200 MG capsule Take 200 mg by mouth 3 (three) times daily as needed for cough.    Marland Kitchen buPROPion (WELLBUTRIN SR) 150 MG 12 hr tablet Take 180 mg by mouth 2 (two) times daily.   0  .  Cholecalciferol (VITAMIN D3) 2000 units TABS Take 1 tablet by mouth daily.    . clopidogrel (PLAVIX) 75 MG tablet TAKE 1 TABLET BY MOUTH EVERY DAY 90 tablet 2  . ezetimibe (ZETIA) 10 MG tablet Take 1 tablet (10 mg total) by mouth daily. 90 tablet 3  . levothyroxine (SYNTHROID, LEVOTHROID) 100 MCG tablet 50 mcg.   0  . loratadine (CLARITIN) 10 MG tablet Take 10 mg by mouth daily.    . metoprolol succinate (TOPROL XL) 25 MG 24 hr tablet Take 1 tablet (25 mg total) by mouth daily. 90 tablet 3  . ranitidine (ZANTAC) 150 MG tablet Take 150 mg by mouth 2 (two) times daily.    .  rosuvastatin (CRESTOR) 40 MG tablet TAKE 1 TABLET BY MOUTH EVERY DAY 90 tablet 3  . traMADol (ULTRAM) 50 MG tablet Take 50 mg by mouth every 6 (six) hours as needed for moderate pain or severe pain.     No current facility-administered medications for this visit.     SURGICAL HISTORY:  Past Surgical History:  Procedure Laterality Date  . CARDIAC CATHETERIZATION  10/15/2007   EF 30-40%  . CESAREAN SECTION    . CHOLECYSTECTOMY    . CORONARY ANGIOPLASTY WITH STENT PLACEMENT     LAD  . ectopic pregnancies requiring laparotomies    . LEAD REMOVAL Left 08/14/2014   Procedure: CRYO INTERCOSTAL NERVE BLOCK;  Surgeon: Melrose Nakayama, MD;  Location: [facility];  Service: Thoracic;  Laterality: Left;  . LOBECTOMY Left 08/14/2014   Procedure: LEFT LOWER LOBECTOMY;  Surgeon: Melrose Nakayama, MD;  Location: [facility];  Service: Thoracic;  Laterality: Left;  . NODE DISSECTION Left 08/14/2014   Procedure: NODE DISSECTION, LEFT LOWER LOBE LUNG;  Surgeon: Melrose Nakayama, MD;  Location: [facility];  Service: Thoracic;  Laterality: Left;  . SHOULDER SURGERY     LEFT X 2   (REMOVED SOME COLLAR BONE )  . TRANSTHORACIC ECHOCARDIOGRAM  10/17/2007  . TUBAL LIGATION    . US ECHOCARDIOGRAPHY  01/07/2008   EF 55-60%  . VIDEO ASSISTED THORACOSCOPY (VATS)/WEDGE RESECTION Left 08/14/2014   Procedure: VIDEO ASSISTED THORACOSCOPY (VATS)/WEDGE RESECTION;  Surgeon: Melrose Nakayama, MD;  Location: [facility];  Service: Thoracic;  Laterality: Left;    REVIEW OF SYSTEMS:  A comprehensive review of systems was negative.   PHYSICAL EXAMINATION: General appearance: alert, cooperative and no distress Head: Normocephalic, without obvious abnormality, atraumatic Neck: no adenopathy, no JVD, supple, symmetrical, trachea midline and thyroid not enlarged, symmetric, no tenderness/mass/nodules Lymph nodes: Cervical, supraclavicular, and axillary nodes normal. Resp: clear to auscultation bilaterally Back: negative Cardio:  regular rate and rhythm, S1, S2 normal, no murmur, click, rub or gallop GI: soft, non-tender; bowel sounds normal; no masses,  no organomegaly Extremities: extremities normal, atraumatic, no cyanosis or edema  ECOG PERFORMANCE STATUS: 1 - Symptomatic but completely ambulatory  Blood pressure 136/76, pulse 74, temperature 98.7 F (37.1 C), temperature source Oral, resp. rate 17, height 5' (1.524 m), weight 147 lb 12.8 oz (67 kg), SpO2 96 %.  LABORATORY DATA: Lab Results  Component Value Date   WBC 6.6 07/09/2018   HGB 14.9 07/09/2018   HCT 45.1 07/09/2018   MCV 100.9 (H) 07/09/2018   PLT 183 07/09/2018      Chemistry      Component Value Date/Time   NA 138 07/09/2018 1349   NA 139 05/25/2017 1344   K 4.1 07/09/2018 1349   K 3.5 05/25/2017 1344   CL  102 07/09/2018 1349   CO2 26 07/09/2018 1349   CO2 23 05/25/2017 1344   BUN 7 07/09/2018 1349   BUN 10.0 05/25/2017 1344   CREATININE 0.80 07/09/2018 1349   CREATININE 0.7 05/25/2017 1344      Component Value Date/Time   CALCIUM 9.7 07/09/2018 1349   CALCIUM 9.4 05/25/2017 1344   ALKPHOS 89 07/09/2018 1349   ALKPHOS 94 05/25/2017 1344   AST 26 07/09/2018 1349   AST 23 05/25/2017 1344   ALT 18 07/09/2018 1349   ALT 20 05/25/2017 1344   BILITOT 0.7 07/09/2018 1349   BILITOT 0.38 05/25/2017 1344       RADIOGRAPHIC STUDIES: Ct Chest W Contrast  Result Date: 07/09/2018 CLINICAL DATA:  Lung cancer. EXAM: CT CHEST, ABDOMEN, AND PELVIS WITH CONTRAST TECHNIQUE: Multidetector CT imaging of the chest, abdomen and pelvis was performed following the standard protocol during bolus administration of intravenous contrast. CONTRAST:  125mL OMNIPAQUE IOHEXOL 300 MG/ML  SOLN COMPARISON:  03/01/2018.  Thoracic spine radiographs 06/07/2018. FINDINGS: CT CHEST FINDINGS Cardiovascular: Atherosclerotic calcification of the aorta, aortic valve and coronary arteries. Ascending aorta measures up to 4.1 cm. Heart is at the upper limits of normal in  size. No pericardial effusion. Mediastinum/Nodes: Mediastinal lymph nodes measure up to 10 mm in the prevascular space, stable. No hilar or axillary adenopathy. Esophagus is grossly unremarkable. Lungs/Pleura: Centrilobular emphysema. Subpleural scarring in the apical left upper lobe, as before. Postoperative changes in the anterior left upper lobe. Right lower lobectomy. Lungs are otherwise clear. No pleural fluid. Airway is otherwise unremarkable. Musculoskeletal: T12 compression deformity, as on 06/07/2018. No worrisome lytic or sclerotic lesions. CT ABDOMEN PELVIS FINDINGS Hepatobiliary: Liver is unremarkable. Cholecystectomy. No biliary ductal dilatation. Pancreas: Negative. Spleen: Negative. Adrenals/Urinary Tract: Adrenal glands and kidneys are unremarkable. Ureters are decompressed. Bladder is unremarkable. Stomach/Bowel: Stomach, small bowel and appendix are unremarkable. Stool is seen throughout the colon, indicative of constipation. Vascular/Lymphatic: Atherosclerotic calcification of the aorta without aneurysm. No pathologically enlarged lymph nodes. Reproductive: Uterus is visualized.  No adnexal mass. Other: No free fluid.  Mesenteries and peritoneum are unremarkable. Musculoskeletal: No worrisome lytic or sclerotic lesions. IMPRESSION: 1. No evidence of recurrent or metastatic disease. 2. Ascending Aortic aneurysm NOS (ICD10-I71.9). Recommend annual imaging followup by CTA or MRA. This recommendation follows 2010 ACCF/AHA/AATS/ACR/ASA/SCA/SCAI/SIR/STS/SVM Guidelines for the Diagnosis and Management of Patients with Thoracic Aortic Disease. Circulation. 2010; 121: N562-Z308. Aortic aneurysm NOS (ICD10-I71.9). 3. Aortic atherosclerosis (ICD10-170.0). Coronary artery calcification. 4.  Emphysema (ICD10-J43.9). Electronically Signed   By: Lorin Picket M.D.   On: 07/09/2018 16:00   Ct Abdomen W Contrast  Result Date: 07/09/2018 CLINICAL DATA:  Lung cancer. EXAM: CT CHEST, ABDOMEN, AND PELVIS WITH  CONTRAST TECHNIQUE: Multidetector CT imaging of the chest, abdomen and pelvis was performed following the standard protocol during bolus administration of intravenous contrast. CONTRAST:  131mL OMNIPAQUE IOHEXOL 300 MG/ML  SOLN COMPARISON:  03/01/2018.  Thoracic spine radiographs 06/07/2018. FINDINGS: CT CHEST FINDINGS Cardiovascular: Atherosclerotic calcification of the aorta, aortic valve and coronary arteries. Ascending aorta measures up to 4.1 cm. Heart is at the upper limits of normal in size. No pericardial effusion. Mediastinum/Nodes: Mediastinal lymph nodes measure up to 10 mm in the prevascular space, stable. No hilar or axillary adenopathy. Esophagus is grossly unremarkable. Lungs/Pleura: Centrilobular emphysema. Subpleural scarring in the apical left upper lobe, as before. Postoperative changes in the anterior left upper lobe. Right lower lobectomy. Lungs are otherwise clear. No pleural fluid. Airway is otherwise unremarkable. Musculoskeletal: T12 compression  deformity, as on 06/07/2018. No worrisome lytic or sclerotic lesions. CT ABDOMEN PELVIS FINDINGS Hepatobiliary: Liver is unremarkable. Cholecystectomy. No biliary ductal dilatation. Pancreas: Negative. Spleen: Negative. Adrenals/Urinary Tract: Adrenal glands and kidneys are unremarkable. Ureters are decompressed. Bladder is unremarkable. Stomach/Bowel: Stomach, small bowel and appendix are unremarkable. Stool is seen throughout the colon, indicative of constipation. Vascular/Lymphatic: Atherosclerotic calcification of the aorta without aneurysm. No pathologically enlarged lymph nodes. Reproductive: Uterus is visualized.  No adnexal mass. Other: No free fluid.  Mesenteries and peritoneum are unremarkable. Musculoskeletal: No worrisome lytic or sclerotic lesions. IMPRESSION: 1. No evidence of recurrent or metastatic disease. 2. Ascending Aortic aneurysm NOS (ICD10-I71.9). Recommend annual imaging followup by CTA or MRA. This recommendation follows 2010  ACCF/AHA/AATS/ACR/ASA/SCA/SCAI/SIR/STS/SVM Guidelines for the Diagnosis and Management of Patients with Thoracic Aortic Disease. Circulation. 2010; 121: H417-E081. Aortic aneurysm NOS (ICD10-I71.9). 3. Aortic atherosclerosis (ICD10-170.0). Coronary artery calcification. 4.  Emphysema (ICD10-J43.9). Electronically Signed   By: Lorin Picket M.D.   On: 07/09/2018 16:00    ASSESSMENT AND PLAN:  This is a very pleasant 58 years old white female with recurrent non-small cell lung cancer, adenocarcinoma with positive PDL 1 expression of 60%. The patient completed treatment with immunotherapy with Ketruda 200 mg IV every 3 weeks status post 35 cycles. The patient tolerated the previous course of her treatment fairly well. She is currently on observation and feeling fine. She had repeat CT scan of the chest and abdomen performed recently. I personally and independently reviewed the scans and discussed the results with the patient today. Her scan showed no concerning findings for disease recurrence or metastasis. I recommended for the patient to continue on observation with repeat CT scan of the chest and abdomen in 3 months. She was advised to call immediately if she has any other concerning symptoms in the interval. The patient voices understanding of current disease status and treatment options and is in agreement with the current care plan. All questions were answered. The patient knows to call the clinic with any problems, questions or concerns. We can certainly see the patient much sooner if necessary.  Disclaimer: This note was dictated with voice recognition software. Similar sounding words can inadvertently be transcribed and may not be corrected upon review.

## 2018-07-17 ENCOUNTER — Other Ambulatory Visit (HOSPITAL_COMMUNITY): Payer: Self-pay | Admitting: Interventional Radiology

## 2018-07-17 ENCOUNTER — Other Ambulatory Visit (HOSPITAL_COMMUNITY): Payer: Self-pay | Admitting: Family

## 2018-07-17 DIAGNOSIS — M545 Low back pain, unspecified: Secondary | ICD-10-CM

## 2018-07-17 DIAGNOSIS — S22080A Wedge compression fracture of T11-T12 vertebra, initial encounter for closed fracture: Secondary | ICD-10-CM

## 2018-07-20 ENCOUNTER — Ambulatory Visit (HOSPITAL_COMMUNITY)
Admission: RE | Admit: 2018-07-20 | Discharge: 2018-07-20 | Disposition: A | Discharge status: Home / Self Care | Payer: Medicaid Other | Source: Ambulatory Visit | Attending: Family | Admitting: Family

## 2018-07-20 DIAGNOSIS — M545 Low back pain, unspecified: Secondary | ICD-10-CM

## 2018-07-25 ENCOUNTER — Ambulatory Visit (HOSPITAL_COMMUNITY)
Admission: RE | Admit: 2018-07-25 | Discharge: 2018-07-25 | Disposition: A | Discharge status: Home / Self Care | Payer: Medicaid Other | Source: Ambulatory Visit | Attending: Interventional Radiology | Admitting: Interventional Radiology

## 2018-07-25 DIAGNOSIS — S22080A Wedge compression fracture of T11-T12 vertebra, initial encounter for closed fracture: Secondary | ICD-10-CM

## 2018-07-25 NOTE — Consult Note (Signed)
Chief Complaint: Patient was seen in consultation today for T12 compression fracture.  Referring Physician(s): Heide Scales F  Supervising Physician: Luanne Bras  Patient Status: [facility] - Out-pt  History of Present Illness: Sharon [name] is a 58 y.o. female with a past medical history as below, with pertinent past medical history including hyperlipidemia, ischemic cardiomyopathy, CAD, MI 2009, COPD, emphysema, pneumonia, non-small cell lung cancer, arthritis, and tobacco use. On 06/01/2018, patient was lifting a case of water and heard a loud pop in her back. She immediately felt back pain, and went to her PCP for evaluation.  MR lumbar spine 07/20/2018: 1. 35% superior endplate compression fracture at T12 appears to be subacute. There is 3 mm of posterior bony retropulsion but no impingement at this level. 2. Borderline bilateral foraminal stenosis at L5-S1 due to spondylosis and degenerative disc disease. There is also mild degenerative disc disease at L3-4 and L4-5.  IR requested by Heide Scales, NP for management of T12 compression fracture. Patient awake and alert sitting in chair. Complains of constant low back pain. Rates pain as 6/10 now and 10/10 when pain is at its worst. States that she takes Hydrocodone and Tylenol with some relief of pain. Complains of numbness/tingling/pain down bilateral legs, L>R (states numbness/tingling/pain shoots down to toes of left side, and thigh of right side). Denies bladder/bowel incontinence or urinary symptoms including dysuria or urinary frequency.  Patient is currently taking Plavix 75 mg once daily and Aspirin 81 mg once daily.   Past Medical History:  Diagnosis Date  . Aortic insufficiency    Echo 8/09:  EF 55-60, impaired relaxation, mild aortic stenosis (mean 8.1), moderate aortic insufficiency, trace MR, trace TR // Echo 4/18: mild LVH, EF 55-60, no RWMA, Gr 1 DD, mod AI // Echo 5/19: EF 94-58, normal diastolic function, mild  aortic stenosis (mean 16, peak 30), moderate aortic insufficiency   . Arthritis   . Carotid artery disease ([place]) 03/09/2011   Carotid US 8/15 - R 40-59; L1-39 >> follow-up 1 year // Carotid US 4/18: ICA 1-39 bilaterally - repeat 1 yr// Carotid US 5/19: Bilateral ICA 1-39  . COPD (chronic obstructive pulmonary disease) ([place])   . Coronary artery disease involving native coronary artery of native heart without angina pectoris 11/24/2009   s/p ant STEMI 5/09 >> LHC 5/09 - LM normal; LAD proximal 99; branching DX with one branch occluded and the other severely diseased; LCx okay with irregularities in the OM1; RCA proximal occluded-CTO; EF 30-40, anterior wall HK  >>  PCI: 3 x 18 mm Promus DES, 2.5 x 15 mm Promus DES to the LAD  . Emphysema of lung ([place])   . Headache 09/06/2016  . History of acute anterior wall MI 12/2007   s/p DES x 2 to LAD  . Hyperlipidemia   . Ischemic cardiomyopathy   . Non-small cell lung cancer ([place])    per CT CHEST/PET 2/4 and 07/18/14 @ [facility]  . Pneumonia    2015   HX BRONCHITIS    . Tobacco user     Past Surgical History:  Procedure Laterality Date  . CARDIAC CATHETERIZATION  10/15/2007   EF 30-40%  . CESAREAN SECTION    . CHOLECYSTECTOMY    . CORONARY ANGIOPLASTY WITH STENT PLACEMENT     LAD  . ectopic pregnancies requiring laparotomies    . LEAD REMOVAL Left 08/14/2014   Procedure: CRYO INTERCOSTAL NERVE BLOCK;  Surgeon: Melrose Nakayama, MD;  Location: [place];  Service:  Thoracic;  Laterality: Left;  . LOBECTOMY Left 08/14/2014   Procedure: LEFT LOWER LOBECTOMY;  Surgeon: Melrose Nakayama, MD;  Location: [place];  Service: Thoracic;  Laterality: Left;  . NODE DISSECTION Left 08/14/2014   Procedure: NODE DISSECTION, LEFT LOWER LOBE LUNG;  Surgeon: Melrose Nakayama, MD;  Location: [place];  Service: Thoracic;  Laterality: Left;  . SHOULDER SURGERY     LEFT X 2   (REMOVED SOME COLLAR BONE )  . TRANSTHORACIC ECHOCARDIOGRAM  10/17/2007  . TUBAL LIGATION      . US ECHOCARDIOGRAPHY  01/07/2008   EF 55-60%  . VIDEO ASSISTED THORACOSCOPY (VATS)/WEDGE RESECTION Left 08/14/2014   Procedure: VIDEO ASSISTED THORACOSCOPY (VATS)/WEDGE RESECTION;  Surgeon: Melrose Nakayama, MD;  Location: [place];  Service: Thoracic;  Laterality: Left;    Allergies: Codeine; Lipitor [atorvastatin calcium]; Morphine and related; and Oxycodone-acetaminophen  Medications: Prior to Admission medications   Medication Sig Start Date End Date Taking? Authorizing Provider  aspirin (ASPIR-LOW) 81 MG EC tablet Take 81 mg by mouth daily. On hold 01/04/14   [provider]  azithromycin (ZITHROMAX) 250 MG tablet Take 250 mg by mouth 2 (two) times daily.    [provider]  benzonatate (TESSALON) 200 MG capsule Take 200 mg by mouth 3 (three) times daily as needed for cough.    [provider]  buPROPion (WELLBUTRIN SR) 150 MG 12 hr tablet Take 180 mg by mouth 2 (two) times daily.  08/10/17   [provider]  Cholecalciferol (VITAMIN D3) 2000 units TABS Take 1 tablet by mouth daily.    [provider]  clopidogrel (PLAVIX) 75 MG tablet TAKE 1 TABLET BY MOUTH EVERY DAY 11/08/17   Nahser, Wonda Cheng, MD  ezetimibe (ZETIA) 10 MG tablet Take 1 tablet (10 mg total) by mouth daily. 02/20/18 05/21/18  Nahser, Wonda Cheng, MD  levothyroxine (SYNTHROID, LEVOTHROID) 100 MCG tablet 50 mcg.  08/11/17   [provider]  loratadine (CLARITIN) 10 MG tablet Take 10 mg by mouth daily.    [provider]  metoprolol succinate (TOPROL XL) 25 MG 24 hr tablet Take 1 tablet (25 mg total) by mouth daily. 02/20/18   Nahser, Wonda Cheng, MD  ranitidine (ZANTAC) 150 MG tablet Take 150 mg by mouth 2 (two) times daily.    [provider]  rosuvastatin (CRESTOR) 40 MG tablet TAKE 1 TABLET BY MOUTH EVERY DAY 11/21/17   Richardson Dopp T, PA-C  traMADol (ULTRAM) 50 MG tablet Take 50 mg by mouth every 6 (six) hours as needed for moderate pain or severe pain.     [provider]     Family History  Problem Relation Age of Onset  . Diabetes Mother   . Hypertension Mother   . Cancer Father        pancreatic and esophageal  . Obesity Sister   . Cancer Sister        breast    Social History   Socioeconomic History  . Marital status: Divorced    Spouse name: Not on file  . Number of children: Not on file  . Years of education: Not on file  . Highest education level: Not on file  Occupational History  . Not on file  Social Needs  . Financial resource strain: Not on file  . Food insecurity:    Worry: Not on file    Inability: Not on file  . Transportation needs:    Medical: Not on file  Non-medical: Not on file  Tobacco Use  . Smoking status: Current Every Day Smoker    Packs/day: 1.00    Years: 36.00    Pack years: 36.00    Types: Cigarettes    Last attempt to quit: 08/07/2014    Years since quitting: 3.9  . Smokeless tobacco: Never Used  . Tobacco comment: smokes 1/2 pk day  Substance and Sexual Activity  . Alcohol use: Yes    Comment: occasional    (RARELY)  . Drug use: No  . Sexual activity: Not Currently  Lifestyle  . Physical activity:    Days per week: Not on file    Minutes per session: Not on file  . Stress: Not on file  Relationships  . Social connections:    Talks on phone: Not on file    Gets together: Not on file    Attends religious service: Not on file    Active member of club or organization: Not on file    Attends meetings of clubs or organizations: Not on file    Relationship status: Not on file  Other Topics Concern  . 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  All other systems are negative.  Review of Systems  Constitutional: Negative for chills and fever.  Respiratory: Negative for shortness of breath and wheezing.   Cardiovascular:  Negative for chest pain and palpitations.  Gastrointestinal:       Negative for bowel incontinence.  Genitourinary: Negative for dysuria and frequency.       Negative for bladder incontinence.  Musculoskeletal: Positive for back pain.  Neurological: Positive for numbness.  Psychiatric/Behavioral: Negative for behavioral problems and confusion.     Physical Exam Constitutional:      General: She is not in acute distress.    Appearance: Normal appearance.  Pulmonary:     Effort: Pulmonary effort is normal. No respiratory distress.  Musculoskeletal:     Comments: Mild-moderate midline back pain at approximate level of T12.  Skin:    General: Skin is warm and dry.  Neurological:     Mental Status: She is alert and oriented to person, place, and time.  Psychiatric:        Mood and Affect: Mood normal.        Behavior: Behavior normal.        Thought Content: Thought content normal.        Judgment: Judgment normal.      Imaging: Ct Chest W Contrast  Result Date: 07/09/2018 CLINICAL DATA:  Lung cancer. EXAM: CT CHEST, ABDOMEN, AND PELVIS WITH CONTRAST TECHNIQUE: Multidetector CT imaging of the chest, abdomen and pelvis was performed following the standard protocol during bolus administration of intravenous contrast. CONTRAST:  170mL OMNIPAQUE IOHEXOL 300 MG/ML  SOLN COMPARISON:  03/01/2018.  Thoracic spine radiographs 06/07/2018. FINDINGS: CT CHEST FINDINGS Cardiovascular: Atherosclerotic calcification of the aorta, aortic valve and coronary arteries. Ascending aorta measures up to 4.1 cm. Heart is at the upper limits of normal in size. No pericardial effusion. Mediastinum/Nodes: Mediastinal lymph nodes measure up to 10 mm in the prevascular space, stable. No hilar or axillary adenopathy. Esophagus is grossly unremarkable. Lungs/Pleura: Centrilobular emphysema. Subpleural scarring in the apical left upper lobe, as before. Postoperative changes in the anterior left upper lobe. Right lower  lobectomy. Lungs are otherwise clear.  No pleural fluid. Airway is otherwise unremarkable. Musculoskeletal: T12 compression deformity, as on 06/07/2018. No worrisome lytic or sclerotic lesions. CT ABDOMEN PELVIS FINDINGS Hepatobiliary: Liver is unremarkable. Cholecystectomy. No biliary ductal dilatation. Pancreas: Negative. Spleen: Negative. Adrenals/Urinary Tract: Adrenal glands and kidneys are unremarkable. Ureters are decompressed. Bladder is unremarkable. Stomach/Bowel: Stomach, small bowel and appendix are unremarkable. Stool is seen throughout the colon, indicative of constipation. Vascular/Lymphatic: Atherosclerotic calcification of the aorta without aneurysm. No pathologically enlarged lymph nodes. Reproductive: Uterus is visualized.  No adnexal mass. Other: No free fluid.  Mesenteries and peritoneum are unremarkable. Musculoskeletal: No worrisome lytic or sclerotic lesions. IMPRESSION: 1. No evidence of recurrent or metastatic disease. 2. Ascending Aortic aneurysm NOS (ICD10-I71.9). Recommend annual imaging followup by CTA or MRA. This recommendation follows 2010 ACCF/AHA/AATS/ACR/ASA/SCA/SCAI/SIR/STS/SVM Guidelines for the Diagnosis and Management of Patients with Thoracic Aortic Disease. Circulation. 2010; 121: A250-N397. Aortic aneurysm NOS (ICD10-I71.9). 3. Aortic atherosclerosis (ICD10-170.0). Coronary artery calcification. 4.  Emphysema (ICD10-J43.9). Electronically Signed   By: Lorin Picket M.D.   On: 07/09/2018 16:00   Ct Abdomen W Contrast  Result Date: 07/09/2018 CLINICAL DATA:  Lung cancer. EXAM: CT CHEST, ABDOMEN, AND PELVIS WITH CONTRAST TECHNIQUE: Multidetector CT imaging of the chest, abdomen and pelvis was performed following the standard protocol during bolus administration of intravenous contrast. CONTRAST:  159mL OMNIPAQUE IOHEXOL 300 MG/ML  SOLN COMPARISON:  03/01/2018.  Thoracic spine radiographs 06/07/2018. FINDINGS: CT CHEST FINDINGS Cardiovascular: Atherosclerotic calcification  of the aorta, aortic valve and coronary arteries. Ascending aorta measures up to 4.1 cm. Heart is at the upper limits of normal in size. No pericardial effusion. Mediastinum/Nodes: Mediastinal lymph nodes measure up to 10 mm in the prevascular space, stable. No hilar or axillary adenopathy. Esophagus is grossly unremarkable. Lungs/Pleura: Centrilobular emphysema. Subpleural scarring in the apical left upper lobe, as before. Postoperative changes in the anterior left upper lobe. Right lower lobectomy. Lungs are otherwise clear. No pleural fluid. Airway is otherwise unremarkable. Musculoskeletal: T12 compression deformity, as on 06/07/2018. No worrisome lytic or sclerotic lesions. CT ABDOMEN PELVIS FINDINGS Hepatobiliary: Liver is unremarkable. Cholecystectomy. No biliary ductal dilatation. Pancreas: Negative. Spleen: Negative. Adrenals/Urinary Tract: Adrenal glands and kidneys are unremarkable. Ureters are decompressed. Bladder is unremarkable. Stomach/Bowel: Stomach, small bowel and appendix are unremarkable. Stool is seen throughout the colon, indicative of constipation. Vascular/Lymphatic: Atherosclerotic calcification of the aorta without aneurysm. No pathologically enlarged lymph nodes. Reproductive: Uterus is visualized.  No adnexal mass. Other: No free fluid.  Mesenteries and peritoneum are unremarkable. Musculoskeletal: No worrisome lytic or sclerotic lesions. IMPRESSION: 1. No evidence of recurrent or metastatic disease. 2. Ascending Aortic aneurysm NOS (ICD10-I71.9). Recommend annual imaging followup by CTA or MRA. This recommendation follows 2010 ACCF/AHA/AATS/ACR/ASA/SCA/SCAI/SIR/STS/SVM Guidelines for the Diagnosis and Management of Patients with Thoracic Aortic Disease. Circulation. 2010; 121: Q734-L937. Aortic aneurysm NOS (ICD10-I71.9). 3. Aortic atherosclerosis (ICD10-170.0). Coronary artery calcification. 4.  Emphysema (ICD10-J43.9). Electronically Signed   By: Lorin Picket M.D.   On: 07/09/2018  16:00   Mr Lumbar Spine Wo Contrast  Result Date: 07/20/2018 CLINICAL DATA:  Low back pain, known compression fracture, lifting injury. EXAM: MRI LUMBAR SPINE WITHOUT CONTRAST TECHNIQUE: Multiplanar, multisequence MR imaging of the lumbar spine was performed. No intravenous contrast was administered. COMPARISON:  Multiple exams, including 07/09/2018 FINDINGS: Segmentation: The lowest lumbar type non-rib-bearing vertebra is labeled as L5. Alignment:  4 mm degenerative retrolisthesis at L5-S1. Vertebrae: 35% superior endplate compression fracture at T12 with associated marrow edema indicating subacute chronicity, and 3 mm posterior bony retropulsion along  the posterosuperior margin of the vertebral body. The degree of compression is not significantly increased from 07/09/2018. Type 2 degenerative endplate findings anteriorly at the T10-11 level, partially included on today's exam. There also mild type 2 degenerative endplate findings at M6-Q9, with disc desiccation and loss of disc height. Conus medullaris and cauda equina: Conus extends to the L1-2 level. Conus and cauda equina appear normal. Paraspinal and other soft tissues: Unremarkable Disc levels: T11-12: Unremarkable. T12-L1: Unremarkable. L1-2: Unremarkable. L2-3: Unremarkable. L3-4: No impingement.  Mild disc bulge. L4-5: No impingement.  Disc bulge and mild facet arthropathy. L5-S1: Borderline bilateral foraminal stenosis due to facet and intervertebral spurring along with mild disc bulge. Shallow left paracentral disc protrusion is suggested on image 8/4. IMPRESSION: 1. 35% superior endplate compression fracture at T12 appears to be subacute. There is 3 mm of posterior bony retropulsion but no impingement at this level. 2. Borderline bilateral foraminal stenosis at L5-S1 due to spondylosis and degenerative disc disease. There is also mild degenerative disc disease at L3-4 and L4-5. Electronically Signed   By: Van Clines M.D.   On: 07/20/2018  16:12    Labs:  CBC: Recent Labs    08/25/17 0814 11/27/17 0838 03/01/18 0829 07/09/18 1349  WBC 6.5 6.7 6.2 6.6  HGB 16.0* 14.5 14.8 14.9  HCT 47.7* 43.2 43.2 45.1  PLT 214 196 162 183    BMP: Recent Labs    08/25/17 0814 11/27/17 0838 03/01/18 0829 07/09/18 1349  NA 136 139 141 138  K 3.8 4.1 4.1 4.1  CL 101 106 105 102  CO2 24 26 27 26   GLUCOSE 94 88 91 96  BUN 8 8 9 7   CALCIUM 9.9 9.6 10.0 9.7  CREATININE 0.78 0.76 0.89 0.80  GFRNONAA >60 >60 >60 >60  GFRAA >60 >60 >60 >60    LIVER FUNCTION TESTS: Recent Labs    08/25/17 0814 11/27/17 0838 03/01/18 0829 07/09/18 1349  BILITOT 0.4 0.4 0.4 0.7  AST 23 23 41 26  ALT 22 18 39 18  ALKPHOS 109 106 93 89  PROT 7.8 6.8 7.5 7.3  ALBUMIN 3.8 3.6 3.7 3.8     Assessment and Plan:  T12 compression fracture. Dr. Estanislado Pandy was present for consultation. Reviewed imaging with patient. Brought to her attention was her T12 compression fracture. Informed patient of a possible procedure called an image-guided T12 kyphoplasty/vertebroplasty. Explained procedure including risks and benefits. Explained the indications for this procedure is to stabilize her fracture and decrease her pain. Patient expresses desire to move forward with procedure.  Discussed DAPT (Plavix and Aspirin use). Patient states that she is on DAPT due to coronary stents that were placed by Dr. Acie Fredrickson in 2009. Denies chest pain or dyspnea. Informed patient that in order to move forward with procedure, she will need to hold Plavix for a minimum of 5 days prior to procedure (ok to proceed with Aspirin use). Informed patient that we will send a request to Dr. Acie Fredrickson to hold Plavix.  Plan for follow-up with an image-guided T12 kyphoplasty/vertebroplasty ASAP pending cardiac clearance to hold Plavix. Informed patient that our schedulers will call her to set up this procedure pending cardiology clearance to hold Plavix.  All questions answered and concerns  addressed. Patient conveys understanding and agrees with plan.  Thank you for this interesting consult.  I greatly enjoyed meeting Sharon [name] and look forward to participating in their care.  A copy of this report was sent to the requesting provider on  this date.  Electronically Signed: Earley Abide, PA-C 07/25/2018, 8:17 AM   I spent a total of 30 Minutes in face to face in clinical consultation, greater than 50% of which was counseling/coordinating care for T12 compression

## 2018-07-30 ENCOUNTER — Other Ambulatory Visit (HOSPITAL_COMMUNITY): Payer: Self-pay | Admitting: Interventional Radiology

## 2018-07-30 DIAGNOSIS — S22080A Wedge compression fracture of T11-T12 vertebra, initial encounter for closed fracture: Secondary | ICD-10-CM

## 2018-08-01 ENCOUNTER — Telehealth: Payer: Self-pay | Admitting: *Deleted

## 2018-08-01 NOTE — Telephone Encounter (Signed)
-----   Message from Danielle Dess sent at 07/30/2018  7:57 AM EST ----- Regarding: RE: Plavix hold Yes, thank you so much! ----- Message ----- From: Ledora Bottcher, PA Sent: 07/27/2018  10:26 AM EST To: Thayer Headings, MD, Danielle Dess, # Subject: RE: Plavix hold                                Caryl Pina, Please let me know if the below correspondence with Dr. Acie Fredrickson is sufficient to proceed with surgery.   Thanks Ledora Bottcher, PA-C 07/27/2018, 10:26 AM    ----- Message ----- From: Thayer Headings, MD Sent: 07/26/2018   5:17 PM EST To: Danielle Dess, [facility] Preop Subject: RE: Plavix hold                                Clea is at low risk for her kyphoplasty She may hold the plavix for 5 days prior to procedure    Mertie Moores, MD  07/26/2018 5:17 PM     ----- Message ----- From: Danielle Dess Sent: 07/26/2018   9:22 AM EST To: Thayer Headings, MD Subject: Plavix hold                                    Dr. Acie Fredrickson,   I have to schedule this patient for a T12 kyphoplasty with Dr. Estanislado Pandy to treat a compression fracture. She will need to hold her Plavix for 5 days prior. Will it be ok to hold? If I need to fax a request I can do that also.   Thanks,  Caryl Pina  [employer]

## 2018-08-01 NOTE — Telephone Encounter (Signed)
Made encounter so messages below, preop clearance can be saved into the pts chart.

## 2018-08-09 ENCOUNTER — Other Ambulatory Visit: Payer: Self-pay | Admitting: Cardiovascular Disease

## 2018-08-13 ENCOUNTER — Other Ambulatory Visit: Payer: Self-pay | Admitting: Physician Assistant

## 2018-08-13 ENCOUNTER — Other Ambulatory Visit: Payer: Self-pay | Admitting: Radiology

## 2018-08-14 ENCOUNTER — Ambulatory Visit (HOSPITAL_COMMUNITY)
Admission: RE | Admit: 2018-08-14 | Discharge: 2018-08-14 | Disposition: A | Discharge status: Home / Self Care | Payer: Medicaid Other | Source: Ambulatory Visit | Attending: Interventional Radiology | Admitting: Interventional Radiology

## 2018-08-14 ENCOUNTER — Encounter (HOSPITAL_COMMUNITY): Payer: Self-pay

## 2018-08-14 ENCOUNTER — Other Ambulatory Visit: Payer: Self-pay

## 2018-08-14 DIAGNOSIS — Z8249 Family history of ischemic heart disease and other diseases of the circulatory system: Secondary | ICD-10-CM | POA: Diagnosis not present

## 2018-08-14 DIAGNOSIS — Z7982 Long term (current) use of aspirin: Secondary | ICD-10-CM | POA: Diagnosis not present

## 2018-08-14 DIAGNOSIS — I255 Ischemic cardiomyopathy: Secondary | ICD-10-CM | POA: Diagnosis not present

## 2018-08-14 DIAGNOSIS — F17210 Nicotine dependence, cigarettes, uncomplicated: Secondary | ICD-10-CM | POA: Insufficient documentation

## 2018-08-14 DIAGNOSIS — Z885 Allergy status to narcotic agent status: Secondary | ICD-10-CM | POA: Diagnosis not present

## 2018-08-14 DIAGNOSIS — I2510 Atherosclerotic heart disease of native coronary artery without angina pectoris: Secondary | ICD-10-CM | POA: Diagnosis not present

## 2018-08-14 DIAGNOSIS — J449 Chronic obstructive pulmonary disease, unspecified: Secondary | ICD-10-CM | POA: Insufficient documentation

## 2018-08-14 DIAGNOSIS — Z888 Allergy status to other drugs, medicaments and biological substances status: Secondary | ICD-10-CM | POA: Insufficient documentation

## 2018-08-14 DIAGNOSIS — E785 Hyperlipidemia, unspecified: Secondary | ICD-10-CM | POA: Diagnosis not present

## 2018-08-14 DIAGNOSIS — Z79899 Other long term (current) drug therapy: Secondary | ICD-10-CM | POA: Diagnosis not present

## 2018-08-14 DIAGNOSIS — M1990 Unspecified osteoarthritis, unspecified site: Secondary | ICD-10-CM | POA: Insufficient documentation

## 2018-08-14 DIAGNOSIS — Z902 Acquired absence of lung [part of]: Secondary | ICD-10-CM | POA: Insufficient documentation

## 2018-08-14 DIAGNOSIS — Z7902 Long term (current) use of antithrombotics/antiplatelets: Secondary | ICD-10-CM | POA: Insufficient documentation

## 2018-08-14 DIAGNOSIS — Z79890 Hormone replacement therapy (postmenopausal): Secondary | ICD-10-CM | POA: Insufficient documentation

## 2018-08-14 DIAGNOSIS — X58XXXA Exposure to other specified factors, initial encounter: Secondary | ICD-10-CM | POA: Insufficient documentation

## 2018-08-14 DIAGNOSIS — S22080A Wedge compression fracture of T11-T12 vertebra, initial encounter for closed fracture: Secondary | ICD-10-CM | POA: Diagnosis not present

## 2018-08-14 DIAGNOSIS — Z955 Presence of coronary angioplasty implant and graft: Secondary | ICD-10-CM | POA: Insufficient documentation

## 2018-08-14 DIAGNOSIS — I6523 Occlusion and stenosis of bilateral carotid arteries: Secondary | ICD-10-CM | POA: Diagnosis not present

## 2018-08-14 DIAGNOSIS — Z9851 Tubal ligation status: Secondary | ICD-10-CM | POA: Diagnosis not present

## 2018-08-14 HISTORY — PX: IR KYPHO THORACIC WITH BONE BIOPSY: IMG5518

## 2018-08-14 LAB — URINALYSIS, ROUTINE W REFLEX MICROSCOPIC
Bilirubin Urine: NEGATIVE
GLUCOSE, UA: NEGATIVE mg/dL
Ketones, ur: 5 mg/dL — AB
Leukocytes,Ua: NEGATIVE
Nitrite: NEGATIVE
Protein, ur: NEGATIVE mg/dL
SPECIFIC GRAVITY, URINE: 1.019 (ref 1.005–1.030)
pH: 5 (ref 5.0–8.0)

## 2018-08-14 LAB — BASIC METABOLIC PANEL
Anion gap: 10 (ref 5–15)
BUN: 11 mg/dL (ref 6–20)
CO2: 22 mmol/L (ref 22–32)
Calcium: 9.7 mg/dL (ref 8.9–10.3)
Chloride: 105 mmol/L (ref 98–111)
Creatinine, Ser: 0.86 mg/dL (ref 0.44–1.00)
GFR calc Af Amer: >60 mL/min (ref >60)
GFR calc non Af Amer: >60 mL/min (ref >60)
GLUCOSE: 90 mg/dL (ref 70–99)
Potassium: 3.8 mmol/L (ref 3.5–5.1)
Sodium: 137 mmol/L (ref 135–145)

## 2018-08-14 LAB — CBC
HCT: 47.7 % — ABNORMAL HIGH (ref 36.0–46.0)
Hemoglobin: 16 g/dL — ABNORMAL HIGH (ref 12.0–15.0)
MCH: 33.1 pg (ref 26.0–34.0)
MCHC: 33.5 g/dL (ref 30.0–36.0)
MCV: 98.8 fL (ref 80.0–100.0)
Platelets: 238 10*3/uL (ref 150–400)
RBC: 4.83 MIL/uL (ref 3.87–5.11)
RDW: 13.1 % (ref 11.5–15.5)
WBC: 7.1 10*3/uL (ref 4.0–10.5)
nRBC: 0 % (ref 0.0–0.2)

## 2018-08-14 LAB — PROTIME-INR
INR: 1 (ref 0.8–1.2)
Prothrombin Time: 12.7 seconds (ref 11.4–15.2)

## 2018-08-14 MED ORDER — HYDROMORPHONE HCL 1 MG/ML IJ SOLN
INTRAMUSCULAR | Status: AC | PRN
  Administered 2018-08-14 (×3): 1 mg via INTRAVENOUS

## 2018-08-14 MED ORDER — HYDROMORPHONE HCL 1 MG/ML IJ SOLN
INTRAMUSCULAR | Status: AC
  Filled 2018-08-14: qty 1

## 2018-08-14 MED ORDER — HYDROMORPHONE HCL 1 MG/ML IJ SOLN
INTRAMUSCULAR | Status: AC
  Filled 2018-08-14: qty 2

## 2018-08-14 MED ORDER — BUPIVACAINE HCL 0.25 % IJ SOLN
INTRAMUSCULAR | Status: AC | PRN
  Administered 2018-08-14: 15 mL

## 2018-08-14 MED ORDER — BUPIVACAINE HCL (PF) 0.5 % IJ SOLN
INTRAMUSCULAR | Status: AC
  Filled 2018-08-14: qty 30

## 2018-08-14 MED ORDER — VANCOMYCIN HCL IN DEXTROSE 1-5 GM/200ML-% IV SOLN
1000.0000 mg | Freq: Once | INTRAVENOUS | Status: AC
  Administered 2018-08-14: 1000 mg via INTRAVENOUS

## 2018-08-14 MED ORDER — SODIUM CHLORIDE 0.9 % IV SOLN: INTRAVENOUS | Status: AC

## 2018-08-14 MED ORDER — ONDANSETRON HCL 4 MG/2ML IJ SOLN
INTRAMUSCULAR | Status: AC
  Filled 2018-08-14: qty 2

## 2018-08-14 MED ORDER — ONDANSETRON HCL 4 MG/2ML IJ SOLN
INTRAMUSCULAR | Status: AC
  Administered 2018-08-14: 4 mg via INTRAVENOUS
  Filled 2018-08-14: qty 2

## 2018-08-14 MED ORDER — ONDANSETRON HCL 4 MG/2ML IJ SOLN
4.0000 mg | Freq: Once | INTRAMUSCULAR | Status: AC
  Administered 2018-08-14: 4 mg via INTRAVENOUS

## 2018-08-14 MED ORDER — MIDAZOLAM HCL 2 MG/2ML IJ SOLN
INTRAMUSCULAR | Status: AC | PRN
  Administered 2018-08-14 (×2): 1 mg via INTRAVENOUS

## 2018-08-14 MED ORDER — SODIUM CHLORIDE 0.9 % IV SOLN: INTRAVENOUS | Status: DC

## 2018-08-14 MED ORDER — MIDAZOLAM HCL 2 MG/2ML IJ SOLN
INTRAMUSCULAR | Status: AC
  Filled 2018-08-14: qty 2

## 2018-08-14 MED ORDER — IOHEXOL 300 MG/ML  SOLN: 5.0000 mL | Freq: Once | INTRAMUSCULAR | Status: DC | PRN

## 2018-08-14 MED ORDER — VANCOMYCIN HCL IN DEXTROSE 1-5 GM/200ML-% IV SOLN
INTRAVENOUS | Status: AC
  Administered 2018-08-14: 1000 mg via INTRAVENOUS
  Filled 2018-08-14: qty 200

## 2018-08-14 MED ORDER — TOBRAMYCIN SULFATE 1.2 G IJ SOLR
INTRAMUSCULAR | Status: AC
  Administered 2018-08-14: 09:00:00
  Filled 2018-08-14: qty 1.2

## 2018-08-14 MED ORDER — CEFAZOLIN SODIUM-DEXTROSE 2-4 GM/100ML-% IV SOLN: 2.0000 g | INTRAVENOUS | Status: DC

## 2018-08-14 NOTE — Sedation Documentation (Addendum)
Dr Estanislado Pandy at bedside, discussed pain medications and patients response to fentanyl (nausea and vomitting).  Pt states she tolerates dilaudid only.  Will start off with versed only and will give dilaudid if needed, per discussion with Dr. Estanislado Pandy and the patient.  Pt stated she had a reaction to to a "PCN cousin" many years ago, does not remember the name of the drug or the reaction.  Pt to receive Vanc 1gm for procedure

## 2018-08-14 NOTE — Sedation Documentation (Signed)
Decreased O2 to 1L via [place].  Will cont to monitor

## 2018-08-14 NOTE — Sedation Documentation (Signed)
O2 sat 97% on 0.5L via [place].  Turned O2 off at this time, will cont to monitor

## 2018-08-14 NOTE — Discharge Instructions (Addendum)
1.No stooping,bending  Or lifting more than 10 lbs for 2 weeks. 2.Use walker to ambulate for 2 weeks. 3.No driving for 2 weeks. 4RTC PRN 2 weeks  Balloon Kyphoplasty, Care After Refer to this sheet in the next few weeks. These instructions provide you with information about caring for yourself after your procedure. Your health care provider may also give you more specific instructions. Your treatment has been planned according to current medical practices, but problems sometimes occur. Call your health care provider if you have any problems or questions after your procedure. What can I expect after the procedure? After your procedure, it is common to have back pain. Follow these instructions at home: Incision care  Follow instructions from your health care provider about how to take care of your incisions. Make sure you: ? Wash your hands with soap and water before you change your bandage (dressing). If soap and water are not available, use hand sanitizer. ? Change your dressing as told by your health care provider. ? Leave stitches (sutures), skin glue, or adhesive strips in place. These skin closures may need to be in place for 2 weeks or longer. If adhesive strip edges start to loosen and curl up, you may trim the loose edges. Do not remove adhesive strips completely unless your health care provider tells you to do that.  Check your incision area every day for signs of infection. Watch for: ? Redness, swelling, or pain. ? Fluid, blood, or pus.  Keep your dressing dry until your health care provider says that it can be removed. Activity   Rest your back and avoid intense physical activity for as long as told by your health care provider.  Return to your normal activities as told by your health care provider. Ask your health care provider what activities are safe for you.  Do not lift anything that is heavier than 10 lb (4.5 kg). This is about the weight of a gallon of milk.You may  need to avoid heavy lifting for several weeks. General instructions  Take over-the-counter and prescription medicines only as told by your health care provider.  If directed, apply ice to the painful area: ? Put ice in a plastic bag. ? Place a towel between your skin and the bag. ? Leave the ice on for 20 minutes, 2-3 times per day.  Do not use tobacco products, including cigarettes, chewing tobacco, or e-cigarettes. If you need help quitting, ask your health care provider.  Keep all follow-up visits as told by your health care provider. This is important. Contact a health care provider if:  You have a fever.  You have redness, swelling, or pain at the site of your incisions.  You have fluid, blood, or pus coming from your incisions.  You have pain that gets worse or does not get better with medicine.  You develop numbness or weakness in any part of your body. Get help right away if:  You have chest pain.  You have difficulty breathing.  You cannot move your legs.  You cannot control your bladder or bowel movements.  You suddenly become weak or numb on one side of your body.  You become very confused.  You have trouble speaking or understanding, or both. This information is not intended to replace advice given to you by your health care provider. Make sure you discuss any questions you have with your health care provider. Document Released: 02/04/2015 Document Revised: 10/22/2015 Document Reviewed: 09/08/2014 Elsevier Interactive Patient Education  2019  [employer].   Moderate Conscious Sedation, Adult, Care After These instructions provide you with information about caring for yourself after your procedure. Your health care provider may also give you more specific instructions. Your treatment has been planned according to current medical practices, but problems sometimes occur. Call your health care provider if you have any problems or questions after your  procedure. What can I expect after the procedure? After your procedure, it is common:  To feel sleepy for several hours.  To feel clumsy and have poor balance for several hours.  To have poor judgment for several hours.  To vomit if you eat too soon. Follow these instructions at home: For at least 24 hours after the procedure:   Do not: ? Participate in activities where you could fall or become injured. ? Drive. ? Use heavy machinery. ? Drink alcohol. ? Take sleeping pills or medicines that cause drowsiness. ? Make important decisions or sign legal documents. ? Take care of children on your own.  Rest. Eating and drinking  Follow the diet recommended by your health care provider.  If you vomit: ? Drink water, juice, or soup when you can drink without vomiting. ? Make sure you have little or no nausea before eating solid foods. General instructions  Have a responsible adult stay with you until you are awake and alert.  Take over-the-counter and prescription medicines only as told by your health care provider.  If you smoke, do not smoke without supervision.  Keep all follow-up visits as told by your health care provider. This is important. Contact a health care provider if:  You keep feeling nauseous or you keep vomiting.  You feel light-headed.  You develop a rash.  You have a fever. Get help right away if:  You have trouble breathing. This information is not intended to replace advice given to you by your health care provider. Make sure you discuss any questions you have with your health care provider. Document Released: 03/06/2013 Document Revised: 10/19/2015 Document Reviewed: 09/05/2015 Elsevier Interactive Patient Education  2019 [employer].   Balloon Kyphoplasty, Care After Refer to this sheet in the next few weeks. These instructions provide you with information about caring for yourself after your procedure. Your health care provider may also give  you more specific instructions. Your treatment has been planned according to current medical practices, but problems sometimes occur. Call your health care provider if you have any problems or questions after your procedure. What can I expect after the procedure? After your procedure, it is common to have back pain. Follow these instructions at home: Incision care  Follow instructions from your health care provider about how to take care of your incisions. Make sure you: ? Wash your hands with soap and water before you change your bandage (dressing). If soap and water are not available, use hand sanitizer. ? Change your dressing as told by your health care provider. ? Leave stitches (sutures), skin glue, or adhesive strips in place. These skin closures may need to be in place for 2 weeks or longer. If adhesive strip edges start to loosen and curl up, you may trim the loose edges. Do not remove adhesive strips completely unless your health care provider tells you to do that.  Check your incision area every day for signs of infection. Watch for: ? Redness, swelling, or pain. ? Fluid, blood, or pus.  Keep your dressing dry until your health care provider says that it can be removed. Activity  Rest your back and avoid intense physical activity for as long as told by your health care provider.  Return to your normal activities as told by your health care provider. Ask your health care provider what activities are safe for you.  Do not lift anything that is heavier than 10 lb (4.5 kg). This is about the weight of a gallon of milk.You may need to avoid heavy lifting for several weeks. General instructions  Take over-the-counter and prescription medicines only as told by your health care provider.  If directed, apply ice to the painful area: ? Put ice in a plastic bag. ? Place a towel between your skin and the bag. ? Leave the ice on for 20 minutes, 2-3 times per day.  Do not use tobacco  products, including cigarettes, chewing tobacco, or e-cigarettes. If you need help quitting, ask your health care provider.  Keep all follow-up visits as told by your health care provider. This is important. Contact a health care provider if:  You have a fever.  You have redness, swelling, or pain at the site of your incisions.  You have fluid, blood, or pus coming from your incisions.  You have pain that gets worse or does not get better with medicine.  You develop numbness or weakness in any part of your body. Get help right away if:  You have chest pain.  You have difficulty breathing.  You cannot move your legs.  You cannot control your bladder or bowel movements.  You suddenly become weak or numb on one side of your body.  You become very confused.  You have trouble speaking or understanding, or both. This information is not intended to replace advice given to you by your health care provider. Make sure you discuss any questions you have with your health care provider. Document Released: 02/04/2015 Document Revised: 10/22/2015 Document Reviewed: 09/08/2014 Elsevier Interactive Patient Education  [employer].

## 2018-08-14 NOTE — Sedation Documentation (Signed)
Decreased O2 to 0.5L via [place].  Pt still needs to be reminded to wake up and breath, responds quickly with verbal stimulation.  Will cont to monitor

## 2018-08-14 NOTE — Sedation Documentation (Addendum)
Placed patient back on 2L O2 via [place], O2 sats increased to 94%.  Will cont to monitor

## 2018-08-14 NOTE — H&P (Signed)
Chief Complaint: Patient was seen in consultation today for  at the request of R [place] NP  Supervising Physician: Luanne Bras  Patient Status: [facility] - Out-pt  History of Present Illness: Sharon [name] is a 58 y.o. female   Hx Lung Ca CAD/stents  Heard loud pop in back Jan 2020 after lifting case of water Has had worsening back pain since then meds without relief  MR lumbar spine 07/20/2018: 1. 35% superior endplate compression fracture at T12 appears to be subacute. There is 3 mm of posterior bony retropulsion but no impingement at this level. 2. Borderline bilateral foraminal stenosis at L5-S1 due to spondylosis and degenerative disc disease. There is also mild degenerative disc disease at L3-4 and L4-5.  Referred to Dr Estanislado Pandy for evaluation and treatment of fracture Scheduled now for T12 KP  LD Plavix 3/11/202    Past Medical History:  Diagnosis Date   Aortic insufficiency    Echo 8/09:  EF 55-60, impaired relaxation, mild aortic stenosis (mean 8.1), moderate aortic insufficiency, trace MR, trace TR // Echo 4/18: mild LVH, EF 55-60, no RWMA, Gr 1 DD, mod AI // Echo 5/19: EF 95-28, normal diastolic function, mild aortic stenosis (mean 16, peak 30), moderate aortic insufficiency    Arthritis    Carotid artery disease ([place]) 03/09/2011   Carotid US 8/15 - R 40-59; L1-39 >> follow-up 1 year // Carotid US 4/18: ICA 1-39 bilaterally - repeat 1 yr// Carotid US 5/19: Bilateral ICA 1-39   COPD (chronic obstructive pulmonary disease) (HCC)    Coronary artery disease involving native coronary artery of native heart without angina pectoris 11/24/2009   s/p ant STEMI 5/09 >> LHC 5/09 - LM normal; LAD proximal 99; branching DX with one branch occluded and the other severely diseased; LCx okay with irregularities in the OM1; RCA proximal occluded-CTO; EF 30-40, anterior wall HK  >>  PCI: 3 x 18 mm Promus DES, 2.5 x 15 mm Promus DES to the LAD   Emphysema of lung ([place])     Headache 09/06/2016   History of acute anterior wall MI 12/2007   s/p DES x 2 to LAD   Hyperlipidemia    Ischemic cardiomyopathy    Non-small cell lung cancer ([place])    per CT CHEST/PET 2/4 and 07/18/14 @ [place]   Pneumonia    2015   HX BRONCHITIS     Tobacco user     Past Surgical History:  Procedure Laterality Date   CARDIAC CATHETERIZATION  10/15/2007   EF 30-40%   CESAREAN SECTION     CHOLECYSTECTOMY     CORONARY ANGIOPLASTY WITH STENT PLACEMENT     LAD   ectopic pregnancies requiring laparotomies     LEAD REMOVAL Left 08/14/2014   Procedure: CRYO INTERCOSTAL NERVE BLOCK;  Surgeon: Melrose Nakayama, MD;  Location: [facility];  Service: Thoracic;  Laterality: Left;   LOBECTOMY Left 08/14/2014   Procedure: LEFT LOWER LOBECTOMY;  Surgeon: Melrose Nakayama, MD;  Location: [facility];  Service: Thoracic;  Laterality: Left;   NODE DISSECTION Left 08/14/2014   Procedure: NODE DISSECTION, LEFT LOWER LOBE LUNG;  Surgeon: Melrose Nakayama, MD;  Location: [facility];  Service: Thoracic;  Laterality: Left;   SHOULDER SURGERY     LEFT X 2   (REMOVED SOME COLLAR BONE )   TRANSTHORACIC ECHOCARDIOGRAM  10/17/2007   TUBAL LIGATION     US ECHOCARDIOGRAPHY  01/07/2008   EF 55-60%   VIDEO ASSISTED THORACOSCOPY (VATS)/WEDGE RESECTION  Left 08/14/2014   Procedure: VIDEO ASSISTED THORACOSCOPY (VATS)/WEDGE RESECTION;  Surgeon: Melrose Nakayama, MD;  Location: [facility];  Service: Thoracic;  Laterality: Left;    Allergies: Codeine; Lipitor [atorvastatin calcium]; Morphine and related; Oxycodone-acetaminophen; Metoprolol; and Zetia [ezetimibe]  Medications: Prior to Admission medications   Medication Sig Start Date End Date Taking? Authorizing Provider  acetaminophen (TYLENOL) 500 MG tablet Take 1,000 mg by mouth every 6 (six) hours as needed for moderate pain or headache.   Yes [provider]  aspirin (ASPIR-LOW) 81 MG EC tablet Take 81 mg by mouth daily.  01/04/14  Yes  [provider]  cholecalciferol (VITAMIN D3) 25 MCG (1000 UT) tablet Take 2,000 Units by mouth daily.   Yes [provider]  HYDROcodone-acetaminophen (NORCO/VICODIN) 5-325 MG tablet Take 1 tablet by mouth every 6 (six) hours as needed for moderate pain.   Yes [provider]  levothyroxine (SYNTHROID, LEVOTHROID) 88 MCG tablet Take 88 mcg by mouth daily before breakfast.   Yes [provider]  rosuvastatin (CRESTOR) 40 MG tablet TAKE 1 TABLET BY MOUTH EVERY DAY Patient taking differently: Take 40 mg by mouth at bedtime.  11/21/17  Yes Richardson Dopp T, PA-C  clopidogrel (PLAVIX) 75 MG tablet Take 1 tablet (75 mg total) by mouth daily. 08/09/18   Nahser, Wonda Cheng, MD  ezetimibe (ZETIA) 10 MG tablet Take 1 tablet (10 mg total) by mouth daily. Patient not taking: Reported on 08/07/2018 02/20/18 08/07/18  Nahser, Wonda Cheng, MD  metoprolol succinate (TOPROL XL) 25 MG 24 hr tablet Take 1 tablet (25 mg total) by mouth daily. Patient not taking: Reported on 08/07/2018 02/20/18   Nahser, Wonda Cheng, MD  nicotine (NICODERM CQ - DOSED IN MG/24 HOURS) 21 mg/24hr patch Place 21 mg onto the skin daily.    [provider]     Family History  Problem Relation Age of Onset   Diabetes Mother    Hypertension Mother    Cancer Father        pancreatic and esophageal   Obesity Sister    Cancer Sister        breast    Social History   Socioeconomic History   Marital status: Divorced    Spouse name: Not on file   Number of children: Not on file   Years of education: Not on file   Highest education level: Not on file  Occupational History   Not on file  Social Needs   Financial resource strain: Not on file   Food insecurity:    Worry: Not on file    Inability: Not on file   Transportation needs:    Medical: Not on file    Non-medical: Not on file  Tobacco Use   Smoking status: Current Every Day Smoker    Packs/day: 1.00    Years: 36.00    Pack  years: 36.00    Types: Cigarettes    Last attempt to quit: 08/07/2014    Years since quitting: 4.0   Smokeless tobacco: Never Used   Tobacco comment: smokes 1/2 pk day  Substance and Sexual Activity   Alcohol use: Yes    Comment: occasional    (RARELY)   Drug use: No   Sexual activity: Not Currently  Lifestyle   Physical activity:    Days per week: Not on file    Minutes per session: Not on file   Stress: Not on file  Relationships   Social connections:    Talks  on phone: Not on file    Gets together: Not on file    Attends religious service: Not on file    Active member of club or organization: Not on file    Attends meetings of clubs or organizations: Not on file    Relationship status: Not on file  Other Topics Concern   Not on file  Social History Narrative   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  All other systems are negative.  Review of Systems  Respiratory: Negative for cough and shortness of breath.   Gastrointestinal: Negative for abdominal pain.  Musculoskeletal: Positive for back pain.  Neurological: Negative for weakness.  Psychiatric/Behavioral: Negative for behavioral problems and confusion.    Vital Signs: BP 135/66    Pulse 63    Temp 97.9 F (36.6 C) (Oral)    Resp 16    Ht 5\' 4"  (1.626 m)    Wt 145 lb (65.8 kg)    SpO2 95%    BMI 24.89 kg/m   Physical Exam Vitals signs reviewed.  Cardiovascular:     Rate and Rhythm: Normal rate.  Pulmonary:     Breath sounds: Normal breath sounds.  Abdominal:     General: Bowel sounds are normal.  Musculoskeletal: Normal range of motion.     Comments: Low back pain  Skin:    General: Skin is warm and dry.  Neurological:     Mental Status: She is alert and oriented to person, place, and time.  Psychiatric:        Mood and Affect: Mood normal.        Behavior: Behavior normal.        Thought  Content: Thought content normal.        Judgment: Judgment normal.     Imaging: Mr Lumbar Spine Wo Contrast  Result Date: 07/20/2018 CLINICAL DATA:  Low back pain, known compression fracture, lifting injury. EXAM: MRI LUMBAR SPINE WITHOUT CONTRAST TECHNIQUE: Multiplanar, multisequence MR imaging of the lumbar spine was performed. No intravenous contrast was administered. COMPARISON:  Multiple exams, including 07/09/2018 FINDINGS: Segmentation: The lowest lumbar type non-rib-bearing vertebra is labeled as L5. Alignment:  4 mm degenerative retrolisthesis at L5-S1. Vertebrae: 35% superior endplate compression fracture at T12 with associated marrow edema indicating subacute chronicity, and 3 mm posterior bony retropulsion along the posterosuperior margin of the vertebral body. The degree of compression is not significantly increased from 07/09/2018. Type 2 degenerative endplate findings anteriorly at the T10-11 level, partially included on today's exam. There also mild type 2 degenerative endplate findings at G2-X5, with disc desiccation and loss of disc height. Conus medullaris and cauda equina: Conus extends to the L1-2 level. Conus and cauda equina appear normal. Paraspinal and other soft tissues: Unremarkable Disc levels: T11-12: Unremarkable. T12-L1: Unremarkable. L1-2: Unremarkable. L2-3: Unremarkable. L3-4: No impingement.  Mild disc bulge. L4-5: No impingement.  Disc bulge and mild facet arthropathy. L5-S1: Borderline bilateral foraminal stenosis due to facet and intervertebral spurring along with mild disc bulge. Shallow left paracentral disc protrusion is suggested on image 8/4. IMPRESSION: 1. 35% superior endplate compression fracture at T12 appears to be subacute. There is 3 mm of posterior bony retropulsion but no impingement at this level. 2. Borderline bilateral foraminal stenosis at L5-S1 due to spondylosis and degenerative disc disease. There is also mild degenerative disc disease at L3-4 and  L4-5. Electronically  Signed   By: Van Clines M.D.   On: 07/20/2018 16:12    Labs:  CBC: Recent Labs    11/27/17 0838 03/01/18 0829 07/09/18 1349 08/14/18 0705  WBC 6.7 6.2 6.6 7.1  HGB 14.5 14.8 14.9 16.0*  HCT 43.2 43.2 45.1 47.7*  PLT 196 162 183 238    COAGS: Recent Labs    08/14/18 0705  INR 1.0    BMP: Recent Labs    11/27/17 0838 03/01/18 0829 07/09/18 1349 08/14/18 0705  NA 139 141 138 137  K 4.1 4.1 4.1 3.8  CL 106 105 102 105  CO2 26 27 26 22   GLUCOSE 88 91 96 90  BUN 8 9 7 11   CALCIUM 9.6 10.0 9.7 9.7  CREATININE 0.76 0.89 0.80 0.86  GFRNONAA >60 >60 >60 >60  GFRAA >60 >60 >60 >60    LIVER FUNCTION TESTS: Recent Labs    08/25/17 0814 11/27/17 0838 03/01/18 0829 07/09/18 1349  BILITOT 0.4 0.4 0.4 0.7  AST 23 23 41 26  ALT 22 18 39 18  ALKPHOS 109 106 93 89  PROT 7.8 6.8 7.5 7.3  ALBUMIN 3.8 3.6 3.7 3.8    TUMOR MARKERS: No results for input(s): AFPTM, CEA, CA199, CHROMGRNA in the last 8760 hours.  Assessment and Plan:  Thoracic 12 painful compression fracture Scheduled today for kyphoplasty Risks and benefits of T 12 KP were discussed with the patient including, but not limited to education regarding the natural healing process of compression fractures without intervention, bleeding, infection, cement migration which may cause spinal cord damage, paralysis, pulmonary embolism or even death.  This interventional procedure involves the use of X-rays and because of the nature of the planned procedure, it is possible that we will have prolonged use of X-ray fluoroscopy.  Potential radiation risks to you include (but are not limited to) the following: - A slightly elevated risk for cancer  several years later in life. This risk is typically less than 0.5% percent. This risk is low in comparison to the normal incidence of human cancer, which is 33% for women and 50% for men according to the [place]. - Radiation  induced injury can include skin redness, resembling a rash, tissue breakdown / ulcers and hair loss (which can be temporary or permanent).   The likelihood of either of these occurring depends on the difficulty of the procedure and whether you are sensitive to radiation due to previous procedures, disease, or genetic conditions.   IF your procedure requires a prolonged use of radiation, you will be notified and given written instructions for further action.  It is your responsibility to monitor the irradiated area for the 2 weeks following the procedure and to notify your physician if you are concerned that you have suffered a radiation induced injury.    All of the patient's questions were answered, patient is agreeable to proceed. Consent signed and in chart.  Thank you for this interesting consult.  I greatly enjoyed meeting Sharon [name] and look forward to participating in their care.  A copy of this report was sent to the requesting provider on this date.  Electronically Signed: Lavonia Drafts, PA-C 08/14/2018, 7:53 AM   I spent a total of    25 Minutes in face to face in clinical consultation, greater than 50% of which was counseling/coordinating care for T12 KP

## 2018-08-14 NOTE — Progress Notes (Signed)
Patient states relief from N/V.  Feels ok to go home

## 2018-08-14 NOTE — Sedation Documentation (Signed)
Patient left IR holding with transport.  No N/V at this time.

## 2018-08-14 NOTE — Sedation Documentation (Signed)
Pt had an episode of nausea and vomiting.  Notified Dr. Estanislado Pandy, verbal order received to give 4mg  IV zofran x1.

## 2018-08-14 NOTE — Sedation Documentation (Signed)
Pt positioned prone on IR table in room 2.  Cardiac monitoring applied.  O2 via [place] started

## 2018-08-14 NOTE — Progress Notes (Signed)
Patient states relief from N/V with zofran.  No vomitting or nausea since administered.  Patient ready and feels ok to go home

## 2018-08-14 NOTE — Sedation Documentation (Signed)
Pt transferred back to stretcher, supine position.  Pt to remain flat x1 hr per orders, then can elevate HOB to 45 degrees

## 2018-08-14 NOTE — Progress Notes (Signed)
Notified Set designer in Short Stay that patient in transport up to short stay room 3, and that the patient had an episode of vomiting about 20 min ago and received 4mg  IV zofran.  Paige RN to inform Amy RN

## 2018-08-14 NOTE — Sedation Documentation (Signed)
Pt awake and alert now.  Breathing freely, O2 sats ranging 92-96% on RA.  Pt denies pain at this time.  Pt OK to be transferred to [facility] at this time.  Awaiting transport

## 2018-08-14 NOTE — Progress Notes (Signed)
Patient with one episode or wretching/dry heaving

## 2018-08-14 NOTE — Procedures (Signed)
S/P T 12 KP

## 2018-08-14 NOTE — Sedation Documentation (Signed)
Notified Dr. Estanislado Pandy of pt O2 sats on RA and on 2L O2, pt to stay in IR holding until weaned off of O2

## 2018-08-17 ENCOUNTER — Encounter (HOSPITAL_COMMUNITY): Payer: Self-pay | Admitting: Interventional Radiology

## 2018-08-21 ENCOUNTER — Ambulatory Visit: Payer: BLUE CROSS/BLUE SHIELD | Admitting: Physician Assistant

## 2018-10-09 ENCOUNTER — Ambulatory Visit (HOSPITAL_COMMUNITY)
Admission: RE | Admit: 2018-10-09 | Discharge: 2018-10-09 | Disposition: A | Discharge status: Home / Self Care | Payer: Medicaid Other | Source: Ambulatory Visit | Attending: Internal Medicine | Admitting: Internal Medicine

## 2018-10-09 ENCOUNTER — Inpatient Hospital Stay: Payer: Medicaid Other | Attending: Internal Medicine

## 2018-10-09 ENCOUNTER — Other Ambulatory Visit: Payer: Self-pay

## 2018-10-09 ENCOUNTER — Inpatient Hospital Stay: Payer: Medicaid Other

## 2018-10-09 DIAGNOSIS — C3432 Malignant neoplasm of lower lobe, left bronchus or lung: Secondary | ICD-10-CM | POA: Diagnosis present

## 2018-10-09 DIAGNOSIS — C3490 Malignant neoplasm of unspecified part of unspecified bronchus or lung: Secondary | ICD-10-CM | POA: Insufficient documentation

## 2018-10-09 LAB — CMP (CANCER CENTER ONLY)
ALT: 15 U/L (ref 0–44)
AST: 18 U/L (ref 15–41)
Albumin: 3.4 g/dL — ABNORMAL LOW (ref 3.5–5.0)
Alkaline Phosphatase: 88 U/L (ref 38–126)
Anion gap: 10 (ref 5–15)
BUN: 6 mg/dL (ref 6–20)
CO2: 24 mmol/L (ref 22–32)
Calcium: 9.3 mg/dL (ref 8.9–10.3)
Chloride: 107 mmol/L (ref 98–111)
Creatinine: 0.82 mg/dL (ref 0.44–1.00)
GFR, Est AFR Am: >60 mL/min (ref >60)
GFR, Estimated: >60 mL/min (ref >60)
Glucose, Bld: 93 mg/dL (ref 70–99)
Potassium: 3.6 mmol/L (ref 3.5–5.1)
Sodium: 141 mmol/L (ref 135–145)
Total Bilirubin: 0.4 mg/dL (ref 0.3–1.2)
Total Protein: 7.1 g/dL (ref 6.5–8.1)

## 2018-10-09 LAB — CBC WITH DIFFERENTIAL (CANCER CENTER ONLY)
Abs Immature Granulocytes: 0.02 10*3/uL (ref 0.00–0.07)
Basophils Absolute: 0 10*3/uL (ref 0.0–0.1)
Basophils Relative: 1 %
Eosinophils Absolute: 0.1 10*3/uL (ref 0.0–0.5)
Eosinophils Relative: 1 %
HCT: 44.2 % (ref 36.0–46.0)
Hemoglobin: 14.7 g/dL (ref 12.0–15.0)
Immature Granulocytes: 0 %
Lymphocytes Relative: 31 %
Lymphs Abs: 2.2 10*3/uL (ref 0.7–4.0)
MCH: 32.3 pg (ref 26.0–34.0)
MCHC: 33.3 g/dL (ref 30.0–36.0)
MCV: 97.1 fL (ref 80.0–100.0)
Monocytes Absolute: 0.5 10*3/uL (ref 0.1–1.0)
Monocytes Relative: 7 %
Neutro Abs: 4.3 10*3/uL (ref 1.7–7.7)
Neutrophils Relative %: 60 %
Platelet Count: 217 10*3/uL (ref 150–400)
RBC: 4.55 MIL/uL (ref 3.87–5.11)
RDW: 13.6 % (ref 11.5–15.5)
WBC Count: 7.2 10*3/uL (ref 4.0–10.5)
nRBC: 0 % (ref 0.0–0.2)

## 2018-10-09 LAB — TSH: TSH: 0.141 u[IU]/mL — ABNORMAL LOW (ref 0.308–3.960)

## 2018-10-09 MED ORDER — SODIUM CHLORIDE (PF) 0.9 % IJ SOLN
INTRAMUSCULAR | Status: AC
  Filled 2018-10-09: qty 50

## 2018-10-09 MED ORDER — IOHEXOL 300 MG/ML  SOLN
75.0000 mL | Freq: Once | INTRAMUSCULAR | Status: AC | PRN
  Administered 2018-10-09: 12:00:00 75 mL via INTRAVENOUS

## 2018-10-11 ENCOUNTER — Inpatient Hospital Stay (HOSPITAL_BASED_OUTPATIENT_CLINIC_OR_DEPARTMENT_OTHER): Payer: Medicaid Other | Admitting: Internal Medicine

## 2018-10-11 ENCOUNTER — Other Ambulatory Visit: Payer: Self-pay

## 2018-10-11 VITALS — BP 132/69 | HR 83 | Temp 98.7°F | Resp 18 | Ht 64.0 in | Wt 142.2 lb

## 2018-10-11 DIAGNOSIS — C3490 Malignant neoplasm of unspecified part of unspecified bronchus or lung: Secondary | ICD-10-CM

## 2018-10-11 DIAGNOSIS — C3432 Malignant neoplasm of lower lobe, left bronchus or lung: Secondary | ICD-10-CM | POA: Diagnosis not present

## 2018-10-11 DIAGNOSIS — C3412 Malignant neoplasm of upper lobe, left bronchus or lung: Secondary | ICD-10-CM

## 2018-10-11 NOTE — Progress Notes (Signed)
[name] Telephone:(336) [phone]   Fax:(336) [place], NP [place] [address]  DIAGNOSIS: Recurrent non-small cell lung cancer, adenocarcinoma with PDL 1 expression of 60% diagnosed in November 2016. The patient was initially diagnosed as stage IB (T2a, N0, M0) adenocarcinoma in March 2016.  PRIOR THERAPY:   1) Status post left video-assisted thoracoscopy, wedge resection left lower lobe, thoracoscopic left lower lobectomy and mediastinal lymph node dissection on 08/14/2014.  2) Immunotherapy with Ketruda (pembrolizumab) 200 mg IV every 3 weeks status post 35 cycles. First dose was given 05/12/2015.  This was discontinued after the patient completed 2 years of treatment.  CURRENT THERAPY: Observation.   INTERVAL HISTORY: Sharon [name] 58 y.o. female returns to the clinic today for follow-up visit.  The patient is feeling fine today with no concerning complaints.  She intentionally lost few pounds since her last visit.  She denied having any chest pain, shortness of breath, cough or hemoptysis.  She denied having any fever or chills.  She has no nausea, vomiting, diarrhea or constipation.  She has no headache or visual changes.  The patient had repeat CT scan of the chest performed recently and she is here for evaluation and discussion of her scan results.  MEDICAL HISTORY: Past Medical History:  Diagnosis Date  . Aortic insufficiency    Echo 8/09:  EF 55-60, impaired relaxation, mild aortic stenosis (mean 8.1), moderate aortic insufficiency, trace MR, trace TR // Echo 4/18: mild LVH, EF 55-60, no RWMA, Gr 1 DD, mod AI // Echo 5/19: EF 93-26, normal diastolic function, mild aortic stenosis (mean 16, peak 30), moderate aortic insufficiency   . Arthritis   . Carotid artery disease ([place]) 03/09/2011   Carotid US 8/15 - R 40-59; L1-39 >> follow-up 1 year // Carotid US 4/18: ICA 1-39 bilaterally - repeat 1 yr// Carotid  US 5/19: Bilateral ICA 1-39  . COPD (chronic obstructive pulmonary disease) ([place])   . Coronary artery disease involving native coronary artery of native heart without angina pectoris 11/24/2009   s/p ant STEMI 5/09 >> LHC 5/09 - LM normal; LAD proximal 99; branching DX with one branch occluded and the other severely diseased; LCx okay with irregularities in the OM1; RCA proximal occluded-CTO; EF 30-40, anterior wall HK  >>  PCI: 3 x 18 mm Promus DES, 2.5 x 15 mm Promus DES to the LAD  . Emphysema of lung ([place])   . Headache 09/06/2016  . History of acute anterior wall MI 12/2007   s/p DES x 2 to LAD  . Hyperlipidemia   . Ischemic cardiomyopathy   . Non-small cell lung cancer ([facility])    per CT CHEST/PET 2/4 and 07/18/14 @ [facility]  . Pneumonia    2015   HX BRONCHITIS    . Tobacco user     ALLERGIES:  is allergic to codeine; lipitor [atorvastatin calcium]; morphine and related; oxycodone-acetaminophen; metoprolol; and zetia [ezetimibe].  MEDICATIONS:  Current Outpatient Medications  Medication Sig Dispense Refill  . acetaminophen (TYLENOL) 500 MG tablet Take 1,000 mg by mouth every 6 (six) hours as needed for moderate pain or headache.    Marland Kitchen aspirin (ASPIR-LOW) 81 MG EC tablet Take 81 mg by mouth daily.     . cholecalciferol (VITAMIN D3) 25 MCG (1000 UT) tablet Take 2,000 Units by mouth daily.    . clopidogrel (PLAVIX) 75 MG tablet Take 1 tablet (75 mg total)  by mouth daily. 90 tablet 1  . ezetimibe (ZETIA) 10 MG tablet Take 1 tablet (10 mg total) by mouth daily. (Patient not taking: Reported on 08/07/2018) 90 tablet 3  . HYDROcodone-acetaminophen (NORCO/VICODIN) 5-325 MG tablet Take 1 tablet by mouth every 6 (six) hours as needed for moderate pain.    Marland Kitchen levothyroxine (SYNTHROID, LEVOTHROID) 88 MCG tablet Take 88 mcg by mouth daily before breakfast.    . metoprolol succinate (TOPROL XL) 25 MG 24 hr tablet Take 1 tablet (25 mg total) by mouth daily. (Patient not taking: Reported on 08/07/2018) 90  tablet 3  . nicotine (NICODERM CQ - DOSED IN MG/24 HOURS) 21 mg/24hr patch Place 21 mg onto the skin daily.    . rosuvastatin (CRESTOR) 40 MG tablet TAKE 1 TABLET BY MOUTH EVERY DAY (Patient taking differently: Take 40 mg by mouth at bedtime. ) 90 tablet 3   No current facility-administered medications for this visit.     SURGICAL HISTORY:  Past Surgical History:  Procedure Laterality Date  . CARDIAC CATHETERIZATION  10/15/2007   EF 30-40%  . CESAREAN SECTION    . CHOLECYSTECTOMY    . CORONARY ANGIOPLASTY WITH STENT PLACEMENT     LAD  . ectopic pregnancies requiring laparotomies    . IR KYPHO THORACIC WITH BONE BIOPSY  08/14/2018  . LEAD REMOVAL Left 08/14/2014   Procedure: CRYO INTERCOSTAL NERVE BLOCK;  Surgeon: Melrose Nakayama, MD;  Location: [facility];  Service: Thoracic;  Laterality: Left;  . LOBECTOMY Left 08/14/2014   Procedure: LEFT LOWER LOBECTOMY;  Surgeon: Melrose Nakayama, MD;  Location: [facility];  Service: Thoracic;  Laterality: Left;  . NODE DISSECTION Left 08/14/2014   Procedure: NODE DISSECTION, LEFT LOWER LOBE LUNG;  Surgeon: Melrose Nakayama, MD;  Location: [facility];  Service: Thoracic;  Laterality: Left;  . SHOULDER SURGERY     LEFT X 2   (REMOVED SOME COLLAR BONE )  . TRANSTHORACIC ECHOCARDIOGRAM  10/17/2007  . TUBAL LIGATION    . US ECHOCARDIOGRAPHY  01/07/2008   EF 55-60%  . VIDEO ASSISTED THORACOSCOPY (VATS)/WEDGE RESECTION Left 08/14/2014   Procedure: VIDEO ASSISTED THORACOSCOPY (VATS)/WEDGE RESECTION;  Surgeon: Melrose Nakayama, MD;  Location: [facility];  Service: Thoracic;  Laterality: Left;    REVIEW OF SYSTEMS:  A comprehensive review of systems was negative.   PHYSICAL EXAMINATION: General appearance: alert, cooperative and no distress Head: Normocephalic, without obvious abnormality, atraumatic Neck: no adenopathy, no JVD, supple, symmetrical, trachea midline and thyroid not enlarged, symmetric, no tenderness/mass/nodules Lymph nodes: Cervical,  supraclavicular, and axillary nodes normal. Resp: clear to auscultation bilaterally Back: negative Cardio: regular rate and rhythm, S1, S2 normal, no murmur, click, rub or gallop GI: soft, non-tender; bowel sounds normal; no masses,  no organomegaly Extremities: extremities normal, atraumatic, no cyanosis or edema  ECOG PERFORMANCE STATUS: 1 - Symptomatic but completely ambulatory  Blood pressure 132/69, pulse 83, temperature 98.7 F (37.1 C), temperature source Oral, resp. rate 18, height 5\' 4"  (1.626 m), weight 142 lb 3.2 oz (64.5 kg), SpO2 99 %.  LABORATORY DATA: Lab Results  Component Value Date   WBC 7.2 10/09/2018   HGB 14.7 10/09/2018   HCT 44.2 10/09/2018   MCV 97.1 10/09/2018   PLT 217 10/09/2018      Chemistry      Component Value Date/Time   NA 141 10/09/2018 1031   NA 139 05/25/2017 1344   K 3.6 10/09/2018 1031   K 3.5 05/25/2017 1344   CL 107  10/09/2018 1031   CO2 24 10/09/2018 1031   CO2 23 05/25/2017 1344   BUN 6 10/09/2018 1031   BUN 10.0 05/25/2017 1344   CREATININE 0.82 10/09/2018 1031   CREATININE 0.7 05/25/2017 1344      Component Value Date/Time   CALCIUM 9.3 10/09/2018 1031   CALCIUM 9.4 05/25/2017 1344   ALKPHOS 88 10/09/2018 1031   ALKPHOS 94 05/25/2017 1344   AST 18 10/09/2018 1031   AST 23 05/25/2017 1344   ALT 15 10/09/2018 1031   ALT 20 05/25/2017 1344   BILITOT 0.4 10/09/2018 1031   BILITOT 0.38 05/25/2017 1344       RADIOGRAPHIC STUDIES: Ct Chest W Contrast  Result Date: 10/09/2018 CLINICAL DATA:  Non-small cell lung cancer EXAM: CT CHEST WITH CONTRAST TECHNIQUE: Multidetector CT imaging of the chest was performed during intravenous contrast administration. CONTRAST:  66mL OMNIPAQUE IOHEXOL 300 MG/ML  SOLN COMPARISON:  07/09/2018 FINDINGS: Cardiovascular: Heart is normal in size.  No pericardial effusion. Ectasia of the ascending thoracic aorta, measuring 3.9 cm. Atherosclerotic calcifications of the aortic arch. Three vessel  coronary atherosclerosis. Mediastinum/Nodes: Stable small thoracic lymph nodes, including: --Adjacent high right paratracheal nodes measuring up to 10 mm short axis (series 2/image 31) --10 mm short axis prevascular node (series 2/image 53) --7 mm short axis subcarinal node (series 2/image 55) Visualized thyroid is unremarkable. Lungs/Pleura: Status post left lower lobectomy. Stable scarring posteriorly in the left upper lobe at the base (series 7/image 102), unchanged. No suspicious pulmonary nodules. Mild centrilobular and paraseptal emphysematous changes. Mild dependent atelectasis in the right lower lobe. No focal consolidation. Mild atelectasis in the medial right lower lobe. No pleural effusion or pneumothorax. Upper Abdomen: Visualized upper abdomen is notable for prior cholecystectomy and vascular calcifications. Musculoskeletal: Interval vertebral augmentation at T12. IMPRESSION: Status post left lower lobectomy. Stable thoracic lymph nodes. No evidence of recurrent or metastatic disease. Interval vertebral augmentation at T12. Aortic Atherosclerosis (ICD10-I70.0) and Emphysema (ICD10-J43.9). Electronically Signed   By: Julian Hy M.D.   On: 10/09/2018 16:53    ASSESSMENT AND PLAN:  This is a very pleasant 58 years old white female with recurrent non-small cell lung cancer, adenocarcinoma with positive PDL 1 expression of 60%. The patient completed treatment with immunotherapy with Ketruda 200 mg IV every 3 weeks status post 35 cycles. The patient tolerated her previous treatment well. She is currently on observation and feeling fine. She had repeat CT scan of the chest performed recently.  I personally and independently reviewed the scans and discussed the results with the patient today. Her scan showed no concerning findings for disease recurrence or progression. I recommended for the patient to continue on observation with repeat CT scan of the chest in 6 months. She was advised to call  immediately if she has any concerning symptoms in the interval. The patient voices understanding of current disease status and treatment options and is in agreement with the current care plan. All questions were answered. The patient knows to call the clinic with any problems, questions or concerns. We can certainly see the patient much sooner if necessary.  Disclaimer: This note was dictated with voice recognition software. Similar sounding words can inadvertently be transcribed and may not be corrected upon review.

## 2018-10-12 ENCOUNTER — Encounter: Payer: Self-pay | Admitting: Internal Medicine

## 2018-10-12 ENCOUNTER — Telehealth: Payer: Self-pay | Admitting: Internal Medicine

## 2018-10-12 NOTE — Telephone Encounter (Signed)
Scheduled appt per 5/14 los - pt to get an updated schedule in the mail. F/u in 6 months.

## 2018-12-07 ENCOUNTER — Telehealth: Payer: Self-pay | Admitting: *Deleted

## 2018-12-07 NOTE — Telephone Encounter (Signed)
Lvm to call back to discuss labs and reccomendations

## 2018-12-10 ENCOUNTER — Other Ambulatory Visit: Payer: Self-pay | Admitting: Cardiovascular Disease

## 2018-12-17 NOTE — Telephone Encounter (Signed)
Lvm for patients  to call back to verify Crestor and Zetia and if taking due to wanting to refer to Lipid clinic.

## 2019-03-04 ENCOUNTER — Other Ambulatory Visit: Payer: Self-pay | Admitting: Physician Assistant

## 2019-03-04 ENCOUNTER — Other Ambulatory Visit: Payer: Self-pay | Admitting: Cardiovascular Disease

## 2019-03-04 DIAGNOSIS — E785 Hyperlipidemia, unspecified: Secondary | ICD-10-CM

## 2019-04-16 ENCOUNTER — Inpatient Hospital Stay: Payer: Medicare Other | Attending: Internal Medicine

## 2019-04-16 ENCOUNTER — Other Ambulatory Visit: Payer: Self-pay

## 2019-04-16 ENCOUNTER — Ambulatory Visit (HOSPITAL_COMMUNITY)
Admission: RE | Admit: 2019-04-16 | Discharge: 2019-04-16 | Disposition: A | Discharge status: Home / Self Care | Payer: Medicare Other | Source: Ambulatory Visit | Attending: Internal Medicine | Admitting: Internal Medicine

## 2019-04-16 DIAGNOSIS — Z902 Acquired absence of lung [part of]: Secondary | ICD-10-CM | POA: Diagnosis not present

## 2019-04-16 DIAGNOSIS — C3490 Malignant neoplasm of unspecified part of unspecified bronchus or lung: Secondary | ICD-10-CM | POA: Insufficient documentation

## 2019-04-16 DIAGNOSIS — C3432 Malignant neoplasm of lower lobe, left bronchus or lung: Secondary | ICD-10-CM | POA: Insufficient documentation

## 2019-04-16 DIAGNOSIS — E039 Hypothyroidism, unspecified: Secondary | ICD-10-CM | POA: Diagnosis not present

## 2019-04-16 LAB — CMP (CANCER CENTER ONLY)
ALT: 13 U/L (ref 0–44)
AST: 20 U/L (ref 15–41)
Albumin: 3.8 g/dL (ref 3.5–5.0)
Alkaline Phosphatase: 89 U/L (ref 38–126)
Anion gap: 11 (ref 5–15)
BUN: 7 mg/dL (ref 6–20)
CO2: 24 mmol/L (ref 22–32)
Calcium: 9.3 mg/dL (ref 8.9–10.3)
Chloride: 106 mmol/L (ref 98–111)
Creatinine: 0.78 mg/dL (ref 0.44–1.00)
GFR, Est AFR Am: >60 mL/min (ref >60)
GFR, Estimated: >60 mL/min (ref >60)
Glucose, Bld: 93 mg/dL (ref 70–99)
Potassium: 4.1 mmol/L (ref 3.5–5.1)
Sodium: 141 mmol/L (ref 135–145)
Total Bilirubin: 0.4 mg/dL (ref 0.3–1.2)
Total Protein: 7.3 g/dL (ref 6.5–8.1)

## 2019-04-16 LAB — CBC WITH DIFFERENTIAL (CANCER CENTER ONLY)
Abs Immature Granulocytes: 0.01 10*3/uL (ref 0.00–0.07)
Basophils Absolute: 0 10*3/uL (ref 0.0–0.1)
Basophils Relative: 1 %
Eosinophils Absolute: 0.1 10*3/uL (ref 0.0–0.5)
Eosinophils Relative: 1 %
HCT: 46.6 % — ABNORMAL HIGH (ref 36.0–46.0)
Hemoglobin: 15.5 g/dL — ABNORMAL HIGH (ref 12.0–15.0)
Immature Granulocytes: 0 %
Lymphocytes Relative: 35 %
Lymphs Abs: 2 10*3/uL (ref 0.7–4.0)
MCH: 33.2 pg (ref 26.0–34.0)
MCHC: 33.3 g/dL (ref 30.0–36.0)
MCV: 99.8 fL (ref 80.0–100.0)
Monocytes Absolute: 0.5 10*3/uL (ref 0.1–1.0)
Monocytes Relative: 8 %
Neutro Abs: 3.3 10*3/uL (ref 1.7–7.7)
Neutrophils Relative %: 55 %
Platelet Count: 215 10*3/uL (ref 150–400)
RBC: 4.67 MIL/uL (ref 3.87–5.11)
RDW: 14 % (ref 11.5–15.5)
WBC Count: 5.9 10*3/uL (ref 4.0–10.5)
nRBC: 0 % (ref 0.0–0.2)

## 2019-04-16 MED ORDER — IOHEXOL 300 MG/ML  SOLN
75.0000 mL | Freq: Once | INTRAMUSCULAR | Status: AC | PRN
  Administered 2019-04-16: 75 mL via INTRAVENOUS

## 2019-04-16 MED ORDER — SODIUM CHLORIDE (PF) 0.9 % IJ SOLN
INTRAMUSCULAR | Status: AC
  Filled 2019-04-16: qty 50

## 2019-04-18 ENCOUNTER — Encounter: Payer: Self-pay | Admitting: Internal Medicine

## 2019-04-18 ENCOUNTER — Other Ambulatory Visit: Payer: Self-pay

## 2019-04-18 ENCOUNTER — Inpatient Hospital Stay (HOSPITAL_BASED_OUTPATIENT_CLINIC_OR_DEPARTMENT_OTHER): Payer: Medicare Other | Admitting: Internal Medicine

## 2019-04-18 ENCOUNTER — Telehealth: Payer: Self-pay | Admitting: Internal Medicine

## 2019-04-18 ENCOUNTER — Encounter: Payer: Self-pay | Admitting: *Deleted

## 2019-04-18 VITALS — BP 128/60 | HR 63 | Temp 98.2°F | Resp 17 | Ht 64.0 in | Wt 132.7 lb

## 2019-04-18 DIAGNOSIS — C3432 Malignant neoplasm of lower lobe, left bronchus or lung: Secondary | ICD-10-CM | POA: Diagnosis not present

## 2019-04-18 DIAGNOSIS — E039 Hypothyroidism, unspecified: Secondary | ICD-10-CM | POA: Diagnosis not present

## 2019-04-18 DIAGNOSIS — C3490 Malignant neoplasm of unspecified part of unspecified bronchus or lung: Secondary | ICD-10-CM

## 2019-04-18 DIAGNOSIS — C3412 Malignant neoplasm of upper lobe, left bronchus or lung: Secondary | ICD-10-CM

## 2019-04-18 NOTE — Progress Notes (Signed)
Oncology Nurse Navigator Documentation  Oncology Nurse Navigator Flowsheets 04/18/2019  Navigator Location [facility]  Navigator Encounter Type Clinic/MDC  Patient Visit Type -  Treatment Phase -  Barriers/Navigation Needs Education/provided education on MyChart.    Education Other  Interventions Education  Acuity Level 2-Minimal Needs (1-2 Barriers Identified)  Education Method Verbal  Time Spent with Patient 15

## 2019-04-18 NOTE — Telephone Encounter (Signed)
Scheduled per 11/19 los, patient will receive updates on My chart. °

## 2019-04-18 NOTE — Progress Notes (Signed)
[facility] Telephone:([phone]   Fax:(336) [place], NP [place] [address]  DIAGNOSIS: Recurrent non-small cell lung cancer, adenocarcinoma with PDL 1 expression of 60% diagnosed in November 2016. The patient was initially diagnosed as stage IB (T2a, N0, M0) adenocarcinoma in March 2016.  PRIOR THERAPY:   1) Status post left video-assisted thoracoscopy, wedge resection left lower lobe, thoracoscopic left lower lobectomy and mediastinal lymph node dissection on 08/14/2014.  2) Immunotherapy with Ketruda (pembrolizumab) 200 mg IV every 3 weeks status post 35 cycles. First dose was given 05/12/2015.  This was discontinued after the patient completed 2 years of treatment.  CURRENT THERAPY: Observation.   INTERVAL HISTORY: Sharon [name] 58 y.o. female returns to the clinic today for follow-up visit.  The patient is feeling fine today with no concerning complaints.  She lost around 10 pounds since her last visit because she got on sweets and carbohydrates.  The patient denied having any current chest pain, shortness of breath, cough or hemoptysis.  She denied having any fever or chills.  She has no nausea, vomiting, diarrhea or constipation.  She has no headache or visual changes.  She had repeat CT scan of the chest performed recently and she is here for evaluation and discussion of her scan results.   MEDICAL HISTORY: Past Medical History:  Diagnosis Date  . Aortic insufficiency    Echo 8/09:  EF 55-60, impaired relaxation, mild aortic stenosis (mean 8.1), moderate aortic insufficiency, trace MR, trace TR // Echo 4/18: mild LVH, EF 55-60, no RWMA, Gr 1 DD, mod AI // Echo 5/19: EF 25-05, normal diastolic function, mild aortic stenosis (mean 16, peak 30), moderate aortic insufficiency   . Arthritis   . Carotid artery disease ([facility]) 03/09/2011   Carotid US 8/15 - R 40-59; L1-39 >> follow-up 1 year // Carotid US 4/18:  ICA 1-39 bilaterally - repeat 1 yr// Carotid US 5/19: Bilateral ICA 1-39  . COPD (chronic obstructive pulmonary disease) ([facility])   . Coronary artery disease involving native coronary artery of native heart without angina pectoris 11/24/2009   s/p ant STEMI 5/09 >> LHC 5/09 - LM normal; LAD proximal 99; branching DX with one branch occluded and the other severely diseased; LCx okay with irregularities in the OM1; RCA proximal occluded-CTO; EF 30-40, anterior wall HK  >>  PCI: 3 x 18 mm Promus DES, 2.5 x 15 mm Promus DES to the LAD  . Emphysema of lung ([facility])   . Headache 09/06/2016  . History of acute anterior wall MI 12/2007   s/p DES x 2 to LAD  . Hyperlipidemia   . Ischemic cardiomyopathy   . Non-small cell lung cancer ([facility])    per CT CHEST/PET 2/4 and 07/18/14 @ [facility]  . Pneumonia    2015   HX BRONCHITIS    . Tobacco user     ALLERGIES:  is allergic to codeine; lipitor [atorvastatin calcium]; morphine and related; oxycodone-acetaminophen; metoprolol; and zetia [ezetimibe].  MEDICATIONS:  Current Outpatient Medications  Medication Sig Dispense Refill  . acetaminophen (TYLENOL) 500 MG tablet Take 1,000 mg by mouth every 6 (six) hours as needed for moderate pain or headache.    Marland Kitchen aspirin (ASPIR-LOW) 81 MG EC tablet Take 81 mg by mouth daily.     . cholecalciferol (VITAMIN D3) 25 MCG (1000 UT) tablet Take 2,000 Units by mouth daily.    . clopidogrel (PLAVIX)  75 MG tablet Take 1 tablet (75 mg total) by mouth daily. Please call and schedule an appointment for further refills 1st attempt 30 tablet 0  . levothyroxine (SYNTHROID, LEVOTHROID) 88 MCG tablet Take 88 mcg by mouth daily before breakfast.    . rosuvastatin (CRESTOR) 40 MG tablet Take 1 tablet (40 mg total) by mouth daily. Pt needs to call and make appt for further refills - 1st attempt 30 tablet 0   No current facility-administered medications for this visit.     SURGICAL HISTORY:  Past Surgical History:  Procedure Laterality Date   . CARDIAC CATHETERIZATION  10/15/2007   EF 30-40%  . CESAREAN SECTION    . CHOLECYSTECTOMY    . CORONARY ANGIOPLASTY WITH STENT PLACEMENT     LAD  . ectopic pregnancies requiring laparotomies    . IR KYPHO THORACIC WITH BONE BIOPSY  08/14/2018  . LEAD REMOVAL Left 08/14/2014   Procedure: CRYO INTERCOSTAL NERVE BLOCK;  Surgeon: Melrose Nakayama, MD;  Location: [facility];  Service: Thoracic;  Laterality: Left;  . LOBECTOMY Left 08/14/2014   Procedure: LEFT LOWER LOBECTOMY;  Surgeon: Melrose Nakayama, MD;  Location: [facility];  Service: Thoracic;  Laterality: Left;  . NODE DISSECTION Left 08/14/2014   Procedure: NODE DISSECTION, LEFT LOWER LOBE LUNG;  Surgeon: Melrose Nakayama, MD;  Location: [facility];  Service: Thoracic;  Laterality: Left;  . SHOULDER SURGERY     LEFT X 2   (REMOVED SOME COLLAR BONE )  . TRANSTHORACIC ECHOCARDIOGRAM  10/17/2007  . TUBAL LIGATION    . US ECHOCARDIOGRAPHY  01/07/2008   EF 55-60%  . VIDEO ASSISTED THORACOSCOPY (VATS)/WEDGE RESECTION Left 08/14/2014   Procedure: VIDEO ASSISTED THORACOSCOPY (VATS)/WEDGE RESECTION;  Surgeon: Melrose Nakayama, MD;  Location: [facility];  Service: Thoracic;  Laterality: Left;    REVIEW OF SYSTEMS:  A comprehensive review of systems was negative except for: Constitutional: positive for weight loss   PHYSICAL EXAMINATION: General appearance: alert, cooperative and no distress Head: Normocephalic, without obvious abnormality, atraumatic Neck: no adenopathy, no JVD, supple, symmetrical, trachea midline and thyroid not enlarged, symmetric, no tenderness/mass/nodules Lymph nodes: Cervical, supraclavicular, and axillary nodes normal. Resp: clear to auscultation bilaterally Back: negative Cardio: regular rate and rhythm, S1, S2 normal, no murmur, click, rub or gallop GI: soft, non-tender; bowel sounds normal; no masses,  no organomegaly Extremities: extremities normal, atraumatic, no cyanosis or edema  ECOG PERFORMANCE STATUS: 1 -  Symptomatic but completely ambulatory  Blood pressure 128/60, pulse 63, temperature 98.2 F (36.8 C), temperature source Temporal, resp. rate 17, height 5\' 4"  (1.626 m), weight 132 lb 11.2 oz (60.2 kg), SpO2 98 %.  LABORATORY DATA: Lab Results  Component Value Date   WBC 5.9 04/16/2019   HGB 15.5 (H) 04/16/2019   HCT 46.6 (H) 04/16/2019   MCV 99.8 04/16/2019   PLT 215 04/16/2019      Chemistry      Component Value Date/Time   NA 141 04/16/2019 0927   NA 139 05/25/2017 1344   K 4.1 04/16/2019 0927   K 3.5 05/25/2017 1344   CL 106 04/16/2019 0927   CO2 24 04/16/2019 0927   CO2 23 05/25/2017 1344   BUN 7 04/16/2019 0927   BUN 10.0 05/25/2017 1344   CREATININE 0.78 04/16/2019 0927   CREATININE 0.7 05/25/2017 1344      Component Value Date/Time   CALCIUM 9.3 04/16/2019 0927   CALCIUM 9.4 05/25/2017 1344   ALKPHOS 89 04/16/2019 0927  ALKPHOS 94 05/25/2017 1344   AST 20 04/16/2019 0927   AST 23 05/25/2017 1344   ALT 13 04/16/2019 0927   ALT 20 05/25/2017 1344   BILITOT 0.4 04/16/2019 0927   BILITOT 0.38 05/25/2017 1344       RADIOGRAPHIC STUDIES: Ct Chest W Contrast  Result Date: 04/16/2019 CLINICAL DATA:  Follow-up lung cancer, status post immunotherapy EXAM: CT CHEST WITH CONTRAST TECHNIQUE: Multidetector CT imaging of the chest was performed during intravenous contrast administration. CONTRAST:  75mL OMNIPAQUE IOHEXOL 300 MG/ML  SOLN COMPARISON:  10/09/2018 FINDINGS: Cardiovascular: Aortic atherosclerosis. Normal heart size. Three-vessel coronary artery calcifications and/or stents. No pericardial effusion. Mediastinum/Nodes: Slight interval decrease in size of an AP window lymph node measuring 1.9 x 0.9 cm, previously 2.0 x 1.0 cm (series 2, image 56). Additional stable subcentimeter mediastinal and hilar lymph nodes. Thyroid gland, trachea, and esophagus demonstrate no significant findings. Lungs/Pleura: Moderate centrilobular and paraseptal emphysema. Status post left  lower lobectomy and left upper lobe wedge resection. Stable scarring or small consolidation of the dependent left lower lobe (series 7, image 110). No pleural effusion or pneumothorax. Upper Abdomen: No acute abnormality. Musculoskeletal: No chest wall mass or suspicious bone lesions identified. Redemonstrated kyphoplasty of T12. IMPRESSION: 1. Unchanged appearance of the chest status post left lower lobectomy and left upper lobe wedge resection. No evidence of malignant recurrence. 2. Slight interval decrease in size of an AP window lymph node measuring 1.9 x 0.9 cm, previously 2.0 x 1.0 cm (series 2, image 56). Additional stable subcentimeter mediastinal and hilar lymph nodes. Attention on follow-up. 3.  Emphysema (ICD10-J43.9). 4.  Coronary artery disease.  Aortic Atherosclerosis (ICD10-I70.0). Electronically Signed   By: Eddie Candle M.D.   On: 04/16/2019 13:13    ASSESSMENT AND PLAN:  This is a very pleasant 58 years old white female with recurrent non-small cell lung cancer, adenocarcinoma with positive PDL 1 expression of 60%. The patient completed treatment with immunotherapy with Ketruda 200 mg IV every 3 weeks status post 35 cycles. The patient is doing fine and currently on observation with no concerning complaints. She had repeat CT scan of the chest performed recently.  I personally and independently reviewed the scans and discussed the results with the patient today. Her scan showed no concerning findings for disease recurrence or progression. I recommended for her to continue on observation with repeat CT scan of the chest in 6 months. For the history of hypothyroidism, she will continue her routine follow-up visit and evaluation by her primary care provider. The patient was advised to call immediately if she has any concerning symptoms in the interval. The patient voices understanding of current disease status and treatment options and is in agreement with the current care plan. All  questions were answered. The patient knows to call the clinic with any problems, questions or concerns. We can certainly see the patient much sooner if necessary.  Disclaimer: This note was dictated with voice recognition software. Similar sounding words can inadvertently be transcribed and may not be corrected upon review.

## 2019-04-24 ENCOUNTER — Other Ambulatory Visit: Payer: Self-pay | Admitting: Cardiovascular Disease

## 2019-04-24 ENCOUNTER — Other Ambulatory Visit: Payer: Self-pay | Admitting: Physician Assistant

## 2019-04-24 DIAGNOSIS — E785 Hyperlipidemia, unspecified: Secondary | ICD-10-CM

## 2019-06-17 ENCOUNTER — Encounter: Payer: Self-pay | Admitting: Cardiovascular Disease

## 2019-06-17 ENCOUNTER — Other Ambulatory Visit: Payer: Self-pay

## 2019-06-17 ENCOUNTER — Ambulatory Visit (INDEPENDENT_AMBULATORY_CARE_PROVIDER_SITE_OTHER): Payer: Medicare Other | Admitting: Cardiovascular Disease

## 2019-06-17 VITALS — BP 122/62 | HR 71 | Ht 64.0 in | Wt 130.1 lb

## 2019-06-17 DIAGNOSIS — I7121 Aneurysm of the ascending aorta, without rupture: Secondary | ICD-10-CM

## 2019-06-17 DIAGNOSIS — I351 Nonrheumatic aortic (valve) insufficiency: Secondary | ICD-10-CM

## 2019-06-17 DIAGNOSIS — I712 Thoracic aortic aneurysm, without rupture: Secondary | ICD-10-CM

## 2019-06-17 DIAGNOSIS — I779 Disorder of arteries and arterioles, unspecified: Secondary | ICD-10-CM | POA: Diagnosis not present

## 2019-06-17 DIAGNOSIS — I2510 Atherosclerotic heart disease of native coronary artery without angina pectoris: Secondary | ICD-10-CM

## 2019-06-17 DIAGNOSIS — E785 Hyperlipidemia, unspecified: Secondary | ICD-10-CM

## 2019-06-17 MED ORDER — ROSUVASTATIN CALCIUM 40 MG PO TABS: 40.0000 mg | ORAL_TABLET | Freq: Every day | ORAL | 3 refills | Status: AC

## 2019-06-17 MED ORDER — CLOPIDOGREL BISULFATE 75 MG PO TABS: 75.0000 mg | ORAL_TABLET | Freq: Every day | ORAL | 3 refills | Status: DC

## 2019-06-17 NOTE — Patient Instructions (Addendum)
Medication Instructions:  Your physician recommends that you continue on your current medications as directed. Please refer to the Current Medication list given to you today.  *If you need a refill on your cardiac medications before your next appointment, please call your pharmacy*  Lab Work: None Ordered If you have labs (blood work) drawn today and your tests are completely normal, you will receive your results only by: Marland Kitchen MyChart Message (if you have MyChart) OR . A paper copy in the mail If you have any lab test that is abnormal or we need to change your treatment, we will call you to review the results.   Testing/Procedures: Your physician has requested that you have a carotid duplex in 6 months. This test is an ultrasound of the carotid arteries in your neck. It looks at blood flow through these arteries that supply the brain with blood. Allow one hour for this exam. There are no restrictions or special instructions.      Follow-Up: At [facility], you and your health needs are our priority.  As part of our continuing mission to provide you with exceptional heart care, we have created designated Provider Care Teams.  These Care Teams include your primary Cardiologist (physician) and Advanced Practice Providers (APPs -  Physician Assistants and Nurse Practitioners) who all work together to provide you with the care you need, when you need it.  Your next appointment:   1 year(s)  The format for your next appointment:   In Person  Provider:   You may see Mertie Moores, MD or one of the following Advanced Practice Providers on your designated Care Team:    Richardson Dopp, PA-C  [place], [place]  Daune Perch, [place]
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
## 2019-07-29 ENCOUNTER — Other Ambulatory Visit: Payer: Self-pay | Admitting: Cardiovascular Disease

## 2019-10-16 ENCOUNTER — Inpatient Hospital Stay: Payer: Medicare Other | Attending: Internal Medicine

## 2019-10-16 ENCOUNTER — Encounter (HOSPITAL_COMMUNITY): Payer: Self-pay

## 2019-10-16 ENCOUNTER — Ambulatory Visit (HOSPITAL_COMMUNITY)
Admission: RE | Admit: 2019-10-16 | Discharge: 2019-10-16 | Disposition: A | Discharge status: Home / Self Care | Payer: Medicare Other | Source: Ambulatory Visit | Attending: Internal Medicine | Admitting: Internal Medicine

## 2019-10-16 ENCOUNTER — Other Ambulatory Visit: Payer: Self-pay

## 2019-10-16 DIAGNOSIS — R911 Solitary pulmonary nodule: Secondary | ICD-10-CM | POA: Diagnosis not present

## 2019-10-16 DIAGNOSIS — C3490 Malignant neoplasm of unspecified part of unspecified bronchus or lung: Secondary | ICD-10-CM | POA: Diagnosis present

## 2019-10-16 DIAGNOSIS — F17210 Nicotine dependence, cigarettes, uncomplicated: Secondary | ICD-10-CM | POA: Diagnosis not present

## 2019-10-16 DIAGNOSIS — C3432 Malignant neoplasm of lower lobe, left bronchus or lung: Secondary | ICD-10-CM | POA: Insufficient documentation

## 2019-10-16 LAB — CBC WITH DIFFERENTIAL (CANCER CENTER ONLY)
Abs Immature Granulocytes: 0.02 10*3/uL (ref 0.00–0.07)
Basophils Absolute: 0.1 10*3/uL (ref 0.0–0.1)
Basophils Relative: 1 %
Eosinophils Absolute: 0.1 10*3/uL (ref 0.0–0.5)
Eosinophils Relative: 1 %
HCT: 47.9 % — ABNORMAL HIGH (ref 36.0–46.0)
Hemoglobin: 16 g/dL — ABNORMAL HIGH (ref 12.0–15.0)
Immature Granulocytes: 0 %
Lymphocytes Relative: 27 %
Lymphs Abs: 2.5 10*3/uL (ref 0.7–4.0)
MCH: 32.6 pg (ref 26.0–34.0)
MCHC: 33.4 g/dL (ref 30.0–36.0)
MCV: 97.6 fL (ref 80.0–100.0)
Monocytes Absolute: 0.8 10*3/uL (ref 0.1–1.0)
Monocytes Relative: 8 %
Neutro Abs: 5.7 10*3/uL (ref 1.7–7.7)
Neutrophils Relative %: 63 %
Platelet Count: 228 10*3/uL (ref 150–400)
RBC: 4.91 MIL/uL (ref 3.87–5.11)
RDW: 13.7 % (ref 11.5–15.5)
WBC Count: 9.1 10*3/uL (ref 4.0–10.5)
nRBC: 0 % (ref 0.0–0.2)

## 2019-10-16 LAB — CMP (CANCER CENTER ONLY)
ALT: 17 U/L (ref 0–44)
AST: 24 U/L (ref 15–41)
Albumin: 3.6 g/dL (ref 3.5–5.0)
Alkaline Phosphatase: 76 U/L (ref 38–126)
Anion gap: 11 (ref 5–15)
BUN: 8 mg/dL (ref 6–20)
CO2: 26 mmol/L (ref 22–32)
Calcium: 9.7 mg/dL (ref 8.9–10.3)
Chloride: 103 mmol/L (ref 98–111)
Creatinine: 0.84 mg/dL (ref 0.44–1.00)
GFR, Est AFR Am: >60 mL/min (ref >60)
GFR, Estimated: >60 mL/min (ref >60)
Glucose, Bld: 98 mg/dL (ref 70–99)
Potassium: 4.1 mmol/L (ref 3.5–5.1)
Sodium: 140 mmol/L (ref 135–145)
Total Bilirubin: 0.6 mg/dL (ref 0.3–1.2)
Total Protein: 7.4 g/dL (ref 6.5–8.1)

## 2019-10-16 MED ORDER — IOHEXOL 300 MG/ML  SOLN
75.0000 mL | Freq: Once | INTRAMUSCULAR | Status: AC | PRN
  Administered 2019-10-16: 75 mL via INTRAVENOUS

## 2019-10-16 MED ORDER — SODIUM CHLORIDE (PF) 0.9 % IJ SOLN
INTRAMUSCULAR | Status: AC
  Filled 2019-10-16: qty 50

## 2019-10-23 ENCOUNTER — Inpatient Hospital Stay (HOSPITAL_BASED_OUTPATIENT_CLINIC_OR_DEPARTMENT_OTHER): Payer: Medicare Other | Admitting: Internal Medicine

## 2019-10-23 ENCOUNTER — Other Ambulatory Visit: Payer: Self-pay

## 2019-10-23 ENCOUNTER — Encounter: Payer: Self-pay | Admitting: Internal Medicine

## 2019-10-23 DIAGNOSIS — C3432 Malignant neoplasm of lower lobe, left bronchus or lung: Secondary | ICD-10-CM | POA: Diagnosis not present

## 2019-10-23 DIAGNOSIS — C3490 Malignant neoplasm of unspecified part of unspecified bronchus or lung: Secondary | ICD-10-CM

## 2019-10-23 NOTE — Progress Notes (Signed)
[facility] [phone]   Fax:([phone]) [place], NP [place] [address]  DIAGNOSIS: Recurrent non-small cell lung cancer, adenocarcinoma with PDL 1 expression of 60% diagnosed in November 2016. The patient was initially diagnosed as stage IB (T2a, N0, M0) adenocarcinoma in March 2016.  PRIOR THERAPY:   1) Status post left video-assisted thoracoscopy, wedge resection left lower lobe, thoracoscopic left lower lobectomy and mediastinal lymph node dissection on 08/14/2014.  2) Immunotherapy with Ketruda (pembrolizumab) 200 mg IV every 3 weeks status post 35 cycles. First dose was given 05/12/2015.  This was discontinued after the patient completed 2 years of treatment.  CURRENT THERAPY: Observation.   INTERVAL HISTORY: Sharon [name] 59 y.o. female returns to the clinic today for follow-up visit.  The patient is feeling fine today with no concerning complaints.  She continues to smoke 1 pack of cigarettes every day.  She denied having any current chest pain, shortness of breath, cough or hemoptysis.  She denied having any fever or chills.  She has no nausea, vomiting, diarrhea or constipation.  She denied having any headache or visual changes.  She has no recent weight loss or night sweats.  The patient is here today for evaluation with repeat CT scan of the chest for restaging of her disease.   MEDICAL HISTORY: Past Medical History:  Diagnosis Date  . Aortic insufficiency    Echo 8/09:  EF 55-60, impaired relaxation, mild aortic stenosis (mean 8.1), moderate aortic insufficiency, trace MR, trace TR // Echo 4/18: mild LVH, EF 55-60, no RWMA, Gr 1 DD, mod AI // Echo 5/19: EF 60-10, normal diastolic function, mild aortic stenosis (mean 16, peak 30), moderate aortic insufficiency   . Arthritis   . Carotid artery disease ([place]) 03/09/2011   Carotid US 8/15 - R 40-59; L1-39 >> follow-up 1 year // Carotid US 4/18: ICA  1-39 bilaterally - repeat 1 yr// Carotid US 5/19: Bilateral ICA 1-39  . COPD (chronic obstructive pulmonary disease) ([place])   . Coronary artery disease involving native coronary artery of native heart without angina pectoris 11/24/2009   s/p ant STEMI 5/09 >> LHC 5/09 - LM normal; LAD proximal 99; branching DX with one branch occluded and the other severely diseased; LCx okay with irregularities in the OM1; RCA proximal occluded-CTO; EF 30-40, anterior wall HK  >>  PCI: 3 x 18 mm Promus DES, 2.5 x 15 mm Promus DES to the LAD  . Emphysema of lung ([place])   . Headache 09/06/2016  . History of acute anterior wall MI 12/2007   s/p DES x 2 to LAD  . Hyperlipidemia   . Ischemic cardiomyopathy   . Non-small cell lung cancer ([place])    per CT CHEST/PET 2/4 and 07/18/14 @ [facility]  . Pneumonia    2015   HX BRONCHITIS    . Tobacco user     ALLERGIES:  is allergic to codeine; lipitor [atorvastatin calcium]; morphine and related; oxycodone-acetaminophen; metoprolol; and zetia [ezetimibe].  MEDICATIONS:  Current Outpatient Medications  Medication Sig Dispense Refill  . acetaminophen (TYLENOL) 500 MG tablet Take 1,000 mg by mouth every 6 (six) hours as needed for moderate pain or headache.    Marland Kitchen aspirin (ASPIR-LOW) 81 MG EC tablet Take 81 mg by mouth daily.     . cholecalciferol (VITAMIN D3) 25 MCG (1000 UT) tablet Take 2,000 Units by mouth daily.    . clopidogrel (  PLAVIX) 75 MG tablet Take 1 tablet (75 mg total) by mouth daily. 90 tablet 3  . ezetimibe (ZETIA) 10 MG tablet TAKE 1 TABLET BY MOUTH DAILY 90 tablet 3  . fluticasone (FLONASE) 50 MCG/ACT nasal spray Place 1 spray into both nostrils daily.    Marland Kitchen levothyroxine (SYNTHROID, LEVOTHROID) 88 MCG tablet Take 88 mcg by mouth daily before breakfast.    . rosuvastatin (CRESTOR) 40 MG tablet Take 1 tablet (40 mg total) by mouth daily. 90 tablet 3   No current facility-administered medications for this visit.    SURGICAL HISTORY:  Past Surgical History:    Procedure Laterality Date  . CARDIAC CATHETERIZATION  10/15/2007   EF 30-40%  . CESAREAN SECTION    . CHOLECYSTECTOMY    . CORONARY ANGIOPLASTY WITH STENT PLACEMENT     LAD  . ectopic pregnancies requiring laparotomies    . IR KYPHO THORACIC WITH BONE BIOPSY  08/14/2018  . LEAD REMOVAL Left 08/14/2014   Procedure: CRYO INTERCOSTAL NERVE BLOCK;  Surgeon: Melrose Nakayama, MD;  Location: [facility];  Service: Thoracic;  Laterality: Left;  . LOBECTOMY Left 08/14/2014   Procedure: LEFT LOWER LOBECTOMY;  Surgeon: Melrose Nakayama, MD;  Location: [facility];  Service: Thoracic;  Laterality: Left;  . NODE DISSECTION Left 08/14/2014   Procedure: NODE DISSECTION, LEFT LOWER LOBE LUNG;  Surgeon: Melrose Nakayama, MD;  Location: [facility];  Service: Thoracic;  Laterality: Left;  . SHOULDER SURGERY     LEFT X 2   (REMOVED SOME COLLAR BONE )  . TRANSTHORACIC ECHOCARDIOGRAM  10/17/2007  . TUBAL LIGATION    . US ECHOCARDIOGRAPHY  01/07/2008   EF 55-60%  . VIDEO ASSISTED THORACOSCOPY (VATS)/WEDGE RESECTION Left 08/14/2014   Procedure: VIDEO ASSISTED THORACOSCOPY (VATS)/WEDGE RESECTION;  Surgeon: Melrose Nakayama, MD;  Location: [facility];  Service: Thoracic;  Laterality: Left;    REVIEW OF SYSTEMS:  A comprehensive review of systems was negative.   PHYSICAL EXAMINATION: General appearance: alert, cooperative and no distress Head: Normocephalic, without obvious abnormality, atraumatic Neck: no adenopathy, no JVD, supple, symmetrical, trachea midline and thyroid not enlarged, symmetric, no tenderness/mass/nodules Lymph nodes: Cervical, supraclavicular, and axillary nodes normal. Resp: clear to auscultation bilaterally Back: negative Cardio: regular rate and rhythm, S1, S2 normal, no murmur, click, rub or gallop GI: soft, non-tender; bowel sounds normal; no masses,  no organomegaly Extremities: extremities normal, atraumatic, no cyanosis or edema  ECOG PERFORMANCE STATUS: 1 - Symptomatic but completely  ambulatory  Blood pressure 125/76, pulse 68, temperature 97.7 F (36.5 C), temperature source Temporal, resp. rate 18, height 5\' 4"  (1.626 m), weight 138 lb 6.4 oz (62.8 kg), SpO2 99 %.  LABORATORY DATA: Lab Results  Component Value Date   WBC 9.1 10/16/2019   HGB 16.0 (H) 10/16/2019   HCT 47.9 (H) 10/16/2019   MCV 97.6 10/16/2019   PLT 228 10/16/2019      Chemistry      Component Value Date/Time   NA 140 10/16/2019 0924   NA 139 05/25/2017 1344   K 4.1 10/16/2019 0924   K 3.5 05/25/2017 1344   CL 103 10/16/2019 0924   CO2 26 10/16/2019 0924   CO2 23 05/25/2017 1344   BUN 8 10/16/2019 0924   BUN 10.0 05/25/2017 1344   CREATININE 0.84 10/16/2019 0924   CREATININE 0.7 05/25/2017 1344      Component Value Date/Time   CALCIUM 9.7 10/16/2019 0924   CALCIUM 9.4 05/25/2017 1344   ALKPHOS 76 10/16/2019  0924   ALKPHOS 94 05/25/2017 1344   AST 24 10/16/2019 0924   AST 23 05/25/2017 1344   ALT 17 10/16/2019 0924   ALT 20 05/25/2017 1344   BILITOT 0.6 10/16/2019 0924   BILITOT 0.38 05/25/2017 1344       RADIOGRAPHIC STUDIES: CT Chest W Contrast  Result Date: 10/16/2019 CLINICAL DATA:  Primary Cancer Type: Lung Imaging Indication: Routine surveillance Interval therapy since last imaging? No Initial Cancer Diagnosis Date: 08/14/2014;  Established by: Biopsy-proven Detailed Pathology: Stage IB adenocarcinoma Primary Tumor location: Left lower lobe Recurrence? Yes; date(s) of recurrence: 04/30/2015; Established by: Biopsy-proven Surgeries: Left lower lobe wedge resection 08/14/2014 Chemotherapy: No Immunotherapy? Yes;  Type: Keytruda; Ongoing? No Radiation therapy? No EXAM: CT CHEST WITH CONTRAST TECHNIQUE: Multidetector CT imaging of the chest was performed during intravenous contrast administration. CONTRAST:  73mL OMNIPAQUE IOHEXOL 300 MG/ML  SOLN COMPARISON:  Most recent CT Chest 04/16/2019.  04/03/2015 PET-CT. FINDINGS: Cardiovascular: Normal heart size. No significant  pericardial effusion/thickening. Three-vessel coronary atherosclerosis. Atherosclerotic thoracic aorta with stable ectatic 4.2 cm ascending thoracic aorta. Normal caliber pulmonary arteries. No central pulmonary emboli. Mediastinum/Nodes: Thyroid appears atrophic or surgically absent. Unremarkable esophagus. No axillary adenopathy. Mildly enlarged 1.1 cm right paratracheal node (series 2/image 38) and mildly enlarged 1.0 cm AP window node (series 2/image 60) are both stable on multiple prior chest CT studies using similar measurement technique. No new pathologically enlarged mediastinal nodes. No pathologically enlarged hilar lymph nodes. Lungs/Pleura: No pneumothorax. No pleural effusion. Moderate centrilobular and paraseptal emphysema. Status post left lower lobectomy. No acute consolidative airspace disease or lung masses. Numerous (greater than 15) new small solid pulmonary nodules scattered throughout both lungs, largest 6 mm in left upper lobe (series 7/image 79) and 5 mm in the posterior right upper lobe (series 7/image 56). Upper abdomen: Cholecystectomy. Musculoskeletal: No aggressive appearing focal osseous lesions. Mild thoracic spondylosis. Chronic moderate T12 vertebral compression fracture status post vertebroplasty. IMPRESSION: 1. Numerous (greater than 15) new small solid pulmonary nodules scattered throughout both lungs, largest 6 mm in the left upper lobe, worrisome for pulmonary metastases. These nodules are below PET resolution. Recommend attention on follow-up chest CT in 3 months. 2. Mildly enlarged mediastinal lymph nodes are unchanged compared to multiple prior chest CT studies, favoring reactive etiology. 3. Stable ectatic 4.2 cm ascending thoracic aorta. Recommend annual imaging followup by CTA or MRA. This recommendation follows 2010 ACCF/AHA/AATS/ACR/ASA/SCA/SCAI/SIR/STS/SVM Guidelines for the Diagnosis and Management of Patients with Thoracic Aortic Disease. Circulation. 2010; 121:  [id number]. Aortic aneurysm NOS (ICD10-I71.9). 4. Aortic Atherosclerosis (ICD10-I70.0) and Emphysema (ICD10-J43.9). Electronically Signed   By: Ilona Sorrel M.D.   On: 10/16/2019 11:18    ASSESSMENT AND PLAN:  This is a very pleasant 59 years old white female with recurrent non-small cell lung cancer, adenocarcinoma with positive PDL 1 expression of 60%. The patient completed treatment with immunotherapy with Ketruda 200 mg IV every 3 weeks status post 35 cycles. The patient is doing fine today with no concerning complaints. She had repeat CT scan of the chest performed recently.  I personally and independently reviewed the scan images and discussed the results with the patient today. Her scan showed a few new small solid pulmonary nodules scattered in both lungs concerning for pulmonary metastasis but inflammatory process could not be completely ruled out at this point. The nodules are below the detection level of the PET scan. I recommended for the patient to have repeat CT scan of the chest in 3 months for restaging  of her disease. I strongly encouraged the patient to quit smoking. She was advised to call immediately if she has any concerning symptoms in the interval. The patient voices understanding of current disease status and treatment options and is in agreement with the current care plan. All questions were answered. The patient knows to call the clinic with any problems, questions or concerns. We can certainly see the patient much sooner if necessary.  Disclaimer: This note was dictated with voice recognition software. Similar sounding words can inadvertently be transcribed and may not be corrected upon review.

## 2019-10-23 NOTE — Patient Instructions (Signed)
Steps to Quit Smoking Smoking tobacco is the leading cause of preventable death. It can affect almost every organ in the body. Smoking puts you and people around you at risk for many serious, long-lasting (chronic) diseases. Quitting smoking can be hard, but it is one of the best things that you can do for your health. It is never too late to quit. How do I get ready to quit? When you decide to quit smoking, make a plan to help you succeed. Before you quit:  Pick a date to quit. Set a date within the next 2 weeks to give you time to prepare.  Write down the reasons why you are quitting. Keep this list in places where you will see it often.  Tell your family, friends, and co-workers that you are quitting. Their support is important.  Talk with your doctor about the choices that may help you quit.  Find out if your health insurance will pay for these treatments.  Know the people, places, things, and activities that make you want to smoke (triggers). Avoid them. What first steps can I take to quit smoking?  Throw away all cigarettes at home, at work, and in your car.  Throw away the things that you use when you smoke, such as ashtrays and lighters.  Clean your car. Make sure to empty the ashtray.  Clean your home, including curtains and carpets. What can I do to help me quit smoking? Talk with your doctor about taking medicines and seeing a counselor at the same time. You are more likely to succeed when you do both.  If you are pregnant or breastfeeding, talk with your doctor about counseling or other ways to quit smoking. Do not take medicine to help you quit smoking unless your doctor tells you to do so. To quit smoking: Quit right away  Quit smoking totally, instead of slowly cutting back on how much you smoke over a period of time.  Go to counseling. You are more likely to quit if you go to counseling sessions regularly. Take medicine You may take medicines to help you quit. Some  medicines need a prescription, and some you can buy over-the-counter. Some medicines may contain a drug called nicotine to replace the nicotine in cigarettes. Medicines may:  Help you to stop having the desire to smoke (cravings).  Help to stop the problems that come when you stop smoking (withdrawal symptoms). Your doctor may ask you to use:  Nicotine patches, gum, or lozenges.  Nicotine inhalers or sprays.  Non-nicotine medicine that is taken by mouth. Find resources Find resources and other ways to help you quit smoking and remain smoke-free after you quit. These resources are most helpful when you use them often. They include:  Online chats with a counselor.  Phone quitlines.  Printed self-help materials.  Support groups or group counseling.  Text messaging programs.  Mobile phone apps. Use apps on your mobile phone or tablet that can help you stick to your quit plan. There are many free apps for mobile phones and tablets as well as websites. Examples include Quit Guide from the CDC and smokefree.gov  What things can I do to make it easier to quit?   Talk to your family and friends. Ask them to support and encourage you.  Call a phone quitline (1-800-QUIT-NOW), reach out to support groups, or work with a counselor.  Ask people who smoke to not smoke around you.  Avoid places that make you want to smoke,   such as: ? Bars. ? Parties. ? Smoke-break areas at work.  Spend time with people who do not smoke.  Lower the stress in your life. Stress can make you want to smoke. Try these things to help your stress: ? Getting regular exercise. ? Doing deep-breathing exercises. ? Doing yoga. ? Meditating. ? Doing a body scan. To do this, close your eyes, focus on one area of your body at a time from head to toe. Notice which parts of your body are tense. Try to relax the muscles in those areas. How will I feel when I quit smoking? Day 1 to 3 weeks Within the first 24 hours,  you may start to have some problems that come from quitting tobacco. These problems are very bad 2-3 days after you quit, but they do not often last for more than 2-3 weeks. You may get these symptoms:  Mood swings.  Feeling restless, nervous, angry, or annoyed.  Trouble concentrating.  Dizziness.  Strong desire for high-sugar foods and nicotine.  Weight gain.  Trouble pooping (constipation).  Feeling like you may vomit (nausea).  Coughing or a sore throat.  Changes in how the medicines that you take for other issues work in your body.  Depression.  Trouble sleeping (insomnia). Week 3 and afterward After the first 2-3 weeks of quitting, you may start to notice more positive results, such as:  Better sense of smell and taste.  Less coughing and sore throat.  Slower heart rate.  Lower blood pressure.  Clearer skin.  Better breathing.  Fewer sick days. Quitting smoking can be hard. Do not give up if you fail the first time. Some people need to try a few times before they succeed. Do your best to stick to your quit plan, and talk with your doctor if you have any questions or concerns. Summary  Smoking tobacco is the leading cause of preventable death. Quitting smoking can be hard, but it is one of the best things that you can do for your health.  When you decide to quit smoking, make a plan to help you succeed.  Quit smoking right away, not slowly over a period of time.  When you start quitting, seek help from your doctor, family, or friends. This information is not intended to replace advice given to you by your health care provider. Make sure you discuss any questions you have with your health care provider. Document Revised: 02/08/2019 Document Reviewed: 08/04/2018 Elsevier Patient Education  2020 Elsevier Inc.  

## 2019-11-01 ENCOUNTER — Telehealth: Payer: Self-pay | Admitting: Internal Medicine

## 2019-11-01 NOTE — Telephone Encounter (Signed)
Scheduled per los. Called and left msg. Mailed printout  °

## 2019-12-17 ENCOUNTER — Ambulatory Visit (HOSPITAL_COMMUNITY)
Admission: RE | Admit: 2019-12-17 | Discharge: 2019-12-17 | Disposition: A | Discharge status: Home / Self Care | Payer: Medicare Other | Source: Ambulatory Visit | Attending: Cardiovascular Disease | Admitting: Cardiovascular Disease

## 2019-12-17 ENCOUNTER — Other Ambulatory Visit: Payer: Self-pay

## 2019-12-17 DIAGNOSIS — I2510 Atherosclerotic heart disease of native coronary artery without angina pectoris: Secondary | ICD-10-CM | POA: Diagnosis present

## 2019-12-17 DIAGNOSIS — I779 Disorder of arteries and arterioles, unspecified: Secondary | ICD-10-CM | POA: Insufficient documentation

## 2020-01-21 ENCOUNTER — Ambulatory Visit (HOSPITAL_COMMUNITY)
Admission: RE | Admit: 2020-01-21 | Discharge: 2020-01-21 | Disposition: A | Discharge status: Home / Self Care | Payer: Medicare Other | Source: Ambulatory Visit | Attending: Internal Medicine | Admitting: Internal Medicine

## 2020-01-21 ENCOUNTER — Inpatient Hospital Stay: Payer: Medicare Other | Attending: Internal Medicine

## 2020-01-21 ENCOUNTER — Encounter (HOSPITAL_COMMUNITY): Payer: Self-pay

## 2020-01-21 ENCOUNTER — Other Ambulatory Visit: Payer: Self-pay

## 2020-01-21 DIAGNOSIS — C3490 Malignant neoplasm of unspecified part of unspecified bronchus or lung: Secondary | ICD-10-CM

## 2020-01-21 DIAGNOSIS — C3432 Malignant neoplasm of lower lobe, left bronchus or lung: Secondary | ICD-10-CM | POA: Insufficient documentation

## 2020-01-21 LAB — CMP (CANCER CENTER ONLY)
ALT: 8 U/L (ref 0–44)
AST: 21 U/L (ref 15–41)
Albumin: 3.6 g/dL (ref 3.5–5.0)
Alkaline Phosphatase: 72 U/L (ref 38–126)
Anion gap: 8 (ref 5–15)
BUN: 10 mg/dL (ref 6–20)
CO2: 25 mmol/L (ref 22–32)
Calcium: 9.6 mg/dL (ref 8.9–10.3)
Chloride: 106 mmol/L (ref 98–111)
Creatinine: 0.89 mg/dL (ref 0.44–1.00)
GFR, Est AFR Am: >60 mL/min (ref >60)
GFR, Estimated: >60 mL/min (ref >60)
Glucose, Bld: 101 mg/dL — ABNORMAL HIGH (ref 70–99)
Potassium: 4.4 mmol/L (ref 3.5–5.1)
Sodium: 139 mmol/L (ref 135–145)
Total Bilirubin: 0.4 mg/dL (ref 0.3–1.2)
Total Protein: 7.2 g/dL (ref 6.5–8.1)

## 2020-01-21 LAB — CBC WITH DIFFERENTIAL (CANCER CENTER ONLY)
Abs Immature Granulocytes: 0.03 10*3/uL (ref 0.00–0.07)
Basophils Absolute: 0.1 10*3/uL (ref 0.0–0.1)
Basophils Relative: 1 %
Eosinophils Absolute: 0.1 10*3/uL (ref 0.0–0.5)
Eosinophils Relative: 1 %
HCT: 46.7 % — ABNORMAL HIGH (ref 36.0–46.0)
Hemoglobin: 15.6 g/dL — ABNORMAL HIGH (ref 12.0–15.0)
Immature Granulocytes: 0 %
Lymphocytes Relative: 23 %
Lymphs Abs: 1.9 10*3/uL (ref 0.7–4.0)
MCH: 33.3 pg (ref 26.0–34.0)
MCHC: 33.4 g/dL (ref 30.0–36.0)
MCV: 99.6 fL (ref 80.0–100.0)
Monocytes Absolute: 0.4 10*3/uL (ref 0.1–1.0)
Monocytes Relative: 5 %
Neutro Abs: 5.8 10*3/uL (ref 1.7–7.7)
Neutrophils Relative %: 70 %
Platelet Count: 244 10*3/uL (ref 150–400)
RBC: 4.69 MIL/uL (ref 3.87–5.11)
RDW: 14.6 % (ref 11.5–15.5)
WBC Count: 8.3 10*3/uL (ref 4.0–10.5)
nRBC: 0 % (ref 0.0–0.2)

## 2020-01-21 MED ORDER — IOHEXOL 300 MG/ML  SOLN
75.0000 mL | Freq: Once | INTRAMUSCULAR | Status: AC | PRN
  Administered 2020-01-21: 75 mL via INTRAVENOUS

## 2020-01-21 MED ORDER — SODIUM CHLORIDE (PF) 0.9 % IJ SOLN
INTRAMUSCULAR | Status: AC
  Filled 2020-01-21: qty 50

## 2020-01-22 ENCOUNTER — Inpatient Hospital Stay (HOSPITAL_BASED_OUTPATIENT_CLINIC_OR_DEPARTMENT_OTHER): Payer: Medicare Other | Admitting: Internal Medicine

## 2020-01-22 ENCOUNTER — Encounter: Payer: Self-pay | Admitting: Internal Medicine

## 2020-01-22 ENCOUNTER — Other Ambulatory Visit: Payer: Self-pay

## 2020-01-22 VITALS — BP 134/77 | HR 66 | Temp 97.4°F | Resp 19 | Ht 64.0 in | Wt 133.1 lb

## 2020-01-22 DIAGNOSIS — C3432 Malignant neoplasm of lower lobe, left bronchus or lung: Secondary | ICD-10-CM

## 2020-01-22 DIAGNOSIS — C3412 Malignant neoplasm of upper lobe, left bronchus or lung: Secondary | ICD-10-CM | POA: Diagnosis not present

## 2020-01-22 DIAGNOSIS — C3490 Malignant neoplasm of unspecified part of unspecified bronchus or lung: Secondary | ICD-10-CM | POA: Diagnosis not present

## 2020-01-22 NOTE — Addendum Note (Signed)
Addended by: Ardeen Garland on: 01/22/2020 10:11 AM   Modules accepted: Orders

## 2020-01-22 NOTE — Progress Notes (Signed)
[facility] [phone]   Fax:([phone]) [place], [place] [address]  DIAGNOSIS: Recurrent non-small cell lung cancer, adenocarcinoma with PDL 1 expression of 60% diagnosed in November 2016. The patient was initially diagnosed as stage IB (T2a, N0, M0) adenocarcinoma in March 2016.  PRIOR THERAPY:   1) Status post left video-assisted thoracoscopy, wedge resection left lower lobe, thoracoscopic left lower lobectomy and mediastinal lymph node dissection on 08/14/2014.  2) Immunotherapy with Ketruda (pembrolizumab) 200 mg IV every 3 weeks status post 35 cycles. First dose was given 05/12/2015.  This was discontinued after the patient completed 2 years of treatment.  CURRENT THERAPY: Observation.   INTERVAL HISTORY: Sharon [name] 59 y.o. female returns to the clinic today for follow-up visit.  The patient is feeling fine today with no concerning complaints.  She denied having any chest pain, shortness of breath, cough or hemoptysis.  She denied having any fever or chills.  She has no nausea, vomiting, diarrhea or constipation.  She denied having any headache or visual changes.  She is currently on observation and feeling well.  She had repeat CT scan of the chest performed recently and she is here for evaluation and discussion of her scan results.   MEDICAL HISTORY: Past Medical History:  Diagnosis Date  . Aortic insufficiency    Echo 8/09:  EF 55-60, impaired relaxation, mild aortic stenosis (mean 8.1), moderate aortic insufficiency, trace MR, trace TR // Echo 4/18: mild LVH, EF 55-60, no RWMA, Gr 1 DD, mod AI // Echo 5/19: EF 36-64, normal diastolic function, mild aortic stenosis (mean 16, peak 30), moderate aortic insufficiency   . Arthritis   . Carotid artery disease ([facility]) 03/09/2011   Carotid US 8/15 - R 40-59; L1-39 >> follow-up 1 year // Carotid US 4/18: ICA 1-39 bilaterally - repeat 1 yr// Carotid  US 5/19: Bilateral ICA 1-39  . COPD (chronic obstructive pulmonary disease) ([facility])   . Coronary artery disease involving native coronary artery of native heart without angina pectoris 11/24/2009   s/p ant STEMI 5/09 >> LHC 5/09 - LM normal; LAD proximal 99; branching DX with one branch occluded and the other severely diseased; LCx okay with irregularities in the OM1; RCA proximal occluded-CTO; EF 30-40, anterior wall HK  >>  PCI: 3 x 18 mm Promus DES, 2.5 x 15 mm Promus DES to the LAD  . Emphysema of lung ([facility])   . Headache 09/06/2016  . History of acute anterior wall MI 12/2007   s/p DES x 2 to LAD  . Hyperlipidemia   . Ischemic cardiomyopathy   . Non-small cell lung cancer ([facility]) dx'd 06/2014   per CT CHEST/PET 2/4 and 07/18/14 @ [facility]  . Pneumonia    2015   HX BRONCHITIS    . Tobacco user     ALLERGIES:  is allergic to codeine, lipitor [atorvastatin calcium], morphine and related, oxycodone-acetaminophen, metoprolol, and zetia [ezetimibe].  MEDICATIONS:  Current Outpatient Medications  Medication Sig Dispense Refill  . acetaminophen (TYLENOL) 500 MG tablet Take 1,000 mg by mouth every 6 (six) hours as needed for moderate pain or headache.    Marland Kitchen aspirin (ASPIR-LOW) 81 MG EC tablet Take 81 mg by mouth daily.     . cholecalciferol (VITAMIN D3) 25 MCG (1000 UT) tablet Take 2,000 Units by mouth daily.    . clopidogrel (PLAVIX) 75 MG tablet Take 1 tablet (75 mg  total) by mouth daily. 90 tablet 3  . ezetimibe (ZETIA) 10 MG tablet TAKE 1 TABLET BY MOUTH DAILY 90 tablet 3  . fluticasone (FLONASE) 50 MCG/ACT nasal spray Place 1 spray into both nostrils daily.    Marland Kitchen levothyroxine (SYNTHROID, LEVOTHROID) 88 MCG tablet Take 88 mcg by mouth daily before breakfast.    . rosuvastatin (CRESTOR) 40 MG tablet Take 1 tablet (40 mg total) by mouth daily. 90 tablet 3   No current facility-administered medications for this visit.    SURGICAL HISTORY:  Past Surgical History:  Procedure Laterality Date  .  CARDIAC CATHETERIZATION  10/15/2007   EF 30-40%  . CESAREAN SECTION    . CHOLECYSTECTOMY    . CORONARY ANGIOPLASTY WITH STENT PLACEMENT     LAD  . ectopic pregnancies requiring laparotomies    . IR KYPHO THORACIC WITH BONE BIOPSY  08/14/2018  . LEAD REMOVAL Left 08/14/2014   Procedure: CRYO INTERCOSTAL NERVE BLOCK;  Surgeon: Melrose Nakayama, MD;  Location: [facility];  Service: Thoracic;  Laterality: Left;  . LOBECTOMY Left 08/14/2014   Procedure: LEFT LOWER LOBECTOMY;  Surgeon: Melrose Nakayama, MD;  Location: [facility];  Service: Thoracic;  Laterality: Left;  . NODE DISSECTION Left 08/14/2014   Procedure: NODE DISSECTION, LEFT LOWER LOBE LUNG;  Surgeon: Melrose Nakayama, MD;  Location: [facility];  Service: Thoracic;  Laterality: Left;  . SHOULDER SURGERY     LEFT X 2   (REMOVED SOME COLLAR BONE )  . TRANSTHORACIC ECHOCARDIOGRAM  10/17/2007  . TUBAL LIGATION    . US ECHOCARDIOGRAPHY  01/07/2008   EF 55-60%  . VIDEO ASSISTED THORACOSCOPY (VATS)/WEDGE RESECTION Left 08/14/2014   Procedure: VIDEO ASSISTED THORACOSCOPY (VATS)/WEDGE RESECTION;  Surgeon: Melrose Nakayama, MD;  Location: [facility];  Service: Thoracic;  Laterality: Left;    REVIEW OF SYSTEMS:  A comprehensive review of systems was negative.   PHYSICAL EXAMINATION: General appearance: alert, cooperative and no distress Head: Normocephalic, without obvious abnormality, atraumatic Neck: no adenopathy, no JVD, supple, symmetrical, trachea midline and thyroid not enlarged, symmetric, no tenderness/mass/nodules Lymph nodes: Cervical, supraclavicular, and axillary nodes normal. Resp: clear to auscultation bilaterally Back: negative Cardio: regular rate and rhythm, S1, S2 normal, no murmur, click, rub or gallop GI: soft, non-tender; bowel sounds normal; no masses,  no organomegaly Extremities: extremities normal, atraumatic, no cyanosis or edema  ECOG PERFORMANCE STATUS: 1 - Symptomatic but completely ambulatory  Blood pressure  134/77, pulse 66, temperature (!) 97.4 F (36.3 C), resp. rate 19, height 5\' 4"  (1.626 m), weight 133 lb 1.6 oz (60.4 kg), SpO2 95 %.  LABORATORY DATA: Lab Results  Component Value Date   WBC 8.3 01/21/2020   HGB 15.6 (H) 01/21/2020   HCT 46.7 (H) 01/21/2020   MCV 99.6 01/21/2020   PLT 244 01/21/2020      Chemistry      Component Value Date/Time   NA 139 01/21/2020 1006   NA 139 05/25/2017 1344   K 4.4 01/21/2020 1006   K 3.5 05/25/2017 1344   CL 106 01/21/2020 1006   CO2 25 01/21/2020 1006   CO2 23 05/25/2017 1344   BUN 10 01/21/2020 1006   BUN 10.0 05/25/2017 1344   CREATININE 0.89 01/21/2020 1006   CREATININE 0.7 05/25/2017 1344      Component Value Date/Time   CALCIUM 9.6 01/21/2020 1006   CALCIUM 9.4 05/25/2017 1344   ALKPHOS 72 01/21/2020 1006   ALKPHOS 94 05/25/2017 1344   AST 21 01/21/2020  1006   AST 23 05/25/2017 1344   ALT 8 01/21/2020 1006   ALT 20 05/25/2017 1344   BILITOT 0.4 01/21/2020 1006   BILITOT 0.38 05/25/2017 1344       RADIOGRAPHIC STUDIES: CT Chest W Contrast  Result Date: 01/21/2020 CLINICAL DATA:  Primary Cancer Type: Lung Imaging Indication: Routine surveillance Interval therapy since last imaging? No Initial Cancer Diagnosis Date: 08/14/2014; Established by: Biopsy-proven Detailed Pathology: Recurrent non-small cell lung cancer. Initially diagnosed as stage IB adenocarcinoma in March 2016. Primary Tumor location: Left lower lobe. Recurrence? Yes; Date(s) of recurrence: 04/30/2015; Established by: Biopsy-proven Surgeries: Left lower lobe wedge resection 08/14/2014. Coronary stents. Chemotherapy: No Immunotherapy?  Yes; Type: Keytruda; Ongoing? No Radiation therapy? No EXAM: CT CHEST WITH CONTRAST TECHNIQUE: Multidetector CT imaging of the chest was performed during intravenous contrast administration. CONTRAST:  36mL OMNIPAQUE IOHEXOL 300 MG/ML  SOLN COMPARISON:  Most recent CT chest 10/16/2019.  04/03/2015 PET-CT. FINDINGS: Cardiovascular: The  heart size is within normal limits. Ascending thoracic aortic aneurysm is unchanged measuring 4 cm, image 66/2. Aortic atherosclerosis. Coronary artery calcifications. No pericardial effusion identified. Mediastinum/Nodes: The thyroid gland is either atrophic or surgically absent. The trachea appears patent and is midline. Normal appearance of the esophagus. The index high right paratracheal lymph node is unchanged measuring 1.1 cm. The index pre-vascular lymph node measures 0.9 cm, image 60/2. No new or enlarging suspicious lymph nodes identified. Lungs/Pleura: No pleural effusion identified. Centrilobular and paraseptal emphysema with diffuse bronchial wall thickening. Postop change from left lower lobectomy. Multiple small, less than 1 cm nodules are again noted. Index nodule within the left upper lobe measures 0.3 cm, image 77/7. Previously 0.6 cm. Index nodule within the posterior right upper lobe measures 0.3 cm, image 59/7. Previously 0.5 cm. Numerous additional small scattered lung nodules are again seen and are either stable or decreased in the interval. No new or enlarging suspicious lung nodules. Upper Abdomen: Previous cholecystectomy. Aortic atherosclerosis noted. No acute abnormality identified within the imaged portions of the upper abdomen. Musculoskeletal: Treated compression deformity at the T12 level is again noted and appears unchanged. No suspicious bone lesions. IMPRESSION: 1. Scattered small bilateral pulmonary nodules are again noted. The largest nodules are in the left upper lobe and posterior right upper lobe and are both decreased in size from the previous exam. Other small scattered lung nodules are either stable or decreased in the interval. 2. Unchanged appearance of prominent mediastinal lymph nodes. 3. Stable mild aneurysmal dilatation of the ascending thoracic aorta. Recommend annual imaging followup by CTA or MRA. This recommendation follows 2010  ACCF/AHA/AATS/ACR/ASA/SCA/SCAI/SIR/STS/SVM Guidelines for the Diagnosis and Management of Patients with Thoracic Aortic Disease. Circulation. 2010; 121: P809-X833. Aortic aneurysm NOS (ICD10-I71.9) 4. Coronary artery calcifications. 5. Aortic Atherosclerosis (ICD10-I70.0) and Emphysema (ICD10-J43.9). Electronically Signed   By: Kerby Moors M.D.   On: 01/21/2020 11:40    ASSESSMENT AND PLAN:  This is a very pleasant 59 years old white female with recurrent non-small cell lung cancer, adenocarcinoma with positive PDL 1 expression of 60%. The patient completed treatment with immunotherapy with Ketruda 200 mg IV every 3 weeks status post 35 cycles. The patient is currently on observation and she is feeling fine today with no concerning complaints. She had repeat CT scan of the chest that showed no concerning findings for disease progression. I recommended for the patient to continue on observation with repeat CT scan of the chest in 4 months. The patient did not receive her Covid vaccine and not  interested in it. She was advised to call immediately if she has any concerning symptoms in the interval. The patient voices understanding of current disease status and treatment options and is in agreement with the current care plan. All questions were answered. The patient knows to call the clinic with any problems, questions or concerns. We can certainly see the patient much sooner if necessary.  Disclaimer: This note was dictated with voice recognition software. Similar sounding words can inadvertently be transcribed and may not be corrected upon review.

## 2020-01-23 ENCOUNTER — Telehealth: Payer: Self-pay | Admitting: Internal Medicine

## 2020-01-23 NOTE — Telephone Encounter (Signed)
Scheduled per los. Called, not able to leave msg. Mailed printout  

## 2020-05-18 ENCOUNTER — Other Ambulatory Visit: Payer: Self-pay

## 2020-05-18 ENCOUNTER — Ambulatory Visit (HOSPITAL_COMMUNITY)
Admission: RE | Admit: 2020-05-18 | Discharge: 2020-05-18 | Disposition: A | Discharge status: Home / Self Care | Payer: Medicare Other | Source: Ambulatory Visit | Attending: Internal Medicine | Admitting: Internal Medicine

## 2020-05-18 ENCOUNTER — Inpatient Hospital Stay: Payer: Medicare Other | Attending: Internal Medicine

## 2020-05-18 DIAGNOSIS — C3432 Malignant neoplasm of lower lobe, left bronchus or lung: Secondary | ICD-10-CM | POA: Insufficient documentation

## 2020-05-18 DIAGNOSIS — C3490 Malignant neoplasm of unspecified part of unspecified bronchus or lung: Secondary | ICD-10-CM

## 2020-05-18 LAB — CBC WITH DIFFERENTIAL (CANCER CENTER ONLY)
Abs Immature Granulocytes: 0.04 10*3/uL (ref 0.00–0.07)
Basophils Absolute: 0.1 10*3/uL (ref 0.0–0.1)
Basophils Relative: 1 %
Eosinophils Absolute: 0.1 10*3/uL (ref 0.0–0.5)
Eosinophils Relative: 1 %
HCT: 46.7 % — ABNORMAL HIGH (ref 36.0–46.0)
Hemoglobin: 16 g/dL — ABNORMAL HIGH (ref 12.0–15.0)
Immature Granulocytes: 0 %
Lymphocytes Relative: 27 %
Lymphs Abs: 2.7 10*3/uL (ref 0.7–4.0)
MCH: 34.3 pg — ABNORMAL HIGH (ref 26.0–34.0)
MCHC: 34.3 g/dL (ref 30.0–36.0)
MCV: 100.2 fL — ABNORMAL HIGH (ref 80.0–100.0)
Monocytes Absolute: 0.7 10*3/uL (ref 0.1–1.0)
Monocytes Relative: 7 %
Neutro Abs: 6.2 10*3/uL (ref 1.7–7.7)
Neutrophils Relative %: 64 %
Platelet Count: 233 10*3/uL (ref 150–400)
RBC: 4.66 MIL/uL (ref 3.87–5.11)
RDW: 13.5 % (ref 11.5–15.5)
WBC Count: 9.7 10*3/uL (ref 4.0–10.5)
nRBC: 0 % (ref 0.0–0.2)

## 2020-05-18 LAB — CMP (CANCER CENTER ONLY)
ALT: 36 U/L (ref 0–44)
AST: 36 U/L (ref 15–41)
Albumin: 3.7 g/dL (ref 3.5–5.0)
Alkaline Phosphatase: 69 U/L (ref 38–126)
Anion gap: 7 (ref 5–15)
BUN: 12 mg/dL (ref 6–20)
CO2: 28 mmol/L (ref 22–32)
Calcium: 9.7 mg/dL (ref 8.9–10.3)
Chloride: 105 mmol/L (ref 98–111)
Creatinine: 0.83 mg/dL (ref 0.44–1.00)
GFR, Estimated: >60 mL/min (ref >60)
Glucose, Bld: 100 mg/dL — ABNORMAL HIGH (ref 70–99)
Potassium: 4.1 mmol/L (ref 3.5–5.1)
Sodium: 140 mmol/L (ref 135–145)
Total Bilirubin: 0.5 mg/dL (ref 0.3–1.2)
Total Protein: 7.3 g/dL (ref 6.5–8.1)

## 2020-05-18 MED ORDER — IOHEXOL 300 MG/ML  SOLN
75.0000 mL | Freq: Once | INTRAMUSCULAR | Status: AC | PRN
  Administered 2020-05-18: 75 mL via INTRAVENOUS

## 2020-05-20 ENCOUNTER — Telehealth: Payer: Self-pay | Admitting: Internal Medicine

## 2020-05-20 ENCOUNTER — Inpatient Hospital Stay (HOSPITAL_BASED_OUTPATIENT_CLINIC_OR_DEPARTMENT_OTHER): Payer: Medicare Other | Admitting: Internal Medicine

## 2020-05-20 ENCOUNTER — Other Ambulatory Visit: Payer: Self-pay

## 2020-05-20 ENCOUNTER — Encounter: Payer: Self-pay | Admitting: Internal Medicine

## 2020-05-20 VITALS — BP 122/69 | HR 78 | Temp 97.8°F | Resp 20 | Ht 64.0 in | Wt 132.3 lb

## 2020-05-20 DIAGNOSIS — E039 Hypothyroidism, unspecified: Secondary | ICD-10-CM

## 2020-05-20 DIAGNOSIS — C3432 Malignant neoplasm of lower lobe, left bronchus or lung: Secondary | ICD-10-CM | POA: Diagnosis not present

## 2020-05-20 DIAGNOSIS — C3412 Malignant neoplasm of upper lobe, left bronchus or lung: Secondary | ICD-10-CM | POA: Diagnosis not present

## 2020-05-20 DIAGNOSIS — C3490 Malignant neoplasm of unspecified part of unspecified bronchus or lung: Secondary | ICD-10-CM | POA: Diagnosis not present

## 2020-05-20 NOTE — Progress Notes (Signed)
[facility] Telephone:(336) [phone]   Fax:(336) [place], NP [place] [address]  DIAGNOSIS: Recurrent non-small cell lung cancer, adenocarcinoma with PDL 1 expression of 60% diagnosed in November 2016. The patient was initially diagnosed as stage IB (T2a, N0, M0) adenocarcinoma in March 2016.  PRIOR THERAPY:   1) Status post left video-assisted thoracoscopy, wedge resection left lower lobe, thoracoscopic left lower lobectomy and mediastinal lymph node dissection on 08/14/2014.  2) Immunotherapy with Ketruda (pembrolizumab) 200 mg IV every 3 weeks status post 35 cycles. First dose was given 05/12/2015.  This was discontinued after the patient completed 2 years of treatment.  CURRENT THERAPY: Observation.   INTERVAL HISTORY: Sharon [name] 59 y.o. female returns to the clinic today for 4 months follow-up visit.  The patient is feeling fine today with no concerning complaints.  She denied having any chest pain, shortness of breath, cough or hemoptysis.  She denied having any fever or chills.  She has no nausea, vomiting, diarrhea or constipation.  She denied having any headache or visual changes.  She is currently on observation.  She had repeat CT scan of the chest performed recently and she is here for evaluation and discussion of her scan results.  MEDICAL HISTORY: Past Medical History:  Diagnosis Date  . Aortic insufficiency    Echo 8/09:  EF 55-60, impaired relaxation, mild aortic stenosis (mean 8.1), moderate aortic insufficiency, trace MR, trace TR // Echo 4/18: mild LVH, EF 55-60, no RWMA, Gr 1 DD, mod AI // Echo 5/19: EF 79-89, normal diastolic function, mild aortic stenosis (mean 16, peak 30), moderate aortic insufficiency   . Arthritis   . Carotid artery disease ([facility]) 03/09/2011   Carotid US 8/15 - R 40-59; L1-39 >> follow-up 1 year // Carotid US 4/18: ICA 1-39 bilaterally - repeat 1 yr// Carotid US 5/19:  Bilateral ICA 1-39  . COPD (chronic obstructive pulmonary disease) ([facility])   . Coronary artery disease involving native coronary artery of native heart without angina pectoris 11/24/2009   s/p ant STEMI 5/09 >> LHC 5/09 - LM normal; LAD proximal 99; branching DX with one branch occluded and the other severely diseased; LCx okay with irregularities in the OM1; RCA proximal occluded-CTO; EF 30-40, anterior wall HK  >>  PCI: 3 x 18 mm Promus DES, 2.5 x 15 mm Promus DES to the LAD  . Emphysema of lung ([facility])   . Headache 09/06/2016  . History of acute anterior wall MI 12/2007   s/p DES x 2 to LAD  . Hyperlipidemia   . Ischemic cardiomyopathy   . Non-small cell lung cancer ([facility]) dx'd 06/2014   per CT CHEST/PET 2/4 and 07/18/14 @ [facility]  . Pneumonia    2015   HX BRONCHITIS    . Tobacco user     ALLERGIES:  is allergic to codeine, lipitor [atorvastatin calcium], morphine and related, oxycodone-acetaminophen, metoprolol, and zetia [ezetimibe].  MEDICATIONS:  Current Outpatient Medications  Medication Sig Dispense Refill  . acetaminophen (TYLENOL) 500 MG tablet Take 1,000 mg by mouth every 6 (six) hours as needed for moderate pain or headache.    . Albuterol Sulfate (PROAIR RESPICLICK) 211 (90 Base) MCG/ACT AEPB Inhale into the lungs.    Marland Kitchen aspirin (ASPIR-LOW) 81 MG EC tablet Take 81 mg by mouth daily.     . cholecalciferol (VITAMIN D3) 25 MCG (1000 UT) tablet Take 2,000 Units by mouth  daily.    . clopidogrel (PLAVIX) 75 MG tablet Take 1 tablet (75 mg total) by mouth daily. 90 tablet 3  . ezetimibe (ZETIA) 10 MG tablet TAKE 1 TABLET BY MOUTH DAILY 90 tablet 3  . Fluticasone-Umeclidin-Vilant (TRELEGY ELLIPTA IN) Inhale into the lungs.    Marland Kitchen levothyroxine (SYNTHROID, LEVOTHROID) 88 MCG tablet Take 88 mcg by mouth daily before breakfast.    . risedronate (ACTONEL) 35 MG tablet Take 35 mg by mouth once a week.    . rosuvastatin (CRESTOR) 40 MG tablet Take 1 tablet (40 mg total) by mouth daily. 90 tablet 3    No current facility-administered medications for this visit.    SURGICAL HISTORY:  Past Surgical History:  Procedure Laterality Date  . CARDIAC CATHETERIZATION  10/15/2007   EF 30-40%  . CESAREAN SECTION    . CHOLECYSTECTOMY    . CORONARY ANGIOPLASTY WITH STENT PLACEMENT     LAD  . ectopic pregnancies requiring laparotomies    . IR KYPHO THORACIC WITH BONE BIOPSY  08/14/2018  . LEAD REMOVAL Left 08/14/2014   Procedure: CRYO INTERCOSTAL NERVE BLOCK;  Surgeon: Melrose Nakayama, MD;  Location: [facility];  Service: Thoracic;  Laterality: Left;  . LOBECTOMY Left 08/14/2014   Procedure: LEFT LOWER LOBECTOMY;  Surgeon: Melrose Nakayama, MD;  Location: [facility];  Service: Thoracic;  Laterality: Left;  . NODE DISSECTION Left 08/14/2014   Procedure: NODE DISSECTION, LEFT LOWER LOBE LUNG;  Surgeon: Melrose Nakayama, MD;  Location: [facility];  Service: Thoracic;  Laterality: Left;  . SHOULDER SURGERY     LEFT X 2   (REMOVED SOME COLLAR BONE )  . TRANSTHORACIC ECHOCARDIOGRAM  10/17/2007  . TUBAL LIGATION    . US ECHOCARDIOGRAPHY  01/07/2008   EF 55-60%  . VIDEO ASSISTED THORACOSCOPY (VATS)/WEDGE RESECTION Left 08/14/2014   Procedure: VIDEO ASSISTED THORACOSCOPY (VATS)/WEDGE RESECTION;  Surgeon: Melrose Nakayama, MD;  Location: [facility];  Service: Thoracic;  Laterality: Left;    REVIEW OF SYSTEMS:  A comprehensive review of systems was negative.   PHYSICAL EXAMINATION: General appearance: alert, cooperative and no distress Head: Normocephalic, without obvious abnormality, atraumatic Neck: no adenopathy, no JVD, supple, symmetrical, trachea midline and thyroid not enlarged, symmetric, no tenderness/mass/nodules Lymph nodes: Cervical, supraclavicular, and axillary nodes normal. Resp: clear to auscultation bilaterally Back: negative Cardio: regular rate and rhythm, S1, S2 normal, no murmur, click, rub or gallop GI: soft, non-tender; bowel sounds normal; no masses,  no organomegaly Extremities:  extremities normal, atraumatic, no cyanosis or edema  ECOG PERFORMANCE STATUS: 1 - Symptomatic but completely ambulatory  Blood pressure 122/69, pulse 78, temperature 97.8 F (36.6 C), temperature source Tympanic, resp. rate 20, height 5\' 4"  (1.626 m), weight 132 lb 4.8 oz (60 kg), SpO2 100 %.  LABORATORY DATA: Lab Results  Component Value Date   WBC 9.7 05/18/2020   HGB 16.0 (H) 05/18/2020   HCT 46.7 (H) 05/18/2020   MCV 100.2 (H) 05/18/2020   PLT 233 05/18/2020      Chemistry      Component Value Date/Time   NA 140 05/18/2020 1110   NA 139 05/25/2017 1344   K 4.1 05/18/2020 1110   K 3.5 05/25/2017 1344   CL 105 05/18/2020 1110   CO2 28 05/18/2020 1110   CO2 23 05/25/2017 1344   BUN 12 05/18/2020 1110   BUN 10.0 05/25/2017 1344   CREATININE 0.83 05/18/2020 1110   CREATININE 0.7 05/25/2017 1344      Component Value  Date/Time   CALCIUM 9.7 05/18/2020 1110   CALCIUM 9.4 05/25/2017 1344   ALKPHOS 69 05/18/2020 1110   ALKPHOS 94 05/25/2017 1344   AST 36 05/18/2020 1110   AST 23 05/25/2017 1344   ALT 36 05/18/2020 1110   ALT 20 05/25/2017 1344   BILITOT 0.5 05/18/2020 1110   BILITOT 0.38 05/25/2017 1344       RADIOGRAPHIC STUDIES: CT Chest W Contrast  Result Date: 05/18/2020 CLINICAL DATA:  Recurrent non-small cell lung cancer. Adenocarcinoma. Originally diagnosed in 2016. Left lower lobe primary. Recurrence in December of 2016. Left lower lobe wedge resection. EXAM: CT CHEST WITH CONTRAST TECHNIQUE: Multidetector CT imaging of the chest was performed during intravenous contrast administration. CONTRAST:  32mL OMNIPAQUE IOHEXOL 300 MG/ML  SOLN COMPARISON:  01/21/2020 FINDINGS: Cardiovascular: Aortic and branch vessel atherosclerosis. Upper normal ascending aortic size, including at 3.8 cm on 59/2. Tortuous thoracic aorta. Normal heart size, without pericardial effusion. Multivessel coronary artery atherosclerosis. No central pulmonary embolism, on this non-dedicated  study. Mediastinum/Nodes: No supraclavicular adenopathy. A right paratracheal node measures 1.0 cm on 36/2 versus 1.1 cm on the prior. No hilar adenopathy. A prevascular node measures 8 mm on 56/2 versus 9 mm on the prior. Lungs/Pleura: No pleural fluid.  Moderate bullous type emphysema. Left lower lobe soft tissue thickening including on 103/7 which is not significantly changed, most likely postoperative. Bilateral pulmonary nodules are primarily similar and slightly decreased; identified on series 7. Example at 2-3 mm in the right upper lobe on 24/7 versus 4 mm on the prior exam. Left upper lobe pulmonary nodules measure 2-3 mm x 2 on 38/7. Slightly more distinct and minimally larger on the prior exam. Some nodules have resolved in the interval. No enlarging or dominant nodule identified. Upper Abdomen: Cholecystectomy. Normal imaged portions of the spleen, pancreas, adrenal glands, kidneys. Gastric underdistention. Musculoskeletal: Mild osteopenia. T12 vertebral augmentation secondary to a moderate compression deformity. Mild T10 superior endplate fracture versus Schmorl's node deformity, similar. IMPRESSION: 1. Status post left lower lobe wedge resection. 2. Primarily decreased and resolved pulmonary nodules, nonspecific. 3. Similar to minimal decrease in thoracic adenopathy. 4. No new or progressive disease. 5. Aortic atherosclerosis (ICD10-I70.0), coronary artery atherosclerosis and emphysema (ICD10-J43.9). Electronically Signed   By: Abigail Miyamoto M.D.   On: 05/18/2020 15:09    ASSESSMENT AND PLAN:  This is a very pleasant 59 years old white female with recurrent non-small cell lung cancer, adenocarcinoma with positive PDL 1 expression of 60%. The patient completed treatment with immunotherapy with Ketruda 200 mg IV every 3 weeks status post 35 cycles. She is currently on observation and she is doing fine with no concerning complaints. She had repeat CT scan of the chest performed recently.  I  personally and independently reviewed the scans and discussed the results with the patient today. Her scan showed no concerning findings for disease progression. I recommended for the patient to continue on observation with repeat CT scan of the chest in 6 months. She was advised to call immediately if she has any concerning symptoms in the interval. The patient voices understanding of current disease status and treatment options and is in agreement with the current care plan. All questions were answered. The patient knows to call the clinic with any problems, questions or concerns. We can certainly see the patient much sooner if necessary.  Disclaimer: This note was dictated with voice recognition software. Similar sounding words can inadvertently be transcribed and may not be corrected upon review.

## 2020-05-20 NOTE — Telephone Encounter (Signed)
Scheduled appointments per 12/22 los. Spoke to patient who is aware of appointments dates and times.

## 2020-11-16 ENCOUNTER — Encounter (HOSPITAL_COMMUNITY): Payer: Self-pay

## 2020-11-16 ENCOUNTER — Ambulatory Visit (HOSPITAL_COMMUNITY)
Admission: RE | Admit: 2020-11-16 | Discharge: 2020-11-16 | Disposition: A | Discharge status: Home / Self Care | Payer: Medicare Other | Source: Ambulatory Visit | Attending: Internal Medicine | Admitting: Internal Medicine

## 2020-11-16 ENCOUNTER — Other Ambulatory Visit: Payer: Self-pay

## 2020-11-16 ENCOUNTER — Inpatient Hospital Stay: Payer: Medicare Other | Attending: Internal Medicine

## 2020-11-16 DIAGNOSIS — C3490 Malignant neoplasm of unspecified part of unspecified bronchus or lung: Secondary | ICD-10-CM | POA: Insufficient documentation

## 2020-11-16 DIAGNOSIS — E039 Hypothyroidism, unspecified: Secondary | ICD-10-CM | POA: Diagnosis not present

## 2020-11-16 DIAGNOSIS — C3432 Malignant neoplasm of lower lobe, left bronchus or lung: Secondary | ICD-10-CM | POA: Insufficient documentation

## 2020-11-16 LAB — CMP (CANCER CENTER ONLY)
ALT: 16 U/L (ref 0–44)
AST: 27 U/L (ref 15–41)
Albumin: 4.2 g/dL (ref 3.5–5.0)
Alkaline Phosphatase: 70 U/L (ref 38–126)
Anion gap: 7 (ref 5–15)
BUN: 18 mg/dL (ref 6–20)
CO2: 25 mmol/L (ref 22–32)
Calcium: 9.2 mg/dL (ref 8.9–10.3)
Chloride: 106 mmol/L (ref 98–111)
Creatinine: 0.89 mg/dL (ref 0.44–1.00)
GFR, Estimated: >60 mL/min (ref >60)
Glucose, Bld: 95 mg/dL (ref 70–99)
Potassium: 4.3 mmol/L (ref 3.5–5.1)
Sodium: 138 mmol/L (ref 135–145)
Total Bilirubin: 0.7 mg/dL (ref 0.3–1.2)
Total Protein: 7.6 g/dL (ref 6.5–8.1)

## 2020-11-16 LAB — CBC WITH DIFFERENTIAL (CANCER CENTER ONLY)
Abs Immature Granulocytes: 0.04 10*3/uL (ref 0.00–0.07)
Basophils Absolute: 0.1 10*3/uL (ref 0.0–0.1)
Basophils Relative: 1 %
Eosinophils Absolute: 0.3 10*3/uL (ref 0.0–0.5)
Eosinophils Relative: 3 %
HCT: 45.3 % (ref 36.0–46.0)
Hemoglobin: 15.7 g/dL — ABNORMAL HIGH (ref 12.0–15.0)
Immature Granulocytes: 0 %
Lymphocytes Relative: 30 %
Lymphs Abs: 2.8 10*3/uL (ref 0.7–4.0)
MCH: 33.9 pg (ref 26.0–34.0)
MCHC: 34.7 g/dL (ref 30.0–36.0)
MCV: 97.8 fL (ref 80.0–100.0)
Monocytes Absolute: 0.7 10*3/uL (ref 0.1–1.0)
Monocytes Relative: 8 %
Neutro Abs: 5.4 10*3/uL (ref 1.7–7.7)
Neutrophils Relative %: 58 %
Platelet Count: 187 10*3/uL (ref 150–400)
RBC: 4.63 MIL/uL (ref 3.87–5.11)
RDW: 14.1 % (ref 11.5–15.5)
WBC Count: 9.3 10*3/uL (ref 4.0–10.5)
nRBC: 0 % (ref 0.0–0.2)

## 2020-11-16 LAB — TSH: TSH: 9.285 u[IU]/mL — ABNORMAL HIGH (ref 0.350–4.500)

## 2020-11-16 MED ORDER — IOHEXOL 300 MG/ML  SOLN
75.0000 mL | Freq: Once | INTRAMUSCULAR | Status: AC | PRN
  Administered 2020-11-16: 75 mL via INTRAVENOUS

## 2020-11-16 MED ORDER — SODIUM CHLORIDE (PF) 0.9 % IJ SOLN
INTRAMUSCULAR | Status: AC
  Filled 2020-11-16: qty 50

## 2020-11-17 ENCOUNTER — Inpatient Hospital Stay (HOSPITAL_BASED_OUTPATIENT_CLINIC_OR_DEPARTMENT_OTHER): Payer: Medicare Other | Admitting: Internal Medicine

## 2020-11-17 VITALS — BP 140/59 | HR 70 | Temp 97.0°F | Resp 18 | Wt 144.8 lb

## 2020-11-17 DIAGNOSIS — C3490 Malignant neoplasm of unspecified part of unspecified bronchus or lung: Secondary | ICD-10-CM | POA: Diagnosis not present

## 2020-11-17 DIAGNOSIS — C3432 Malignant neoplasm of lower lobe, left bronchus or lung: Secondary | ICD-10-CM | POA: Diagnosis not present

## 2020-11-17 DIAGNOSIS — E039 Hypothyroidism, unspecified: Secondary | ICD-10-CM | POA: Diagnosis not present

## 2020-11-17 DIAGNOSIS — C3412 Malignant neoplasm of upper lobe, left bronchus or lung: Secondary | ICD-10-CM | POA: Diagnosis not present

## 2020-11-17 NOTE — Progress Notes (Signed)
[facility] Telephone:([phone]   Fax:(336) [place], [place] [address]  DIAGNOSIS: Recurrent non-small cell lung cancer, adenocarcinoma with PDL 1 expression of 60% diagnosed in November 2016. The patient was initially diagnosed as stage IB (T2a, N0, M0) adenocarcinoma in March 2016.  PRIOR THERAPY:   1) Status post left video-assisted thoracoscopy, wedge resection left lower lobe, thoracoscopic left lower lobectomy and mediastinal lymph node dissection on 08/14/2014.  2) Immunotherapy with Ketruda (pembrolizumab) 200 mg IV every 3 weeks status post 35 cycles. First dose was given 05/12/2015.  This was discontinued after the patient completed 2 years of treatment.  CURRENT THERAPY: Observation.   INTERVAL HISTORY: Sharon [name] 60 y.o. female returns to the clinic today for follow-up visit.  The patient is feeling fine today with no concerning complaints.  She denied having any chest pain, shortness of breath, cough or hemoptysis.  She denied having any recent weight loss or night sweats.  She has no nausea, vomiting, diarrhea or constipation.  She denied having any headache or visual changes.  The patient had repeat CT scan of the chest performed recently and she is here for evaluation and discussion of her scan results.  MEDICAL HISTORY: Past Medical History:  Diagnosis Date   Aortic insufficiency    Echo 8/09:  EF 55-60, impaired relaxation, mild aortic stenosis (mean 8.1), moderate aortic insufficiency, trace MR, trace TR // Echo 4/18: mild LVH, EF 55-60, no RWMA, Gr 1 DD, mod AI // Echo 5/19: EF 24-23, normal diastolic function, mild aortic stenosis (mean 16, peak 30), moderate aortic insufficiency    Arthritis    Carotid artery disease ([facility]) 03/09/2011   Carotid US 8/15 - R 40-59; L1-39 >> follow-up 1 year // Carotid US 4/18: ICA 1-39 bilaterally - repeat 1 yr// Carotid US 5/19: Bilateral ICA 1-39    COPD (chronic obstructive pulmonary disease) ([facility])    Coronary artery disease involving native coronary artery of native heart without angina pectoris 11/24/2009   s/p ant STEMI 5/09 >> LHC 5/09 - LM normal; LAD proximal 99; branching DX with one branch occluded and the other severely diseased; LCx okay with irregularities in the OM1; RCA proximal occluded-CTO; EF 30-40, anterior wall HK  >>  PCI: 3 x 18 mm Promus DES, 2.5 x 15 mm Promus DES to the LAD   Emphysema of lung ([facility])    Headache 09/06/2016   History of acute anterior wall MI 12/2007   s/p DES x 2 to LAD   Hyperlipidemia    Ischemic cardiomyopathy    Non-small cell lung cancer ([facility]) dx'd 06/2014   per CT CHEST/PET 2/4 and 07/18/14 @ [facility]   Pneumonia    2015   HX BRONCHITIS     Tobacco user     ALLERGIES:  is allergic to codeine, lipitor [atorvastatin calcium], morphine and related, oxycodone-acetaminophen, metoprolol, and zetia [ezetimibe].  MEDICATIONS:  Current Outpatient Medications  Medication Sig Dispense Refill   acetaminophen (TYLENOL) 500 MG tablet Take 1,000 mg by mouth every 6 (six) hours as needed for moderate pain or headache.     Albuterol Sulfate (PROAIR RESPICLICK) 536 (90 Base) MCG/ACT AEPB Inhale into the lungs.     aspirin (ASPIR-LOW) 81 MG EC tablet Take 81 mg by mouth daily.      cholecalciferol (VITAMIN D3) 25 MCG (1000 UT) tablet Take 2,000 Units by mouth daily.  clopidogrel (PLAVIX) 75 MG tablet Take 1 tablet (75 mg total) by mouth daily. 90 tablet 3   ezetimibe (ZETIA) 10 MG tablet TAKE 1 TABLET BY MOUTH DAILY 90 tablet 3   Fluticasone-Umeclidin-Vilant (TRELEGY ELLIPTA IN) Inhale into the lungs.     levothyroxine (SYNTHROID, LEVOTHROID) 88 MCG tablet Take 88 mcg by mouth daily before breakfast.     risedronate (ACTONEL) 35 MG tablet Take 35 mg by mouth once a week.     rosuvastatin (CRESTOR) 40 MG tablet Take 1 tablet (40 mg total) by mouth daily. 90 tablet 3   No current facility-administered  medications for this visit.    SURGICAL HISTORY:  Past Surgical History:  Procedure Laterality Date   CARDIAC CATHETERIZATION  10/15/2007   EF 30-40%   CESAREAN SECTION     CHOLECYSTECTOMY     CORONARY ANGIOPLASTY WITH STENT PLACEMENT     LAD   ectopic pregnancies requiring laparotomies     IR KYPHO THORACIC WITH BONE BIOPSY  08/14/2018   LEAD REMOVAL Left 08/14/2014   Procedure: CRYO INTERCOSTAL NERVE BLOCK;  Surgeon: Melrose Nakayama, MD;  Location: [facility];  Service: Thoracic;  Laterality: Left;   LOBECTOMY Left 08/14/2014   Procedure: LEFT LOWER LOBECTOMY;  Surgeon: Melrose Nakayama, MD;  Location: [facility];  Service: Thoracic;  Laterality: Left;   NODE DISSECTION Left 08/14/2014   Procedure: NODE DISSECTION, LEFT LOWER LOBE LUNG;  Surgeon: Melrose Nakayama, MD;  Location: [facility];  Service: Thoracic;  Laterality: Left;   SHOULDER SURGERY     LEFT X 2   (REMOVED SOME COLLAR BONE )   TRANSTHORACIC ECHOCARDIOGRAM  10/17/2007   TUBAL LIGATION     US ECHOCARDIOGRAPHY  01/07/2008   EF 55-60%   VIDEO ASSISTED THORACOSCOPY (VATS)/WEDGE RESECTION Left 08/14/2014   Procedure: VIDEO ASSISTED THORACOSCOPY (VATS)/WEDGE RESECTION;  Surgeon: Melrose Nakayama, MD;  Location[facility];  Service: Thoracic;  Laterality: Left;    REVIEW OF SYSTEMS:  A comprehensive review of systems was negative.   PHYSICAL EXAMINATION: General appearance: alert, cooperative, and no distress Head: Normocephalic, without obvious abnormality, atraumatic Neck: no adenopathy, no JVD, supple, symmetrical, trachea midline, and thyroid not enlarged, symmetric, no tenderness/mass/nodules Lymph nodes: Cervical, supraclavicular, and axillary nodes normal. Resp: clear to auscultation bilaterally Back: negative Cardio: regular rate and rhythm, S1, S2 normal, no murmur, click, rub or gallop GI: soft, non-tender; bowel sounds normal; no masses,  no organomegaly Extremities: extremities normal, atraumatic, no cyanosis or  edema  ECOG PERFORMANCE STATUS: 1 - Symptomatic but completely ambulatory  Blood pressure (!) 140/59, pulse 70, temperature (!) 97 F (36.1 C), resp. rate 18, weight 144 lb 12.8 oz (65.7 kg), SpO2 98 %.  LABORATORY DATA: Lab Results  Component Value Date   WBC 9.3 11/16/2020   HGB 15.7 (H) 11/16/2020   HCT 45.3 11/16/2020   MCV 97.8 11/16/2020   PLT 187 11/16/2020      Chemistry      Component Value Date/Time   NA 138 11/16/2020 0748   NA 139 05/25/2017 1344   K 4.3 11/16/2020 0748   K 3.5 05/25/2017 1344   CL 106 11/16/2020 0748   CO2 25 11/16/2020 0748   CO2 23 05/25/2017 1344   BUN 18 11/16/2020 0748   BUN 10.0 05/25/2017 1344   CREATININE 0.89 11/16/2020 0748   CREATININE 0.7 05/25/2017 1344      Component Value Date/Time   CALCIUM 9.2 11/16/2020 0748   CALCIUM 9.4 05/25/2017 1344  ALKPHOS 70 11/16/2020 0748   ALKPHOS 94 05/25/2017 1344   AST 27 11/16/2020 0748   AST 23 05/25/2017 1344   ALT 16 11/16/2020 0748   ALT 20 05/25/2017 1344   BILITOT 0.7 11/16/2020 0748   BILITOT 0.38 05/25/2017 1344       RADIOGRAPHIC STUDIES: CT Chest W Contrast  Result Date: 11/16/2020 CLINICAL DATA:  Non-small-cell lung cancer.  Restaging. EXAM: CT CHEST WITH CONTRAST TECHNIQUE: Multidetector CT imaging of the chest was performed during intravenous contrast administration. CONTRAST:  67mL OMNIPAQUE IOHEXOL 300 MG/ML  SOLN COMPARISON:  05/18/2020 FINDINGS: Cardiovascular: The heart size is normal. No substantial pericardial effusion. Coronary artery calcification is evident. Atherosclerotic calcification is noted in the wall of the thoracic aorta. Ascending thoracic aorta measures 4 cm diameter. Mediastinum/Nodes: 10 mm short axis right paratracheal node measured previously is stable. 8 mm short axis prevascular node measured previously is not substantially changed in the interval. No mediastinal lymphadenopathy on today's study. No evidence for gross hilar lymphadenopathy although  assessment is limited by the lack of intravenous contrast on today's study. The esophagus has normal imaging features. There is no axillary lymphadenopathy. Lungs/Pleura: Centrilobular and paraseptal emphysema evident. Status post left lower lobectomy. Several scattered tiny bilateral pulmonary nodules are stable in the interval. No new suspicious pulmonary nodule or mass. Stable scarring posterior left costophrenic sulcus. No focal airspace consolidation. No pleural effusion. Upper Abdomen: Unremarkable. Musculoskeletal: No worrisome lytic or sclerotic osseous abnormality. Lower thoracic vertebral augmentation again noted. IMPRESSION: 1. Stable exam. No new or progressive findings to suggest recurrent or metastatic disease. 2. Stable upper normal to borderline mediastinal lymphadenopathy. 3. Status post left lower lobectomy. 4. Aortic Atherosclerosis (ICD10-I70.0) and Emphysema (ICD10-J43.9). Electronically Signed   By: Misty Stanley M.D.   On: 11/16/2020 13:08     ASSESSMENT AND PLAN:  This is a very pleasant 60 years old white female with recurrent non-small cell lung cancer, adenocarcinoma with positive PDL 1 expression of 60%. The patient completed treatment with immunotherapy with Ketruda 200 mg IV every 3 weeks status post 35 cycles. She had repeat CT scan of the chest performed recently.  I personally and independently reviewed the scans and discussed the results with the patient today. Her scan showed no evidence for disease recurrence or metastasis. I recommended for her to continue on observation with repeat CT scan of the chest in 6 months. For the hypothyroidism, she will continue with her levothyroxine and her dose need to be adjusted by her primary care physician.  I gave the patient a copy of her blood work. She was advised to call immediately if she has any other concerning symptoms in the interval The patient voices understanding of current disease status and treatment options and is in  agreement with the current care plan. All questions were answered. The patient knows to call the clinic with any problems, questions or concerns. We can certainly see the patient much sooner if necessary.  Disclaimer: This note was dictated with voice recognition software. Similar sounding words can inadvertently be transcribed and may not be corrected upon review.

## 2020-11-25 ENCOUNTER — Telehealth: Payer: Self-pay | Admitting: Internal Medicine

## 2020-11-25 NOTE — Telephone Encounter (Signed)
Sch per 06/29. [T is aware.

## 2021-05-14 ENCOUNTER — Ambulatory Visit (HOSPITAL_COMMUNITY)
Admission: RE | Admit: 2021-05-14 | Discharge: 2021-05-14 | Disposition: A | Discharge status: Home / Self Care | Payer: Medicare Other | Source: Ambulatory Visit | Attending: Internal Medicine | Admitting: Internal Medicine

## 2021-05-14 ENCOUNTER — Inpatient Hospital Stay: Payer: Medicare Other | Attending: Internal Medicine

## 2021-05-14 ENCOUNTER — Other Ambulatory Visit: Payer: Self-pay

## 2021-05-14 DIAGNOSIS — C3490 Malignant neoplasm of unspecified part of unspecified bronchus or lung: Secondary | ICD-10-CM | POA: Insufficient documentation

## 2021-05-14 DIAGNOSIS — E039 Hypothyroidism, unspecified: Secondary | ICD-10-CM | POA: Insufficient documentation

## 2021-05-14 DIAGNOSIS — C3432 Malignant neoplasm of lower lobe, left bronchus or lung: Secondary | ICD-10-CM | POA: Insufficient documentation

## 2021-05-14 LAB — CMP (CANCER CENTER ONLY)
ALT: 18 U/L (ref 0–44)
AST: 26 U/L (ref 15–41)
Albumin: 3.7 g/dL (ref 3.5–5.0)
Alkaline Phosphatase: 80 U/L (ref 38–126)
Anion gap: 10 (ref 5–15)
BUN: 11 mg/dL (ref 6–20)
CO2: 23 mmol/L (ref 22–32)
Calcium: 8.9 mg/dL (ref 8.9–10.3)
Chloride: 107 mmol/L (ref 98–111)
Creatinine: 0.89 mg/dL (ref 0.44–1.00)
GFR, Estimated: >60 mL/min (ref >60)
Glucose, Bld: 91 mg/dL (ref 70–99)
Potassium: 3.9 mmol/L (ref 3.5–5.1)
Sodium: 140 mmol/L (ref 135–145)
Total Bilirubin: 0.5 mg/dL (ref 0.3–1.2)
Total Protein: 7.3 g/dL (ref 6.5–8.1)

## 2021-05-14 LAB — CBC WITH DIFFERENTIAL (CANCER CENTER ONLY)
Abs Immature Granulocytes: 0.02 10*3/uL (ref 0.00–0.07)
Basophils Absolute: 0 10*3/uL (ref 0.0–0.1)
Basophils Relative: 1 %
Eosinophils Absolute: 0.2 10*3/uL (ref 0.0–0.5)
Eosinophils Relative: 2 %
HCT: 44.3 % (ref 36.0–46.0)
Hemoglobin: 14.9 g/dL (ref 12.0–15.0)
Immature Granulocytes: 0 %
Lymphocytes Relative: 21 %
Lymphs Abs: 1.4 10*3/uL (ref 0.7–4.0)
MCH: 33.4 pg (ref 26.0–34.0)
MCHC: 33.6 g/dL (ref 30.0–36.0)
MCV: 99.3 fL (ref 80.0–100.0)
Monocytes Absolute: 0.6 10*3/uL (ref 0.1–1.0)
Monocytes Relative: 9 %
Neutro Abs: 4.4 10*3/uL (ref 1.7–7.7)
Neutrophils Relative %: 67 %
Platelet Count: 235 10*3/uL (ref 150–400)
RBC: 4.46 MIL/uL (ref 3.87–5.11)
RDW: 13.7 % (ref 11.5–15.5)
WBC Count: 6.6 10*3/uL (ref 4.0–10.5)
nRBC: 0 % (ref 0.0–0.2)

## 2021-05-14 MED ORDER — IOHEXOL 350 MG/ML SOLN
60.0000 mL | Freq: Once | INTRAVENOUS | Status: AC | PRN
  Administered 2021-05-14: 60 mL via INTRAVENOUS

## 2021-05-19 ENCOUNTER — Inpatient Hospital Stay (HOSPITAL_BASED_OUTPATIENT_CLINIC_OR_DEPARTMENT_OTHER): Payer: Medicare Other | Admitting: Internal Medicine

## 2021-05-19 ENCOUNTER — Other Ambulatory Visit: Payer: Self-pay

## 2021-05-19 VITALS — BP 131/70 | HR 90 | Temp 97.3°F | Resp 19 | Ht 64.0 in | Wt 143.5 lb

## 2021-05-19 DIAGNOSIS — C3490 Malignant neoplasm of unspecified part of unspecified bronchus or lung: Secondary | ICD-10-CM

## 2021-05-19 DIAGNOSIS — E039 Hypothyroidism, unspecified: Secondary | ICD-10-CM | POA: Diagnosis not present

## 2021-05-19 DIAGNOSIS — C3432 Malignant neoplasm of lower lobe, left bronchus or lung: Secondary | ICD-10-CM | POA: Diagnosis present

## 2021-05-19 NOTE — Progress Notes (Signed)
[facility] Telephone:(336) [phone]   Fax:(336) [place], [place] [address]  DIAGNOSIS: Recurrent non-small cell lung cancer, adenocarcinoma with PDL 1 expression of 60% diagnosed in November 2016. The patient was initially diagnosed as stage IB (T2a, N0, M0) adenocarcinoma in March 2016.  PRIOR THERAPY:   1) Status post left video-assisted thoracoscopy, wedge resection left lower lobe, thoracoscopic left lower lobectomy and mediastinal lymph node dissection on 08/14/2014.  2) Immunotherapy with Ketruda (pembrolizumab) 200 mg IV every 3 weeks status post 35 cycles. First dose was given 05/12/2015.  This was discontinued after the patient completed 2 years of treatment.  CURRENT THERAPY: Observation.   INTERVAL HISTORY: Sharon [name] 60 y.o. female returns to the clinic today for follow-up visit.  The patient is feeling fine today with no concerning complaints.  She denied having any current chest pain, shortness of breath, cough or hemoptysis.  She denied any fever or chills.  She has no nausea, vomiting, diarrhea or constipation.  She has no headache or visual changes.  She is currently on levothyroxine 88 mcg p.o. daily and this is monitored by her primary care provider.  The patient had repeat CT scan of the chest performed recently and she is here for evaluation and discussion of her scan results.  MEDICAL HISTORY: Past Medical History:  Diagnosis Date   Aortic insufficiency    Echo 8/09:  EF 55-60, impaired relaxation, mild aortic stenosis (mean 8.1), moderate aortic insufficiency, trace MR, trace TR // Echo 4/18: mild LVH, EF 55-60, no RWMA, Gr 1 DD, mod AI // Echo 5/19: EF 67-34, normal diastolic function, mild aortic stenosis (mean 16, peak 30), moderate aortic insufficiency    Arthritis    Carotid artery disease ([facility]) 03/09/2011   Carotid US 8/15 - R 40-59; L1-39 >> follow-up 1 year // Carotid US 4/18:  ICA 1-39 bilaterally - repeat 1 yr// Carotid US 5/19: Bilateral ICA 1-39   COPD (chronic obstructive pulmonary disease) (HCC)    Coronary artery disease involving native coronary artery of native heart without angina pectoris 11/24/2009   s/p ant STEMI 5/09 >> LHC 5/09 - LM normal; LAD proximal 99; branching DX with one branch occluded and the other severely diseased; LCx okay with irregularities in the OM1; RCA proximal occluded-CTO; EF 30-40, anterior wall HK  >>  PCI: 3 x 18 mm Promus DES, 2.5 x 15 mm Promus DES to the LAD   Emphysema of lung ([facility])    Headache 09/06/2016   History of acute anterior wall MI 12/2007   s/p DES x 2 to LAD   Hyperlipidemia    Ischemic cardiomyopathy    Non-small cell lung cancer ([facility]) dx'd 06/2014   per CT CHEST/PET 2/4 and 07/18/14 @ [facility]   Pneumonia    2015   HX BRONCHITIS     Tobacco user     ALLERGIES:  is allergic to codeine, lipitor [atorvastatin calcium], morphine and related, oxycodone-acetaminophen, metoprolol, and zetia [ezetimibe].  MEDICATIONS:  Current Outpatient Medications  Medication Sig Dispense Refill   acetaminophen (TYLENOL) 500 MG tablet Take 1,000 mg by mouth every 6 (six) hours as needed for moderate pain or headache.     Albuterol Sulfate (PROAIR RESPICLICK) 193 (90 Base) MCG/ACT AEPB Inhale into the lungs.     aspirin (ASPIR-LOW) 81 MG EC tablet Take 81 mg by mouth daily.      cholecalciferol (VITAMIN  D3) 25 MCG (1000 UT) tablet Take 2,000 Units by mouth daily. (Patient not taking: Reported on 11/17/2020)     clopidogrel (PLAVIX) 75 MG tablet Take 1 tablet (75 mg total) by mouth daily. 90 tablet 3   ezetimibe (ZETIA) 10 MG tablet TAKE 1 TABLET BY MOUTH DAILY 90 tablet 3   Fluticasone-Umeclidin-Vilant (TRELEGY ELLIPTA IN) Inhale into the lungs.     levothyroxine (SYNTHROID, LEVOTHROID) 88 MCG tablet Take 88 mcg by mouth daily before breakfast.     risedronate (ACTONEL) 35 MG tablet Take 35 mg by mouth once a week.     rosuvastatin  (CRESTOR) 40 MG tablet Take 1 tablet (40 mg total) by mouth daily. 90 tablet 3   No current facility-administered medications for this visit.    SURGICAL HISTORY:  Past Surgical History:  Procedure Laterality Date   CARDIAC CATHETERIZATION  10/15/2007   EF 30-40%   CESAREAN SECTION     CHOLECYSTECTOMY     CORONARY ANGIOPLASTY WITH STENT PLACEMENT     LAD   ectopic pregnancies requiring laparotomies     IR KYPHO THORACIC WITH BONE BIOPSY  08/14/2018   LEAD REMOVAL Left 08/14/2014   Procedure: CRYO INTERCOSTAL NERVE BLOCK;  Surgeon: Melrose Nakayama, MD;  Location: [facility];  Service: Thoracic;  Laterality: Left;   LOBECTOMY Left 08/14/2014   Procedure: LEFT LOWER LOBECTOMY;  Surgeon: Melrose Nakayama, MD;  Location: [facility];  Service: Thoracic;  Laterality: Left;   NODE DISSECTION Left 08/14/2014   Procedure: NODE DISSECTION, LEFT LOWER LOBE LUNG;  Surgeon: Melrose Nakayama, MD;  Location: [facility];  Service: Thoracic;  Laterality: Left;   SHOULDER SURGERY     LEFT X 2   (REMOVED SOME COLLAR BONE )   TRANSTHORACIC ECHOCARDIOGRAM  10/17/2007   TUBAL LIGATION     US ECHOCARDIOGRAPHY  01/07/2008   EF 55-60%   VIDEO ASSISTED THORACOSCOPY (VATS)/WEDGE RESECTION Left 08/14/2014   Procedure: VIDEO ASSISTED THORACOSCOPY (VATS)/WEDGE RESECTION;  Surgeon: Melrose Nakayama, MD;  Location: [facility];  Service: Thoracic;  Laterality: Left;    REVIEW OF SYSTEMS:  A comprehensive review of systems was negative.   PHYSICAL EXAMINATION: General appearance: alert, cooperative, and no distress Head: Normocephalic, without obvious abnormality, atraumatic Neck: no adenopathy, no JVD, supple, symmetrical, trachea midline, and thyroid not enlarged, symmetric, no tenderness/mass/nodules Lymph nodes: Cervical, supraclavicular, and axillary nodes normal. Resp: clear to auscultation bilaterally Back: negative Cardio: regular rate and rhythm, S1, S2 normal, no murmur, click, rub or gallop GI: soft,  non-tender; bowel sounds normal; no masses,  no organomegaly Extremities: extremities normal, atraumatic, no cyanosis or edema  ECOG PERFORMANCE STATUS: 1 - Symptomatic but completely ambulatory  Blood pressure 131/70, pulse 90, temperature (!) 97.3 F (36.3 C), temperature source Tympanic, resp. rate 19, height 5\' 4"  (1.626 m), weight 143 lb 8 oz (65.1 kg), SpO2 97 %.  LABORATORY DATA: Lab Results  Component Value Date   WBC 6.6 05/14/2021   HGB 14.9 05/14/2021   HCT 44.3 05/14/2021   MCV 99.3 05/14/2021   PLT 235 05/14/2021      Chemistry      Component Value Date/Time   NA 140 05/14/2021 0959   NA 139 05/25/2017 1344   K 3.9 05/14/2021 0959   K 3.5 05/25/2017 1344   CL 107 05/14/2021 0959   CO2 23 05/14/2021 0959   CO2 23 05/25/2017 1344   BUN 11 05/14/2021 0959   BUN 10.0 05/25/2017 1344   CREATININE 0.89  05/14/2021 0959   CREATININE 0.7 05/25/2017 1344      Component Value Date/Time   CALCIUM 8.9 05/14/2021 0959   CALCIUM 9.4 05/25/2017 1344   ALKPHOS 80 05/14/2021 0959   ALKPHOS 94 05/25/2017 1344   AST 26 05/14/2021 0959   AST 23 05/25/2017 1344   ALT 18 05/14/2021 0959   ALT 20 05/25/2017 1344   BILITOT 0.5 05/14/2021 0959   BILITOT 0.38 05/25/2017 1344       RADIOGRAPHIC STUDIES: CT Chest W Contrast  Result Date: 05/16/2021 CLINICAL DATA:  60 year old female with history of non-small cell lung cancer. History of left lower lobectomy and immunotherapy (complete in 2018). Follow-up study. EXAM: CT CHEST WITH CONTRAST TECHNIQUE: Multidetector CT imaging of the chest was performed during intravenous contrast administration. CONTRAST:  70mL OMNIPAQUE IOHEXOL 350 MG/ML SOLN COMPARISON:  Chest CT 11/16/2020. FINDINGS: Cardiovascular: Heart size is normal. There is no significant pericardial fluid, thickening or pericardial calcification. There is aortic atherosclerosis, as well as atherosclerosis of the great vessels of the mediastinum and the coronary arteries,  including calcified atherosclerotic plaque in the left main, left anterior descending, left circumflex and right coronary arteries. Severe calcifications of the aortic valve. Curvilinear hypoattenuation in the apex of the left ventricle, likely to reflect some fibrofatty metaplasia from prior distal LAD territory myocardial infarction(s). Mediastinum/Nodes: No pathologically enlarged mediastinal or hilar lymph nodes. Hilar esophagus is unremarkable in appearance. No axillary lymphadenopathy. Lungs/Pleura: Status post left lower lobectomy. Compensatory hyperexpansion of the left upper lobe. A few scattered tiny 2-4 mm pulmonary nodules are noted throughout the lungs bilaterally, stable compared to prior examinations, considered benign. No other larger more suspicious appearing pulmonary nodules or masses are noted. No acute consolidative airspace disease. No pleural effusions. Mild diffuse bronchial wall thickening with mild centrilobular and paraseptal emphysema. Upper Abdomen: Atherosclerotic calcifications in the abdominal aorta. Status post cholecystectomy. Diffuse low attenuation throughout the visualized hepatic parenchyma, indicative of hepatic steatosis. Musculoskeletal: Chronic compression fractures of T9 and T12, most severe at T12 where there is 40% loss of anterior vertebral body height and post vertebroplasty changes, similar to the prior examination. There are no aggressive appearing lytic or blastic lesions noted in the visualized portions of the skeleton. IMPRESSION: 1. Status post left lower lobectomy. No findings to suggest recurrent or metastatic disease in the thorax. 2. Aortic atherosclerosis, in addition to left main and 3 vessel coronary artery disease. Evidence of prior distal LAD territory myocardial infarction(s), as above. 3. Mild diffuse bronchial wall thickening with mild centrilobular and paraseptal emphysema; imaging findings suggestive of underlying COPD. Aortic Atherosclerosis  (ICD10-I70.0) and Emphysema (ICD10-J43.9). Electronically Signed   By: Vinnie Langton M.D.   On: 05/16/2021 11:14     ASSESSMENT AND PLAN:  This is a very pleasant 60 years old white female with recurrent non-small cell lung cancer, adenocarcinoma with positive PDL 1 expression of 60%. The patient completed treatment with immunotherapy with Ketruda 200 mg IV every 3 weeks status post 35 cycles. The patient is currently on observation and she is feeling fine today with no concerning complaints. She had repeat CT scan of the chest performed recently.  I personally and independently reviewed the scan and discussed the results with the patient today. Her scan showed no concerning findings for disease recurrence or metastasis. I recommended for her to continue on observation and repeat CT scan of the chest in 6 months. For the hypothyroidism, she is currently on levothyroxine 88 mcg p.o. daily and this is monitored  by her primary care provider. The patient was advised to call immediately if she has any other concerning symptoms in the interval.  All questions were answered. The patient knows to call the clinic with any problems, questions or concerns. We can certainly see the patient much sooner if necessary.  Disclaimer: This note was dictated with voice recognition software. Similar sounding words can inadvertently be transcribed and may not be corrected upon review.

## 2021-05-28 ENCOUNTER — Telehealth: Payer: Self-pay | Admitting: Internal Medicine

## 2021-05-28 NOTE — Telephone Encounter (Signed)
Sch per 12/29 los, left msg

## 2021-06-02 ENCOUNTER — Telehealth: Payer: Self-pay | Admitting: Internal Medicine

## 2021-06-02 NOTE — Telephone Encounter (Signed)
Rescheduled June appointments per patient's request, patient is notified of upcoming appointments/

## 2021-11-08 ENCOUNTER — Telehealth: Payer: Self-pay | Admitting: Cardiovascular Disease

## 2021-11-08 NOTE — Telephone Encounter (Signed)
Patient states she has moved and found a local cardiologist.

## 2021-11-12 ENCOUNTER — Other Ambulatory Visit: Payer: Medicare Other

## 2021-11-16 ENCOUNTER — Inpatient Hospital Stay: Payer: Medicare Other | Attending: Internal Medicine

## 2021-11-16 ENCOUNTER — Other Ambulatory Visit: Payer: Self-pay | Admitting: Lab

## 2021-11-16 ENCOUNTER — Encounter (HOSPITAL_COMMUNITY): Payer: Self-pay

## 2021-11-16 ENCOUNTER — Other Ambulatory Visit: Payer: Self-pay

## 2021-11-16 ENCOUNTER — Ambulatory Visit (HOSPITAL_COMMUNITY)
Admission: RE | Admit: 2021-11-16 | Discharge: 2021-11-16 | Disposition: A | Discharge status: Home / Self Care | Payer: Medicare Other | Source: Ambulatory Visit | Attending: Internal Medicine | Admitting: Internal Medicine

## 2021-11-16 DIAGNOSIS — Z902 Acquired absence of lung [part of]: Secondary | ICD-10-CM | POA: Insufficient documentation

## 2021-11-16 DIAGNOSIS — E039 Hypothyroidism, unspecified: Secondary | ICD-10-CM | POA: Insufficient documentation

## 2021-11-16 DIAGNOSIS — C3490 Malignant neoplasm of unspecified part of unspecified bronchus or lung: Secondary | ICD-10-CM | POA: Insufficient documentation

## 2021-11-16 DIAGNOSIS — Z85118 Personal history of other malignant neoplasm of bronchus and lung: Secondary | ICD-10-CM | POA: Insufficient documentation

## 2021-11-16 DIAGNOSIS — Z79899 Other long term (current) drug therapy: Secondary | ICD-10-CM | POA: Insufficient documentation

## 2021-11-16 DIAGNOSIS — M5489 Other dorsalgia: Secondary | ICD-10-CM | POA: Insufficient documentation

## 2021-11-16 LAB — CMP (CANCER CENTER ONLY)
ALT: 11 U/L (ref 0–44)
AST: 30 U/L (ref 15–41)
Albumin: 4.2 g/dL (ref 3.5–5.0)
Alkaline Phosphatase: 87 U/L (ref 38–126)
Anion gap: 7 (ref 5–15)
BUN: 16 mg/dL (ref 8–23)
CO2: 26 mmol/L (ref 22–32)
Calcium: 9.5 mg/dL (ref 8.9–10.3)
Chloride: 105 mmol/L (ref 98–111)
Creatinine: 0.97 mg/dL (ref 0.44–1.00)
GFR, Estimated: >60 mL/min (ref >60)
Glucose, Bld: 89 mg/dL (ref 70–99)
Potassium: 3.9 mmol/L (ref 3.5–5.1)
Sodium: 138 mmol/L (ref 135–145)
Total Bilirubin: 0.4 mg/dL (ref 0.3–1.2)
Total Protein: 7.3 g/dL (ref 6.5–8.1)

## 2021-11-16 LAB — CBC WITH DIFFERENTIAL (CANCER CENTER ONLY)
Abs Immature Granulocytes: 0.02 10*3/uL (ref 0.00–0.07)
Basophils Absolute: 0.1 10*3/uL (ref 0.0–0.1)
Basophils Relative: 1 %
Eosinophils Absolute: 0.2 10*3/uL (ref 0.0–0.5)
Eosinophils Relative: 3 %
HCT: 42.9 % (ref 36.0–46.0)
Hemoglobin: 14.6 g/dL (ref 12.0–15.0)
Immature Granulocytes: 0 %
Lymphocytes Relative: 33 %
Lymphs Abs: 2.4 10*3/uL (ref 0.7–4.0)
MCH: 33.3 pg (ref 26.0–34.0)
MCHC: 34 g/dL (ref 30.0–36.0)
MCV: 97.7 fL (ref 80.0–100.0)
Monocytes Absolute: 0.5 10*3/uL (ref 0.1–1.0)
Monocytes Relative: 7 %
Neutro Abs: 4.1 10*3/uL (ref 1.7–7.7)
Neutrophils Relative %: 56 %
Platelet Count: 208 10*3/uL (ref 150–400)
RBC: 4.39 MIL/uL (ref 3.87–5.11)
RDW: 14.3 % (ref 11.5–15.5)
WBC Count: 7.2 10*3/uL (ref 4.0–10.5)
nRBC: 0 % (ref 0.0–0.2)

## 2021-11-16 MED ORDER — SODIUM CHLORIDE (PF) 0.9 % IJ SOLN
INTRAMUSCULAR | Status: AC
  Filled 2021-11-16: qty 50

## 2021-11-16 MED ORDER — IOHEXOL 300 MG/ML  SOLN
75.0000 mL | Freq: Once | INTRAMUSCULAR | Status: AC | PRN
  Administered 2021-11-16: 75 mL via INTRAVENOUS

## 2021-11-17 ENCOUNTER — Inpatient Hospital Stay (HOSPITAL_BASED_OUTPATIENT_CLINIC_OR_DEPARTMENT_OTHER): Payer: Medicare Other | Admitting: Internal Medicine

## 2021-11-17 ENCOUNTER — Ambulatory Visit: Payer: Medicare Other | Admitting: Internal Medicine

## 2021-11-17 VITALS — BP 140/59 | HR 72 | Temp 97.6°F | Resp 17 | Wt 143.1 lb

## 2021-11-17 DIAGNOSIS — Z85118 Personal history of other malignant neoplasm of bronchus and lung: Secondary | ICD-10-CM | POA: Diagnosis present

## 2021-11-17 DIAGNOSIS — C3490 Malignant neoplasm of unspecified part of unspecified bronchus or lung: Secondary | ICD-10-CM

## 2021-11-17 DIAGNOSIS — Z902 Acquired absence of lung [part of]: Secondary | ICD-10-CM | POA: Diagnosis not present

## 2021-11-17 DIAGNOSIS — Z79899 Other long term (current) drug therapy: Secondary | ICD-10-CM | POA: Diagnosis not present

## 2021-11-17 DIAGNOSIS — E039 Hypothyroidism, unspecified: Secondary | ICD-10-CM | POA: Diagnosis not present

## 2021-11-17 DIAGNOSIS — M5489 Other dorsalgia: Secondary | ICD-10-CM | POA: Diagnosis not present

## 2021-11-17 NOTE — Addendum Note (Signed)
Addended by: Ardeen Garland on: 11/17/2021 11:55 AM   Modules accepted: Orders

## 2021-11-17 NOTE — Progress Notes (Signed)
[facility] [phone]   Fax:(336) [place], [place] [address]  DIAGNOSIS: Recurrent non-small cell lung cancer, adenocarcinoma with PDL 1 expression of 60% diagnosed in November 2016. The patient was initially diagnosed as stage IB (T2a, N0, M0) adenocarcinoma in March 2016.  PRIOR THERAPY:   1) Status post left video-assisted thoracoscopy, wedge resection left lower lobe, thoracoscopic left lower lobectomy and mediastinal lymph node dissection on 08/14/2014.  2) Immunotherapy with Ketruda (pembrolizumab) 200 mg IV every 3 weeks status post 35 cycles. First dose was given 05/12/2015.  This was discontinued after the patient completed 2 years of treatment.  CURRENT THERAPY: Observation.   INTERVAL HISTORY: Sharon [name] 61 y.o. female returns to the clinic today for follow-up visit.  The patient is feeling fine today with no concerning complaints except for mild pain in the back.  She was found recently to have compression fracture of T9.  She is scheduled to see orthopedic surgery at [facility] in [facility].  She moved recently to that area to be close to her daughter.  She denied having any current chest pain, shortness of breath, cough or hemoptysis.  She has no nausea, vomiting, diarrhea or constipation.  She has no headache or visual changes.  She has no recent weight loss or night sweats.  She is here today for evaluation with repeat CT scan of the chest for restaging of her disease.  MEDICAL HISTORY: Past Medical History:  Diagnosis Date   Aortic insufficiency    Echo 8/09:  EF 55-60, impaired relaxation, mild aortic stenosis (mean 8.1), moderate aortic insufficiency, trace MR, trace TR // Echo 4/18: mild LVH, EF 55-60, no RWMA, Gr 1 DD, mod AI // Echo 5/19: EF 14-48, normal diastolic function, mild aortic stenosis (mean 16, peak 30), moderate aortic insufficiency     Arthritis    Carotid artery disease ([place]) 03/09/2011   Carotid US 8/15 - R 40-59; L1-39 >> follow-up 1 year // Carotid US 4/18: ICA 1-39 bilaterally - repeat 1 yr// Carotid US 5/19: Bilateral ICA 1-39   COPD (chronic obstructive pulmonary disease) (HCC)    Coronary artery disease involving native coronary artery of native heart without angina pectoris 11/24/2009   s/p ant STEMI 5/09 >> LHC 5/09 - LM normal; LAD proximal 99; branching DX with one branch occluded and the other severely diseased; LCx okay with irregularities in the OM1; RCA proximal occluded-CTO; EF 30-40, anterior wall HK  >>  PCI: 3 x 18 mm Promus DES, 2.5 x 15 mm Promus DES to the LAD   Emphysema of lung ([facility])    Headache 09/06/2016   History of acute anterior wall MI 12/2007   s/p DES x 2 to LAD   Hyperlipidemia    Ischemic cardiomyopathy    Non-small cell lung cancer ([facility]) dx'd 06/2014   per CT CHEST/PET 2/4 and 07/18/14 @ [facility]   Pneumonia    2015   HX BRONCHITIS     Tobacco user     ALLERGIES:  is allergic to codeine, lipitor [atorvastatin calcium], morphine and related, oxycodone-acetaminophen, metoprolol, and zetia [ezetimibe].  MEDICATIONS:  Current Outpatient Medications  Medication Sig Dispense Refill   acetaminophen (TYLENOL) 500 MG tablet Take 1,000 mg by mouth every 6 (six) hours as needed for moderate pain or headache.     Albuterol Sulfate (PROAIR RESPICLICK) 185 (90 Base) MCG/ACT AEPB Inhale into  the lungs.     aspirin (ASPIR-LOW) 81 MG EC tablet Take 81 mg by mouth daily.      cholecalciferol (VITAMIN D3) 25 MCG (1000 UT) tablet Take 2,000 Units by mouth daily. (Patient not taking: Reported on 11/17/2020)     clopidogrel (PLAVIX) 75 MG tablet Take 1 tablet (75 mg total) by mouth daily. 90 tablet 3   ezetimibe (ZETIA) 10 MG tablet TAKE 1 TABLET BY MOUTH DAILY 90 tablet 3   levothyroxine (SYNTHROID, LEVOTHROID) 88 MCG tablet Take 88 mcg by mouth daily before breakfast.     rosuvastatin (CRESTOR) 40 MG  tablet Take 1 tablet (40 mg total) by mouth daily. 90 tablet 3   No current facility-administered medications for this visit.    SURGICAL HISTORY:  Past Surgical History:  Procedure Laterality Date   CARDIAC CATHETERIZATION  10/15/2007   EF 30-40%   CESAREAN SECTION     CHOLECYSTECTOMY     CORONARY ANGIOPLASTY WITH STENT PLACEMENT     LAD   ectopic pregnancies requiring laparotomies     IR KYPHO THORACIC WITH BONE BIOPSY  08/14/2018   LEAD REMOVAL Left 08/14/2014   Procedure: CRYO INTERCOSTAL NERVE BLOCK;  Surgeon: Melrose Nakayama, MD;  Location: [facility];  Service: Thoracic;  Laterality: Left;   LOBECTOMY Left 08/14/2014   Procedure: LEFT LOWER LOBECTOMY;  Surgeon: Melrose Nakayama, MD;  Location: [facility];  Service: Thoracic;  Laterality: Left;   NODE DISSECTION Left 08/14/2014   Procedure: NODE DISSECTION, LEFT LOWER LOBE LUNG;  Surgeon: Melrose Nakayama, MD;  Location: [facility];  Service: Thoracic;  Laterality: Left;   SHOULDER SURGERY     LEFT X 2   (REMOVED SOME COLLAR BONE )   TRANSTHORACIC ECHOCARDIOGRAM  10/17/2007   TUBAL LIGATION     US ECHOCARDIOGRAPHY  01/07/2008   EF 55-60%   VIDEO ASSISTED THORACOSCOPY (VATS)/WEDGE RESECTION Left 08/14/2014   Procedure: VIDEO ASSISTED THORACOSCOPY (VATS)/WEDGE RESECTION;  Surgeon: Melrose Nakayama, MD;  Location: [facility];  Service: Thoracic;  Laterality: Left;    REVIEW OF SYSTEMS:  A comprehensive review of systems was negative except for: Constitutional: positive for fatigue Musculoskeletal: positive for back pain   PHYSICAL EXAMINATION: General appearance: alert, cooperative, and no distress Head: Normocephalic, without obvious abnormality, atraumatic Neck: no adenopathy, no JVD, supple, symmetrical, trachea midline, and thyroid not enlarged, symmetric, no tenderness/mass/nodules Lymph nodes: Cervical, supraclavicular, and axillary nodes normal. Resp: clear to auscultation bilaterally Back: negative Cardio: regular rate and  rhythm, S1, S2 normal, no murmur, click, rub or gallop GI: soft, non-tender; bowel sounds normal; no masses,  no organomegaly Extremities: extremities normal, atraumatic, no cyanosis or edema  ECOG PERFORMANCE STATUS: 1 - Symptomatic but completely ambulatory  Blood pressure (!) 140/59, pulse 72, temperature 97.6 F (36.4 C), temperature source Tympanic, resp. rate 17, weight 143 lb 1 oz (64.9 kg), SpO2 98 %.  LABORATORY DATA: Lab Results  Component Value Date   WBC 7.2 11/16/2021   HGB 14.6 11/16/2021   HCT 42.9 11/16/2021   MCV 97.7 11/16/2021   PLT 208 11/16/2021      Chemistry      Component Value Date/Time   NA 138 11/16/2021 0754   NA 139 05/25/2017 1344   K 3.9 11/16/2021 0754   K 3.5 05/25/2017 1344   CL 105 11/16/2021 0754   CO2 26 11/16/2021 0754   CO2 23 05/25/2017 1344   BUN 16 11/16/2021 0754   BUN 10.0 05/25/2017 1344  CREATININE 0.97 11/16/2021 0754   CREATININE 0.7 05/25/2017 1344      Component Value Date/Time   CALCIUM 9.5 11/16/2021 0754   CALCIUM 9.4 05/25/2017 1344   ALKPHOS 87 11/16/2021 0754   ALKPHOS 94 05/25/2017 1344   AST 30 11/16/2021 0754   AST 23 05/25/2017 1344   ALT 11 11/16/2021 0754   ALT 20 05/25/2017 1344   BILITOT 0.4 11/16/2021 0754   BILITOT 0.38 05/25/2017 1344       RADIOGRAPHIC STUDIES: CT Chest W Contrast  Result Date: 11/16/2021 CLINICAL DATA:  Primary Cancer Type: Lung Imaging Indication: Routine surveillance Interval therapy since last imaging? No Initial Cancer Diagnosis Date: 08/14/2014; Established by: Biopsy-proven Detailed Pathology: Recurrent non-small cell lung cancer. Initially diagnosed as stage IB adenocarcinoma in March 2016. Primary Tumor location:   Left lower lobe. Recurrence? Yes; Date(s) of recurrence: 04/30/2015; Established by: Biopsy-proven Surgeries: Left lower lobe wedge resection 08/14/2014. Coronary stents. Cholecystectomy. Chemotherapy: No Immunotherapy?  Yes; Type: Keytruda; Ongoing? No  Radiation therapy? No * Tracking Code: BO * EXAM: CT CHEST WITH CONTRAST TECHNIQUE: Multidetector CT imaging of the chest was performed during intravenous contrast administration. RADIATION DOSE REDUCTION: This exam was performed according to the departmental dose-optimization program which includes automated exposure control, adjustment of the mA and/or kV according to patient size and/or use of iterative reconstruction technique. CONTRAST:  23mL OMNIPAQUE IOHEXOL 300 MG/ML  SOLN COMPARISON:  Most recent CT chest 05/14/2021. 04/03/2015 PET-CT. FINDINGS: Cardiovascular: Coronary artery calcification and aortic atherosclerotic calcification. Mediastinum/Nodes: No axillary or supraclavicular adenopathy. No mediastinal or hilar adenopathy. No pericardial fluid. Esophagus normal. Lungs/Pleura: Centrilobular emphysema in the upper lobes. LEFT lower lobe resection. Nodular thickening at the LEFT lung base measures 23 mm x 7 mm (image 105/series 7) is unchanged from 25 mm x 9 mm. No new nodularity in LEFT lung. Upper Abdomen: Limited view of the liver, kidneys, pancreas are unremarkable. Normal adrenal glands. Musculoskeletal: No aggressive osseous lesion. IMPRESSION: 1. No evidence of lung cancer recurrence. 2. Stable postoperative change in the LEFT lung. 3. Stable nodular density at LEFT lung base. 4. Aortic Atherosclerosis (ICD10-I70.0) and Emphysema (ICD10-J43.9). Electronically Signed   By: Suzy Bouchard M.D.   On: 11/16/2021 12:40     ASSESSMENT AND PLAN:  This is a very pleasant 61 years old white female with recurrent non-small cell lung cancer, adenocarcinoma with positive PDL 1 expression of 60%. The patient completed treatment with immunotherapy with Ketruda 200 mg IV every 3 weeks status post 35 cycles. The patient has been in observation now for several years after completion of her immunotherapy.   She has no complaints today except for the recent back pain from the compression fracture.  The  patient had repeat CT scan of the chest performed recently.  I personally and independently reviewed the scan images and discussed the results with the patient today. Her scan showed no concerning findings for disease progression. I recommended for her to continue on observation with repeat CT scan of the chest in 6 months. For the hypothyroidism, she is currently on levothyroxine 88 mcg p.o. daily and this is monitored by her primary care provider. She was advised to call immediately if she has any concerning symptoms in the interval.  All questions were answered. The patient knows to call the clinic with any problems, questions or concerns. We can certainly see the patient much sooner if necessary.  Disclaimer: This note was dictated with voice recognition software. Similar sounding words can inadvertently be transcribed  and may not be corrected upon review.

## 2022-05-17 ENCOUNTER — Ambulatory Visit (HOSPITAL_COMMUNITY)
Admission: RE | Admit: 2022-05-17 | Discharge: 2022-05-17 | Disposition: A | Discharge status: Home / Self Care | Payer: Medicare Other | Source: Ambulatory Visit | Attending: *Deleted | Admitting: *Deleted

## 2022-05-17 ENCOUNTER — Inpatient Hospital Stay: Payer: Medicare Other

## 2022-05-17 ENCOUNTER — Inpatient Hospital Stay: Payer: Medicare Other | Attending: Internal Medicine | Admitting: Internal Medicine

## 2022-05-17 VITALS — BP 122/56 | HR 76 | Temp 98.2°F | Resp 16 | Wt 140.9 lb

## 2022-05-17 DIAGNOSIS — C3490 Malignant neoplasm of unspecified part of unspecified bronchus or lung: Secondary | ICD-10-CM

## 2022-05-17 DIAGNOSIS — Z9221 Personal history of antineoplastic chemotherapy: Secondary | ICD-10-CM | POA: Diagnosis not present

## 2022-05-17 DIAGNOSIS — E039 Hypothyroidism, unspecified: Secondary | ICD-10-CM | POA: Diagnosis not present

## 2022-05-17 DIAGNOSIS — M549 Dorsalgia, unspecified: Secondary | ICD-10-CM | POA: Diagnosis not present

## 2022-05-17 DIAGNOSIS — C3492 Malignant neoplasm of unspecified part of left bronchus or lung: Secondary | ICD-10-CM | POA: Insufficient documentation

## 2022-05-17 DIAGNOSIS — Z79890 Hormone replacement therapy (postmenopausal): Secondary | ICD-10-CM | POA: Diagnosis not present

## 2022-05-17 DIAGNOSIS — M4854XA Collapsed vertebra, not elsewhere classified, thoracic region, initial encounter for fracture: Secondary | ICD-10-CM | POA: Diagnosis not present

## 2022-05-17 LAB — CBC WITH DIFFERENTIAL (CANCER CENTER ONLY)
Abs Immature Granulocytes: 0.01 10*3/uL (ref 0.00–0.07)
Basophils Absolute: 0 10*3/uL (ref 0.0–0.1)
Basophils Relative: 1 %
Eosinophils Absolute: 0.2 10*3/uL (ref 0.0–0.5)
Eosinophils Relative: 2 %
HCT: 43.1 % (ref 36.0–46.0)
Hemoglobin: 14.9 g/dL (ref 12.0–15.0)
Immature Granulocytes: 0 %
Lymphocytes Relative: 40 %
Lymphs Abs: 2.7 10*3/uL (ref 0.7–4.0)
MCH: 34 pg (ref 26.0–34.0)
MCHC: 34.6 g/dL (ref 30.0–36.0)
MCV: 98.4 fL (ref 80.0–100.0)
Monocytes Absolute: 0.8 10*3/uL (ref 0.1–1.0)
Monocytes Relative: 11 %
Neutro Abs: 3.2 10*3/uL (ref 1.7–7.7)
Neutrophils Relative %: 46 %
Platelet Count: 179 10*3/uL (ref 150–400)
RBC: 4.38 MIL/uL (ref 3.87–5.11)
RDW: 12.7 % (ref 11.5–15.5)
WBC Count: 6.9 10*3/uL (ref 4.0–10.5)
nRBC: 0 % (ref 0.0–0.2)

## 2022-05-17 LAB — CMP (CANCER CENTER ONLY)
ALT: 20 U/L (ref 0–44)
AST: 29 U/L (ref 15–41)
Albumin: 3.6 g/dL (ref 3.5–5.0)
Alkaline Phosphatase: 65 U/L (ref 38–126)
Anion gap: 6 (ref 5–15)
BUN: 15 mg/dL (ref 8–23)
CO2: 24 mmol/L (ref 22–32)
Calcium: 9.6 mg/dL (ref 8.9–10.3)
Chloride: 110 mmol/L (ref 98–111)
Creatinine: 0.81 mg/dL (ref 0.44–1.00)
GFR, Estimated: >60 mL/min (ref >60)
Glucose, Bld: 78 mg/dL (ref 70–99)
Potassium: 4.3 mmol/L (ref 3.5–5.1)
Sodium: 140 mmol/L (ref 135–145)
Total Bilirubin: 0.5 mg/dL (ref 0.3–1.2)
Total Protein: 7.4 g/dL (ref 6.5–8.1)

## 2022-05-17 MED ORDER — IOHEXOL 300 MG/ML  SOLN
75.0000 mL | Freq: Once | INTRAMUSCULAR | Status: AC | PRN
  Administered 2022-05-17: 75 mL via INTRAVENOUS

## 2022-05-17 MED ORDER — SODIUM CHLORIDE (PF) 0.9 % IJ SOLN
INTRAMUSCULAR | Status: AC
  Filled 2022-05-17: qty 50

## 2022-05-17 NOTE — Progress Notes (Signed)
[facility] Telephone:(336) [phone]   Fax:(336) [phone]  OFFICE PROGRESS NOTE  Rhea Bleacher, NP [place] [address]  DIAGNOSIS: Recurrent non-small cell lung cancer, adenocarcinoma with PDL 1 expression of 60% diagnosed in November 2016. The patient was initially diagnosed as stage IB (T2a, N0, M0) adenocarcinoma in March 2016.  PRIOR THERAPY:   1) Status post left video-assisted thoracoscopy, wedge resection left lower lobe, thoracoscopic left lower lobectomy and mediastinal lymph node dissection on 08/14/2014.  2) Immunotherapy with Ketruda (pembrolizumab) 200 mg IV every 3 weeks status post 35 cycles. First dose was given 05/12/2015.  This was discontinued after the patient completed 2 years of treatment.  CURRENT THERAPY: Observation.   INTERVAL HISTORY: Sharon [name] 61 y.o. female returns to the clinic today for 6 months follow-up visit.  The patient is feeling fine today with no concerning complaints except for the intermittent back pain from the compression fractures.  She underwent kyphoplasty by interventional radiology in 2020.  She denied having any current chest pain, shortness of breath, cough or hemoptysis.  She has no nausea, vomiting, diarrhea or constipation.  She has no headache or visual changes.  She denied having any weight loss or night sweats.  MEDICAL HISTORY: Past Medical History:  Diagnosis Date   Aortic insufficiency    Echo 8/09:  EF 55-60, impaired relaxation, mild aortic stenosis (mean 8.1), moderate aortic insufficiency, trace MR, trace TR // Echo 4/18: mild LVH, EF 55-60, no RWMA, Gr 1 DD, mod AI // Echo 5/19: EF 43-15, normal diastolic function, mild aortic stenosis (mean 16, peak 30), moderate aortic insufficiency    Arthritis    Carotid artery disease ([place]) 03/09/2011   Carotid US 8/15 - R 40-59; L1-39 >> follow-up 1 year // Carotid US 4/18: ICA 1-39 bilaterally - repeat 1 yr// Carotid US 5/19: Bilateral ICA 1-39    COPD (chronic obstructive pulmonary disease) (HCC)    Coronary artery disease involving native coronary artery of native heart without angina pectoris 11/24/2009   s/p ant STEMI 5/09 >> LHC 5/09 - LM normal; LAD proximal 99; branching DX with one branch occluded and the other severely diseased; LCx okay with irregularities in the OM1; RCA proximal occluded-CTO; EF 30-40, anterior wall HK  >>  PCI: 3 x 18 mm Promus DES, 2.5 x 15 mm Promus DES to the LAD   Emphysema of lung ([place])    Headache 09/06/2016   History of acute anterior wall MI 12/2007   s/p DES x 2 to LAD   Hyperlipidemia    Ischemic cardiomyopathy    Non-small cell lung cancer ([place]) dx'd 06/2014   per CT CHEST/PET 2/4 and 07/18/14 @ [facility]   Pneumonia    2015   HX BRONCHITIS     Tobacco user     ALLERGIES:  is allergic to codeine, lipitor [atorvastatin calcium], morphine and related, oxycodone-acetaminophen, and metoprolol.  MEDICATIONS:  Current Outpatient Medications  Medication Sig Dispense Refill   acetaminophen (TYLENOL) 500 MG tablet Take 1,000 mg by mouth every 6 (six) hours as needed for moderate pain or headache.     Albuterol Sulfate (PROAIR RESPICLICK) 400 (90 Base) MCG/ACT AEPB Inhale into the lungs.     aspirin (ASPIR-LOW) 81 MG EC tablet Take 81 mg by mouth daily.      cholecalciferol (VITAMIN D3) 25 MCG (1000 UT) tablet Take 2,000 Units by mouth daily. (Patient not taking: Reported on 11/17/2020)  cyclobenzaprine (FLEXERIL) 10 MG tablet Take 10 mg by mouth 2 (two) times daily.     ezetimibe (ZETIA) 10 MG tablet TAKE 1 TABLET BY MOUTH DAILY 90 tablet 3   levothyroxine (SYNTHROID, LEVOTHROID) 88 MCG tablet Take 88 mcg by mouth daily before breakfast.     nitroGLYCERIN (NITROSTAT) 0.4 MG SL tablet Place under the tongue.     rosuvastatin (CRESTOR) 40 MG tablet Take 1 tablet (40 mg total) by mouth daily. 90 tablet 3   No current facility-administered medications for this visit.   Facility-Administered  Medications Ordered in Other Visits  Medication Dose Route Frequency Provider Last Rate Last Admin   sodium chloride (PF) 0.9 % injection             SURGICAL HISTORY:  Past Surgical History:  Procedure Laterality Date   CARDIAC CATHETERIZATION  10/15/2007   EF 30-40%   CESAREAN SECTION     CHOLECYSTECTOMY     CORONARY ANGIOPLASTY WITH STENT PLACEMENT     LAD   ectopic pregnancies requiring laparotomies     IR KYPHO THORACIC WITH BONE BIOPSY  08/14/2018   LEAD REMOVAL Left 08/14/2014   Procedure: CRYO INTERCOSTAL NERVE BLOCK;  Surgeon: Melrose Nakayama, MD;  Location: [facility];  Service: Thoracic;  Laterality: Left;   LOBECTOMY Left 08/14/2014   Procedure: LEFT LOWER LOBECTOMY;  Surgeon: Melrose Nakayama, MD;  Location: [facility];  Service: Thoracic;  Laterality: Left;   NODE DISSECTION Left 08/14/2014   Procedure: NODE DISSECTION, LEFT LOWER LOBE LUNG;  Surgeon: Melrose Nakayama, MD;  Location: [facility];  Service: Thoracic;  Laterality: Left;   SHOULDER SURGERY     LEFT X 2   (REMOVED SOME COLLAR BONE )   TRANSTHORACIC ECHOCARDIOGRAM  10/17/2007   TUBAL LIGATION     US ECHOCARDIOGRAPHY  01/07/2008   EF 55-60%   VIDEO ASSISTED THORACOSCOPY (VATS)/WEDGE RESECTION Left 08/14/2014   Procedure: VIDEO ASSISTED THORACOSCOPY (VATS)/WEDGE RESECTION;  Surgeon: Melrose Nakayama, MD;  Location: [facility];  Service: Thoracic;  Laterality: Left;    REVIEW OF SYSTEMS:  A comprehensive review of systems was negative except for: Musculoskeletal: positive for back pain   PHYSICAL EXAMINATION: General appearance: alert, cooperative, and no distress Head: Normocephalic, without obvious abnormality, atraumatic Neck: no adenopathy, no JVD, supple, symmetrical, trachea midline, and thyroid not enlarged, symmetric, no tenderness/mass/nodules Lymph nodes: Cervical, supraclavicular, and axillary nodes normal. Resp: clear to auscultation bilaterally Back: negative Cardio: regular rate and rhythm, S1, S2  normal, no murmur, click, rub or gallop GI: soft, non-tender; bowel sounds normal; no masses,  no organomegaly Extremities: extremities normal, atraumatic, no cyanosis or edema  ECOG PERFORMANCE STATUS: 1 - Symptomatic but completely ambulatory  Blood pressure (!) 122/56, pulse 76, temperature 98.2 F (36.8 C), temperature source Oral, resp. rate 16, weight 140 lb 14.4 oz (63.9 kg), SpO2 96 %.  LABORATORY DATA: Lab Results  Component Value Date   WBC 6.9 05/17/2022   HGB 14.9 05/17/2022   HCT 43.1 05/17/2022   MCV 98.4 05/17/2022   PLT 179 05/17/2022      Chemistry      Component Value Date/Time   NA 140 05/17/2022 0814   NA 139 05/25/2017 1344   K 4.3 05/17/2022 0814   K 3.5 05/25/2017 1344   CL 110 05/17/2022 0814   CO2 24 05/17/2022 0814   CO2 23 05/25/2017 1344   BUN 15 05/17/2022 0814   BUN 10.0 05/25/2017 1344   CREATININE 0.81 05/17/2022  [id number]   CREATININE 0.7 05/25/2017 1344      Component Value Date/Time   CALCIUM 9.6 05/17/2022 0814   CALCIUM 9.4 05/25/2017 1344   ALKPHOS 65 05/17/2022 0814   ALKPHOS 94 05/25/2017 1344   AST 29 05/17/2022 0814   AST 23 05/25/2017 1344   ALT 20 05/17/2022 0814   ALT 20 05/25/2017 1344   BILITOT 0.5 05/17/2022 0814   BILITOT 0.38 05/25/2017 1344       RADIOGRAPHIC STUDIES: CT Chest W Contrast  Result Date: 05/17/2022 CLINICAL DATA:  Restaging, non-small cell lung cancer. * Tracking Code: BO * EXAM: CT CHEST WITH CONTRAST TECHNIQUE: Multidetector CT imaging of the chest was performed during intravenous contrast administration. RADIATION DOSE REDUCTION: This exam was performed according to the departmental dose-optimization program which includes automated exposure control, adjustment of the mA and/or kV according to patient size and/or use of iterative reconstruction technique. CONTRAST:  42mL OMNIPAQUE IOHEXOL 300 MG/ML  SOLN COMPARISON:  11/16/2021 FINDINGS: Cardiovascular: Normal heart size. Aortic atherosclerosis and  coronary artery calcifications. No significant pericardial effusion. Mediastinum/Nodes: Thyroid gland, trachea, and esophagus are unremarkable. No enlarged axillary lymph nodes. No mediastinal or hilar adenopathy. Lungs/Pleura: Moderate paraseptal and centrilobular emphysema. No pleural fluid or airspace disease. Status post left lower lobectomy. Wedge resection with chain sutures identified within the anterior left Lung. Bandlike nodular density in the posterior left base is again seen and appears unchanged from the previous exam measuring 2.3 x 0.7 cm, image 15/5. Stable subpleural nodule in the superior segment of right lower lobe measuring 6 mm, image 60/5. Upper Abdomen: No acute abnormality.  Cholecystectomy. Musculoskeletal: Stable treated compression deformities within the mid and lower thoracic spine. No acute or suspicious osseous findings. IMPRESSION: 1. Stable CT of the chest. No specific findings identified to suggest recurrent or metastatic disease. 2. Stable bandlike nodular density in the posterior left base. 3. Stable 6 mm subpleural nodule in the superior segment of right lower lobe. 4. Coronary artery calcifications. 5. Aortic Atherosclerosis (ICD10-I70.0) and Emphysema (ICD10-J43.9). Electronically Signed   By: Kerby Moors M.D.   On: 05/17/2022 10:18     ASSESSMENT AND PLAN:  This is a very pleasant 61 years old white female with recurrent non-small cell lung cancer, adenocarcinoma with positive PDL 1 expression of 60%. The patient completed treatment with immunotherapy with Ketruda 200 mg IV every 3 weeks status post 35 cycles. The patient has been in observation now for several years after completion of her immunotherapy.   The patient is doing fine today with no concerning complaints except for the intermittent back pain from the compression fracture that was treated with kyphoplasty. She had repeat CT scan of the chest performed recently.  I personally and independently reviewed  the scan and discussed the result with the patient today. Her scan showed no concerning findings for disease progression. I recommended for her to continue on observation with repeat CT scan of the chest in 6 months. For the hypothyroidism, she is currently on levothyroxine 88 mcg p.o. daily and this is monitored by her primary care provider. She was advised to call immediately if she has any other concerning symptoms in the interval.  All questions were answered. The patient knows to call the clinic with any problems, questions or concerns. We can certainly see the patient much sooner if necessary.  Disclaimer: This note was dictated with voice recognition software. Similar sounding words can inadvertently be transcribed and may not be corrected upon review.

## 2022-10-17 ENCOUNTER — Other Ambulatory Visit: Payer: Self-pay | Admitting: Medical Oncology

## 2022-10-17 NOTE — Progress Notes (Signed)
This encounter was created in error - please disregard.

## 2022-10-18 ENCOUNTER — Inpatient Hospital Stay: Payer: 59

## 2022-10-18 ENCOUNTER — Inpatient Hospital Stay: Payer: 59 | Attending: Internal Medicine | Admitting: Internal Medicine

## 2022-10-18 ENCOUNTER — Ambulatory Visit (HOSPITAL_COMMUNITY)
Admission: RE | Admit: 2022-10-18 | Discharge: 2022-10-18 | Disposition: A | Discharge status: Home / Self Care | Payer: 59 | Source: Ambulatory Visit | Attending: Internal Medicine | Admitting: Internal Medicine

## 2022-10-18 VITALS — BP 119/52 | HR 62 | Temp 97.3°F | Resp 15 | Wt 136.1 lb

## 2022-10-18 DIAGNOSIS — Z79899 Other long term (current) drug therapy: Secondary | ICD-10-CM | POA: Insufficient documentation

## 2022-10-18 DIAGNOSIS — C3490 Malignant neoplasm of unspecified part of unspecified bronchus or lung: Secondary | ICD-10-CM | POA: Diagnosis present

## 2022-10-18 DIAGNOSIS — C3432 Malignant neoplasm of lower lobe, left bronchus or lung: Secondary | ICD-10-CM | POA: Diagnosis present

## 2022-10-18 DIAGNOSIS — E039 Hypothyroidism, unspecified: Secondary | ICD-10-CM | POA: Diagnosis not present

## 2022-10-18 MED ORDER — IOHEXOL 300 MG/ML  SOLN
75.0000 mL | Freq: Once | INTRAMUSCULAR | Status: AC | PRN
  Administered 2022-10-18: 75 mL via INTRAVENOUS

## 2022-10-18 NOTE — Addendum Note (Signed)
Addended by: Princella Ion on: 10/18/2022 11:45 AM   Modules accepted: Orders

## 2022-10-18 NOTE — Progress Notes (Signed)
[facility] [phone]) [phone]   Fax:(336) [phone]  OFFICE PROGRESS NOTE  Erskine Emery, NP [address] [place] [address]  DIAGNOSIS: Recurrent non-small cell lung cancer, adenocarcinoma with PDL 1 expression of 60% diagnosed in November 2016. The patient was initially diagnosed as stage IB (T2a, N0, M0) adenocarcinoma in March 2016.  PRIOR THERAPY:   1) Status post left video-assisted thoracoscopy, wedge resection left lower lobe, thoracoscopic left lower lobectomy and mediastinal lymph node dissection on 08/14/2014.  2) Immunotherapy with Ketruda (pembrolizumab) 200 mg IV every 3 weeks status post 35 cycles. First dose was given 05/12/2015.  This was discontinued after the patient completed 2 years of treatment.  CURRENT THERAPY: Observation.   INTERVAL HISTORY: Sharon [name] 62 y.o. female returns to the clinic today for 6 months follow-up visit.  The patient is feeling fine today with no concerning complaints.  She denied having any current chest pain, shortness of breath, cough or hemoptysis.  She has no nausea, vomiting, diarrhea or constipation.  She has no headache or visual changes.  She denied having any fever or chills.  She is here today for evaluation and repeat CT scan of the chest for restaging of her disease.  MEDICAL HISTORY: Past Medical History:  Diagnosis Date   Aortic insufficiency    Echo 8/09:  EF 55-60, impaired relaxation, mild aortic stenosis (mean 8.1), moderate aortic insufficiency, trace MR, trace TR // Echo 4/18: mild LVH, EF 55-60, no RWMA, Gr 1 DD, mod AI // Echo 5/19: EF 55-60, normal diastolic function, mild aortic stenosis (mean 16, peak 30), moderate aortic insufficiency    Arthritis    Carotid artery disease (HCC) 03/09/2011   Carotid US 8/15 - R 40-59; L1-39 >> follow-up 1 year // Carotid US 4/18: ICA 1-39 bilaterally - repeat 1 yr// Carotid US 5/19: Bilateral ICA 1-39   COPD (chronic obstructive pulmonary disease)  (HCC)    Coronary artery disease involving native coronary artery of native heart without angina pectoris 11/24/2009   s/p ant STEMI 5/09 >> LHC 5/09 - LM normal; LAD proximal 99; branching DX with one branch occluded and the other severely diseased; LCx okay with irregularities in the OM1; RCA proximal occluded-CTO; EF 30-40, anterior wall HK  >>  PCI: 3 x 18 mm Promus DES, 2.5 x 15 mm Promus DES to the LAD   Emphysema of lung (HCC)    Headache 09/06/2016   History of acute anterior wall MI 12/2007   s/p DES x 2 to LAD   Hyperlipidemia    Ischemic cardiomyopathy    Non-small cell lung cancer ([facility]) dx'd 06/2014   per CT CHEST/PET 2/4 and 07/18/14 @ [facility]   Pneumonia    2015   HX BRONCHITIS     Tobacco user     ALLERGIES:  is allergic to codeine, lipitor [atorvastatin calcium], morphine and codeine, oxycodone-acetaminophen, and metoprolol.  MEDICATIONS:  Current Outpatient Medications  Medication Sig Dispense Refill   acetaminophen (TYLENOL) 500 MG tablet Take 1,000 mg by mouth every 6 (six) hours as needed for moderate pain or headache.     Albuterol Sulfate (PROAIR RESPICLICK) 108 (90 Base) MCG/ACT AEPB Inhale into the lungs.     aspirin (ASPIR-LOW) 81 MG EC tablet Take 81 mg by mouth daily.      cholecalciferol (VITAMIN D3) 25 MCG (1000 UT) tablet Take 2,000 Units by mouth daily. (Patient not taking: Reported on 11/17/2020)     cyclobenzaprine (  FLEXERIL) 10 MG tablet Take 10 mg by mouth 2 (two) times daily.     ezetimibe (ZETIA) 10 MG tablet TAKE 1 TABLET BY MOUTH DAILY 90 tablet 3   levothyroxine (SYNTHROID, LEVOTHROID) 88 MCG tablet Take 88 mcg by mouth daily before breakfast.     nitroGLYCERIN (NITROSTAT) 0.4 MG SL tablet Place under the tongue.     rosuvastatin (CRESTOR) 40 MG tablet Take 1 tablet (40 mg total) by mouth daily. 90 tablet 3   No current facility-administered medications for this visit.    SURGICAL HISTORY:  Past Surgical History:  Procedure Laterality Date    CARDIAC CATHETERIZATION  10/15/2007   EF 30-40%   CESAREAN SECTION     CHOLECYSTECTOMY     CORONARY ANGIOPLASTY WITH STENT PLACEMENT     LAD   ectopic pregnancies requiring laparotomies     IR KYPHO THORACIC WITH BONE BIOPSY  08/14/2018   LEAD REMOVAL Left 08/14/2014   Procedure: CRYO INTERCOSTAL NERVE BLOCK;  Surgeon: Loreli Slot, MD;  Location: [facility];  Service: Thoracic;  Laterality: Left;   LOBECTOMY Left 08/14/2014   Procedure: LEFT LOWER LOBECTOMY;  Surgeon: Loreli Slot, MD;  Location[facility];  Service: Thoracic;  Laterality: Left;   NODE DISSECTION Left 08/14/2014   Procedure: NODE DISSECTION, LEFT LOWER LOBE LUNG;  Surgeon: Loreli Slot, MD;  Location: [facility] OR;  Service: Thoracic;  Laterality: Left;   SHOULDER SURGERY     LEFT X 2   (REMOVED SOME COLLAR BONE )   TRANSTHORACIC ECHOCARDIOGRAM  10/17/2007   TUBAL LIGATION     US ECHOCARDIOGRAPHY  01/07/2008   EF 55-60%   VIDEO ASSISTED THORACOSCOPY (VATS)/WEDGE RESECTION Left 08/14/2014   Procedure: VIDEO ASSISTED THORACOSCOPY (VATS)/WEDGE RESECTION;  Surgeon: Loreli Slot, MD;  Location: [facility] OR;  Service: Thoracic;  Laterality: Left;    REVIEW OF SYSTEMS:  A comprehensive review of systems was negative.   PHYSICAL EXAMINATION: General appearance: alert, cooperative, and no distress Head: Normocephalic, without obvious abnormality, atraumatic Neck: no adenopathy, no JVD, supple, symmetrical, trachea midline, and thyroid not enlarged, symmetric, no tenderness/mass/nodules Lymph nodes: Cervical, supraclavicular, and axillary nodes normal. Resp: clear to auscultation bilaterally Back: negative Cardio: regular rate and rhythm, S1, S2 normal, no murmur, click, rub or gallop GI: soft, non-tender; bowel sounds normal; no masses,  no organomegaly Extremities: extremities normal, atraumatic, no cyanosis or edema  ECOG PERFORMANCE STATUS: 1 - Symptomatic but completely ambulatory  Blood pressure (!) 119/52, pulse  62, temperature (!) 97.3 F (36.3 C), temperature source Temporal, resp. rate 15, weight 136 lb 1.6 oz (61.7 kg), SpO2 95 %.  LABORATORY DATA: Lab Results  Component Value Date   WBC 6.9 05/17/2022   HGB 14.9 05/17/2022   HCT 43.1 05/17/2022   MCV 98.4 05/17/2022   PLT 179 05/17/2022      Chemistry      Component Value Date/Time   NA 140 05/17/2022 0814   NA 139 05/25/2017 1344   K 4.3 05/17/2022 0814   K 3.5 05/25/2017 1344   CL 110 05/17/2022 0814   CO2 24 05/17/2022 0814   CO2 23 05/25/2017 1344   BUN 15 05/17/2022 0814   BUN 10.0 05/25/2017 1344   CREATININE 0.81 05/17/2022 0814   CREATININE 0.7 05/25/2017 1344      Component Value Date/Time   CALCIUM 9.6 05/17/2022 0814   CALCIUM 9.4 05/25/2017 1344   ALKPHOS 65 05/17/2022 0814   ALKPHOS 94 05/25/2017 1344   AST  29 05/17/2022 0814   AST 23 05/25/2017 1344   ALT 20 05/17/2022 0814   ALT 20 05/25/2017 1344   BILITOT 0.5 05/17/2022 0814   BILITOT 0.38 05/25/2017 1344       RADIOGRAPHIC STUDIES: CT Chest W Contrast  Result Date: 10/18/2022 CLINICAL DATA:  Follow-up non-small cell lung cancer EXAM: CT CHEST WITH CONTRAST TECHNIQUE: Multidetector CT imaging of the chest was performed during intravenous contrast administration. RADIATION DOSE REDUCTION: This exam was performed according to the departmental dose-optimization program which includes automated exposure control, adjustment of the mA and/or kV according to patient size and/or use of iterative reconstruction technique. CONTRAST:  75mL OMNIPAQUE IOHEXOL 300 MG/ML  SOLN COMPARISON:  05/07/2022 FINDINGS: Cardiovascular: The heart is normal in size. No pericardial effusion. No evidence of thoracic aortic aneurysm. Atherosclerotic calcifications of the aortic arch. Severe three-vessel coronary atherosclerosis. Mediastinum/Nodes: Small mediastinal lymph nodes, including a dominant 8 mm short axis prevascular node (series 2/image 7), unchanged. Visualized thyroid is  unremarkable. Lungs/Pleura: Moderate centrilobular emphysematous changes, upper lung predominant. Status post left lower lobectomy. Stable linear/bandlike scarring in the posterior left upper lobe (series 6/image 112), benign. Stable flat subpleural nodule in the posterior right lower lobe (series 6/image 83), benign. Additional 3 mm subpleural nodules in the anterior right upper lobe (series 6/images 43 and 46), benign. No suspicious pulmonary nodules. No focal consolidation. No pleural effusion or pneumothorax. Upper Abdomen: Visualized upper abdomen is notable for prior cholecystectomy and vascular calcifications. Musculoskeletal: Prior vertebral augmentation at T9 and T12. IMPRESSION: Status post left lower lobectomy. No evidence of recurrent or metastatic disease. Additional stable ancillary findings as above. Aortic Atherosclerosis (ICD10-I70.0) and Emphysema (ICD10-J43.9). Electronically Signed   By: Charline Bills M.D.   On: 10/18/2022 10:00     ASSESSMENT AND PLAN:  This is a very pleasant 62 years old white female with recurrent non-small cell lung cancer, adenocarcinoma with positive PDL 1 expression of 60%. The patient completed treatment with immunotherapy with Ketruda 200 mg IV every 3 weeks status post 35 cycles. The patient has been on observation for the last few years and doing fine with no concerning complaints. She had repeat CT scan of the chest performed at recently.  I personally and independently reviewed the scan and discussed the result with the patient today. Her scan showed no concerning findings for disease recurrence or metastasis. I recommended for her to continue on observation with repeat CT scan of the chest in 6 months. For the hypothyroidism, she is currently on levothyroxine 88 mcg p.o. daily and this is monitored by her primary care provider. The patient was advised to call immediately if she has any other concerning symptoms in the interval. All questions were  answered. The patient knows to call the clinic with any problems, questions or concerns. We can certainly see the patient much sooner if necessary.  Disclaimer: This note was dictated with voice recognition software. Similar sounding words can inadvertently be transcribed and may not be corrected upon review.

## 2023-04-17 ENCOUNTER — Inpatient Hospital Stay: Payer: 59 | Attending: Internal Medicine

## 2023-04-17 ENCOUNTER — Other Ambulatory Visit: Payer: Self-pay

## 2023-04-17 ENCOUNTER — Ambulatory Visit (HOSPITAL_COMMUNITY)
Admission: RE | Admit: 2023-04-17 | Discharge: 2023-04-17 | Disposition: A | Discharge status: Home / Self Care | Payer: 59 | Source: Ambulatory Visit | Attending: Internal Medicine | Admitting: Internal Medicine

## 2023-04-17 DIAGNOSIS — C3490 Malignant neoplasm of unspecified part of unspecified bronchus or lung: Secondary | ICD-10-CM | POA: Insufficient documentation

## 2023-04-17 DIAGNOSIS — Z79899 Other long term (current) drug therapy: Secondary | ICD-10-CM | POA: Insufficient documentation

## 2023-04-17 DIAGNOSIS — Z85118 Personal history of other malignant neoplasm of bronchus and lung: Secondary | ICD-10-CM | POA: Insufficient documentation

## 2023-04-17 DIAGNOSIS — E039 Hypothyroidism, unspecified: Secondary | ICD-10-CM | POA: Insufficient documentation

## 2023-04-17 LAB — CMP (CANCER CENTER ONLY)
ALT: 19 U/L (ref 0–44)
AST: 26 U/L (ref 15–41)
Albumin: 4.1 g/dL (ref 3.5–5.0)
Alkaline Phosphatase: 51 U/L (ref 38–126)
Anion gap: 6 (ref 5–15)
BUN: 10 mg/dL (ref 8–23)
CO2: 28 mmol/L (ref 22–32)
Calcium: 9.6 mg/dL (ref 8.9–10.3)
Chloride: 105 mmol/L (ref 98–111)
Creatinine: 0.79 mg/dL (ref 0.44–1.00)
GFR, Estimated: >60 mL/min (ref >60)
Glucose, Bld: 97 mg/dL (ref 70–99)
Potassium: 3.9 mmol/L (ref 3.5–5.1)
Sodium: 139 mmol/L (ref 135–145)
Total Bilirubin: 0.7 mg/dL (ref <1.2)
Total Protein: 7.3 g/dL (ref 6.5–8.1)

## 2023-04-17 LAB — CBC WITH DIFFERENTIAL (CANCER CENTER ONLY)
Abs Immature Granulocytes: 0.01 10*3/uL (ref 0.00–0.07)
Basophils Absolute: 0 10*3/uL (ref 0.0–0.1)
Basophils Relative: 1 %
Eosinophils Absolute: 0.1 10*3/uL (ref 0.0–0.5)
Eosinophils Relative: 1 %
HCT: 45.1 % (ref 36.0–46.0)
Hemoglobin: 15 g/dL (ref 12.0–15.0)
Immature Granulocytes: 0 %
Lymphocytes Relative: 39 %
Lymphs Abs: 2.5 10*3/uL (ref 0.7–4.0)
MCH: 33.5 pg (ref 26.0–34.0)
MCHC: 33.3 g/dL (ref 30.0–36.0)
MCV: 100.7 fL — ABNORMAL HIGH (ref 80.0–100.0)
Monocytes Absolute: 0.5 10*3/uL (ref 0.1–1.0)
Monocytes Relative: 8 %
Neutro Abs: 3.2 10*3/uL (ref 1.7–7.7)
Neutrophils Relative %: 51 %
Platelet Count: 185 10*3/uL (ref 150–400)
RBC: 4.48 MIL/uL (ref 3.87–5.11)
RDW: 13.7 % (ref 11.5–15.5)
WBC Count: 6.3 10*3/uL (ref 4.0–10.5)
nRBC: 0 % (ref 0.0–0.2)

## 2023-04-17 MED ORDER — IOHEXOL 300 MG/ML  SOLN
75.0000 mL | Freq: Once | INTRAMUSCULAR | Status: AC | PRN
  Administered 2023-04-17: 75 mL via INTRAVENOUS

## 2023-04-19 ENCOUNTER — Inpatient Hospital Stay (HOSPITAL_BASED_OUTPATIENT_CLINIC_OR_DEPARTMENT_OTHER): Payer: 59 | Admitting: Internal Medicine

## 2023-04-19 ENCOUNTER — Ambulatory Visit: Payer: 59 | Admitting: Internal Medicine

## 2023-04-19 VITALS — BP 127/66 | HR 70 | Temp 98.2°F | Resp 15 | Ht 64.0 in | Wt 138.0 lb

## 2023-04-19 DIAGNOSIS — C3490 Malignant neoplasm of unspecified part of unspecified bronchus or lung: Secondary | ICD-10-CM

## 2023-04-19 DIAGNOSIS — E039 Hypothyroidism, unspecified: Secondary | ICD-10-CM | POA: Diagnosis not present

## 2023-04-19 DIAGNOSIS — Z85118 Personal history of other malignant neoplasm of bronchus and lung: Secondary | ICD-10-CM | POA: Diagnosis present

## 2023-04-19 DIAGNOSIS — Z79899 Other long term (current) drug therapy: Secondary | ICD-10-CM | POA: Diagnosis not present

## 2023-04-19 NOTE — Progress Notes (Signed)
[facility] [phone]   [phone]  OFFICE PROGRESS NOTE  Erskine Emery, NP [address] [place] [address]  DIAGNOSIS: Recurrent non-small cell lung cancer, adenocarcinoma with PDL 1 expression of 60% diagnosed in November 2016. The patient was initially diagnosed as stage IB (T2a, N0, M0) adenocarcinoma in March 2016.  PRIOR THERAPY:   1) Status post left video-assisted thoracoscopy, wedge resection left lower lobe, thoracoscopic left lower lobectomy and mediastinal lymph node dissection on 08/14/2014.  2) Immunotherapy with Ketruda (pembrolizumab) 200 mg IV every 3 weeks status post 35 cycles. First dose was given 05/12/2015.  This was discontinued after the patient completed 2 years of treatment.  CURRENT THERAPY: Observation.   INTERVAL HISTORY: Sharon [name] 62 y.o. female returns to the clinic today for 44-month follow-up visit.Discussed the use of AI scribe software for clinical note transcription with the patient, who gave verbal consent to proceed.  History of Present Illness   Sharon [name], a 62 year old patient with a history of lung cancer, underwent a lobectomy in March 2016. The cancer recurred in November of the same year, at which point treatment with Sharon [name] was initiated. The last dose of Sharon [name] was administered in November 2018, and the patient has been off treatment for approximately six years.  The patient reports feeling generally well, with no new health complaints. However, she continues to experience shortness of breath with activity, a symptom that has persisted over time. Occasional nausea is also reported, but no chest pain, hemoptysis, or recent weight loss.  The patient also has a history of hypothyroidism, which is being managed with levothyroxine. The dosage was recently increased from 50mg  to 88mg .  Despite the long-term absence of cancer treatment, the patient's scans continue to show no evidence of  cancer growth or spread. The patient feels confident in her ability to recognize if her condition were to deteriorate, citing changes in appetite as a potential indicator.       MEDICAL HISTORY: Past Medical History:  Diagnosis Date   Aortic insufficiency    Echo 8/09:  EF 55-60, impaired relaxation, mild aortic stenosis (mean 8.1), moderate aortic insufficiency, trace MR, trace TR // Echo 4/18: mild LVH, EF 55-60, no RWMA, Gr 1 DD, mod AI // Echo 5/19: EF 55-60, normal diastolic function, mild aortic stenosis (mean 16, peak 30), moderate aortic insufficiency    Arthritis    Carotid artery disease (HCC) 03/09/2011   Carotid US 8/15 - R 40-59; L1-39 >> follow-up 1 year // Carotid US 4/18: ICA 1-39 bilaterally - repeat 1 yr// Carotid US 5/19: Bilateral ICA 1-39   COPD (chronic obstructive pulmonary disease) (HCC)    Coronary artery disease involving native coronary artery of native heart without angina pectoris 11/24/2009   s/p ant STEMI 5/09 >> LHC 5/09 - LM normal; LAD proximal 99; branching DX with one branch occluded and the other severely diseased; LCx okay with irregularities in the OM1; RCA proximal occluded-CTO; EF 30-40, anterior wall HK  >>  PCI: 3 x 18 mm Promus DES, 2.5 x 15 mm Promus DES to the LAD   Emphysema of lung (HCC)    Headache 09/06/2016   History of acute anterior wall MI 12/2007   s/p DES x 2 to LAD   Hyperlipidemia    Ischemic cardiomyopathy    Non-small cell lung cancer (HCC) dx'd 06/2014   per CT CHEST/PET 2/4 and 07/18/14 @ [facility]   Pneumonia  2015   HX BRONCHITIS     Tobacco user     ALLERGIES:  is allergic to codeine, lipitor [atorvastatin calcium], morphine and codeine, oxycodone-acetaminophen, and metoprolol.  MEDICATIONS:  Current Outpatient Medications  Medication Sig Dispense Refill   acetaminophen (TYLENOL) 500 MG tablet Take 1,000 mg by mouth every 6 (six) hours as needed for moderate pain or headache.     Albuterol Sulfate (PROAIR RESPICLICK) 108  (90 Base) MCG/ACT AEPB Inhale into the lungs.     aspirin (ASPIR-LOW) 81 MG EC tablet Take 81 mg by mouth daily.      cetirizine (ZYRTEC) 10 MG tablet Take 10 mg by mouth daily.     cholecalciferol (VITAMIN D3) 25 MCG (1000 UT) tablet Take 2,000 Units by mouth daily. (Patient not taking: Reported on 11/17/2020)     clopidogrel (PLAVIX) 75 MG tablet Take 75 mg by mouth daily.     escitalopram (LEXAPRO) 10 MG tablet Take 10 mg by mouth at bedtime.     ezetimibe (ZETIA) 10 MG tablet TAKE 1 TABLET BY MOUTH DAILY 90 tablet 3   gabapentin (NEURONTIN) 100 MG capsule Take 100 mg by mouth 3 (three) times daily.     levothyroxine (SYNTHROID) 50 MCG tablet Take 1 tablet by mouth daily before breakfast.     nitroGLYCERIN (NITROSTAT) 0.4 MG SL tablet Place under the tongue.     rosuvastatin (CRESTOR) 40 MG tablet Take 1 tablet (40 mg total) by mouth daily. 90 tablet 3   No current facility-administered medications for this visit.    SURGICAL HISTORY:  Past Surgical History:  Procedure Laterality Date   CARDIAC CATHETERIZATION  10/15/2007   EF 30-40%   CESAREAN SECTION     CHOLECYSTECTOMY     CORONARY ANGIOPLASTY WITH STENT PLACEMENT     LAD   ectopic pregnancies requiring laparotomies     IR KYPHO THORACIC WITH BONE BIOPSY  08/14/2018   LEAD REMOVAL Left 08/14/2014   Procedure: CRYO INTERCOSTAL NERVE BLOCK;  Surgeon: Loreli Slot, MD;  Location: [facility] OR;  Service: Thoracic;  Laterality: Left;   LOBECTOMY Left 08/14/2014   Procedure: LEFT LOWER LOBECTOMY;  Surgeon: Loreli Slot, MD;  Location: [facility] OR;  Service: Thoracic;  Laterality: Left;   NODE DISSECTION Left 08/14/2014   Procedure: NODE DISSECTION, LEFT LOWER LOBE LUNG;  Surgeon: Loreli Slot, MD;  Location: [facility] OR;  Service: Thoracic;  Laterality: Left;   SHOULDER SURGERY     LEFT X 2   (REMOVED SOME COLLAR BONE )   TRANSTHORACIC ECHOCARDIOGRAM  10/17/2007   TUBAL LIGATION     US ECHOCARDIOGRAPHY  01/07/2008   EF 55-60%    VIDEO ASSISTED THORACOSCOPY (VATS)/WEDGE RESECTION Left 08/14/2014   Procedure: VIDEO ASSISTED THORACOSCOPY (VATS)/WEDGE RESECTION;  Surgeon: Loreli Slot, MD;  Location: [facility] OR;  Service: Thoracic;  Laterality: Left;    REVIEW OF SYSTEMS:  A comprehensive review of systems was negative except for: Constitutional: positive for fatigue Respiratory: positive for dyspnea on exertion   PHYSICAL EXAMINATION: General appearance: alert, cooperative, and no distress Head: Normocephalic, without obvious abnormality, atraumatic Neck: no adenopathy, no JVD, supple, symmetrical, trachea midline, and thyroid not enlarged, symmetric, no tenderness/mass/nodules Lymph nodes: Cervical, supraclavicular, and axillary nodes normal. Resp: clear to auscultation bilaterally Back: negative Cardio: regular rate and rhythm, S1, S2 normal, no murmur, click, rub or gallop GI: soft, non-tender; bowel sounds normal; no masses,  no organomegaly Extremities: extremities normal, atraumatic, no cyanosis or edema  ECOG PERFORMANCE STATUS:  1 - Symptomatic but completely ambulatory  Blood pressure 127/66, pulse 70, temperature 98.2 F (36.8 C), temperature source Temporal, resp. rate 15, height 5\' 4"  (1.626 m), weight 138 lb (62.6 kg), SpO2 99%.  LABORATORY DATA: Lab Results  Component Value Date   WBC 6.3 04/17/2023   HGB 15.0 04/17/2023   HCT 45.1 04/17/2023   MCV 100.7 (H) 04/17/2023   PLT 185 04/17/2023      Chemistry      Component Value Date/Time   NA 139 04/17/2023 0956   NA 139 05/25/2017 1344   K 3.9 04/17/2023 0956   K 3.5 05/25/2017 1344   CL 105 04/17/2023 0956   CO2 28 04/17/2023 0956   CO2 23 05/25/2017 1344   BUN 10 04/17/2023 0956   BUN 10.0 05/25/2017 1344   CREATININE 0.79 04/17/2023 0956   CREATININE 0.7 05/25/2017 1344      Component Value Date/Time   CALCIUM 9.6 04/17/2023 0956   CALCIUM 9.4 05/25/2017 1344   ALKPHOS 51 04/17/2023 0956   ALKPHOS 94 05/25/2017 1344   AST 26  04/17/2023 0956   AST 23 05/25/2017 1344   ALT 19 04/17/2023 0956   ALT 20 05/25/2017 1344   BILITOT 0.7 04/17/2023 0956   BILITOT 0.38 05/25/2017 1344       RADIOGRAPHIC STUDIES: CT Chest W Contrast  Result Date: 04/19/2023 CLINICAL DATA:  Non-small cell lung cancer. Assess treatment response. * Tracking Code: BO * EXAM: CT CHEST WITH CONTRAST TECHNIQUE: Multidetector CT imaging of the chest was performed during intravenous contrast administration. RADIATION DOSE REDUCTION: This exam was performed according to the departmental dose-optimization program which includes automated exposure control, adjustment of the mA and/or kV according to patient size and/or use of iterative reconstruction technique. CONTRAST:  75mL OMNIPAQUE IOHEXOL 300 MG/ML  SOLN COMPARISON:  Chest CT 10/18/2022 FINDINGS: Cardiovascular: No significant vascular findings. Normal heart size. No pericardial effusion. Mediastinum/Nodes: No axillary or supraclavicular adenopathy. No mediastinal or hilar adenopathy. No pericardial fluid. Esophagus normal. Small RIGHT paratracheal nodes measuring 10 mm (image 29/2) compares to 11 mm. Prevascular node measures 7 mm (image 57/2 also unchanged. Small RIGHT hilar lymph node measures 8 mm unchanged. Lungs/Pleura: Centrilobular emphysema the upper lobes. Surgical staple line in the LEFT upper lobe without evidence of nodularity. Band of pleural-parenchymal thickening in the inferior LEFT lower lobe (image 112 is unchanged. Tiny RIGHT upper lobe nodule measuring 3 mm image 44/5 is unchanged. Upper Limited view of the liver, kidneys, pancreas are unremarkable. Normal adrenal glands. Abdomen: Limited view of the liver, kidneys, pancreas are unremarkable. Normal adrenal glands. Musculoskeletal: No aggressive osseous lesion. Vertebroplasty cement in the lower thoracic spine. IMPRESSION: 1. Stable postoperative changes in the LEFT upper lobe. No evidence of local recurrence. 2. Stable small  mediastinal and RIGHT hilar lymph nodes. 3. Stable tiny RIGHT upper lobe nodule. 4.  Emphysema (ICD10-J43.9). Electronically Signed   By: Genevive Bi M.D.   On: 04/19/2023 12:28     ASSESSMENT AND PLAN:  This is a very pleasant 62 years old white female with recurrent non-small cell lung cancer, adenocarcinoma with positive PDL 1 expression of 60%. The patient completed treatment with immunotherapy with Ketruda 200 mg IV every 3 weeks status post 35 cycles. The patient has been in observation for the last 6 years and she is feeling fine. She had repeat CT scan of the chest performed recently.  I personally and independently reviewed the scan and discussed the result with the patient today.  Her scan showed no evidence for local recurrence or metastasis.    Recurrent non-small cell lung cancer, adenocarcinoma with PDL 1 expression of 60% diagnosed in November 2016. Treated with Keytruda for two years, last dose in November 2018. No evidence of cancer growth or metastasis on recent scans. Reports feeling well with occasional nausea and exertional dyspnea. Considering annual follow-up visits due to stable condition for six years. - Continue annual follow-up visits - Monitor for new symptoms or health changes  Hypothyroidism Managed with levothyroxine, current dose 88 mcg (increased from 50 mcg). Follow-up with primary care provider in December. - Continue levothyroxine 88 mcg - Follow up with primary care provider in December  General Health Maintenance Advised to resolve medication discrepancies with family doctor and update MyChart. - Resolve medication discrepancies with family doctor - Update MyChart with current medication information  Follow-up - Schedule follow-up visit in one year.   He was advised to call immediately if she has any other concerning symptoms in the interval.  All questions were answered. The patient knows to call the clinic with any problems, questions or  concerns. We can certainly see the patient much sooner if necessary.  Disclaimer: This note was dictated with voice recognition software. Similar sounding words can inadvertently be transcribed and may not be corrected upon review.

## 2023-05-12 ENCOUNTER — Other Ambulatory Visit: Payer: Self-pay | Admitting: Family

## 2023-05-12 ENCOUNTER — Ambulatory Visit
Admission: RE | Admit: 2023-05-12 | Discharge: 2023-05-12 | Disposition: A | Discharge status: Home / Self Care | Payer: 59 | Source: Ambulatory Visit | Attending: Family | Admitting: Family

## 2023-05-12 DIAGNOSIS — Z1231 Encounter for screening mammogram for malignant neoplasm of breast: Secondary | ICD-10-CM

## 2024-02-23 ENCOUNTER — Ambulatory Visit: Attending: Internal Medicine | Admitting: Internal Medicine

## 2024-02-23 VITALS — BP 128/60 | HR 60 | Ht 64.0 in | Wt 136.0 lb

## 2024-02-23 DIAGNOSIS — C3432 Malignant neoplasm of lower lobe, left bronchus or lung: Secondary | ICD-10-CM

## 2024-02-23 DIAGNOSIS — I350 Nonrheumatic aortic (valve) stenosis: Secondary | ICD-10-CM

## 2024-02-23 DIAGNOSIS — R400 Somnolence: Secondary | ICD-10-CM

## 2024-02-23 DIAGNOSIS — R0683 Snoring: Secondary | ICD-10-CM

## 2024-02-23 DIAGNOSIS — I351 Nonrheumatic aortic (valve) insufficiency: Secondary | ICD-10-CM

## 2024-02-23 DIAGNOSIS — E785 Hyperlipidemia, unspecified: Secondary | ICD-10-CM

## 2024-02-23 DIAGNOSIS — I2510 Atherosclerotic heart disease of native coronary artery without angina pectoris: Secondary | ICD-10-CM | POA: Diagnosis not present

## 2024-02-23 DIAGNOSIS — I5032 Chronic diastolic (congestive) heart failure: Secondary | ICD-10-CM

## 2024-02-23 DIAGNOSIS — E039 Hypothyroidism, unspecified: Secondary | ICD-10-CM

## 2024-02-23 DIAGNOSIS — F17200 Nicotine dependence, unspecified, uncomplicated: Secondary | ICD-10-CM

## 2024-02-23 NOTE — Patient Instructions (Signed)
  Testing/Procedures: [facility]  Your physician has recommended that you have a sleep study. This test records several body functions during sleep, including: brain activity, eye movement, oxygen and carbon dioxide blood levels, heart rate and rhythm, breathing rate and rhythm, the flow of air through your mouth and nose, snoring, body muscle movements, and chest and belly movement.  ECHOCARDIOGRAM  Your physician has requested that you have an echocardiogram. Echocardiography is a painless test that uses sound waves to create images of your heart. It provides your doctor with information about the size and shape of your heart and how well your heart's chambers and valves are working. This procedure takes approximately one hour. There are no restrictions for this procedure. Please do NOT wear cologne, perfume, aftershave, or lotions (deodorant is allowed). Please arrive 15 minutes prior to your appointment time.  Please note: We ask at that you not bring children with you during ultrasound (echo/ vascular) testing. Due to room size and safety concerns, children are not allowed in the ultrasound rooms during exams. Our front office staff cannot provide observation of children in our lobby area while testing is being conducted. An adult accompanying a patient to their appointment will only be allowed in the ultrasound room at the discretion of the ultrasound technician under special circumstances. We apologize for any inconvenience.   Follow-Up: At [facility], you and your health needs are our priority.  As part of our continuing mission to provide you with exceptional heart care, our providers are all part of one team.  This team includes your primary Cardiologist (physician) and Advanced Practice Providers or APPs (Physician Assistants and Nurse Practitioners) who all work together to provide you with the care you need, when you need it.  Your next appointment:   4 week(s)  Provider:    Gayatri A Acharya, MD

## 2024-02-23 NOTE — Progress Notes (Signed)
 Cardiology Office Note:  .   Date:  02/23/2024  ID:  Sharon [name], DOB [birth year], MRN [id number] PCP: Silvano Angeline FALCON, NP  [facility] Providers Cardiologist:  Soyla DELENA Merck, MD    History of Present Illness: .   Sharon [name] is a 63 y.o. female.  Discussed the use of AI scribe software for clinical note transcription with the patient, who gave verbal consent to proceed.  History of Present Illness Sharon [name] is a 63 year old female with aortic stenosis and coronary artery disease who presents with shortness of breath. She is transferring her care to my practice, since I also take care of her mother Sharon [name].  She has coronary artery disease with a history of anterior STEMI myocardial infarction in 2009, leading to the placement of two overlapping stents in the mid LAD. There is a chronic total occlusion of the right coronary artery. She had ischemic cardiomyopathy with recovered EF.  She experiences shortness of breath during activities such as shopping, loading groceries, and sweeping, but does not frequently experience chest pain. She does not have to stop her activities due to shortness of breath but will sit down if she feels any discomfort in her heart, which occurs infrequently.  She has moderate aortic stenosis and mod-severe aortic regurgitation on echocardiogram performed in May 2025 at an outside facility. She reports significant shortness of breath.  Her current medications include Ranexa 500 mg twice daily, levothyroxine  75 mcg daily, rosuvastatin , ezetimibe , aspirin , and Plavix . She reports no issues with bleeding. She is active, taking care of her grandchildren and an elderly gentleman, and maintains a busy lifestyle despite her symptoms.    ROS: negative except per HPI above.  Studies Reviewed: SABRA   EKG Interpretation Date/Time:  Friday February 23 2024 09:27:47 EDT Ventricular Rate:  60 PR Interval:  162 QRS Duration:  82 QT  Interval:  446 QTC Calculation: 446 R Axis:   -4  Text Interpretation: Normal sinus rhythm Septal infarct (cited on or before 09-Nov-2009) Confirmed by Merck Soyla ([id number]) on 02/23/2024 10:40:01 AM    Results RADIOLOGY CT scan: Left lower lobe resection for lung cancer, no recurrence  DIAGNOSTIC Echocardiogram: Aortic valve with moderate stenosis and moderate to severe regurgitation (09/2023) Stress test: Indeterminate findings (09/2022) Risk Assessment/Calculations:       Physical Exam:   VS:  BP 128/60 (BP Location: Left Arm, Patient Position: Sitting, Cuff Size: Normal)   Pulse 60   Ht 5' 4 (1.626 m)   Wt 136 lb (61.7 kg)   SpO2 93%   BMI 23.34 kg/m    Wt Readings from Last 3 Encounters:  02/23/24 136 lb (61.7 kg)  04/19/23 138 lb (62.6 kg)  10/18/22 136 lb 1.6 oz (61.7 kg)     Physical Exam GENERAL: Alert, cooperative, well developed, no acute distress HEENT: Normocephalic, normal oropharynx, moist mucous membranes CHEST: Clear to auscultation bilaterally, no wheezes, rhonchi, or crackles absent BS LLL CARDIOVASCULAR: Normal heart rate and rhythm, S1 and S2 normal without murmurs ABDOMEN: Soft, non-tender, non-distended, without organomegaly, normal bowel sounds EXTREMITIES: No cyanosis or edema NEUROLOGICAL: Cranial nerves grossly intact, moves all extremities without gross motor or sensory deficit   ASSESSMENT AND PLAN: .    Assessment and Plan Assessment & Plan Moderate Aortic valve stenosis and mod-severe regurgitation Moderate stenosis and moderate to severe regurgitation with exertional dyspnea. Intermittent angina, no syncope. Previous echocardiogram unavailable for review. - Order echocardiogram to assess stenosis and regurgitation. -  Consider TEE based on echocardiogram results. - Discuss potential valve repair or replacement post-echocardiogram.  Atherosclerotic heart disease of native coronary artery with prior myocardial infarction and chronic  total occlusion HFrecEF HLD History of anterior STEMI myocardial infarction in 2009 with stent in mid LAD. Chronic total occlusion of right coronary artery with collateral circulation. Dyspnea present, no significant angina or syncope. - Continue Ranexa 500 mg twice daily. - continue Asa 81 mg daily and plavix  75 mg daily. May be able to deescalate to plavix  monotherapy.  - continue crestor  40 mg daily, zetia  10 mg daily,   Suspected obstructive sleep apnea Daytime fatigue and snoring suggestive of obstructive sleep apnea. - Order home sleep test to evaluate for obstructive sleep apnea. 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       Soyla Merck, MD, FACC

## 2024-02-26 ENCOUNTER — Ambulatory Visit (HOSPITAL_COMMUNITY)
Admission: RE | Admit: 2024-02-26 | Discharge: 2024-02-26 | Disposition: A | Discharge status: Home / Self Care | Source: Ambulatory Visit | Attending: Cardiology | Admitting: Cardiology

## 2024-02-26 DIAGNOSIS — I351 Nonrheumatic aortic (valve) insufficiency: Secondary | ICD-10-CM | POA: Insufficient documentation

## 2024-02-26 DIAGNOSIS — I350 Nonrheumatic aortic (valve) stenosis: Secondary | ICD-10-CM | POA: Insufficient documentation

## 2024-02-27 LAB — ECHOCARDIOGRAM COMPLETE
AR max vel: 1.03 cm2
AV Area VTI: 1.04 cm2
AV Area mean vel: 0.97 cm2
AV Mean grad: 24.6 mmHg
AV Peak grad: 44.1 mmHg
Ao pk vel: 3.32 m/s
Area-P 1/2: 3.05 cm2
P 1/2 time: 548 ms
S' Lateral: 3.4 cm

## 2024-03-28 ENCOUNTER — Ambulatory Visit: Attending: Internal Medicine | Admitting: Internal Medicine

## 2024-03-28 VITALS — BP 159/75 | HR 67 | Ht 64.0 in | Wt 137.7 lb

## 2024-03-28 DIAGNOSIS — I351 Nonrheumatic aortic (valve) insufficiency: Secondary | ICD-10-CM | POA: Diagnosis not present

## 2024-03-28 DIAGNOSIS — R0609 Other forms of dyspnea: Secondary | ICD-10-CM

## 2024-03-28 DIAGNOSIS — I5020 Unspecified systolic (congestive) heart failure: Secondary | ICD-10-CM

## 2024-03-28 DIAGNOSIS — F17200 Nicotine dependence, unspecified, uncomplicated: Secondary | ICD-10-CM

## 2024-03-28 DIAGNOSIS — R0683 Snoring: Secondary | ICD-10-CM

## 2024-03-28 DIAGNOSIS — E785 Hyperlipidemia, unspecified: Secondary | ICD-10-CM

## 2024-03-28 DIAGNOSIS — I350 Nonrheumatic aortic (valve) stenosis: Secondary | ICD-10-CM | POA: Diagnosis not present

## 2024-03-28 DIAGNOSIS — I2510 Atherosclerotic heart disease of native coronary artery without angina pectoris: Secondary | ICD-10-CM

## 2024-03-28 NOTE — H&P (View-Only) (Signed)
 Cardiology Office Note:  .   Date:  03/28/2024  ID:  Sharon [name], DOB 1961-02-15, MRN [id number] PCP: Silvano Angeline FALCON, NP  [facility] Providers Cardiologist:  Soyla DELENA Merck, MD    History of Present Illness: .   Sharon [name] is a 63 y.o. female.  Discussed the use of AI scribe software for clinical note transcription with the patient, who gave verbal consent to proceed.  History of Present Illness Sharon [name] is a 63 year old female with aortic valve stenosis and coronary artery disease who presents with shortness of breath.  She experiences shortness of breath during activities like cleaning, sweeping, grocery shopping, and loading items. This is sometimes accompanied by coughing. She uses an inhaler every morning, which helps reduce her coughing, and also uses Flonase and allergy medication.  She has aortic valve stenosis and coronary artery disease, with a previous anterior STEMI in 2009. Two overlapping stents were placed in the mid LAD, and she has a chronic total occlusion of the RCA. A stress test in May of last year showed mild abnormalities and she did have chest discomfort with pharmacologic stress. She was on Ranexa, which helped somewhat but did not completely alleviate her shortness of breath. She had similar symptoms at the time of stress testing, and we discuss that this should serve as the ischemic eval in the workup of her dyspnea, but we will revisit if necessary with cath.   She has a smoking history, having smoked since age 63, and continues to smoke. Her oxygen levels have been around 93 to 95% since she started coughing, down from her usual 97 to 98%.    ROS: negative except per HPI above.  Studies Reviewed: .        Results DIAGNOSTIC Chemical stress test: Predominantly normal with minor abnormality and discomfort (09/2022) Risk Assessment/Calculations:       Physical Exam:   VS:  BP (!) 159/75   Pulse 67   Ht 5' 4 (1.626 m)    Wt 137 lb 11.2 oz (62.5 kg)   SpO2 95%   BMI 23.64 kg/m    Wt Readings from Last 3 Encounters:  03/28/24 137 lb 11.2 oz (62.5 kg)  02/23/24 136 lb (61.7 kg)  04/19/23 138 lb (62.6 kg)     Physical Exam GENERAL: Alert, cooperative, well developed, no acute distress. HEENT: Normocephalic, normal oropharynx, moist mucous membranes. CHEST: Clear to auscultation bilaterally, no wheezes, rhonchi, or crackles. CARDIOVASCULAR: Normal heart rate and rhythm, S1 and S2 normal, 2/6 SEM and HDM ABDOMEN: Soft, non-tender, non-distended, without organomegaly, normal bowel sounds. EXTREMITIES: No cyanosis or edema. NEUROLOGICAL: Cranial nerves grossly intact, moves all extremities without gross motor or sensory deficit.   ASSESSMENT AND PLAN: .    Assessment and Plan Assessment & Plan Aortic valve stenosis with regurgitation Moderate-severe stenosis with moderate-severe regurgitation causing symptoms, likely due to valve dysfunction. - Order cardiac MRI to assess valve leakage and LV volumes. - Schedule TEE on November 14th for further evaluation of aortic valve and prior stroke eval.   Coronary artery disease with prior anterior STEMI and stents HLD Coronary artery disease with prior STEMI and stents. Symptoms may relate to coronary artery disease. Previous stress test showed mild abnormalities. - Monitor symptoms and consider heart catheterization if symptoms worsen or if surgical intervention for valve is pursued. - Continue Ranexa 500 mg twice daily. - continue Asa 81 mg daily and plavix  75 mg daily. May be able to  deescalate to plavix  monotherapy.  - continue crestor  40 mg daily, zetia  10 mg daily,   Ischemic cardiomyopathy with recovered ejection fraction Ischemic cardiomyopathy with previously reduced ejection fraction, now recovered. - Evaluate cardiac function with cardiac MRI.  Shortness of breath and cough, multifactorial (cardiac and pulmonary) Symptoms likely multifactorial,  with cardiac and potential pulmonary contributions. - Refer to pulmonology for evaluation of pulmonary function and potential impact on symptoms. - Review inhaler use and consider optimization of pulmonary treatment.  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 - Schedule TEE on November 14th to evaluate for cardiac source of embolism.  Left lower lobe lung cancer, status post wedge resection Status post wedge resection in 2016, no current follow-up. - Refer to pulmonology for evaluation and follow-up.  Tobacco use disorder Current smoker with long history, contributing to pulmonary symptoms and health risk.  Obstructive sleep apnea (suspected) Suspected obstructive sleep apnea with symptoms of snoring. - Complete home sleep test to evaluate for obstructive sleep apnea.  Recording duration: 24 minutes      Peggye Poon, MD, FACC

## 2024-03-28 NOTE — Patient Instructions (Signed)
 Medication Instructions:  No Changes  Lab Work: Today: BMET and CBC (In preparation of Cardiac MRI and TEE)  Testing/Procedures:   Someone will call you to schedule your Cardiac MRI at the location below.  It will be at one of the below locations.  [facility] [address] [place], [place] [address] Please take advantage of the free valet parking available at the [facility] and [employer] (Entrance C).  Proceed to the [facility] Radiology Department (First Floor) for check-in.   OR   [facility] [address] [place], [place] [address] Please go to the [facility] and check-in with the desk attendant.   Magnetic resonance imaging (MRI) is a painless test that produces images of the inside of the body without using Xrays.  During an MRI, strong magnets and radio waves work together in a data processing manager to form detailed images.   MRI images may provide more details about a medical condition than X-rays, CT scans, and ultrasounds can provide.  You may be given earphones to listen for instructions.  You may eat a light breakfast and take medications as ordered with the exception of furosemide, hydrochlorothiazide, chlorthalidone or spironolactone (or any other fluid pill). If you are undergoing a stress MRI, please avoid stimulants for 12 hr prior to test. (I.e. Caffeine, nicotine , chocolate, or antihistamine medications)  If your provider has ordered anti-anxiety medications for this test, then you will need a driver.  An IV will be inserted into one of your veins. Contrast material will be injected into your IV. It will leave your body through your urine within a day. You may be told to drink plenty of fluids to help flush the contrast material out of your system.  You will be asked to remove all metal, including: Watch, jewelry, and other metal objects including hearing aids, hair pieces and dentures. Also wearable glucose monitoring  systems (ie. Freestyle Libre and Omnipods) (Braces and fillings normally are not a problem.)   TEST WILL TAKE APPROXIMATELY 1 HOUR  PLEASE NOTIFY SCHEDULING AT LEAST 24 HOURS IN ADVANCE IF YOU ARE UNABLE TO KEEP YOUR APPOINTMENT. [phone]  For more information and frequently asked questions, please visit our website : [email or web address]  Please call the Cardiac Imaging Nurse Navigators with any questions/concerns. [phone] Office         Dear Sharon [name]  You are scheduled for a TEE (Transesophageal Echocardiogram) on Friday, November 14 with Dr. Soyla China.  Please arrive at the [facility] (Main Entrance A) at [facility]: [address] [place], [place] [address] at 7:30 AM (This time is 1 hour(s) before your procedure to ensure your preparation).   Free valet parking service is available. You will check in at ADMITTING.   *Please Note: You will receive a call the day before your procedure to confirm the appointment time. That time may have changed from the original time based on the schedule for that day.*    DIET:  Nothing to eat or drink after midnight except a sip of water with medications (see medication instructions below)  MEDICATION INSTRUCTIONS: !!IF ANY NEW MEDICATIONS ARE STARTED AFTER TODAY, PLEASE NOTIFY YOUR PROVIDER AS SOON AS POSSIBLE!!  FYI: Medications such as Semaglutide (Ozempic, Wegovy), Tirzepatide (Mounjaro, Zepbound), Dulaglutide (Trulicity), etc (GLP1 agonists) AND Canagliflozin (Invokana), Dapagliflozin (Farxiga), Empagliflozin (Jardiance), Ertugliflozin (Steglatro), Bexagliflozin [employer]) or any combination with one of these drugs such as Invokamet (Canagliflozin/Metformin), Synjardy (Empagliflozin/Metformin), etc (SGLT2 inhibitors) must be held  around the time of a procedure. This is not a comprehensive list of all of these drugs. Please review all of your medications and talk to your provider if you take any  one of these. If you are not sure, ask your provider.    LABS: Done after office visit on 03/28/24 (BMET and CBC)  FYI:  For your safety, and to allow us  to monitor your vital signs accurately during the surgery/procedure we request: If you have artificial nails, gel coating, SNS etc, please have those removed prior to your surgery/procedure. Not having the nail coverings /polish removed may result in cancellation or delay of your surgery/procedure.  Your support person will be asked to wait in the waiting room during your procedure.  It is OK to have someone drop you off and come back when you are ready to be discharged.  You cannot drive after the procedure and will need someone to drive you home.  Bring your insurance cards.  *Special Note: Every effort is made to have your procedure done on time. Occasionally there are emergencies that occur at the hospital that may cause delays. Please be patient if a delay does occur.     Follow-Up: At [facility], you and your health needs are our priority.  As part of our continuing mission to provide you with exceptional heart care, our providers are all part of one team.  This team includes your primary Cardiologist (physician) and Advanced Practice Providers or APPs (Physician Assistants and Nurse Practitioners) who all work together to provide you with the care you need, when you need it.  Your next appointment:    05/07/24 at 10:20 am  Provider:   Soyla DELENA Merck, MD

## 2024-03-28 NOTE — Progress Notes (Signed)
 Cardiology Office Note:  .   Date:  03/28/2024  ID:  Dorthea DELENA Shed, DOB 1961-02-15, MRN [id number] PCP: Silvano Angeline FALCON, NP  [facility] Providers Cardiologist:  Soyla DELENA Merck, MD    History of Present Illness: .   FLORENCIA ZACCARO is a 63 y.o. female.  Discussed the use of AI scribe software for clinical note transcription with the patient, who gave verbal consent to proceed.  History of Present Illness LESSLY STIGLER is a 63 year old female with aortic valve stenosis and coronary artery disease who presents with shortness of breath.  She experiences shortness of breath during activities like cleaning, sweeping, grocery shopping, and loading items. This is sometimes accompanied by coughing. She uses an inhaler every morning, which helps reduce her coughing, and also uses Flonase and allergy medication.  She has aortic valve stenosis and coronary artery disease, with a previous anterior STEMI in 2009. Two overlapping stents were placed in the mid LAD, and she has a chronic total occlusion of the RCA. A stress test in May of last year showed mild abnormalities and she did have chest discomfort with pharmacologic stress. She was on Ranexa, which helped somewhat but did not completely alleviate her shortness of breath. She had similar symptoms at the time of stress testing, and we discuss that this should serve as the ischemic eval in the workup of her dyspnea, but we will revisit if necessary with cath.   She has a smoking history, having smoked since age 63, and continues to smoke. Her oxygen levels have been around 93 to 95% since she started coughing, down from her usual 97 to 98%.    ROS: negative except per HPI above.  Studies Reviewed: .        Results DIAGNOSTIC Chemical stress test: Predominantly normal with minor abnormality and discomfort (09/2022) Risk Assessment/Calculations:       Physical Exam:   VS:  BP (!) 159/75   Pulse 67   Ht 5' 4 (1.626 m)    Wt 137 lb 11.2 oz (62.5 kg)   SpO2 95%   BMI 23.64 kg/m    Wt Readings from Last 3 Encounters:  03/28/24 137 lb 11.2 oz (62.5 kg)  02/23/24 136 lb (61.7 kg)  04/19/23 138 lb (62.6 kg)     Physical Exam GENERAL: Alert, cooperative, well developed, no acute distress. HEENT: Normocephalic, normal oropharynx, moist mucous membranes. CHEST: Clear to auscultation bilaterally, no wheezes, rhonchi, or crackles. CARDIOVASCULAR: Normal heart rate and rhythm, S1 and S2 normal, 2/6 SEM and HDM ABDOMEN: Soft, non-tender, non-distended, without organomegaly, normal bowel sounds. EXTREMITIES: No cyanosis or edema. NEUROLOGICAL: Cranial nerves grossly intact, moves all extremities without gross motor or sensory deficit.   ASSESSMENT AND PLAN: .    Assessment and Plan Assessment & Plan Aortic valve stenosis with regurgitation Moderate-severe stenosis with moderate-severe regurgitation causing symptoms, likely due to valve dysfunction. - Order cardiac MRI to assess valve leakage and LV volumes. - Schedule TEE on November 14th for further evaluation of aortic valve and prior stroke eval.   Coronary artery disease with prior anterior STEMI and stents HLD Coronary artery disease with prior STEMI and stents. Symptoms may relate to coronary artery disease. Previous stress test showed mild abnormalities. - Monitor symptoms and consider heart catheterization if symptoms worsen or if surgical intervention for valve is pursued. - Continue Ranexa 500 mg twice daily. - continue Asa 81 mg daily and plavix  75 mg daily. May be able to  deescalate to plavix  monotherapy.  - continue crestor  40 mg daily, zetia  10 mg daily,   Ischemic cardiomyopathy with recovered ejection fraction Ischemic cardiomyopathy with previously reduced ejection fraction, now recovered. - Evaluate cardiac function with cardiac MRI.  Shortness of breath and cough, multifactorial (cardiac and pulmonary) Symptoms likely multifactorial,  with cardiac and potential pulmonary contributions. - Refer to pulmonology for evaluation of pulmonary function and potential impact on symptoms. - Review inhaler use and consider optimization of pulmonary treatment.  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 - Schedule TEE on November 14th to evaluate for cardiac source of embolism.  Left lower lobe lung cancer, status post wedge resection Status post wedge resection in 2016, no current follow-up. - Refer to pulmonology for evaluation and follow-up.  Tobacco use disorder Current smoker with long history, contributing to pulmonary symptoms and health risk.  Obstructive sleep apnea (suspected) Suspected obstructive sleep apnea with symptoms of snoring. - Complete home sleep test to evaluate for obstructive sleep apnea.  Recording duration: 24 minutes      Peggye Poon, MD, FACC

## 2024-04-02 ENCOUNTER — Telehealth: Payer: Self-pay | Admitting: Internal Medicine

## 2024-04-02 NOTE — Telephone Encounter (Signed)
 Pt called in requesting something for anxiety please advise

## 2024-04-05 ENCOUNTER — Ambulatory Visit (HOSPITAL_COMMUNITY)

## 2024-04-09 ENCOUNTER — Ambulatory Visit (HOSPITAL_COMMUNITY)
Admission: RE | Admit: 2024-04-09 | Discharge: 2024-04-09 | Disposition: A | Discharge status: Home / Self Care | Source: Ambulatory Visit | Attending: Internal Medicine | Admitting: Internal Medicine

## 2024-04-09 ENCOUNTER — Inpatient Hospital Stay: Attending: Internal Medicine

## 2024-04-09 DIAGNOSIS — C3490 Malignant neoplasm of unspecified part of unspecified bronchus or lung: Secondary | ICD-10-CM

## 2024-04-09 LAB — CBC WITH DIFFERENTIAL (CANCER CENTER ONLY)
Abs Immature Granulocytes: 0.01 K/uL (ref 0.00–0.07)
Basophils Absolute: 0 K/uL (ref 0.0–0.1)
Basophils Relative: 0 %
Eosinophils Absolute: 0.1 K/uL (ref 0.0–0.5)
Eosinophils Relative: 1 %
HCT: 42.7 % (ref 36.0–46.0)
Hemoglobin: 14.7 g/dL (ref 12.0–15.0)
Immature Granulocytes: 0 %
Lymphocytes Relative: 38 %
Lymphs Abs: 2.5 K/uL (ref 0.7–4.0)
MCH: 33.6 pg (ref 26.0–34.0)
MCHC: 34.4 g/dL (ref 30.0–36.0)
MCV: 97.5 fL (ref 80.0–100.0)
Monocytes Absolute: 0.5 K/uL (ref 0.1–1.0)
Monocytes Relative: 8 %
Neutro Abs: 3.4 K/uL (ref 1.7–7.7)
Neutrophils Relative %: 53 %
Platelet Count: 194 K/uL (ref 150–400)
RBC: 4.38 MIL/uL (ref 3.87–5.11)
RDW: 13.8 % (ref 11.5–15.5)
WBC Count: 6.6 K/uL (ref 4.0–10.5)
nRBC: 0 % (ref 0.0–0.2)

## 2024-04-09 LAB — CMP (CANCER CENTER ONLY)
ALT: 12 U/L (ref 0–44)
AST: 22 U/L (ref 15–41)
Albumin: 3.9 g/dL (ref 3.5–5.0)
Alkaline Phosphatase: 56 U/L (ref 38–126)
Anion gap: 5 (ref 5–15)
BUN: 9 mg/dL (ref 8–23)
CO2: 27 mmol/L (ref 22–32)
Calcium: 9.2 mg/dL (ref 8.9–10.3)
Chloride: 104 mmol/L (ref 98–111)
Creatinine: 0.82 mg/dL (ref 0.44–1.00)
GFR, Estimated: >60 mL/min (ref >60)
Glucose, Bld: 89 mg/dL (ref 70–99)
Potassium: 4.4 mmol/L (ref 3.5–5.1)
Sodium: 136 mmol/L (ref 135–145)
Total Bilirubin: 0.7 mg/dL (ref 0.0–1.2)
Total Protein: 7 g/dL (ref 6.5–8.1)

## 2024-04-09 MED ORDER — IOHEXOL 300 MG/ML  SOLN
75.0000 mL | Freq: Once | INTRAMUSCULAR | Status: AC | PRN
  Administered 2024-04-09: 75 mL via INTRAVENOUS

## 2024-04-09 MED ORDER — SODIUM CHLORIDE (PF) 0.9 % IJ SOLN
INTRAMUSCULAR | Status: AC
  Filled 2024-04-09: qty 50

## 2024-04-10 ENCOUNTER — Other Ambulatory Visit: Payer: Self-pay | Admitting: *Deleted

## 2024-04-10 DIAGNOSIS — I351 Nonrheumatic aortic (valve) insufficiency: Secondary | ICD-10-CM

## 2024-04-11 ENCOUNTER — Ambulatory Visit (INDEPENDENT_AMBULATORY_CARE_PROVIDER_SITE_OTHER)

## 2024-04-11 VITALS — BP 134/72 | HR 72 | Temp 97.8°F | Ht 64.0 in | Wt 135.0 lb

## 2024-04-11 DIAGNOSIS — F17200 Nicotine dependence, unspecified, uncomplicated: Secondary | ICD-10-CM

## 2024-04-11 DIAGNOSIS — J439 Emphysema, unspecified: Secondary | ICD-10-CM | POA: Diagnosis not present

## 2024-04-11 DIAGNOSIS — Z85118 Personal history of other malignant neoplasm of bronchus and lung: Secondary | ICD-10-CM | POA: Diagnosis not present

## 2024-04-11 DIAGNOSIS — R911 Solitary pulmonary nodule: Secondary | ICD-10-CM | POA: Diagnosis not present

## 2024-04-11 DIAGNOSIS — F17210 Nicotine dependence, cigarettes, uncomplicated: Secondary | ICD-10-CM

## 2024-04-11 DIAGNOSIS — Z902 Acquired absence of lung [part of]: Secondary | ICD-10-CM | POA: Diagnosis not present

## 2024-04-11 DIAGNOSIS — J4489 Other specified chronic obstructive pulmonary disease: Secondary | ICD-10-CM

## 2024-04-11 DIAGNOSIS — R0602 Shortness of breath: Secondary | ICD-10-CM

## 2024-04-11 LAB — CBC WITH DIFFERENTIAL/PLATELET
Basophils Absolute: 0 K/uL (ref 0.0–0.1)
Basophils Relative: 0.6 % (ref 0.0–3.0)
Eosinophils Absolute: 0.1 K/uL (ref 0.0–0.7)
Eosinophils Relative: 0.9 % (ref 0.0–5.0)
HCT: 45.8 % (ref 36.0–46.0)
Hemoglobin: 15.5 g/dL — ABNORMAL HIGH (ref 12.0–15.0)
Lymphocytes Relative: 34.1 % (ref 12.0–46.0)
Lymphs Abs: 2.2 K/uL (ref 0.7–4.0)
MCHC: 33.9 g/dL (ref 30.0–36.0)
MCV: 98.7 fl (ref 78.0–100.0)
Monocytes Absolute: 0.5 K/uL (ref 0.1–1.0)
Monocytes Relative: 7.2 % (ref 3.0–12.0)
Neutro Abs: 3.7 K/uL (ref 1.4–7.7)
Neutrophils Relative %: 57.2 % (ref 43.0–77.0)
Platelets: 202 K/uL (ref 150.0–400.0)
RBC: 4.64 Mil/uL (ref 3.87–5.11)
RDW: 14.5 % (ref 11.5–15.5)
WBC: 6.5 K/uL (ref 4.0–10.5)

## 2024-04-11 MED ORDER — VARENICLINE TARTRATE (STARTER) 0.5 MG X 11 & 1 MG X 42 PO TBPK: ORAL_TABLET | ORAL | 0 refills | Status: AC

## 2024-04-11 NOTE — Patient Instructions (Addendum)
     VISIT SUMMARY: Today, we discussed your ongoing issues with shortness of breath and cough, which may be related to your COPD, emphysema, and smoking history. We also reviewed the new pulmonary nodule found on your recent CT scan and your history of heart disease. Additionally, we addressed your nicotine  dependence and allergic rhinitis.  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 Emphysema is a type of COPD that damages the air sacs in your lungs. We will continue your current inhalers, Trelegy and Respiflix, and have ordered allergy testing and pulmonary function tests to better understand your lung function. We also discussed smoking cessation options, including Chantix and nicotine  patches, and provided information on [place] 's smoking cessation resources.  -PULMONARY NODULE, LEFT LUNG, NEW, UNDER EVALUATION IN PATIENT WITH HISTORY OF LEFT LUNG CANCER, STATUS POST LEFT LOWER LOBECTOMY: A pulmonary nodule is a small growth in the lung. Given your history of lung cancer, we need to monitor this new nodule closely. We are awaiting the radiologist's report and plan to do a follow-up CT scan in three months. If the nodule persists or grows, we may need to consider a biopsy.  -NICOTINE  DEPENDENCE, CURRENT, CIGARETTES: Nicotine  dependence is an addiction to tobacco products. You have been smoking since age 63 and currently smoke one pack per day. We discussed the importance of quitting smoking to prevent further lung damage. We prescribed Chantix with a gradual dose increase to minimize side effects and recommended combining it with nicotine  patches. We also provided information on [place] 's smoking cessation resources and encouraged you to find alternative activities to replace smoking.  -ALLERGIC RHINITIS, UNDER EVALUATION: Allergic rhinitis is an  allergic reaction that causes sneezing, congestion, and a runny nose. We suspect your recent clear sputum production may be related to allergies. We have ordered allergy testing to confirm this and will continue your current allergy medications, Flonase and loratadine.  INSTRUCTIONS: Please follow up with the ordered allergy testing and pulmonary function tests. We will also need to review the radiologist's report on your new pulmonary nodule and plan for a follow-up CT scan in three months. If you have any questions or concerns, please do not hesitate to contact our office.   Call 1-800-quit-NOW to get free nicotine  replacement and counseling from the state of [place]

## 2024-04-11 NOTE — Progress Notes (Signed)
 Subjective:   PATIENT ID: Sharon [name] GENDER: female DOB: 09/13/1960, MRN: [id number]   HPI Discussed the use of AI scribe software for clinical note transcription with the patient, who gave verbal consent to proceed.  History of Present Illness Sharon [name] is a 63 year old female with lung cancer, heart failure with recovered ejection fraction, moderate to severe aortic stenosis, who presents with shortness of breath and cough.  She has been experiencing shortness of breath for a couple of years, which has recently been accompanied by the production of clear sputum. She is unsure if this is related to allergies. She uses Flonase nasal spray every morning and takes an allergy pill to manage her symptoms.  Her history of lung cancer was initially diagnosed in 2016, leading to the removal of the lower lobe of her left lung. She underwent immunotherapy for two years post-surgery. Surveillance CT scans have been conducted regularly, with the most recent scan performed two days ago. Previous scans showed small spots on both lungs.  She was informed during this visit that the CAT scan has not been read but it does show a right upper lobe small new nodule  She has a history of heart disease, including a previous heart attack and the placement of two stents in 2009. She continues to take Plavix  and low-dose aspirin . She also has a tight aortic valve and is scheduled for a transesophageal echocardiogram (TEE) tomorrow. She reports a history of heart failure.  She has been told she has emphysema and has a long history of smoking. She started smoking at age 71 and currently smokes one pack per day, having reduced from two packs per day due to financial constraints. She uses Trelegy and Respiflix inhalers, and a nebulizer at home for her breathing issues. Her shortness of breath worsens during activities such as shopping or cleaning.  She reports a diagnosis of osteoporosis. No recent pulmonary  function tests have been conducted since 2016.     Past Medical History:  Diagnosis Date   Aortic insufficiency    Echo 8/09:  EF 55-60, impaired relaxation, mild aortic stenosis (mean 8.1), moderate aortic insufficiency, trace MR, trace TR // Echo 4/18: mild LVH, EF 55-60, no RWMA, Gr 1 DD, mod AI // Echo 5/19: EF 55-60, normal diastolic function, mild aortic stenosis (mean 16, peak 30), moderate aortic insufficiency    Arthritis    Carotid artery disease 03/09/2011   Carotid US  8/15 - R 40-59; L1-39 >> follow-up 1 year // Carotid US  4/18: ICA 1-39 bilaterally - repeat 1 yr// Carotid US  5/19: Bilateral ICA 1-39   COPD (chronic obstructive pulmonary disease) (HCC)    Coronary artery disease involving native coronary artery of native heart without angina pectoris 11/24/2009   s/p ant STEMI 5/09 >> LHC 5/09 - LM normal; LAD proximal 99; branching DX with one branch occluded and the other severely diseased; LCx okay with irregularities in the OM1; RCA proximal occluded-CTO; EF 30-40, anterior wall HK  >>  PCI: 3 x 18 mm Promus DES, 2.5 x 15 mm Promus DES to the LAD   Emphysema of lung (HCC)    Headache 09/06/2016   Heart murmur    History of acute anterior wall MI 12/2007   s/p DES x 2 to LAD   Hyperlipidemia    Ischemic cardiomyopathy    Non-small cell lung cancer (HCC) dx'd 06/2014   per CT CHEST/PET 2/4 and 07/18/14 @ [place]   Pneumonia  2015   HX BRONCHITIS     Thyroid  disease    Tobacco user      Family History  Problem Relation Age of Onset   Diabetes Mother    Hypertension Mother    Cancer Father        pancreatic and esophageal   Breast cancer Sister    Obesity Sister    Breast cancer Sister    Cancer Sister        breast     Social History   Socioeconomic History   Marital status: Single    Spouse name: Not on file   Number of children: Not on file   Years of education: Not on file   Highest education level: Not on file  Occupational History   Not on file   Tobacco Use   Smoking status: Every Day    Current packs/day: 2.00    Average packs/day: 2.0 packs/day for 50.0 years (100.0 ttl pk-yrs)    Types: Cigarettes   Smokeless tobacco: Never   Tobacco comments:    Currently 1 ppd 04/11/24   Vaping Use   Vaping status: Some Days  Substance and Sexual Activity   Alcohol use: Yes    Comment: occasional    (RARELY)   Drug use: No   Sexual activity: Not Currently  Other Topics Concern   Not on file  Social History Narrative   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   Divorced   Social Drivers of Health   Financial Resource Strain: Low Risk  (10/09/2023)   Received from [facility]   Overall Financial Resource Strain (CARDIA)    Difficulty of Paying Living Expenses: Not hard at all  Food Insecurity: No Food Insecurity (10/09/2023)   Received from [facility]   Hunger Vital Sign    Within the past 12 months, you worried that your food would run out before you got the money to buy more.: Never true    Within the past 12 months, the food you bought just didn't last and you didn't have money to get more.: Never true  Transportation Needs: No Transportation Needs (10/09/2023)   Received from [facility] - Transportation    Lack of Transportation (Medical): No    Lack of Transportation (Non-Medical): No  Physical Activity: Unknown (10/09/2023)   Received from [facility]   Exercise Vital Sign    On average, how many days per week do you engage in moderate to strenuous exercise (like a brisk walk)?: 0 days    Minutes of Exercise per Session: Not on file  Stress: Stress Concern Present (10/09/2023)   Received from [facility] - Occupational Stress Questionnaire    Feeling of Stress : To some extent  Social Connections: Socially Integrated (10/09/2023)   Received from [facility]   Social Network    How would you rate your social network (family, work, friends)?: Good participation with  social networks  Intimate Partner Violence: Not At Risk (10/09/2023)   Received from [facility]   HITS    Over the last 12 months how often did your partner physically hurt you?: Never    Over the last 12 months how often did your partner insult you or talk down to you?: Never    Over the last 12 months how often did your partner threaten you with physical harm?: Never    Over the last 12 months how often did your partner scream  or curse at you?: Never     Allergies  Allergen Reactions   Codeine Shortness Of Breath, Nausea Only and Swelling    Tongue swells   Lipitor [Atorvastatin  Calcium ] Other (See Comments)    Muscle ache   Morphine And Codeine Nausea And Vomiting   Oxycodone -Acetaminophen  Nausea And Vomiting   Budeson-Glycopyrrol-Formoterol     Other Reaction(s): dizziness   Metoprolol      Hypotension      Outpatient Medications Prior to Visit  Medication Sig Dispense Refill   acetaminophen  (TYLENOL ) 500 MG tablet Take 1,000 mg by mouth every 6 (six) hours as needed for moderate pain or headache.     Albuterol  Sulfate (PROAIR  RESPICLICK) 108 (90 Base) MCG/ACT AEPB Inhale 2 puffs into the lungs daily as needed (shortness of breath).     aspirin  (ASPIR-LOW) 81 MG EC tablet Take 81 mg by mouth daily.      cetirizine (ZYRTEC) 10 MG tablet Take 10 mg by mouth daily.     Cholecalciferol (VITAMIN D) 125 MCG CAPS Take 125 mcg by mouth daily.     clopidogrel  (PLAVIX ) 75 MG tablet Take 75 mg by mouth daily.     Coenzyme Q10 (CO Q 10 PO) Take 200 mg by mouth at bedtime.     Cyanocobalamin (VITAMIN B 12 PO) Take 1 tablet by mouth daily at 12 noon.     escitalopram (LEXAPRO) 10 MG tablet Take 10 mg by mouth at bedtime.     ezetimibe  (ZETIA ) 10 MG tablet TAKE 1 TABLET BY MOUTH DAILY 90 tablet 3   fluticasone (FLONASE) 50 MCG/ACT nasal spray Place 1 spray into both nostrils in the morning.     gabapentin (NEURONTIN) 300 MG capsule Take 300 mg by mouth 3 (three) times daily.      levothyroxine  (SYNTHROID ) 75 MCG tablet Take 75 mcg by mouth daily.     Magnesium 200 MG TABS Take 1 tablet by mouth at bedtime.     nitroGLYCERIN  (NITROSTAT ) 0.4 MG SL tablet Place 0.4 mg under the tongue every 5 (five) minutes as needed for chest pain.     ranolazine (RANEXA) 500 MG 12 hr tablet Take 500 mg by mouth 2 (two) times daily.     rosuvastatin  (CRESTOR ) 40 MG tablet Take 1 tablet (40 mg total) by mouth daily. 90 tablet 3   TRELEGY ELLIPTA 100-62.5-25 MCG/ACT AEPB Inhale 1 puff into the lungs daily.     VALIUM 5 MG tablet Take 5 mg by mouth daily as needed for anxiety.     ranolazine (RANEXA) 500 MG 12 hr tablet Take 500 mg by mouth 2 (two) times daily.     No facility-administered medications prior to visit.    ROS Reviewed all systems and reported negative except as above     Objective:   Vitals:   04/11/24 0853  BP: 134/72  Pulse: 72  Temp: 97.8 F (36.6 C)  TempSrc: Oral  SpO2: 95%  Weight: 135 lb (61.2 kg)  Height: 5' 4 (1.626 m)    Physical Exam Physical Exam GENERAL: Appropriate to age, no acute distress. HEAD EYES EARS NOSE THROAT: Moist mucous membranes, atraumatic, normocephalic. CHEST: Clear to auscultation bilaterally, no wheezing, no crackles, no rales. CARDIAC: Regular rate and rhythm, normal S1, normal S2, no murmurs, no rubs, no gallops. ABDOMEN: Soft, nontender. NEUROLOGICAL: Motor and sensation grossly intact, alert and oriented times X 3. EXTREMITIES: Warm, well perfused, no edema.     CBC    Component Value Date/Time  WBC 6.6 04/09/2024 1202   WBC 7.1 08/14/2018 0705   RBC 4.38 04/09/2024 1202   HGB 14.7 04/09/2024 1202   HGB 15.4 05/25/2017 1344   HCT 42.7 04/09/2024 1202   HCT 45.5 05/25/2017 1344   PLT 194 04/09/2024 1202   PLT 205 05/25/2017 1344   MCV 97.5 04/09/2024 1202   MCV 95.2 05/25/2017 1344   MCH 33.6 04/09/2024 1202   MCHC 34.4 04/09/2024 1202   RDW 13.8 04/09/2024 1202   RDW 14.3 05/25/2017 1344   LYMPHSABS  2.5 04/09/2024 1202   LYMPHSABS 2.2 05/25/2017 1344   MONOABS 0.5 04/09/2024 1202   MONOABS 0.5 05/25/2017 1344   EOSABS 0.1 04/09/2024 1202   EOSABS 0.1 05/25/2017 1344   BASOSABS 0.0 04/09/2024 1202   BASOSABS 0.1 05/25/2017 1344     Chest imaging:  Although the CT scan has not been read it does show a new right upper lobe small nodule that has grown in size compared to the prior CT.  It appears spiculated.     PFT: PFTs in 2016 with an FEV1 and FVC of 82% predicted.   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 CT scan shows emphysema. Trelegy effective, but smoking cessation crucial to prevent further lung damage. Consideration of Otrevir as add-on treatment not indicated due to infrequent shortness of breath. - Continue Trelegy and albuterol  as needed. - Ordered allergy testing to evaluate for potential allergens. - Ordered pulmonary function tests (PFTs) to assess lung function. - Discussed smoking cessation options, including Chantix and nicotine  patches. - Provided information on [place] 's smoking cessation resources.  Pulmonary nodule, right lung, new, under evaluation in patient with history of left lung cancer, status post left lower lobectomy New 6-7 mm pulmonary nodule in right lung on CT scan. Awaiting radiologist's report. Potential need for follow-up CT scan in three months. Discussed potential biopsy if nodule persists or enlarges. - Await radiologist's report on the new pulmonary nodule. - Plan for follow-up CT scan in three months to reassess the nodule. - Will discuss potential biopsy if nodule persists or enlarges.  Alternative options include empiric SBRT which patient states that she is not interested in.  Nicotine  dependence, current, cigarettes Long-standing nicotine  dependence with  smoking since age 14. Currently smoking one pack per day. Previous attempts to quit using Chantix and Wellbutrin . Chantix more effective but caused nightmares. Insurance covers Chantix. Discussed importance of smoking cessation to prevent further lung damage and potential need for oxygen therapy. - Prescribed Chantix with a gradual dose increase to minimize side effects. - Recommended combining Chantix with nicotine  patches. - Provided information on [place] 's smoking cessation resources. - Encouraged finding alternative activities to replace smoking habit.  Allergic rhinitis, under evaluation Allergic rhinitis with recent onset of clear sputum, possibly related to allergies. Allergy testing planned to confirm diagnosis and guide treatment. - Ordered allergy testing to evaluate for potential allergens. - Continue current allergy medications (Flonase and loratadine).        Zola Herter, MD [facility] Office: [phone]

## 2024-04-12 ENCOUNTER — Encounter (HOSPITAL_COMMUNITY): Payer: Self-pay

## 2024-04-12 ENCOUNTER — Other Ambulatory Visit: Payer: Self-pay

## 2024-04-12 ENCOUNTER — Ambulatory Visit (HOSPITAL_COMMUNITY)

## 2024-04-12 ENCOUNTER — Ambulatory Visit (HOSPITAL_COMMUNITY)
Admission: RE | Admit: 2024-04-12 | Discharge: 2024-04-12 | Disposition: A | Discharge status: Home / Self Care | Attending: Internal Medicine | Admitting: Internal Medicine

## 2024-04-12 ENCOUNTER — Encounter (HOSPITAL_COMMUNITY): Admission: RE | Disposition: A | Payer: Self-pay | Source: Home / Self Care | Attending: Internal Medicine

## 2024-04-12 DIAGNOSIS — I2510 Atherosclerotic heart disease of native coronary artery without angina pectoris: Secondary | ICD-10-CM | POA: Diagnosis not present

## 2024-04-12 DIAGNOSIS — J449 Chronic obstructive pulmonary disease, unspecified: Secondary | ICD-10-CM

## 2024-04-12 DIAGNOSIS — I700 Atherosclerosis of aorta: Secondary | ICD-10-CM | POA: Insufficient documentation

## 2024-04-12 DIAGNOSIS — I252 Old myocardial infarction: Secondary | ICD-10-CM | POA: Insufficient documentation

## 2024-04-12 DIAGNOSIS — H5462 Unqualified visual loss, left eye, normal vision right eye: Secondary | ICD-10-CM | POA: Insufficient documentation

## 2024-04-12 DIAGNOSIS — I350 Nonrheumatic aortic (valve) stenosis: Secondary | ICD-10-CM

## 2024-04-12 DIAGNOSIS — E785 Hyperlipidemia, unspecified: Secondary | ICD-10-CM | POA: Diagnosis not present

## 2024-04-12 DIAGNOSIS — Z79899 Other long term (current) drug therapy: Secondary | ICD-10-CM | POA: Insufficient documentation

## 2024-04-12 DIAGNOSIS — Z955 Presence of coronary angioplasty implant and graft: Secondary | ICD-10-CM | POA: Insufficient documentation

## 2024-04-12 DIAGNOSIS — Z7902 Long term (current) use of antithrombotics/antiplatelets: Secondary | ICD-10-CM | POA: Diagnosis not present

## 2024-04-12 DIAGNOSIS — F17200 Nicotine dependence, unspecified, uncomplicated: Secondary | ICD-10-CM | POA: Insufficient documentation

## 2024-04-12 DIAGNOSIS — I255 Ischemic cardiomyopathy: Secondary | ICD-10-CM | POA: Insufficient documentation

## 2024-04-12 DIAGNOSIS — I352 Nonrheumatic aortic (valve) stenosis with insufficiency: Secondary | ICD-10-CM | POA: Diagnosis not present

## 2024-04-12 DIAGNOSIS — Z7982 Long term (current) use of aspirin: Secondary | ICD-10-CM | POA: Insufficient documentation

## 2024-04-12 DIAGNOSIS — Z85118 Personal history of other malignant neoplasm of bronchus and lung: Secondary | ICD-10-CM | POA: Diagnosis not present

## 2024-04-12 DIAGNOSIS — F17210 Nicotine dependence, cigarettes, uncomplicated: Secondary | ICD-10-CM | POA: Diagnosis not present

## 2024-04-12 DIAGNOSIS — I351 Nonrheumatic aortic (valve) insufficiency: Secondary | ICD-10-CM

## 2024-04-12 DIAGNOSIS — E039 Hypothyroidism, unspecified: Secondary | ICD-10-CM | POA: Insufficient documentation

## 2024-04-12 HISTORY — PX: TRANSESOPHAGEAL ECHOCARDIOGRAM (CATH LAB): EP1270

## 2024-04-12 LAB — RESPIRATORY ALLERGY PANEL REGION II W/ RFLX: ~~LOC~~

## 2024-04-12 LAB — ECHO TEE
AR max vel: 1.18 cm2
AV Area VTI: 1.18 cm2
AV Area mean vel: 1.06 cm2
AV Mean grad: 22 mmHg
AV Peak grad: 31.6 mmHg
Ao pk vel: 2.81 m/s

## 2024-04-12 LAB — INTERPRETATION:

## 2024-04-12 SURGERY — TRANSESOPHAGEAL ECHOCARDIOGRAM (TEE) (CATHLAB)
Anesthesia: Monitor Anesthesia Care

## 2024-04-12 MED ORDER — EPHEDRINE SULFATE-NACL 50-0.9 MG/10ML-% IV SOSY
PREFILLED_SYRINGE | INTRAVENOUS | Status: DC | PRN
  Administered 2024-04-12: 10 mg via INTRAVENOUS
  Administered 2024-04-12: 15 mg via INTRAVENOUS

## 2024-04-12 MED ORDER — SODIUM CHLORIDE 0.9 % IV SOLN: INTRAVENOUS | Status: DC

## 2024-04-12 MED ORDER — LIDOCAINE 2% (20 MG/ML) 5 ML SYRINGE
INTRAMUSCULAR | Status: DC | PRN
  Administered 2024-04-12: 100 mg via INTRAVENOUS

## 2024-04-12 MED ORDER — SODIUM CHLORIDE 0.9 % IV SOLN: INTRAVENOUS | Status: DC | PRN

## 2024-04-12 MED ORDER — PHENYLEPHRINE 80 MCG/ML (10ML) SYRINGE FOR IV PUSH (FOR BLOOD PRESSURE SUPPORT)
PREFILLED_SYRINGE | INTRAVENOUS | Status: DC | PRN
  Administered 2024-04-12: 240 ug via INTRAVENOUS
  Administered 2024-04-12: 160 ug via INTRAVENOUS

## 2024-04-12 MED ORDER — PROPOFOL 10 MG/ML IV BOLUS
INTRAVENOUS | Status: DC | PRN
  Administered 2024-04-12 (×2): 20 mg via INTRAVENOUS
  Administered 2024-04-12: 50 mg via INTRAVENOUS
  Administered 2024-04-12: 30 mg via INTRAVENOUS
  Administered 2024-04-12: 20 mg via INTRAVENOUS
  Administered 2024-04-12: 30 mg via INTRAVENOUS
  Administered 2024-04-12 (×2): 20 mg via INTRAVENOUS
  Administered 2024-04-12: 30 mg via INTRAVENOUS

## 2024-04-12 NOTE — Discharge Instructions (Signed)

## 2024-04-12 NOTE — Anesthesia Postprocedure Evaluation (Signed)
 Anesthesia Post Note  Patient: Sharon [name]  Procedure(s) Performed: TRANSESOPHAGEAL ECHOCARDIOGRAM     Patient location during evaluation: PACU Anesthesia Type: MAC Level of consciousness: awake and alert and oriented Pain management: pain level controlled Vital Signs Assessment: post-procedure vital signs reviewed and stable Respiratory status: spontaneous breathing, nonlabored ventilation and respiratory function stable Cardiovascular status: stable and blood pressure returned to baseline Postop Assessment: no apparent nausea or vomiting Anesthetic complications: no   No notable events documented.  Last Vitals:  Vitals:   04/12/24 0915 04/12/24 0925  BP: 91/61 (!) 96/53  Pulse: 72 66  Resp: 18 12  Temp:    SpO2: 96% 95%    Last Pain:  Vitals:   04/12/24 0925  TempSrc:   PainSc: 0-No pain                 Sorin Frimpong A.

## 2024-04-12 NOTE — Transfer of Care (Signed)
 Immediate Anesthesia Transfer of Care Note  Patient: Sharon [name]  Procedure(s) Performed: TRANSESOPHAGEAL ECHOCARDIOGRAM  Patient Location: Cath Lab  Anesthesia Type:MAC  Level of Consciousness: drowsy  Airway & Oxygen Therapy: Patient Spontanous Breathing and Patient connected to nasal cannula oxygen  Post-op Assessment: Report given to RN and Post -op Vital signs reviewed and stable  Post vital signs: Reviewed and stable  Last Vitals:  Vitals Value Taken Time  BP 85/57 04/12/24 08:56  Temp 36.5 C 04/12/24 08:55  Pulse 82 04/12/24 08:58  Resp 17 04/12/24 08:58  SpO2 93 % 04/12/24 08:58  Vitals shown include unfiled device data.  Last Pain:  Vitals:   04/12/24 0855  TempSrc: Tympanic  PainSc: Asleep         Complications: No notable events documented.

## 2024-04-12 NOTE — Interval H&P Note (Signed)
 History and Physical Interval Note:  04/12/2024 7:30 AM  Sharon [name]  has presented today for surgery, with the diagnosis of STROKE, AORTIC INSUFFIENCY.  The various methods of treatment have been discussed with the patient and family. After consideration of risks, benefits and other options for treatment, the patient has consented to  Procedure(s): TRANSESOPHAGEAL ECHOCARDIOGRAM (N/A) as a surgical intervention.  The patient's history has been reviewed, patient examined, no change in status, stable for surgery.  I have reviewed the patient's chart and labs.  Questions were answered to the patient's satisfaction.     Franklin Clapsaddle A Payeton [name]

## 2024-04-12 NOTE — Anesthesia Preprocedure Evaluation (Signed)
 Anesthesia Evaluation  Patient identified by MRN, date of birth, ID band Patient awake    Reviewed: Allergy & Precautions, NPO status , Patient's Chart, lab work & pertinent test results  Airway Mallampati: II  TM Distance: >3 FB Neck ROM: Full    Dental  (+) Edentulous Upper, Edentulous Lower   Pulmonary pneumonia, resolved, COPD,  COPD inhaler, Current SmokerPatient did not abstain from smoking.   breath sounds clear to auscultation + decreased breath sounds      Cardiovascular + CAD, + Past MI and + Cardiac Stents  Normal cardiovascular exam+ Valvular Problems/Murmurs AI  Rhythm:Regular Rate:Normal  Stents x 2 2009 MI x 2 2009   Neuro/Psych  Headaches Decreased Visual acuity OS CVA, Residual Symptoms  negative psych ROS   GI/Hepatic negative GI ROS, Neg liver ROS,,,  Endo/Other  Hypothyroidism  Hyperlipidemia  Renal/GU negative Renal ROS  negative genitourinary   Musculoskeletal  (+) Arthritis , Osteoarthritis,    Abdominal   Peds  Hematology Plavix  therapy- last dose this am   Anesthesia Other Findings   Reproductive/Obstetrics                              Anesthesia Physical Anesthesia Plan  ASA: 3  Anesthesia Plan: MAC   Post-op Pain Management: Minimal or no pain anticipated   Induction: Intravenous  PONV Risk Score and Plan: 1 and Propofol  infusion and Treatment may vary due to age or medical condition  Airway Management Planned: Natural Airway, Simple Face Mask and Nasal Cannula  Additional Equipment: None  Intra-op Plan:   Post-operative Plan:   Informed Consent: I have reviewed the patients History and Physical, chart, labs and discussed the procedure including the risks, benefits and alternatives for the proposed anesthesia with the patient or authorized representative who has indicated his/her understanding and acceptance.     Dental advisory given  Plan  Discussed with: Anesthesiologist and CRNA  Anesthesia Plan Comments:          Anesthesia Quick Evaluation

## 2024-04-12 NOTE — CV Procedure (Signed)
 INDICATIONS: AS/AI Stroke  PROCEDURE:   Informed consent was obtained prior to the procedure. The risks, benefits and alternatives for the procedure were discussed and the patient comprehended these risks.  Risks include, but are not limited to, cough, sore throat, vomiting, nausea, somnolence, esophageal and stomach trauma or perforation, bleeding, low blood pressure, aspiration, pneumonia, infection, trauma to the teeth and death.    Procedural time out performed.  During this procedure the patient was administered propofol  per anesthesia.  The patient's heart rate, blood pressure, and oxygen saturation were monitored continuously during the procedure. The period of conscious sedation was 20 minutes, of which I was present face-to-face 100% of this time.  The transesophageal probe was inserted in the esophagus and stomach without difficulty and multiple views were obtained.  The patient was kept under observation until the patient left the procedure room.  The patient left the procedure room in stable condition.   Agitated microbubble saline contrast was administered.  COMPLICATIONS:    There were no immediate complications.  FINDINGS:   FORMAL ECHOCARDIOGRAM REPORT PENDING Normal biventricular function. Moderate AS gradient 22 mmHg Mod-severe AI, vc 0.5 cm, PHT 320 ms No other definite source of stroke.  Bubble study positive for intrapulmonary shunt, small.   RECOMMENDATIONS:     D/c when alert. Will move follow up out 3 mo.   Time Spent Directly with the Patient:  45 minutes   Nyiesha Beever A Burnett Spray 04/12/2024, 9:17 AM

## 2024-04-13 ENCOUNTER — Encounter (HOSPITAL_COMMUNITY): Payer: Self-pay | Admitting: Internal Medicine

## 2024-04-16 ENCOUNTER — Encounter (HOSPITAL_COMMUNITY): Payer: Self-pay

## 2024-04-16 ENCOUNTER — Encounter (HOSPITAL_BASED_OUTPATIENT_CLINIC_OR_DEPARTMENT_OTHER): Admitting: Cardiology

## 2024-04-16 ENCOUNTER — Ambulatory Visit (HOSPITAL_COMMUNITY)

## 2024-04-16 DIAGNOSIS — R0683 Snoring: Secondary | ICD-10-CM

## 2024-04-18 ENCOUNTER — Ambulatory Visit: Attending: Internal Medicine

## 2024-04-18 DIAGNOSIS — R400 Somnolence: Secondary | ICD-10-CM

## 2024-04-18 DIAGNOSIS — R0683 Snoring: Secondary | ICD-10-CM

## 2024-04-18 NOTE — Procedures (Signed)
     SLEEP STUDY REPORT Patient Information Study Date: 04/16/2024 Patient Name: Sharon [name] Patient ID: [id number] Birth Date: July 12, 1960 Age: 63 Gender: Female BMI: 23.3 (W=137 lb, H=5' 4'') Referring Physician: Soyla Merck, MD  TEST DESCRIPTION: Home sleep apnea testing was completed using the WatchPat, a Type 1 device, utilizing peripheral arterial tonometry (PAT), chest movement, actigraphy, pulse oximetry, pulse rate, body position and snore. AHI was calculated with apnea and hypopnea using valid sleep time as the denominator. RDI includes apneas, hypopneas, and RERAs. The data acquired and the scoring of sleep and all associated events were performed in accordance with the recommended standards and specifications as outlined in the AASM Manual for the Scoring of Sleep and Associated Events 2.2.0 (2015).  FINDINGS: 1. No evidence of Obstructive Sleep Apnea with AHI 3.1/hr. 2. No Central Sleep Apnea. 3. Oxygen desaturations as low as 82%. 4. Mild to moderate snoring was present. O2 sats were < 88% for 290.6 minutes. 5. Total sleep time was 6 hrs and 42 min. 6. 30% of total sleep time was spent in REM sleep. 7. Normal sleep onset latency at 18 min. 8. Prolonged REM sleep onset latency at 126 min. 9. Total awakenings were 10.  DIAGNOSIS: Normal study with no significant sleep disordered breathing.  RECOMMENDATIONS: 1. Normal study with no significant sleep disordered breathing. 2. Healthy sleep recommendations include: adequate nightly sleep (normal 7-9 hrs/night), avoidance of caffeine after noon and alcohol near bedtime, and maintaining a sleep environment that is cool, dark and quiet. 3. Weight loss for overweight patients is recommended. 4. Snoring recommendations include: weight loss where appropriate, side sleeping, and avoidance of alcohol before bed. 5. Operation of motor vehicle or dangerous equipment must be avoided when feeling drowsy, excessively sleepy,  or mentally fatigued. 6. An ENT consultation which may be useful for specific causes of and possible treatment of bothersome snoring . 7. Weight loss may be of benefit in reducing the severity of snoring.   Signature: Wilbert Bihari, MD; [facility]; Diplomat, American Board of Sleep Medicine Electronically Signed: 04/18/2024 6:12:54 AM

## 2024-05-01 ENCOUNTER — Telehealth: Payer: Self-pay | Admitting: *Deleted

## 2024-05-01 NOTE — Telephone Encounter (Signed)
 Patient notified of result.  Please refer to phone note from today for complete details.   Joshua Dalton Seip, CMA 05/01/2024 2:41 PM.

## 2024-05-01 NOTE — Telephone Encounter (Signed)
-----   Message from Wilbert Bihari sent at 04/18/2024  6:14 AM EST ----- Please let patient know that sleep study showed no significant sleep apnea.

## 2024-05-03 ENCOUNTER — Encounter: Payer: Self-pay | Admitting: *Deleted

## 2024-05-07 ENCOUNTER — Ambulatory Visit: Admitting: Internal Medicine

## 2024-05-09 ENCOUNTER — Inpatient Hospital Stay: Attending: Internal Medicine | Admitting: Internal Medicine

## 2024-05-09 VITALS — BP 125/56 | HR 73 | Temp 97.8°F | Resp 17 | Ht 64.0 in | Wt 136.0 lb

## 2024-05-09 DIAGNOSIS — Z79899 Other long term (current) drug therapy: Secondary | ICD-10-CM | POA: Diagnosis not present

## 2024-05-09 DIAGNOSIS — C3490 Malignant neoplasm of unspecified part of unspecified bronchus or lung: Secondary | ICD-10-CM | POA: Diagnosis not present

## 2024-05-09 DIAGNOSIS — Z85118 Personal history of other malignant neoplasm of bronchus and lung: Secondary | ICD-10-CM | POA: Diagnosis not present

## 2024-05-09 DIAGNOSIS — F17210 Nicotine dependence, cigarettes, uncomplicated: Secondary | ICD-10-CM | POA: Insufficient documentation

## 2024-05-09 DIAGNOSIS — R911 Solitary pulmonary nodule: Secondary | ICD-10-CM | POA: Diagnosis present

## 2024-05-09 NOTE — Progress Notes (Signed)
 [facility] [phone]   [phone]  OFFICE PROGRESS NOTE  Sharon Angeline FALCON, NP [address] [place] [address]  DIAGNOSIS: Recurrent non-small cell lung cancer, adenocarcinoma with PDL 1 expression of 60% diagnosed in November 2016. The patient was initially diagnosed as stage IB (T2a, N0, M0) adenocarcinoma in March 2016.  PRIOR THERAPY:   1) Status post left video-assisted thoracoscopy, wedge resection left lower lobe, thoracoscopic left lower lobectomy and mediastinal lymph node dissection on 08/14/2014.  2) Immunotherapy with Sharon [name] (pembrolizumab ) 200 mg IV every 3 weeks status post 35 cycles. First dose was given 05/12/2015.  This was discontinued after the patient completed 2 years of treatment.  CURRENT THERAPY: Observation.   INTERVAL HISTORY: Sharon [name] 63 y.o. female returns to the clinic today for 45-month follow-up visit.  Discussed the use of AI scribe software for clinical note transcription with the patient, who gave verbal consent to proceed.  History of Present Illness Sharon [name] is a 63 year old female with recurrent non-small cell lung adenocarcinoma who presents for evaluation and repeat chest CT for restaging.  She was diagnosed with non-small cell lung adenocarcinoma of the right lung (PD-L1 60%) in November 2016 and completed two years of Keytruda  immunotherapy, finishing in November 2018. She has been under surveillance since completion of therapy and remains asymptomatic, with no new symptoms or changes in her health since her last visit one year ago.  A recent chest CT for restaging revealed a new 7 mm pulmonary nodule in the right lung. She is aware of this finding and reports no new respiratory symptoms.  She continues to smoke cigarettes, with a fifty-year history and a brief four-month period of abstinence before resuming due to difficulty maintaining cessation.  She also has cardiac valve disease  under evaluation by her cardiologist.    MEDICAL HISTORY: Past Medical History:  Diagnosis Date   Aortic insufficiency    Echo 8/09:  EF 55-60, impaired relaxation, mild aortic stenosis (mean 8.1), moderate aortic insufficiency, trace MR, trace TR // Echo 4/18: mild LVH, EF 55-60, no RWMA, Gr 1 DD, mod AI // Echo 5/19: EF 55-60, normal diastolic function, mild aortic stenosis (mean 16, peak 30), moderate aortic insufficiency    Arthritis    Carotid artery disease 03/09/2011   Carotid US  8/15 - R 40-59; L1-39 >> follow-up 1 year // Carotid US  4/18: ICA 1-39 bilaterally - repeat 1 yr// Carotid US  5/19: Bilateral ICA 1-39   COPD (chronic obstructive pulmonary disease) (HCC)    Coronary artery disease involving native coronary artery of native heart without angina pectoris 11/24/2009   s/p ant STEMI 5/09 >> LHC 5/09 - LM normal; LAD proximal 99; branching DX with one branch occluded and the other severely diseased; LCx okay with irregularities in the OM1; RCA proximal occluded-CTO; EF 30-40, anterior wall HK  >>  PCI: 3 x 18 mm Promus DES, 2.5 x 15 mm Promus DES to the LAD   Emphysema of lung (HCC)    Headache 09/06/2016   Heart murmur    History of acute anterior wall MI 12/2007   s/p DES x 2 to LAD   Hyperlipidemia    Ischemic cardiomyopathy    Non-small cell lung cancer (HCC) dx'd 06/2014   per CT CHEST/PET 2/4 and 07/18/14 @ [place]   Pneumonia    2015   HX BRONCHITIS     Thyroid  disease    Tobacco user  ALLERGIES:  is allergic to codeine, lipitor [atorvastatin  calcium ], morphine and codeine, oxycodone -acetaminophen , budeson-glycopyrrol-formoterol, and metoprolol .  MEDICATIONS:  Current Outpatient Medications  Medication Sig Dispense Refill   acetaminophen  (TYLENOL ) 500 MG tablet Take 1,000 mg by mouth every 6 (six) hours as needed for moderate pain or headache.     Albuterol  Sulfate (PROAIR  RESPICLICK) 108 (90 Base) MCG/ACT AEPB Inhale 2 puffs into the lungs daily as needed  (shortness of breath).     aspirin  (ASPIR-LOW) 81 MG EC tablet Take 81 mg by mouth daily.      cetirizine (ZYRTEC) 10 MG tablet Take 10 mg by mouth daily.     Cholecalciferol (VITAMIN D) 125 MCG CAPS Take 125 mcg by mouth daily.     clopidogrel  (PLAVIX ) 75 MG tablet Take 75 mg by mouth daily.     Coenzyme Q10 (CO Q 10 PO) Take 200 mg by mouth at bedtime.     Cyanocobalamin (VITAMIN B 12 PO) Take 1 tablet by mouth daily at 12 noon.     escitalopram (LEXAPRO) 10 MG tablet Take 10 mg by mouth at bedtime.     ezetimibe  (ZETIA ) 10 MG tablet TAKE 1 TABLET BY MOUTH DAILY 90 tablet 3   fluticasone (FLONASE) 50 MCG/ACT nasal spray Place 1 spray into both nostrils in the morning.     gabapentin (NEURONTIN) 300 MG capsule Take 300 mg by mouth 3 (three) times daily.     levothyroxine  (SYNTHROID ) 75 MCG tablet Take 75 mcg by mouth daily.     Magnesium 200 MG TABS Take 1 tablet by mouth at bedtime.     nitroGLYCERIN  (NITROSTAT ) 0.4 MG SL tablet Place 0.4 mg under the tongue every 5 (five) minutes as needed for chest pain.     ranolazine (RANEXA) 500 MG 12 hr tablet Take 500 mg by mouth 2 (two) times daily.     rosuvastatin  (CRESTOR ) 40 MG tablet Take 1 tablet (40 mg total) by mouth daily. 90 tablet 3   TRELEGY ELLIPTA 100-62.5-25 MCG/ACT AEPB Inhale 1 puff into the lungs daily.     VALIUM 5 MG tablet Take 5 mg by mouth daily as needed for anxiety.     Varenicline  Tartrate, Starter, (CHANTIX  STARTING MONTH PAK) 0.5 MG X 11 & 1 MG X 42 TBPK Take 0.5mg  once daily for 3 days then take 0.5mg  twice daily for 4 days and then take 1mg  twice daily 53 each 0   No current facility-administered medications for this visit.    SURGICAL HISTORY:  Past Surgical History:  Procedure Laterality Date   CARDIAC CATHETERIZATION  10/15/2007   EF 30-40%   CESAREAN SECTION     CHOLECYSTECTOMY     CORONARY ANGIOPLASTY WITH STENT PLACEMENT     LAD   ectopic pregnancies requiring laparotomies     IR KYPHO THORACIC WITH BONE  BIOPSY  08/14/2018   LEAD REMOVAL Left 08/14/2014   Procedure: CRYO INTERCOSTAL NERVE BLOCK;  Surgeon: Sharon JAYSON Millers, MD;  Location: [facility] OR;  Service: Thoracic;  Laterality: Left;   LOBECTOMY Left 08/14/2014   Procedure: LEFT LOWER LOBECTOMY;  Surgeon: Sharon JAYSON Millers, MD;  Location: [facility] OR;  Service: Thoracic;  Laterality: Left;   NODE DISSECTION Left 08/14/2014   Procedure: NODE DISSECTION, LEFT LOWER LOBE LUNG;  Surgeon: Sharon JAYSON Millers, MD;  Location: [facility] OR;  Service: Thoracic;  Laterality: Left;   SHOULDER SURGERY     LEFT X 2   (REMOVED SOME COLLAR BONE )   TRANSESOPHAGEAL ECHOCARDIOGRAM (CATH  LAB) N/A 04/12/2024   Procedure: TRANSESOPHAGEAL ECHOCARDIOGRAM;  Surgeon: Loni Soyla LABOR, MD;  Location: [facility] INVASIVE CV LAB;  Service: Cardiovascular;  Laterality: N/A;   TRANSTHORACIC ECHOCARDIOGRAM  10/17/2007   TUBAL LIGATION     US  ECHOCARDIOGRAPHY  01/07/2008   EF 55-60%   VIDEO ASSISTED THORACOSCOPY (VATS)/WEDGE RESECTION Left 08/14/2014   Procedure: VIDEO ASSISTED THORACOSCOPY (VATS)/WEDGE RESECTION;  Surgeon: Sharon JAYSON Millers, MD;  Location: [facility] OR;  Service: Thoracic;  Laterality: Left;    REVIEW OF SYSTEMS:  Constitutional: positive for fatigue Eyes: negative Ears, nose, mouth, throat, and face: negative Respiratory: negative Cardiovascular: negative Gastrointestinal: negative Genitourinary:negative Integument/breast: negative Hematologic/lymphatic: negative Musculoskeletal:negative Neurological: negative Behavioral/Psych: negative Endocrine: negative Allergic/Immunologic: negative   PHYSICAL EXAMINATION: General appearance: alert, cooperative, and no distress Head: Normocephalic, without obvious abnormality, atraumatic Neck: no adenopathy, no JVD, supple, symmetrical, trachea midline, and thyroid  not enlarged, symmetric, no tenderness/mass/nodules Lymph nodes: Cervical, supraclavicular, and axillary nodes normal. Resp: clear to auscultation  bilaterally Back: negative Cardio: regular rate and rhythm, S1, S2 normal, no murmur, click, rub or gallop GI: soft, non-tender; bowel sounds normal; no masses,  no organomegaly Extremities: extremities normal, atraumatic, no cyanosis or edema Neurologic: Alert and oriented X 3, normal strength and tone. Normal symmetric reflexes. Normal coordination and gait  ECOG PERFORMANCE STATUS: 1 - Symptomatic but completely ambulatory  Blood pressure (!) 125/56, pulse 73, temperature 97.8 F (36.6 C), temperature source Temporal, resp. rate 17, height 5' 4 (1.626 m), weight 136 lb (61.7 kg), SpO2 97%.  LABORATORY DATA: Lab Results  Component Value Date   WBC 6.5 04/11/2024   HGB 15.5 (H) 04/11/2024   HCT 45.8 04/11/2024   MCV 98.7 04/11/2024   PLT 202.0 04/11/2024      Chemistry      Component Value Date/Time   NA 136 04/09/2024 1202   NA 139 05/25/2017 1344   K 4.4 04/09/2024 1202   K 3.5 05/25/2017 1344   CL 104 04/09/2024 1202   CO2 27 04/09/2024 1202   CO2 23 05/25/2017 1344   BUN 9 04/09/2024 1202   BUN 10.0 05/25/2017 1344   CREATININE 0.82 04/09/2024 1202   CREATININE 0.7 05/25/2017 1344      Component Value Date/Time   CALCIUM  9.2 04/09/2024 1202   CALCIUM  9.4 05/25/2017 1344   ALKPHOS 56 04/09/2024 1202   ALKPHOS 94 05/25/2017 1344   AST 22 04/09/2024 1202   AST 23 05/25/2017 1344   ALT 12 04/09/2024 1202   ALT 20 05/25/2017 1344   BILITOT 0.7 04/09/2024 1202   BILITOT 0.38 05/25/2017 1344       RADIOGRAPHIC STUDIES: ECHO TEE Result Date: 04/12/2024    TRANSESOPHOGEAL ECHO REPORT   Patient Name:   Sharon [name] Date of Exam: 04/12/2024 Medical Rec #:  [id number]       Height:       64.0 in Accession #:    [id number]      Weight:       135.0 lb Date of Birth:  11-10-1960       BSA:          1.655 m Patient Age:    63 years        BP:           113/60 mmHg Patient Gender: F               HR:           67 bpm. Exam Location:  Outpatient Procedure:  Transesophageal  Echo, Color Doppler, Cardiac Doppler and Saline            Contrast Bubble Study (Both Spectral and Color Flow Doppler were            utilized during procedure). Indications:     Stroke  History:         Patient has prior history of Echocardiogram examinations, most                  recent 02/26/2024. Cardiomyopathy, CAD, COPD; Risk                  Factors:Dyslipidemia.  Sonographer:     Tinnie Gosling RDCS Referring Phys:  [id number] SOYLA DELENA MERCK Diagnosing Phys: Soyla Merck MD PROCEDURE: After discussion of the risks and benefits of a TEE, an informed consent was obtained. The transesophogeal probe was passed without difficulty through the esophogus of the patient. Imaged were obtained with the patient in a left lateral decubitus position. Sedation performed by different physician. The patient was monitored while under deep sedation. Anesthestetic sedation was provided intravenously by Anesthesiology: 240mg  of Propofol , 100mg  of Lidocaine . Image quality was good. The patient's vital signs; including heart rate, blood pressure, and oxygen saturation; remained stable throughout the procedure. The patient developed no complications during the procedure.  IMPRESSIONS  1. Left ventricular ejection fraction, by estimation, is 60 to 65%. The left ventricle has normal function.  2. Right ventricular systolic function is normal. The right ventricular size is normal.  3. Left atrial size was mildly dilated. No left atrial/left atrial appendage thrombus was detected. The LAA emptying velocity was 69 cm/s.  4. The mitral valve is grossly normal. Trivial mitral valve regurgitation.  5. AI vena contracta 0.5 cm, PHT 327. Moderate severe AI. The aortic valve is abnormal. There is moderate calcification of the aortic valve. Aortic valve regurgitation is moderate to severe. Moderate aortic valve stenosis. Aortic valve area, by VTI measures 1.18 cm. Aortic valve mean gradient measures 22.0 mmHg. Aortic valve Vmax measures  2.81 m/s.  6. There is Moderate (Grade III) plaque involving the aortic arch and descending aorta.  7. Agitated saline contrast bubble study was positive with shunting observed after >6 cardiac cycles suggestive of intrapulmonary shunting.  8. 3D performed of the aortic valve and demonstrates abnormal aortic valve. FINDINGS  Left Ventricle: Left ventricular ejection fraction, by estimation, is 60 to 65%. The left ventricle has normal function. The left ventricular internal cavity size was normal in size. Right Ventricle: The right ventricular size is normal. Right vetricular wall thickness was not well visualized. Right ventricular systolic function is normal. Left Atrium: Left atrial size was mildly dilated. No left atrial/left atrial appendage thrombus was detected. The LAA emptying velocity was 69 cm/s. Right Atrium: Right atrial size was normal in size. Pericardium: Trivial pericardial effusion is present. Mitral Valve: The mitral valve is grossly normal. Trivial mitral valve regurgitation. Tricuspid Valve: The tricuspid valve is normal in structure. Tricuspid valve regurgitation is trivial. Aortic Valve: AI vena contracta 0.5 cm, PHT 327. Moderate severe AI. The aortic valve is abnormal. There is moderate calcification of the aortic valve. Aortic valve regurgitation is moderate to severe. Moderate aortic stenosis is present. Aortic valve mean gradient measures 22.0 mmHg. Aortic valve peak gradient measures 31.6 mmHg. Aortic valve area, by VTI measures 1.18 cm. Pulmonic Valve: The pulmonic valve was normal in structure. Pulmonic valve regurgitation is trivial. Aorta: The aortic root and ascending aorta are structurally normal,  with no evidence of dilitation. There is moderate (Grade III) plaque involving the aortic arch and descending aorta. IAS/Shunts: No atrial level shunt detected by color flow Doppler. Agitated saline contrast was given intravenously to evaluate for intracardiac shunting. Agitated saline  contrast bubble study was positive with shunting observed after >6 cardiac cycles suggestive of intrapulmonary shunting. Additional Comments: 3D was performed not requiring image post processing on an independent workstation and was abnormal. Spectral Doppler performed. LEFT VENTRICLE PLAX 2D LVOT diam:     1.80 cm LV SV:         80 LV SV Index:   48 LVOT Area:     2.54 cm  AORTIC VALVE AV Area (Vmax):    1.18 cm AV Area (Vmean):   1.06 cm AV Area (VTI):     1.18 cm AV Vmax:           281.00 cm/s AV Vmean:          198.500 cm/s AV VTI:            0.679 m AV Peak Grad:      31.6 mmHg AV Mean Grad:      22.0 mmHg LVOT Vmax:         130.00 cm/s LVOT Vmean:        82.900 cm/s LVOT VTI:          0.315 m LVOT/AV VTI ratio: 0.46  AORTA Ao Root diam: 2.90 cm Ao Asc diam:  3.70 cm  SHUNTS Systemic VTI:  0.32 m Systemic Diam: 1.80 cm Soyla Merck MD Electronically signed by Soyla Merck MD Signature Date/Time: 04/12/2024/9:01:48 PM    Final    EP STUDY Result Date: 04/12/2024 See surgical note for result.  CT Chest W Contrast Result Date: 04/12/2024 CLINICAL DATA:  Non-small-cell lung cancer. Restaging. * Tracking Code: BO * EXAM: CT CHEST WITH CONTRAST TECHNIQUE: Multidetector CT imaging of the chest was performed during intravenous contrast administration. RADIATION DOSE REDUCTION: This exam was performed according to the departmental dose-optimization program which includes automated exposure control, adjustment of the mA and/or kV according to patient size and/or use of iterative reconstruction technique. CONTRAST:  75mL OMNIPAQUE  IOHEXOL  300 MG/ML  SOLN COMPARISON:  04/17/2023 FINDINGS: Cardiovascular: The heart size is normal. No substantial pericardial effusion. Coronary artery calcification is evident. Mild atherosclerotic calcification is noted in the wall of the thoracic aorta. Ascending thoracic aorta measures 4.1 cm diameter. Mediastinum/Nodes: 7 mm short axis prevascular node measured previously  is 8 mm today on 63/2. Index right hilar node measured previously at 9 mm short axis is 8 mm short axis today on 68/2. Index high right paratracheal node measured previously at 10 mm short axis is 11 mm short axis today but appears more confluent in soft tissue density. The esophagus has normal imaging features. There is no axillary lymphadenopathy. Lungs/Pleura: Centrilobular and paraseptal emphysema evident. 7 x 6 mm irregular right upper lobe pulmonary nodule on image 38/4 is new in the interval. 3 mm right upper lobe pulmonary nodule identified previously is stable on 55/4 today.r 4 mm subpleural right lower lobe nodule on 101/4 is stable. The bandlike area of opacity in the posterior left costophrenic sulcus identified previously is stable in the interval. Staple line again noted anterior left upper lobe. Upper Abdomen: Visualized portion of the upper abdomen shows no acute findings. Musculoskeletal: No worrisome lytic or sclerotic osseous abnormality. Vertebral augmentation at T9 and T12 again noted. IMPRESSION: 1. New 7 x 6 mm irregular  right upper lobe pulmonary nodule. Potentially infectious/inflammatory, close follow-up recommended to ensure resolution. 2. Other scattered tiny bilateral pulmonary nodules are stable in the interval. 3. Stable bandlike opacity in the posterior left costophrenic sulcus. 4. No substantial change in mediastinal and right hilar index lymph nodes. 5. 4.1 cm diameter ascending thoracic aortic aneurysm. Recommend annual imaging followup by CTA or MRA. This recommendation follows 2010 ACCF/AHA/AATS/ACR/ASA/SCA/SCAI/SIR/STS/SVM Guidelines for the Diagnosis and Management of Patients with Thoracic Aortic Disease. Circulation. 2010; 121: [id number]. Aortic aneurysm NOS (ICD10-I71.9) 6. Aortic Atherosclerosis (ICD10-I70.0) and Emphysema (ICD10-J43.9). Electronically Signed   By: Camellia Candle M.D.   On: 04/12/2024 08:22     ASSESSMENT AND PLAN:  This is a very pleasant 63 years old  white female with recurrent non-small cell lung cancer, adenocarcinoma with positive PDL 1 expression of 60%. The patient completed treatment with immunotherapy with Ketruda 200 mg IV every 3 weeks status post 35 cycles. The patient has been in observation for the last 6 years and she is feeling fine. He had repeat CT scan of the chest performed recently.  I personally independently reviewed the scan images and discussed the results and showed the images to the patient today.  Her scan showed no concerning findings for disease recurrence or metastasis but there was new 0.7 x 0.6 cm irregular right upper lobe pulmonary nodule likely inflammatory in origin but close monitoring is required. Assessment and Plan Assessment & Plan Recurrent non-small cell lung cancer of the right lung Recurrent non-small cell lung cancer of the right lung, previously managed with two years of immunotherapy and currently under surveillance. Recent chest CT demonstrated a new 7 mm right lung lesion, radiographically and clinically favored to represent inflammation rather than malignancy. No evidence of active or progressive disease. - Reviewed recent chest CT findings with her, emphasizing the likely inflammatory etiology of the 7 mm lesion. - Provided copies of CT images and report. - Ordered repeat chest CT in six months for surveillance. - Discussed conditional plan to resume annual imaging if the six-month scan remains stable. For smoking cessation I strongly encouraged the patient to quit smoking. The patient was advised to call immediately if she has any other concerning symptoms in the interval. All questions were answered. The patient knows to call the clinic with any problems, questions or concerns. We can certainly see the patient much sooner if necessary.  Disclaimer: This note was dictated with voice recognition software. Similar sounding words can inadvertently be transcribed and may not be corrected upon  review.

## 2024-06-14 ENCOUNTER — Ambulatory Visit: Payer: Self-pay | Admitting: Internal Medicine

## 2024-06-19 ENCOUNTER — Other Ambulatory Visit: Payer: Self-pay | Admitting: Family

## 2024-06-19 DIAGNOSIS — Z1231 Encounter for screening mammogram for malignant neoplasm of breast: Secondary | ICD-10-CM

## 2024-06-25 ENCOUNTER — Encounter

## 2024-07-24 ENCOUNTER — Ambulatory Visit

## 2024-10-30 ENCOUNTER — Inpatient Hospital Stay

## 2024-11-07 ENCOUNTER — Inpatient Hospital Stay: Admitting: Internal Medicine
